# Patient Record
Sex: Male | Born: 1940 | Race: Black or African American | Hispanic: No | Marital: Married | Smoking: Never smoker
Health system: Southern US, Community
[De-identification: ages and names within clinical notes are randomized; demographics above are authoritative.]

## PROBLEM LIST (undated history)

## (undated) DIAGNOSIS — I480 Paroxysmal atrial fibrillation: Secondary | ICD-10-CM

## (undated) DIAGNOSIS — D496 Neoplasm of unspecified behavior of brain: Secondary | ICD-10-CM

## (undated) DIAGNOSIS — Z9889 Other specified postprocedural states: Secondary | ICD-10-CM

## (undated) DIAGNOSIS — Z992 Dependence on renal dialysis: Secondary | ICD-10-CM

## (undated) DIAGNOSIS — G40909 Epilepsy, unspecified, not intractable, without status epilepticus: Secondary | ICD-10-CM

## (undated) DIAGNOSIS — K219 Gastro-esophageal reflux disease without esophagitis: Secondary | ICD-10-CM

## (undated) DIAGNOSIS — C719 Malignant neoplasm of brain, unspecified: Secondary | ICD-10-CM

## (undated) DIAGNOSIS — I739 Peripheral vascular disease, unspecified: Secondary | ICD-10-CM

## (undated) DIAGNOSIS — E785 Hyperlipidemia, unspecified: Secondary | ICD-10-CM

## (undated) DIAGNOSIS — A498 Other bacterial infections of unspecified site: Secondary | ICD-10-CM

## (undated) DIAGNOSIS — N2581 Secondary hyperparathyroidism of renal origin: Secondary | ICD-10-CM

## (undated) DIAGNOSIS — I509 Heart failure, unspecified: Secondary | ICD-10-CM

## (undated) DIAGNOSIS — N186 End stage renal disease: Secondary | ICD-10-CM

## (undated) DIAGNOSIS — D649 Anemia, unspecified: Secondary | ICD-10-CM

## (undated) DIAGNOSIS — I10 Essential (primary) hypertension: Secondary | ICD-10-CM

## (undated) DIAGNOSIS — E118 Type 2 diabetes mellitus with unspecified complications: Secondary | ICD-10-CM

## (undated) DIAGNOSIS — M1990 Unspecified osteoarthritis, unspecified site: Secondary | ICD-10-CM

## (undated) HISTORY — DX: Essential (primary) hypertension: I10

## (undated) HISTORY — DX: Hyperlipidemia, unspecified: E78.5

## (undated) HISTORY — DX: Other bacterial infections of unspecified site: A49.8

## (undated) HISTORY — DX: Anemia, unspecified: D64.9

## (undated) HISTORY — DX: Peripheral vascular disease, unspecified: I73.9

## (undated) HISTORY — PX: RETINOPATHY SURGERY: SHX765

## (undated) HISTORY — DX: Neoplasm of unspecified behavior of brain: D49.6

## (undated) HISTORY — DX: Unspecified osteoarthritis, unspecified site: M19.90

## (undated) HISTORY — DX: Other specified postprocedural states: Z98.890

## (undated) HISTORY — DX: Paroxysmal atrial fibrillation: I48.0

## (undated) HISTORY — PX: AV FISTULA PLACEMENT: SHX1204

## (undated) HISTORY — DX: Epilepsy, unspecified, not intractable, without status epilepticus: G40.909

## (undated) HISTORY — DX: Malignant neoplasm of brain, unspecified: C71.9

## (undated) HISTORY — DX: Heart failure, unspecified: I50.9

---

## 1998-09-15 HISTORY — PX: EYE SURGERY: SHX253

## 2006-12-16 ENCOUNTER — Other Ambulatory Visit: Payer: Self-pay

## 2006-12-16 ENCOUNTER — Inpatient Hospital Stay: Payer: Self-pay | Admitting: *Deleted

## 2007-07-28 ENCOUNTER — Ambulatory Visit: Payer: Self-pay | Admitting: Ophthalmology

## 2007-07-28 ENCOUNTER — Other Ambulatory Visit: Payer: Self-pay

## 2007-07-29 ENCOUNTER — Ambulatory Visit: Payer: Self-pay | Admitting: Cardiology

## 2007-08-04 ENCOUNTER — Ambulatory Visit: Payer: Self-pay | Admitting: Ophthalmology

## 2007-09-16 DIAGNOSIS — G40909 Epilepsy, unspecified, not intractable, without status epilepticus: Secondary | ICD-10-CM

## 2007-09-16 DIAGNOSIS — D496 Neoplasm of unspecified behavior of brain: Secondary | ICD-10-CM

## 2007-09-16 DIAGNOSIS — C719 Malignant neoplasm of brain, unspecified: Secondary | ICD-10-CM

## 2007-09-16 HISTORY — DX: Epilepsy, unspecified, not intractable, without status epilepticus: G40.909

## 2007-09-16 HISTORY — DX: Neoplasm of unspecified behavior of brain: D49.6

## 2007-09-16 HISTORY — DX: Malignant neoplasm of brain, unspecified: C71.9

## 2007-09-16 HISTORY — PX: CRANIECTOMY / CRANIOTOMY FOR EXCISION OF BRAIN TUMOR: SUR320

## 2007-10-02 ENCOUNTER — Other Ambulatory Visit: Payer: Self-pay

## 2007-10-03 ENCOUNTER — Inpatient Hospital Stay: Payer: Self-pay | Admitting: Internal Medicine

## 2010-09-15 DIAGNOSIS — N186 End stage renal disease: Secondary | ICD-10-CM | POA: Diagnosis present

## 2010-12-15 HISTORY — PX: BELOW KNEE LEG AMPUTATION: SUR23

## 2012-08-26 ENCOUNTER — Encounter: Payer: Self-pay | Admitting: Vascular Surgery

## 2012-08-31 ENCOUNTER — Encounter: Payer: Self-pay | Admitting: Vascular Surgery

## 2012-09-01 ENCOUNTER — Encounter: Payer: Self-pay | Admitting: Vascular Surgery

## 2012-09-01 ENCOUNTER — Other Ambulatory Visit (HOSPITAL_COMMUNITY): Payer: Self-pay | Admitting: Nephrology

## 2012-09-01 ENCOUNTER — Ambulatory Visit (INDEPENDENT_AMBULATORY_CARE_PROVIDER_SITE_OTHER): Payer: PRIVATE HEALTH INSURANCE | Admitting: Vascular Surgery

## 2012-09-01 VITALS — BP 123/71 | HR 82 | Resp 16 | Ht 76.0 in | Wt 256.0 lb

## 2012-09-01 DIAGNOSIS — N186 End stage renal disease: Secondary | ICD-10-CM

## 2012-09-01 DIAGNOSIS — I70269 Atherosclerosis of native arteries of extremities with gangrene, unspecified extremity: Secondary | ICD-10-CM

## 2012-09-01 NOTE — Progress Notes (Signed)
Vascular and Vein Specialist of [facility]  Patient name: [name] [name] MRN: [id number] DOB: June 23, 1941 Sex: male  REASON FOR CONSULT: right second toe wound.  HPI: [name] [name] is a 71 y.o. male who presents with a wound on his right second toe. He was referred for vascular consultation. 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 He apparently had osteomyelitis of the left foot. He does not remember any specific injury to the right second toe. He denies any fever or chills. He's had no drainage from the wound. He does state that he had an x-ray approximately 2 months ago which did not show any evidence of osteomyelitis. He is ambulatory with a below the knee prosthesis. He denies any significant claudication on the right and denies any rest pain or previous history of nonhealing ulcers on the right.  Past Medical History  Diagnosis Date  . Dialysis patient   . Peripheral vascular disease   . ESRD (end stage renal disease) on dialysis   . Hypertension   . Hyperlipidemia   . Arthritis     Gout  . Paroxysmal atrial fibrillation   . Brain tumor 2009  . Seizure disorder 2009  . Anemia   . Thyroid disease     hyperparathyroidism, secondary  . Diabetes mellitus without complication     Type II  . Proteus mirabilis infection   . CHF (congestive heart failure)   . CAD (coronary artery disease)   . 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 brain tumor    Family History  Problem Relation Age of Onset  . Seizures Mother   . Transient ischemic attack Mother   . Hypertension Mother   . Alcohol abuse Father   . Hypertension Father   . Arthritis Sister     SOCIAL HISTORY: History  Substance Use Topics  . Smoking status: Never Smoker   . Smokeless tobacco: Never Used  . Alcohol Use: 0.6 oz/week    1 Shots of liquor per week    No Known Allergies  Current Outpatient Prescriptions  Medication Sig  Dispense Refill  . phenytoin (DILANTIN) 50 MG tablet Chew 50 mg by mouth 3 (three) times daily.      Marland Kitchen amLODipine (NORVASC) 10 MG tablet Take 10 mg by mouth daily.      Marland Kitchen aspirin 81 MG tablet Take 81 mg by mouth daily.      . Calcium Acetate-Magnesium Carb 450-200 MG TABS Take by mouth.      . carvedilol (COREG) 12.5 MG tablet Take 12.5 mg by mouth 2 (two) times daily with a meal.      . cyclobenzaprine (FLEXERIL) 5 MG tablet Take 5 mg by mouth 3 (three) times daily as needed.      Marland Kitchen FOLIC ACID PO Take 1 mg by mouth.      . insulin NPH-insulin regular (NOVOLIN 50/50) (50-50) 100 UNIT/ML injection Inject into the skin 2 (two) times daily before a meal.      . lisinopril (PRINIVIL,ZESTRIL) 10 MG tablet Take 10 mg by mouth daily.      . multivitamin (RENA-VIT) TABS tablet Take 1 tablet by mouth daily.      . pantoprazole (PROTONIX) 40 MG tablet Take 40 mg by mouth daily.        REVIEW OF SYSTEMS: Arly.Keller ] denotes positive finding; [  ] denotes negative finding  CARDIOVASCULAR:  [ ]  chest pain   [ ]   chest pressure   [ ]  palpitations   [ ]  orthopnea   [ ]  dyspnea on exertion   [ ]  claudication   [ ]  rest pain   [ ]  DVT   [ ]  phlebitis PULMONARY:   [ ]  productive cough   [ ]  asthma   [ ]  wheezing NEUROLOGIC:   [ ]  weakness  [ ]  paresthesias  [ ]  aphasia  [ ]  amaurosis  [ ]  dizziness HEMATOLOGIC:   [ ]  bleeding problems   [ ]  clotting disorders MUSCULOSKELETAL:  [ ]  joint pain   [ ]  joint swelling Arly.[email or web address] ] leg swelling right leg GASTROINTESTINAL: [ ]   blood in stool  [ ]   hematemesis GENITOURINARY:  [ ]   dysuria  [ ]   hematuria PSYCHIATRIC:  [ ]  history of major depression INTEGUMENTARY:  [ ]  rashes  [ ]  ulcers CONSTITUTIONAL:  [ ]  fever   [ ]  chills  PHYSICAL EXAM: Filed Vitals:   09/01/12 1127  BP: 123/71  Pulse: 82  Resp: 16  Height: 6\' 4"  (1.93 m)  Weight: 255 lb 15.3 oz (116.1 kg)  SpO2: 100%   Body mass index is 31.16 kg/(m^2). GENERAL: The patient is a well-nourished male, in no acute  distress. The vital signs are documented above. CARDIOVASCULAR: There is a regular rate and rhythm. I do not detect carotid bruits. He has palpable femoral pulses. He has a palpable right popliteal pulse. I cannot palpate pedal pulses on the right. His left upper arm fistula has a bruit and thrill. He has a diminished but palpable left radial pulse. PULMONARY: There is good air exchange bilaterally without wheezing or rales. ABDOMEN: Soft and non-tender with normal pitched bowel sounds.  MUSCULOSKELETAL: he has a prosthesis on his left below the knee amputation site. NEUROLOGIC: No focal weakness or paresthesias are detected. SKIN: there is a superficial ulcer on the dorsum of the right second toe. There is no significant erythema or drainage noted. PSYCHIATRIC: The patient has a normal affect.  DATA:  I have reviewed his Doppler studies which were done in November in [facility]. This showed triphasic waveforms throughout the entire right lower extremity including the anterior tibial and posterior tibial arteries. He had calcific vessels and therefore ABIs were not obtainable however toe pressure on the right was 83 mmHg.  I have also reviewed his records from [facility]. He has end-stage renal disease and dialyzes on Tuesdays Thursdays and Saturdays. He scheduled for a fistulogram to a value in his left upper arm fistula in the near future. Also has a history of paroxysmal atrial for ablation and is on low-dose aspirin.  MEDICAL ISSUES: This patient has a superficial wound on the right second toe. Currently there is no significant erythema or drainage to suggest underlying osteomyelitis. Based on his Doppler study in November he should have adequate circulation to heal this wound. Typically, even in a patient with diabetes, a toe pressure of greater than 50 is adequate for healing. This toe pressure on the right is 83 mmHg. In addition he has triphasic Doppler signals in  the anterior tibial and posterior tibial arteries on the right. I would continue with aggressive wound care. At this point I would not recommend arteriography given that I think he has adequate circulation to heal this wound. I be happy to see him back if there is any change in the wound her failure to heal. Fortunately he is not a smoker.   Morrigan Wickens S [facility] Specialists of [facility] [facility]: [phone]

## 2012-09-03 ENCOUNTER — Other Ambulatory Visit (HOSPITAL_COMMUNITY): Payer: Self-pay | Admitting: Nephrology

## 2012-09-03 ENCOUNTER — Ambulatory Visit (HOSPITAL_COMMUNITY)
Admission: RE | Admit: 2012-09-03 | Discharge: 2012-09-03 | Disposition: A | Discharge status: Home / Self Care | Payer: Medicare Other | Source: Ambulatory Visit | Attending: Nephrology | Admitting: Nephrology

## 2012-09-03 DIAGNOSIS — Z992 Dependence on renal dialysis: Secondary | ICD-10-CM | POA: Insufficient documentation

## 2012-09-03 DIAGNOSIS — I2510 Atherosclerotic heart disease of native coronary artery without angina pectoris: Secondary | ICD-10-CM | POA: Insufficient documentation

## 2012-09-03 DIAGNOSIS — Y849 Medical procedure, unspecified as the cause of abnormal reaction of the patient, or of later complication, without mention of misadventure at the time of the procedure: Secondary | ICD-10-CM | POA: Insufficient documentation

## 2012-09-03 DIAGNOSIS — I120 Hypertensive chronic kidney disease with stage 5 chronic kidney disease or end stage renal disease: Secondary | ICD-10-CM | POA: Insufficient documentation

## 2012-09-03 DIAGNOSIS — T82898A Other specified complication of vascular prosthetic devices, implants and grafts, initial encounter: Secondary | ICD-10-CM | POA: Insufficient documentation

## 2012-09-03 DIAGNOSIS — I4891 Unspecified atrial fibrillation: Secondary | ICD-10-CM | POA: Insufficient documentation

## 2012-09-03 DIAGNOSIS — N186 End stage renal disease: Secondary | ICD-10-CM

## 2012-09-03 DIAGNOSIS — E785 Hyperlipidemia, unspecified: Secondary | ICD-10-CM | POA: Insufficient documentation

## 2012-09-03 DIAGNOSIS — E119 Type 2 diabetes mellitus without complications: Secondary | ICD-10-CM | POA: Insufficient documentation

## 2012-09-03 MED ORDER — IOHEXOL 300 MG/ML  SOLN
100.0000 mL | Freq: Once | INTRAMUSCULAR | Status: AC | PRN
  Administered 2012-09-03: 60 mL via INTRAVENOUS

## 2012-09-03 NOTE — Procedures (Signed)
Successful balloon angioplasty of left cephalic vein stenosis.  No immediate complication.  See Radiology report.

## 2012-09-03 NOTE — H&P (Signed)
[name] [name] is an 71 y.o. male.   Chief Complaint: Poor flows from left arm dialysis fistula HPI: ESRD and poor flows from left arm fistula.  No complaints.  Here for fistulogram and treatment.  Past Medical History  Diagnosis Date  . Dialysis patient   . Peripheral vascular disease   . ESRD (end stage renal disease) on dialysis   . Hypertension   . Hyperlipidemia   . Arthritis     Gout  . Paroxysmal atrial fibrillation   . Brain tumor 2009  . Seizure disorder 2009  . Anemia   . Thyroid disease     hyperparathyroidism, secondary  . Diabetes mellitus without complication     Type II  . Proteus mirabilis infection   . CHF (congestive heart failure)   . CAD (coronary artery disease)   . 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 brain tumor    Past Surgical History  Procedure Date  . Av fistula placement   . Below knee leg amputation April 2012    Left below knee amputation for Osteomyelitis  . Eye surgery     cataract  . Retinopathy surgery     Hx. of laser treatments for diabetics    Family History  Problem Relation Age of Onset  . Seizures Mother   . Transient ischemic attack Mother   . Hypertension Mother   . Alcohol abuse Father   . Hypertension Father   . Arthritis Sister    Social History:  reports that he has never smoked. He has never used smokeless tobacco. He reports that he drinks about .6 ounces of alcohol per week. He reports that he does not use illicit drugs.  Allergies: No Known Allergies  Filed Vitals:   09/03/12 1300  BP: 133/75  Pulse: 77  Resp: 19    Review of Systems  Constitutional: Negative.   Respiratory: Negative.   Cardiovascular: Negative.   Gastrointestinal: Negative.   Genitourinary: Negative.     Blood pressure 133/75, pulse 77, resp. rate 19, SpO2 99.00%. Physical Exam  Constitutional: He appears well-developed.  Respiratory: Effort normal and breath sounds normal.   Heart:  Irregular heart rate consistent with atrial fibrillation. Left arm:  Fistula is patent.    Imaging:  Focal venous stenosis in upper cephalic vein.   Assessment/Plan Fistulogram demonstrated focal narrowing in left upper cephalic vein.  Discussed angioplasty and possible stent placement with patient.  Informed consent was obtained.  Plan for intervention without sedation.  Cheyenna Pankowski RYAN 09/03/2012, 2:32 PM

## 2012-09-06 ENCOUNTER — Telehealth (HOSPITAL_COMMUNITY): Payer: Self-pay | Admitting: *Deleted

## 2012-12-17 ENCOUNTER — Other Ambulatory Visit (HOSPITAL_COMMUNITY): Payer: Self-pay | Admitting: Nephrology

## 2012-12-17 DIAGNOSIS — N186 End stage renal disease: Secondary | ICD-10-CM

## 2012-12-22 ENCOUNTER — Ambulatory Visit (HOSPITAL_COMMUNITY): Admission: RE | Admit: 2012-12-22 | Payer: PRIVATE HEALTH INSURANCE | Source: Ambulatory Visit

## 2012-12-24 ENCOUNTER — Other Ambulatory Visit (HOSPITAL_COMMUNITY): Payer: Self-pay | Admitting: Diagnostic Radiology

## 2012-12-24 ENCOUNTER — Ambulatory Visit (HOSPITAL_COMMUNITY)
Admission: RE | Admit: 2012-12-24 | Discharge: 2012-12-24 | Disposition: A | Discharge status: Home / Self Care | Payer: Medicare Other | Source: Ambulatory Visit | Attending: Nephrology | Admitting: Nephrology

## 2012-12-24 ENCOUNTER — Other Ambulatory Visit (HOSPITAL_COMMUNITY): Payer: Self-pay | Admitting: Nephrology

## 2012-12-24 DIAGNOSIS — T82898A Other specified complication of vascular prosthetic devices, implants and grafts, initial encounter: Secondary | ICD-10-CM | POA: Insufficient documentation

## 2012-12-24 DIAGNOSIS — E785 Hyperlipidemia, unspecified: Secondary | ICD-10-CM | POA: Insufficient documentation

## 2012-12-24 DIAGNOSIS — I120 Hypertensive chronic kidney disease with stage 5 chronic kidney disease or end stage renal disease: Secondary | ICD-10-CM | POA: Insufficient documentation

## 2012-12-24 DIAGNOSIS — Y832 Surgical operation with anastomosis, bypass or graft as the cause of abnormal reaction of the patient, or of later complication, without mention of misadventure at the time of the procedure: Secondary | ICD-10-CM | POA: Insufficient documentation

## 2012-12-24 DIAGNOSIS — N186 End stage renal disease: Secondary | ICD-10-CM

## 2012-12-24 DIAGNOSIS — Z794 Long term (current) use of insulin: Secondary | ICD-10-CM | POA: Insufficient documentation

## 2012-12-24 DIAGNOSIS — G40909 Epilepsy, unspecified, not intractable, without status epilepticus: Secondary | ICD-10-CM | POA: Insufficient documentation

## 2012-12-24 DIAGNOSIS — I509 Heart failure, unspecified: Secondary | ICD-10-CM | POA: Insufficient documentation

## 2012-12-24 DIAGNOSIS — E119 Type 2 diabetes mellitus without complications: Secondary | ICD-10-CM | POA: Insufficient documentation

## 2012-12-24 DIAGNOSIS — I2510 Atherosclerotic heart disease of native coronary artery without angina pectoris: Secondary | ICD-10-CM | POA: Insufficient documentation

## 2012-12-24 DIAGNOSIS — N2581 Secondary hyperparathyroidism of renal origin: Secondary | ICD-10-CM | POA: Insufficient documentation

## 2012-12-24 DIAGNOSIS — I82619 Acute embolism and thrombosis of superficial veins of unspecified upper extremity: Secondary | ICD-10-CM | POA: Insufficient documentation

## 2012-12-24 DIAGNOSIS — I739 Peripheral vascular disease, unspecified: Secondary | ICD-10-CM | POA: Insufficient documentation

## 2012-12-24 DIAGNOSIS — Z79899 Other long term (current) drug therapy: Secondary | ICD-10-CM | POA: Insufficient documentation

## 2012-12-24 DIAGNOSIS — S88119A Complete traumatic amputation at level between knee and ankle, unspecified lower leg, initial encounter: Secondary | ICD-10-CM | POA: Insufficient documentation

## 2012-12-24 DIAGNOSIS — D649 Anemia, unspecified: Secondary | ICD-10-CM | POA: Insufficient documentation

## 2012-12-24 DIAGNOSIS — Z7982 Long term (current) use of aspirin: Secondary | ICD-10-CM | POA: Insufficient documentation

## 2012-12-24 MED ORDER — IOHEXOL 300 MG/ML  SOLN
100.0000 mL | Freq: Once | INTRAMUSCULAR | Status: AC | PRN
  Administered 2012-12-24: 8 mL via INTRAVENOUS

## 2012-12-24 NOTE — H&P (Signed)
[name] [name] is an 72 y.o. male.   Chief Complaint: decreased access flows/clotted left arm dialysis fistula HPI: Patient with ESRD and left arm fistula with thrombosis of venous limb noted on 12/24/12 fistulagram . He is now scheduled for thrombolysis with possible angioplasty/stenting of fistula or placement of new catheter if needed on 12/25/12.  Past Medical History  Diagnosis Date  . Dialysis patient   . Peripheral vascular disease   . ESRD (end stage renal disease) on dialysis   . Hypertension   . Hyperlipidemia   . Arthritis     Gout  . Paroxysmal atrial fibrillation   . Brain tumor 2009  . Seizure disorder 2009  . Anemia   . Thyroid disease     hyperparathyroidism, secondary  . Diabetes mellitus without complication     Type II  . Proteus mirabilis infection   . CHF (congestive heart failure)   . CAD (coronary artery disease)   . 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 brain tumor  [employer]H'S WITNESS  Past Surgical History  Procedure Laterality Date  . Av fistula placement    . Below knee leg amputation  April 2012    Left below knee amputation for Osteomyelitis  . Eye surgery      cataract  . Retinopathy surgery      Hx. of laser treatments for diabetics    Family History  Problem Relation Age of Onset  . Seizures Mother   . Transient ischemic attack Mother   . Hypertension Mother   . Alcohol abuse Father   . Hypertension Father   . Arthritis Sister    Social History:  reports that he has never smoked. He has never used smokeless tobacco. He reports that he drinks about 0.6 ounces of alcohol per week. He reports that he does not use illicit drugs.  Allergies: No Known Allergies Current outpatient prescriptions:amLODipine (NORVASC) 10 MG tablet, Take 10 mg by mouth daily., Disp: , Rfl: ;  aspirin 81 MG tablet, Take 81 mg by mouth daily., Disp: , Rfl: ;  Calcium Acetate-Magnesium Carb 450-200 MG TABS, Take by  mouth., Disp: , Rfl: ;  carvedilol (COREG) 12.5 MG tablet, Take 12.5 mg by mouth 2 (two) times daily with a meal., Disp: , Rfl:  cyclobenzaprine (FLEXERIL) 5 MG tablet, Take 5 mg by mouth 3 (three) times daily as needed., Disp: , Rfl: ;  FOLIC ACID PO, Take 1 mg by mouth., Disp: , Rfl: ;  insulin NPH-insulin regular (NOVOLIN 50/50) (50-50) 100 UNIT/ML injection, Inject into the skin 2 (two) times daily before a meal., Disp: , Rfl: ;  lisinopril (PRINIVIL,ZESTRIL) 10 MG tablet, Take 10 mg by mouth daily., Disp: , Rfl:  multivitamin (RENA-VIT) TABS tablet, Take 1 tablet by mouth daily., Disp: , Rfl: ;  pantoprazole (PROTONIX) 40 MG tablet, Take 40 mg by mouth daily., Disp: , Rfl: ;  phenytoin (DILANTIN) 50 MG tablet, Chew 50 mg by mouth 3 (three) times daily., Disp: , Rfl:     No results found for this or any previous visit (from the past 48 hour(s)). No results found.  Review of Systems  Constitutional: Negative for fever and chills.  Respiratory: Negative for cough and shortness of breath.   Cardiovascular: Negative for chest pain.  Gastrointestinal: Negative for nausea, vomiting and abdominal pain.  Musculoskeletal: Negative for back pain.  Neurological: Negative for headaches.  Endo/Heme/Allergies: Does not bruise/bleed easily.    There were  no vitals taken for this visit. Physical Exam  Constitutional: He is oriented to person, place, and time. He appears well-developed and well-nourished.  Cardiovascular:  irreg  irreg; left arm AVF with positive thrill/bruit proximal portion ( arterial anast patent)   Respiratory: Effort normal.  Few bibasilar crackles  GI: Soft. Bowel sounds are normal. There is no tenderness.  Musculoskeletal: He exhibits no edema.  Left BKA  Neurological: He is alert and oriented to person, place, and time.     Assessment/Plan Pt with ESRD and thrombosed venous limb of left arm AVF. Plan is for thrombolysis, possible angioplasty/stenting of fistula or  placement of new catheter if needed on 12/25/12. Details/risks of procedure d/w pt/wife with their understanding and consent. Pt is a Jehovah's witness.   Nicodemus Denk,D KEVIN 12/24/2012, 3:34 PM

## 2012-12-25 ENCOUNTER — Other Ambulatory Visit (HOSPITAL_COMMUNITY): Payer: Self-pay | Admitting: Diagnostic Radiology

## 2012-12-25 ENCOUNTER — Ambulatory Visit (HOSPITAL_COMMUNITY)
Admission: RE | Admit: 2012-12-25 | Discharge: 2012-12-25 | Disposition: A | Discharge status: Home / Self Care | Payer: Medicare Other | Source: Ambulatory Visit | Attending: Diagnostic Radiology | Admitting: Diagnostic Radiology

## 2012-12-25 ENCOUNTER — Other Ambulatory Visit (HOSPITAL_COMMUNITY): Payer: Self-pay | Admitting: Nephrology

## 2012-12-25 ENCOUNTER — Other Ambulatory Visit (HOSPITAL_COMMUNITY): Payer: PRIVATE HEALTH INSURANCE

## 2012-12-25 DIAGNOSIS — I82619 Acute embolism and thrombosis of superficial veins of unspecified upper extremity: Secondary | ICD-10-CM | POA: Insufficient documentation

## 2012-12-25 DIAGNOSIS — N186 End stage renal disease: Secondary | ICD-10-CM

## 2012-12-25 DIAGNOSIS — T82898A Other specified complication of vascular prosthetic devices, implants and grafts, initial encounter: Secondary | ICD-10-CM | POA: Insufficient documentation

## 2012-12-25 DIAGNOSIS — I871 Compression of vein: Secondary | ICD-10-CM | POA: Insufficient documentation

## 2012-12-25 DIAGNOSIS — Y832 Surgical operation with anastomosis, bypass or graft as the cause of abnormal reaction of the patient, or of later complication, without mention of misadventure at the time of the procedure: Secondary | ICD-10-CM | POA: Insufficient documentation

## 2012-12-25 LAB — POTASSIUM: Potassium: 3.8 mEq/L (ref 3.5–5.1)

## 2012-12-25 MED ORDER — HEPARIN SODIUM (PORCINE) 1000 UNIT/ML IJ SOLN
INTRAMUSCULAR | Status: AC
  Administered 2012-12-25: 5000 [IU]
  Filled 2012-12-25: qty 1

## 2012-12-25 MED ORDER — MIDAZOLAM HCL 2 MG/2ML IJ SOLN
INTRAMUSCULAR | Status: AC | PRN
  Administered 2012-12-25 (×3): 1 mg via INTRAVENOUS

## 2012-12-25 MED ORDER — FENTANYL CITRATE 0.05 MG/ML IJ SOLN
INTRAMUSCULAR | Status: AC
  Filled 2012-12-25: qty 2

## 2012-12-25 MED ORDER — FENTANYL CITRATE 0.05 MG/ML IJ SOLN
INTRAMUSCULAR | Status: AC | PRN
  Administered 2012-12-25 (×4): 25 ug via INTRAVENOUS

## 2012-12-25 MED ORDER — IOHEXOL 300 MG/ML  SOLN
100.0000 mL | Freq: Once | INTRAMUSCULAR | Status: AC | PRN
  Administered 2012-12-25: 100 mL via INTRAVENOUS

## 2012-12-25 MED ORDER — MIDAZOLAM HCL 2 MG/2ML IJ SOLN
INTRAMUSCULAR | Status: AC
  Filled 2012-12-25: qty 2

## 2012-12-25 MED ORDER — ALTEPLASE 100 MG IV SOLR
2.0000 mg | Freq: Once | INTRAVENOUS | Status: AC
  Administered 2012-12-25: 2 mg
  Filled 2012-12-25 (×2): qty 2

## 2012-12-25 NOTE — Procedures (Signed)
Successful declot of the left upper arm fistula.  See radiology report.  No immediate complication.

## 2012-12-29 ENCOUNTER — Ambulatory Visit (HOSPITAL_COMMUNITY): Payer: PRIVATE HEALTH INSURANCE

## 2013-01-05 ENCOUNTER — Ambulatory Visit: Payer: Medicare Other | Admitting: Physical Therapy

## 2013-01-05 ENCOUNTER — Ambulatory Visit: Payer: Medicare Other | Attending: Nephrology | Admitting: Occupational Therapy

## 2013-01-05 ENCOUNTER — Ambulatory Visit: Payer: Medicare Other | Admitting: Rehabilitative and Restorative Service Providers"

## 2013-01-05 DIAGNOSIS — R279 Unspecified lack of coordination: Secondary | ICD-10-CM | POA: Insufficient documentation

## 2013-01-05 DIAGNOSIS — Z5189 Encounter for other specified aftercare: Secondary | ICD-10-CM | POA: Insufficient documentation

## 2013-01-05 DIAGNOSIS — R269 Unspecified abnormalities of gait and mobility: Secondary | ICD-10-CM | POA: Insufficient documentation

## 2013-01-05 DIAGNOSIS — R5381 Other malaise: Secondary | ICD-10-CM | POA: Insufficient documentation

## 2013-01-07 ENCOUNTER — Ambulatory Visit: Payer: Medicare Other | Admitting: *Deleted

## 2013-01-10 ENCOUNTER — Ambulatory Visit: Payer: PRIVATE HEALTH INSURANCE | Admitting: Internal Medicine

## 2013-01-10 DIAGNOSIS — Z0289 Encounter for other administrative examinations: Secondary | ICD-10-CM

## 2013-01-12 ENCOUNTER — Ambulatory Visit: Payer: PRIVATE HEALTH INSURANCE | Admitting: Rehabilitative and Restorative Service Providers"

## 2013-01-12 ENCOUNTER — Encounter: Payer: PRIVATE HEALTH INSURANCE | Admitting: Occupational Therapy

## 2013-01-12 ENCOUNTER — Ambulatory Visit: Payer: Medicare Other | Admitting: Physical Therapy

## 2013-01-14 ENCOUNTER — Ambulatory Visit: Payer: Medicare Other | Admitting: Occupational Therapy

## 2013-01-14 ENCOUNTER — Encounter: Payer: Self-pay | Admitting: Physical Therapy

## 2013-01-17 ENCOUNTER — Ambulatory Visit: Payer: Medicare Other | Attending: Nephrology | Admitting: Physical Therapy

## 2013-01-17 DIAGNOSIS — R279 Unspecified lack of coordination: Secondary | ICD-10-CM | POA: Insufficient documentation

## 2013-01-17 DIAGNOSIS — R5381 Other malaise: Secondary | ICD-10-CM | POA: Insufficient documentation

## 2013-01-17 DIAGNOSIS — R269 Unspecified abnormalities of gait and mobility: Secondary | ICD-10-CM | POA: Insufficient documentation

## 2013-01-17 DIAGNOSIS — Z5189 Encounter for other specified aftercare: Secondary | ICD-10-CM | POA: Insufficient documentation

## 2013-01-21 ENCOUNTER — Ambulatory Visit: Payer: Medicare Other | Admitting: *Deleted

## 2013-01-24 ENCOUNTER — Ambulatory Visit: Payer: Medicare Other | Admitting: Physical Therapy

## 2013-01-28 ENCOUNTER — Encounter: Payer: Self-pay | Admitting: Physical Therapy

## 2013-01-28 ENCOUNTER — Ambulatory Visit: Payer: Medicare Other | Admitting: Physical Therapy

## 2013-01-31 ENCOUNTER — Ambulatory Visit: Payer: Medicare Other | Admitting: Physical Therapy

## 2013-02-01 ENCOUNTER — Telehealth: Payer: Self-pay

## 2013-02-01 NOTE — Telephone Encounter (Signed)
Message copied by Doree Barthel on Tue Feb 01, 2013  1:50 PM ------      Message from: Janey Greaser      Created: Mon Jan 17, 2013  3:56 PM      Contact: PT CAME TO OFFICE                   ----- Message -----         From: Esmond Harps         Sent: 01/17/2013   3:10 PM           To: [facility] Clinical Pool            PT HAS APPT WITH DR. Marjory Lies 05-02-13 BUT NEEDS SOONER APPT TO DISCUSS MRI RESULTS--[phone]) ------

## 2013-02-01 NOTE — Telephone Encounter (Signed)
Pt requesting office visit to go over MRI's previously done @ [facility].

## 2013-02-02 NOTE — Telephone Encounter (Signed)
Called pt to sched OV on  02/09/13 @ 12pm. No answer, left vmail appt scheduled.

## 2013-02-04 ENCOUNTER — Ambulatory Visit: Payer: Medicare Other | Admitting: Physical Therapy

## 2013-02-09 ENCOUNTER — Telehealth: Payer: Self-pay

## 2013-02-09 ENCOUNTER — Encounter: Payer: Self-pay | Admitting: Diagnostic Neuroimaging

## 2013-02-09 ENCOUNTER — Ambulatory Visit (INDEPENDENT_AMBULATORY_CARE_PROVIDER_SITE_OTHER): Payer: Medicare Other | Admitting: Diagnostic Neuroimaging

## 2013-02-09 VITALS — BP 143/76 | HR 73 | Temp 100.4°F | Ht 76.0 in | Wt 256.0 lb

## 2013-02-09 DIAGNOSIS — G40909 Epilepsy, unspecified, not intractable, without status epilepticus: Secondary | ICD-10-CM

## 2013-02-09 DIAGNOSIS — C712 Malignant neoplasm of temporal lobe: Secondary | ICD-10-CM

## 2013-02-09 DIAGNOSIS — G40209 Localization-related (focal) (partial) symptomatic epilepsy and epileptic syndromes with complex partial seizures, not intractable, without status epilepticus: Secondary | ICD-10-CM

## 2013-02-09 NOTE — Telephone Encounter (Signed)
Noted. Will follow up dilantin level. -VRP

## 2013-02-09 NOTE — Patient Instructions (Signed)
Will check labs and then consider medication adjustment.  I will check EEG.

## 2013-02-09 NOTE — Telephone Encounter (Signed)
Pt says rx directions are Dilantin 50mg  6 tabs in the a.m; and 5 tabs in evening.  He has been taking 10 tabs in a.m.

## 2013-02-10 ENCOUNTER — Telehealth: Payer: Self-pay | Admitting: Diagnostic Neuroimaging

## 2013-02-10 DIAGNOSIS — G40209 Localization-related (focal) (partial) symptomatic epilepsy and epileptic syndromes with complex partial seizures, not intractable, without status epilepticus: Secondary | ICD-10-CM | POA: Insufficient documentation

## 2013-02-10 LAB — COMPREHENSIVE METABOLIC PANEL
Albumin: 4.2 g/dL (ref 3.5–4.8)
BUN: 25 mg/dL (ref 8–27)
CO2: 28 mmol/L (ref 19–28)
Calcium: 9.4 mg/dL (ref 8.6–10.2)
Chloride: 95 mmol/L — ABNORMAL LOW (ref 96–108)
Glucose: 137 mg/dL — ABNORMAL HIGH (ref 65–99)
Total Protein: 6.8 g/dL (ref 6.0–8.5)

## 2013-02-10 LAB — CBC WITH DIFFERENTIAL/PLATELET
Eosinophils Absolute: 0.2 10*3/uL (ref 0.0–0.4)
MCH: 31.8 pg (ref 26.6–33.0)
MCV: 93 fL (ref 79–97)
Monocytes Absolute: 0.9 10*3/uL (ref 0.1–0.9)
Neutrophils Relative %: 54 % (ref 40–74)
RDW: 13.7 % (ref 12.3–15.4)
WBC: 8 10*3/uL (ref 3.4–10.8)

## 2013-02-10 MED ORDER — DIVALPROEX SODIUM ER 500 MG PO TB24: 1000.0000 mg | ORAL_TABLET | Freq: Every day | ORAL | Status: DC

## 2013-02-10 NOTE — Telephone Encounter (Signed)
I called pts wife. He is taking dilantin 50mg  tabs (10 tabs every morning = 500mg  daily), but level is only 2.4.   Therefore, I will start divalproex ER 500mg  daily x 1 week, then increase to 1000mg  daily. At the same time, he is to reduce dilantin from 500mg  to 250mg  daily x 1 week, then reduce by 50mg  per week until dilantin is stopped.  Pts wife understands plan.    Suanne Marker, MD 02/10/2013, 10:54 AM Certified in Neurology, Neurophysiology and Neuroimaging  [facility] [address] [place], [place] [address] [phone]

## 2013-02-10 NOTE — Progress Notes (Signed)
[facility]  PATIENT: [name] [name] DOB: 03/31/1941  REFERRING CLINICIAN:  HISTORY FROM: patient and wife REASON FOR VISIT: follow up   HISTORICAL  CHIEF COMPLAINT:  Chief Complaint  Patient presents with  . Follow-up    would like to discuss MRI    HISTORY OF PRESENT ILLNESS:   UPDATE 02/09/13: Since last visit, still having 3-4 episodes per week, with slurred speech, random jerking movements, but cannot talk, but able to hear and stay awake. Episodes do not seem to be correlated with dialysis timing. Patient did not bring his medicines for me to review. Is not sure about his Dilantin dosing, but he thinks he takes 50 mg tablets, 10 tablets every morning. Also he went to [facility] and had follow up MRI brain on 11/10/12, which shows stable right temporal post surgical changes, and is stable from MRI on 01/07/11. He did not see them in clinic.  Also, I received prior records, which I summarize as follows: Patient had 2 focal seizures in January 2009. These initially consisted of feeling off balance, disorientation, difficulty speaking, left-sided numbness. He underwent stereotactic biopsy on (10/11/07, Dr. Velora Heckler, [facility]), demonstrating a well-differentiated astrocytoma.  Patient was then referred to [facility] for definitive treatment. He underwent right temporal lobe tumor resection (11/09/07, Dr. Garnette Czech, [facility]) and pathology demonstrated oligoastrocytoma. He then followed up at [facility] tumor [facility] on 12/29/07.  Additional pathology studies: Ki-67 4%, MGMT 25% (MGMT promoter methylation negative), 1p intact, 19q loss. No further radiation or chemotherapy was suggested. Followup MRIs were recommended over time. He follow up in 2010 multiple times with stable MRIs, then not again in [facility] clinic until 07/15/12. Last 2 MRIs were 01/07/11 and 11/10/12, which have been stable. Has not been back to [facility] brain tumor center for clinic evaluation since 07/15/12.  PRIOR HPI  (10/22/12): 72 year old right-handed male with history of diabetes, paroxysmal atrial fibrillation, gout, end-stage renal disease on hemodialysis, depression, anxiety, brain tumor (patient not sure what type) here for evaluation of slurred speech trouble talking, trouble swallowing and being in the ears. Patient is here with his wife.  Patient previously lived in [facility], treated at [facility] for a brain tumor. He points to the right frontal region of his head. Is not sure if this was a benign or malignant tumor. Patient states that he was having left leg weakness problems, diagnosed with a brain tumor. Apparently this was treated at [facility] with biopsy and ultimately resection. He does not recall chemotherapy or radiation treatments. He has had followup scans and visits at [facility] with neurosurgery and neurology but does not know with whom. Last MRI scan was in April 2012. Last visit with neurologist was 4 months ago.  Patient also reports history of seizures, around the time of diagnosis of brain tumor. Patient describes seizures as episodes where he is unable to talk, foaming at the mouth, but awake, aware, able to hear. No convulsions. Patient has been treated with Dilantin. He reports taking 10 tablets of Dilantin the morning and 5 tablets at night. This is contradictory to his referring notes where he is apparently on 100 mg 3 times a day. Patient is not sure of the strength of his tablets.  REVIEW OF SYSTEMS: Full 14 system review of systems performed and notable only for fatigue hearing loss ringing in the ears trouble swallowing itching constipation incontinence impotence cramps snoring eye pain blurred vision memory loss confusion and headache difficulty swallowing tremor restless legs snoring insomnia depression  anxiety too much sleep decreased energy hallucination racing but just interest in activities.  ALLERGIES: No Known Allergies  HOME MEDICATIONS: Outpatient Prescriptions  Prior to Visit  Medication Sig Dispense Refill  . aspirin 81 MG tablet Take 81 mg by mouth daily.      . multivitamin (RENA-VIT) TABS tablet Take 1 tablet by mouth every other day.       . phenytoin (DILANTIN) 50 MG tablet Chew 500 mg by mouth every morning.       Marland Kitchen amLODipine (NORVASC) 10 MG tablet Take 10 mg by mouth daily.      . Calcium Acetate-Magnesium Carb 450-200 MG TABS Take by mouth.      . carvedilol (COREG) 12.5 MG tablet Take 12.5 mg by mouth 2 (two) times daily with a meal.      . cyclobenzaprine (FLEXERIL) 5 MG tablet Take 5 mg by mouth 3 (three) times daily as needed.      Marland Kitchen FOLIC ACID PO Take 1 mg by mouth.      . insulin NPH-insulin regular (NOVOLIN 50/50) (50-50) 100 UNIT/ML injection Inject into the skin 2 (two) times daily before a meal.      . lisinopril (PRINIVIL,ZESTRIL) 10 MG tablet Take 10 mg by mouth daily.      . pantoprazole (PROTONIX) 40 MG tablet Take 40 mg by mouth daily.       No facility-administered medications prior to visit.    PAST MEDICAL HISTORY: Past Medical History  Diagnosis Date  . Dialysis patient   . Peripheral vascular disease   . ESRD (end stage renal disease) on dialysis   . Hypertension   . Hyperlipidemia   . Arthritis     Gout  . Paroxysmal atrial fibrillation   . Brain tumor 2009  . Seizure disorder 2009  . Anemia   . Thyroid disease     hyperparathyroidism, secondary  . Diabetes mellitus without complication     Type II  . Proteus mirabilis infection   . CHF (congestive heart failure)   . CAD (coronary artery disease)   . 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 brain tumor    PAST SURGICAL HISTORY: Past Surgical History  Procedure Laterality Date  . Av fistula placement    . Below knee leg amputation  April 2012    Left below knee amputation for Osteomyelitis  . Eye surgery      cataract  . Retinopathy surgery      Hx. of laser treatments for diabetics    FAMILY  HISTORY: Family History  Problem Relation Age of Onset  . Seizures Mother   . Transient ischemic attack Mother   . Hypertension Mother   . Alcohol abuse Father   . Hypertension Father   . Arthritis Sister     SOCIAL HISTORY:  History   Social History  . Marital Status: Married    Spouse Name: Santina Evans    Number of Children: 3  . Years of Education: 12th   Occupational History  . Not on file.   Social History Main Topics  . Smoking status: Never Smoker   . Smokeless tobacco: Never Used  . Alcohol Use: 0.6 oz/week    1 Shots of liquor per week     Comment: 2 beers daily  . Drug Use: No  . Sexually Active: Not on file   Other Topics Concern  . Not on file   Social History Narrative  Pt lives at home with spouse.   Caffeine Use: 1-2 cups daily.     PHYSICAL EXAM  Filed Vitals:   02/09/13 1242  BP: 143/76  Pulse: 73  Temp: 100.4 F (38 C)  TempSrc: Oral  Height: 6\' 4"  (1.93 m)  Weight: 256 lb (116.121 kg)    Not recorded    Body mass index is 31.17 kg/(m^2).  EXAM: General: Patient is awake, alert and in no acute distress.  Well developed and groomed. Cardiovascular: No carotid artery bruits.  Heart is regular rate and rhythm with no murmurs.  Neurologic Exam  Mental Status: Awake, alert. Language is fluent and comprehension intact. Cranial Nerves: No evidence of papilledema on funduscopic exam.  Pupils are equal and reactive to light.  Visual fields are full to confrontation, EXCEPT LEFT SUPERIOR QUADRANTANOPSIA. Conjugate eye movements are full and symmetric.  Facial sensation and strength are symmetric.  Hearing is intact.  Palate elevated symmetrically and uvula is midline.  Shoulder shrug is symmetric.  Tongue is midline. Motor: Normal bulk and tone.  Full strength in the upper and lower extremities.  LEFT BELOW KNEE AMPUTATION. No pronator drift. Sensory: DECR VIB AT TOES. Coordination: No ataxia or dysmetria on finger-nose or rapid alternating  movement testing. Gait and Station: UNSTEADY GAIT. Reflexes: BUE TRACE, RIGHT KNEE TRACE, RIGHT ANKLE 0. LEFT BELOW KNEE AMPUTATION.   DIAGNOSTIC DATA (LABS, IMAGING, TESTING) - I reviewed patient records, labs, notes, testing and imaging myself where available.  Lab Results  Component Value Date   WBC 8.0 02/09/2013   HGB 10.9* 02/09/2013   HCT 31.8* 02/09/2013   MCV 93 02/09/2013      Component Value Date/Time   NA 138 02/09/2013 1350   K 3.8 02/09/2013 1350   CL 95* 02/09/2013 1350   CO2 28 02/09/2013 1350   GLUCOSE 137* 02/09/2013 1350   BUN 25 02/09/2013 1350   CREATININE 6.47* 02/09/2013 1350   CALCIUM 9.4 02/09/2013 1350   PROT 6.8 02/09/2013 1350   AST 13 02/09/2013 1350   ALT 15 02/09/2013 1350   ALKPHOS 107 02/09/2013 1350   BILITOT 0.2 02/09/2013 1350   GFRNONAA 8* 02/09/2013 1350   GFRAA 9* 02/09/2013 1350   No results found for this basename: CHOL, HDL, LDLCALC, LDLDIRECT, TRIG, CHOLHDL   No results found for this basename: HGBA1C   No results found for this basename: VITAMINB12   No results found for this basename: TSH     ASSESSMENT AND PLAN  72 y.o. male with intermittent slurrred speech, jerking movements, ringing in ears, could be partial seizures.   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 Check labs and EEG 2. May consider dilantin adjustment or change to another medication    Orders Placed This Encounter  Procedures  . Phenytoin level, total  . CBC with Differential  . CMP  . EEG adult     Suanne Marker, MD 02/10/2013, 10:16 AM Certified in Neurology, Neurophysiology and Neuroimaging  [facility] [address], [address] [place], [place] [address] [phone]

## 2013-02-11 ENCOUNTER — Ambulatory Visit: Payer: Medicare Other | Admitting: *Deleted

## 2013-02-14 ENCOUNTER — Ambulatory Visit: Payer: Medicare Other | Attending: Nephrology | Admitting: Physical Therapy

## 2013-02-14 DIAGNOSIS — R269 Unspecified abnormalities of gait and mobility: Secondary | ICD-10-CM | POA: Insufficient documentation

## 2013-02-14 DIAGNOSIS — R5381 Other malaise: Secondary | ICD-10-CM | POA: Insufficient documentation

## 2013-02-14 DIAGNOSIS — R279 Unspecified lack of coordination: Secondary | ICD-10-CM | POA: Insufficient documentation

## 2013-02-14 DIAGNOSIS — Z5189 Encounter for other specified aftercare: Secondary | ICD-10-CM | POA: Insufficient documentation

## 2013-02-15 ENCOUNTER — Encounter (HOSPITAL_COMMUNITY): Payer: Self-pay | Admitting: Nurse Practitioner

## 2013-02-15 ENCOUNTER — Emergency Department (HOSPITAL_COMMUNITY)
Admission: EM | Admit: 2013-02-15 | Discharge: 2013-02-15 | Disposition: A | Discharge status: Home / Self Care | Payer: Medicare Other | Attending: Emergency Medicine | Admitting: Emergency Medicine

## 2013-02-15 ENCOUNTER — Emergency Department (HOSPITAL_COMMUNITY): Payer: Medicare Other

## 2013-02-15 DIAGNOSIS — Z862 Personal history of diseases of the blood and blood-forming organs and certain disorders involving the immune mechanism: Secondary | ICD-10-CM | POA: Insufficient documentation

## 2013-02-15 DIAGNOSIS — R197 Diarrhea, unspecified: Secondary | ICD-10-CM | POA: Insufficient documentation

## 2013-02-15 DIAGNOSIS — Z8639 Personal history of other endocrine, nutritional and metabolic disease: Secondary | ICD-10-CM | POA: Insufficient documentation

## 2013-02-15 DIAGNOSIS — E785 Hyperlipidemia, unspecified: Secondary | ICD-10-CM | POA: Insufficient documentation

## 2013-02-15 DIAGNOSIS — Z8669 Personal history of other diseases of the nervous system and sense organs: Secondary | ICD-10-CM | POA: Insufficient documentation

## 2013-02-15 DIAGNOSIS — N186 End stage renal disease: Secondary | ICD-10-CM | POA: Insufficient documentation

## 2013-02-15 DIAGNOSIS — R509 Fever, unspecified: Secondary | ICD-10-CM | POA: Insufficient documentation

## 2013-02-15 DIAGNOSIS — N390 Urinary tract infection, site not specified: Secondary | ICD-10-CM | POA: Insufficient documentation

## 2013-02-15 DIAGNOSIS — Z8679 Personal history of other diseases of the circulatory system: Secondary | ICD-10-CM | POA: Insufficient documentation

## 2013-02-15 DIAGNOSIS — Z992 Dependence on renal dialysis: Secondary | ICD-10-CM | POA: Insufficient documentation

## 2013-02-15 DIAGNOSIS — R059 Cough, unspecified: Secondary | ICD-10-CM | POA: Insufficient documentation

## 2013-02-15 DIAGNOSIS — Z79899 Other long term (current) drug therapy: Secondary | ICD-10-CM | POA: Insufficient documentation

## 2013-02-15 DIAGNOSIS — M129 Arthropathy, unspecified: Secondary | ICD-10-CM | POA: Insufficient documentation

## 2013-02-15 DIAGNOSIS — E119 Type 2 diabetes mellitus without complications: Secondary | ICD-10-CM | POA: Insufficient documentation

## 2013-02-15 DIAGNOSIS — I2510 Atherosclerotic heart disease of native coronary artery without angina pectoris: Secondary | ICD-10-CM | POA: Insufficient documentation

## 2013-02-15 DIAGNOSIS — I120 Hypertensive chronic kidney disease with stage 5 chronic kidney disease or end stage renal disease: Secondary | ICD-10-CM | POA: Insufficient documentation

## 2013-02-15 DIAGNOSIS — I739 Peripheral vascular disease, unspecified: Secondary | ICD-10-CM | POA: Insufficient documentation

## 2013-02-15 DIAGNOSIS — R05 Cough: Secondary | ICD-10-CM | POA: Insufficient documentation

## 2013-02-15 DIAGNOSIS — Z7982 Long term (current) use of aspirin: Secondary | ICD-10-CM | POA: Insufficient documentation

## 2013-02-15 DIAGNOSIS — Z86011 Personal history of benign neoplasm of the brain: Secondary | ICD-10-CM | POA: Insufficient documentation

## 2013-02-15 DIAGNOSIS — F29 Unspecified psychosis not due to a substance or known physiological condition: Secondary | ICD-10-CM | POA: Insufficient documentation

## 2013-02-15 DIAGNOSIS — Z8619 Personal history of other infectious and parasitic diseases: Secondary | ICD-10-CM | POA: Insufficient documentation

## 2013-02-15 DIAGNOSIS — I509 Heart failure, unspecified: Secondary | ICD-10-CM | POA: Insufficient documentation

## 2013-02-15 LAB — PHENYTOIN LEVEL, TOTAL: Phenytoin Lvl: 3.1 ug/mL — ABNORMAL LOW (ref 10.0–20.0)

## 2013-02-15 LAB — URINALYSIS, ROUTINE W REFLEX MICROSCOPIC
Glucose, UA: NEGATIVE mg/dL
Ketones, ur: 15 mg/dL — AB
Nitrite: NEGATIVE
Protein, ur: 100 mg/dL — AB
Urobilinogen, UA: 0.2 mg/dL (ref 0.0–1.0)

## 2013-02-15 LAB — AMMONIA: Ammonia: 18 umol/L (ref 11–60)

## 2013-02-15 LAB — BASIC METABOLIC PANEL
Calcium: 9.2 mg/dL (ref 8.4–10.5)
Creatinine, Ser: 4.47 mg/dL — ABNORMAL HIGH (ref 0.50–1.35)
GFR calc non Af Amer: 12 mL/min — ABNORMAL LOW (ref >90)
Sodium: 140 mEq/L (ref 135–145)

## 2013-02-15 LAB — CBC
Hemoglobin: 10.6 g/dL — ABNORMAL LOW (ref 13.0–17.0)
MCHC: 34.3 g/dL (ref 30.0–36.0)
Platelets: 221 10*3/uL (ref 150–400)

## 2013-02-15 LAB — URINE MICROSCOPIC-ADD ON

## 2013-02-15 LAB — VALPROIC ACID LEVEL: Valproic Acid Lvl: 12.7 ug/mL — ABNORMAL LOW (ref 50.0–100.0)

## 2013-02-15 LAB — CG4 I-STAT (LACTIC ACID): Lactic Acid, Venous: 1.85 mmol/L (ref 0.5–2.2)

## 2013-02-15 MED ORDER — CIPROFLOXACIN HCL 500 MG PO TABS: 500.0000 mg | ORAL_TABLET | Freq: Two times a day (BID) | ORAL | Status: DC

## 2013-02-15 MED ORDER — ACETAMINOPHEN 325 MG PO TABS
650.0000 mg | ORAL_TABLET | Freq: Once | ORAL | Status: AC
  Administered 2013-02-15: 650 mg via ORAL
  Filled 2013-02-15: qty 2

## 2013-02-15 NOTE — ED Notes (Addendum)
Pt sent from dialysis for possible admission for infection and altered LOC. Wife reports pt "just didn't feel well" yesterday and was "shaky and disoriented when he woke up this morning, he didn't even know what day it was." Dr Hyman Hopes at dialysis did complete Blood cultures x2 and administered vancomycin and fortaz during his dialysis treatment today, and pt completed full course of dialysis today. Mild labored breathing noted in triage. Denies pain.

## 2013-02-15 NOTE — ED Provider Notes (Signed)
History     CSN: [id number]  Arrival date & time 02/15/13  1727   First MD Initiated Contact with Patient 02/15/13 1832      Chief Complaint  Patient presents with  . Fever     Patient is a 72 y.o. male presenting with fever. The history is provided by the patient and the spouse.  Fever Severity:  Moderate Onset quality:  Gradual Duration:  1 day Progression:  Improving Chronicity:  New Worsened by:  Nothing tried Associated symptoms: confusion, cough and diarrhea   Associated symptoms: no chest pain, no headaches and no vomiting    Pt presents for fever He is a dialysis patient (he had full dialysis today) While at dialysis he was noted to have fever.  Blood cultures were sent and he was given fortaz and vancomycin  Wife states that this morning he was confused He is now more awake/alert No vomiting/diarrhea He denies headache He denies any wounds/rashes He does make urine   Past Medical History  Diagnosis Date  . Dialysis patient   . Peripheral vascular disease   . ESRD (end stage renal disease) on dialysis   . Hypertension   . Hyperlipidemia   . Arthritis     Gout  . Paroxysmal atrial fibrillation   . Brain tumor 2009  . Seizure disorder 2009  . Anemia   . Thyroid disease     hyperparathyroidism, secondary  . Diabetes mellitus without complication     Type II  . Proteus mirabilis infection   . CHF (congestive heart failure)   . CAD (coronary artery disease)   . 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 brain tumor    Past Surgical History  Procedure Laterality Date  . Av fistula placement    . Below knee leg amputation  April 2012    Left below knee amputation for Osteomyelitis  . Eye surgery      cataract  . Retinopathy surgery      Hx. of laser treatments for diabetics    Family History  Problem Relation Age of Onset  . Seizures Mother   . Transient ischemic attack Mother   . Hypertension  Mother   . Alcohol abuse Father   . Hypertension Father   . Arthritis Sister     History  Substance Use Topics  . Smoking status: Never Smoker   . Smokeless tobacco: Never Used  . Alcohol Use: 0.6 oz/week    1 Shots of liquor per week     Comment: 2 beers daily      Review of Systems  Constitutional: Positive for fever.  Respiratory: Positive for cough.   Cardiovascular: Negative for chest pain.  Gastrointestinal: Positive for diarrhea. Negative for vomiting.  Neurological: Negative for seizures, syncope and headaches.  Psychiatric/Behavioral: Positive for confusion.  All other systems reviewed and are negative.    Allergies  Review of patient's allergies indicates no known allergies.  Home Medications   Current Outpatient Rx  Name  Route  Sig  Dispense  Refill  . aspirin 81 MG tablet   Oral   Take 81 mg by mouth daily.         . Calcium Acetate-Magnesium Carb 450-200 MG TABS   Oral   Take 1 tablet by mouth daily.         . divalproex (DEPAKOTE ER) 500 MG 24 hr tablet   Oral   Take 2 tablets (1,000 mg  total) by mouth daily.   60 tablet   12   . phenytoin (DILANTIN) 50 MG tablet   Oral   Chew 150 mg by mouth every morning.           BP 110/66  Pulse 98  Temp(Src) 100.1 F (37.8 C) (Oral)  Resp 24  SpO2 99%  Physical Exam CONSTITUTIONAL: Well developed/well nourished HEAD: Normocephalic/atraumatic EYES: EOMI/PERRL ENMT: Mucous membranes moist NECK: supple no meningeal signs SPINE:entire spine nontender CV: S1/S2 noted, no murmurs/rubs/gallops noted LUNGS: Lungs are clear to auscultation bilaterally, no apparent distress ABDOMEN: soft, nontender, no rebound or guarding NEURO: Pt is awake/alert, moves all extremitiesx4 EXTREMITIES: pulses normal, full ROM Dialysis access to left UE - thrill noted S/p left BKA with prosthesis in place SKIN: warm, color normal PSYCH: no abnormalities of mood noted  ED Course  Procedures   Labs Reviewed   URINE CULTURE  CBC  URINALYSIS, ROUTINE W REFLEX MICROSCOPIC  PHENYTOIN LEVEL, TOTAL  VALPROIC ACID LEVEL  AMMONIA  BASIC METABOLIC PANEL  CG4 I-STAT (LACTIC ACID)   Dg Chest Portable 1 View  02/15/2013   *RADIOLOGY REPORT*  Clinical Data: Fever  PORTABLE CHEST - 1 VIEW  Comparison: None.  Findings: Lung volumes are low with crowding of the bronchovascular markings.  Heart size upper limits of normal.  No focal pulmonary opacity.  No pleural effusion.  No acute osseous abnormality.  IMPRESSION: No acute cardiopulmonary process.  Heart size upper limits of normal, possibly in part related to technique. If the patient's symptoms continue, consider PA and lateral chest radiographs obtained at full inspiration when the patient is clinically able.   Original Report Authenticated By: Christiana Pellant, M.D.    8:07 PM I spoke to dr Briant Cedar with nephrology He feels that if he has already been given antibiotics and cultures, he may be safe for d/c as long as he is not confused, and no septic Pt currently awake/alert,no signs of meningitis Labs currently pending 8:33 PM D/w PA Ivonne Andrew Plan is to f/u on pending labs If unremarkable, and patient is at baseline he may be amenable to d/c home    MDM  Nursing notes including past medical history and social history reviewed and considered in documentation Labs/vital reviewed and considered         Joya Gaskins, MD 02/15/13 2033

## 2013-02-15 NOTE — ED Provider Notes (Signed)
[name] S 8:00 PM patient discussed and signout with Dr. Bebe Shaggy. Patient is on dialysis which he received earlier today. He is presenting with fever symptoms. No other associated symptoms. He did receive broad-spectrum IV antibiotics during his dialysis. The cultures were also obtained at that time. His initial lab testing shows an elevated WBC otherwise no significant changes. His Dilantin and Depakote levels are not elevated to explain fever. Patient is able to occasionally produce small amounts of urine. A UA is pending. Patient has had continually stable vital signs without signs of hypotension. There is no tachycardia. He is normal mentation and no other signs concerning for early sepsis. Lactate normal. Plan to evaluate UA and make disposition.  Patient was finally able to produce urine. There does appear to be significant signs for a UTI. I discussed with the patient and his wife options for admission. At this time he continues to feel well. He preferred to go home. I will write a prescription for Cipro and he will continue to followup with his doctors for continued evaluation and treatments during his dialysis. He was given strict return precautions should he not feel well tomorrow. Both patient and his significant other agree with this plan.  Angus Seller, PA-C 02/16/13 [phone]

## 2013-02-16 ENCOUNTER — Other Ambulatory Visit: Payer: Medicare Other

## 2013-02-16 ENCOUNTER — Ambulatory Visit: Payer: Medicare Other | Admitting: Physical Therapy

## 2013-02-16 NOTE — ED Provider Notes (Signed)
Medical screening examination/treatment/procedure(s) were performed by non-physician practitioner and as supervising physician I was immediately available for consultation/collaboration.   Glynn Octave, MD 02/16/13 1212

## 2013-02-18 LAB — URINE CULTURE

## 2013-02-19 ENCOUNTER — Telehealth (HOSPITAL_COMMUNITY): Payer: Self-pay | Admitting: Emergency Medicine

## 2013-02-19 NOTE — ED Notes (Signed)
Post ED Visit - Positive Culture Follow-up  Culture report reviewed by antimicrobial stewardship pharmacist: [x]  Wes Dulaney, Pharm.D., BCPS []  Celedonio Miyamoto, [address].D., BCPS []  Georgina Pillion, Pharm.D., BCPS []  [facility], [address].D., BCPS, AAHIVP []  Estella Husk, Pharm.D., BCPS, AAHIVP  Positive urine culture Treated with Cipro, organism sensitive to the same and no further patient follow-up is required at this time.  Kylie A Holland 02/19/2013, 4:17 PM

## 2013-02-21 ENCOUNTER — Ambulatory Visit: Payer: Medicare Other | Admitting: Internal Medicine

## 2013-02-23 ENCOUNTER — Ambulatory Visit: Payer: Medicare Other | Admitting: Physical Therapy

## 2013-02-25 ENCOUNTER — Ambulatory Visit: Payer: Medicare Other | Admitting: Physical Therapy

## 2013-03-02 ENCOUNTER — Ambulatory Visit: Payer: Medicare Other | Admitting: Physical Therapy

## 2013-03-02 ENCOUNTER — Ambulatory Visit (INDEPENDENT_AMBULATORY_CARE_PROVIDER_SITE_OTHER): Payer: Medicare Other | Admitting: Radiology

## 2013-03-02 DIAGNOSIS — C712 Malignant neoplasm of temporal lobe: Secondary | ICD-10-CM

## 2013-03-02 DIAGNOSIS — G40909 Epilepsy, unspecified, not intractable, without status epilepticus: Secondary | ICD-10-CM

## 2013-03-04 ENCOUNTER — Ambulatory Visit: Payer: Medicare Other | Admitting: Physical Therapy

## 2013-03-09 ENCOUNTER — Ambulatory Visit: Payer: Medicare Other | Admitting: Physical Therapy

## 2013-03-11 ENCOUNTER — Ambulatory Visit: Payer: Medicare Other | Admitting: Physical Therapy

## 2013-03-17 NOTE — Procedures (Signed)
   [facility]  EEG (ELECTROENCEPHALOGRAM) REPORT   STUDY DATE: 03/02/13 PATIENT NAME: [name] [name] DOB: 03/06/1941 MRN: [id number]  ORDERING CLINICIAN: Joycelyn Schmid, MD   TECHNOLOGIST: Kaylyn Lim TECHNIQUE: Electroencephalogram was recorded utilizing standard 10-20 system of lead placement and reformatted into average and bipolar montages.  RECORDING TIME: 30 minutes ACTIVATION: photic stimulation  CLINICAL INFORMATION: 72 year old male with intermittent slurred speech and convulsions. History of right temporal oligoastrocytoma, status post resection.  FINDINGS: Background rhythms of 5-6 hertz and 15-20 microvolts. Intermittent frontal rhythmic delta activity is noted (FIRDA). No focal, lateralizing, epileptiform activity or seizures are seen. Patient recorded in the awake state.  IMPRESSION:  Abnormal EEG demonstrating moderate diffuse slowing with intermittent frontal rhythmic activity (FIRDA) consistent with moderate encephalopathy. No focal or epileptiform activity is seen. No electrographic seizures are recorded.   INTERPRETING PHYSICIAN:  Suanne Marker, MD Certified in Neurology, Neurophysiology and Neuroimaging  [facility] [address] [place], [place] [address] [phone]

## 2013-03-30 ENCOUNTER — Encounter: Payer: Self-pay | Admitting: Diagnostic Neuroimaging

## 2013-03-30 ENCOUNTER — Ambulatory Visit (INDEPENDENT_AMBULATORY_CARE_PROVIDER_SITE_OTHER): Payer: Medicare Other | Admitting: Diagnostic Neuroimaging

## 2013-03-30 VITALS — BP 116/65 | HR 79 | Temp 98.5°F | Ht 76.0 in | Wt 261.0 lb

## 2013-03-30 DIAGNOSIS — C712 Malignant neoplasm of temporal lobe: Secondary | ICD-10-CM

## 2013-03-30 DIAGNOSIS — G40909 Epilepsy, unspecified, not intractable, without status epilepticus: Secondary | ICD-10-CM

## 2013-03-30 NOTE — Progress Notes (Signed)
[facility]  PATIENT: [name] [name] DOB: 1941-02-04  REFERRING CLINICIAN:  HISTORY FROM: patient and wife REASON FOR VISIT: follow up   HISTORICAL  CHIEF COMPLAINT:  Chief Complaint  Patient presents with  . Follow-up    HISTORY OF PRESENT ILLNESS:   UPDATE 73/16/14: Since last visit, dilantin level was very low, so I started divalproex ER 1000mg  daily, and then tapered dilantin off. He is doing much better now. No further seizures. He has some periodic limb movements of sleep type kicks at night. No day time seizures. Short term memory still affected. Also went to ER in June for fever, found to have UTI.  UPDATE 02/09/13: Since last visit, still having 3-4 episodes per week, with slurred speech, random jerking movements, but cannot talk, but able to hear and stay awake. Episodes do not seem to be correlated with dialysis timing. Patient did not bring his medicines for me to review. Is not sure about his Dilantin dosing, but he thinks he takes 50 mg tablets, 10 tablets every morning. Also he went to [facility] and had follow up MRI brain on 11/10/12, which shows stable right temporal post surgical changes, and is stable from MRI on 01/07/11. He did not see them in clinic.  Also, I received prior records, which I summarize as follows: Patient had 2 focal seizures in January 2009. These initially consisted of feeling off balance, disorientation, difficulty speaking, left-sided numbness. He underwent stereotactic biopsy on (10/11/07, Dr. Velora Heckler, [facility]), demonstrating a well-differentiated astrocytoma.  Patient was then referred to [facility] for definitive treatment. He underwent right temporal lobe tumor resection (11/09/07, Dr. Garnette Czech, [facility]) and pathology demonstrated oligoastrocytoma. He then followed up at [facility] brain tumor Center on 12/29/07.  Additional pathology studies: Ki-67 4%, MGMT 25% (MGMT promoter methylation negative), 1p intact, 19q loss. No further  radiation or chemotherapy was suggested. Followup MRIs were recommended over time. He follow up in 2010 multiple times with stable MRIs, then not again in [facility] clinic until 07/15/12. Last 2 MRIs were 01/07/11 and 11/10/12, which have been stable. Has not been back to [facility] brain tumor center for clinic evaluation since 07/15/12.  PRIOR HPI (10/22/12): 72 year old right-handed male with history of diabetes, paroxysmal atrial fibrillation, gout, end-stage renal disease on hemodialysis, depression, anxiety, brain tumor (patient not sure what type) here for evaluation of slurred speech trouble talking, trouble swallowing and being in the ears. Patient is here with his wife.  Patient previously lived in [facility] [place], treated at [facility] for a brain tumor. He points to the right frontal region of his head. Is not sure if this was a benign or malignant tumor. Patient states that he was having left leg weakness problems, diagnosed with a brain tumor. Apparently this was treated at [facility] with biopsy and ultimately resection. He does not recall chemotherapy or radiation treatments. He has had followup scans and visits at [facility] with neurosurgery and neurology but does not know with whom. Last MRI scan was in April 2012. Last visit with neurologist was 4 months ago.  Patient also reports history of seizures, around the time of diagnosis of brain tumor. Patient describes seizures as episodes where he is unable to talk, foaming at the mouth, but awake, aware, able to hear. No convulsions. Patient has been treated with Dilantin. He reports taking 10 tablets of Dilantin the morning and 5 tablets at night. This is contradictory to his referring notes where he is apparently on 100 mg 3 times a day.  Patient is not sure of the strength of his tablets.  REVIEW OF SYSTEMS: Full 14 system review of systems performed and notable only for  Hearing loss ringing in ears trouble swallowing blurred vision snoring incontinence  urination problems feeling cold joint pain cramps aching muscles hallucination disinterest in activity too much sleep depression tremor RLS insomnia memory loss confusion.  ALLERGIES: No Known Allergies  HOME MEDICATIONS: Outpatient Encounter Prescriptions as of 03/30/2013  Medication Sig Dispense Refill  . aspirin 81 MG tablet Take 81 mg by mouth daily.      . Calcium Acetate-Magnesium Carb 450-200 MG TABS Take 1 tablet by mouth daily.      . ciprofloxacin (CIPRO) 500 MG tablet Take 1 tablet (500 mg total) by mouth 2 (two) times daily. One po bid x 7 days  14 tablet  0  . divalproex (DEPAKOTE ER) 500 MG 24 hr tablet Take 2 tablets (1,000 mg total) by mouth daily.  60 tablet  12  . gabapentin (NEURONTIN) 100 MG capsule Take 1 capsule by mouth at bedtime.      Marland Kitchen RENVELA 800 MG tablet Take 4 tablets by mouth 3 (three) times daily with meals as needed.      . VOLTAREN 1 % GEL Apply 1 application topically as needed.      . [DISCONTINUED] phenytoin (DILANTIN) 50 MG tablet Chew 150 mg by mouth every morning.       No facility-administered encounter medications on file as of 03/30/2013.    PAST MEDICAL HISTORY: Past Medical History  Diagnosis Date  . Dialysis patient   . Peripheral vascular disease   . ESRD (end stage renal disease) on dialysis   . Hypertension   . Hyperlipidemia   . Arthritis     Gout  . Paroxysmal atrial fibrillation   . Brain tumor 2009  . Seizure disorder 2009  . Anemia   . Thyroid disease     hyperparathyroidism, secondary  . Diabetes mellitus without complication     Type II  . Proteus mirabilis infection   . CHF (congestive heart failure)   . CAD (coronary artery disease)   . 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 brain tumor    PAST SURGICAL HISTORY: Past Surgical History  Procedure Laterality Date  . Av fistula placement    . Below knee leg amputation  April 2012    Left below knee amputation for  Osteomyelitis  . Eye surgery      cataract  . Retinopathy surgery      Hx. of laser treatments for diabetics    FAMILY HISTORY: Family History  Problem Relation Age of Onset  . Seizures Mother   . Transient ischemic attack Mother   . Hypertension Mother   . Alcohol abuse Father   . Hypertension Father   . Arthritis Sister     SOCIAL HISTORY:  History   Social History  . Marital Status: Married    Spouse Name: Santina Evans    Number of Children: 3  . Years of Education: 12th   Occupational History  .      N/A   Social History Main Topics  . Smoking status: Never Smoker   . Smokeless tobacco: Never Used  . Alcohol Use: 0.6 oz/week    1 Shots of liquor per week     Comment: 2 beers daily  . Drug Use: No  . Sexually Active: Not on file   Other  Topics Concern  . Not on file   Social History Narrative   Pt lives at home with spouse.   Caffeine Use: 1-2 cups daily.     PHYSICAL EXAM  Filed Vitals:   03/30/13 0848  BP: 116/65  Pulse: 79  Temp: 98.5 F (36.9 C)  TempSrc: Oral  Height: 6\' 4"  (1.93 m)  Weight: 261 lb (118.389 kg)    Not recorded    Body mass index is 31.78 kg/(m^2).  EXAM: General: Patient is awake, alert and in no acute distress.  Well developed and groomed. Cardiovascular: No carotid artery bruits.  Heart is regular rate and rhythm with no murmurs.  Neurologic Exam  Mental Status: Awake, alert. Language is fluent and comprehension intact. Cranial Nerves: No evidence of papilledema on funduscopic exam.  Pupils are equal and reactive to light.  Visual fields are full to confrontation, EXCEPT LEFT SUPERIOR QUADRANTANOPSIA. Conjugate eye movements are full and symmetric.  Facial sensation and strength are symmetric.  Hearing is intact.  Palate elevated symmetrically and uvula is midline.  Shoulder shrug is symmetric.  Tongue is midline. Motor: Normal bulk and tone.  Full strength in the upper and lower extremities.  LEFT BELOW KNEE  AMPUTATION. No pronator drift. Sensory: DECR VIB AT TOES. Coordination: No ataxia or dysmetria on finger-nose or rapid alternating movement testing. Gait and Station: UNSTEADY GAIT. Reflexes: BUE TRACE, RIGHT KNEE TRACE, RIGHT ANKLE 0. LEFT BELOW KNEE AMPUTATION.   DIAGNOSTIC DATA (LABS, IMAGING, TESTING) - I reviewed patient records, labs, notes, testing and imaging myself where available.  Lab Results  Component Value Date   WBC 20.7* 02/15/2013   HGB 10.6* 02/15/2013   HCT 30.9* 02/15/2013   MCV 92.2 02/15/2013   PLT 221 02/15/2013      Component Value Date/Time   NA 140 02/15/2013 1935   NA 138 02/09/2013 1350   K 3.6 02/15/2013 1935   CL 97 02/15/2013 1935   CO2 28 02/15/2013 1935   GLUCOSE 207* 02/15/2013 1935   GLUCOSE 137* 02/09/2013 1350   BUN 17 02/15/2013 1935   BUN 25 02/09/2013 1350   CREATININE 4.47* 02/15/2013 1935   CALCIUM 9.2 02/15/2013 1935   PROT 6.8 02/09/2013 1350   AST 13 02/09/2013 1350   ALT 15 02/09/2013 1350   ALKPHOS 107 02/09/2013 1350   BILITOT 0.2 02/09/2013 1350   GFRNONAA 12* 02/15/2013 1935   GFRAA 14* 02/15/2013 1935    Lab Results  Component Value Date   PHENYTOIN 3.1* 02/15/2013   VALPROATE 12.7* 02/15/2013     03/17/13 EEG - moderate diffuse slowing with intermittent frontal rhythmic activity (FIRDA) consistent with moderate encephalopathy. No focal or epileptiform activity is seen. No electrographic seizures are recorded.   ASSESSMENT AND PLAN  72 y.o. male with intermittent slurrred speech, jerking movements, ringing in ears, could be partial seizures. Much better controlled with divalproex than with dilantin.  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 Continue divalproex ER 1000mg  daily for seizure control 2. No driving for now 3. Check labs at next visit    Suanne Marker, MD 03/30/2013, 9:31 AM Certified in Neurology, Neurophysiology and Neuroimaging  [facility] [address]  [address] [place], [place] [address] [phone]

## 2013-03-30 NOTE — Patient Instructions (Signed)
Continue current meds 

## 2013-05-02 ENCOUNTER — Ambulatory Visit: Payer: Self-pay | Admitting: Diagnostic Neuroimaging

## 2013-05-26 ENCOUNTER — Encounter (HOSPITAL_COMMUNITY): Payer: Self-pay | Admitting: Emergency Medicine

## 2013-05-26 ENCOUNTER — Observation Stay (HOSPITAL_COMMUNITY)
Admission: EM | Admit: 2013-05-26 | Discharge: 2013-05-28 | Disposition: A | Discharge status: Home / Self Care | Payer: Medicare Other | Attending: Family Medicine | Admitting: Family Medicine

## 2013-05-26 ENCOUNTER — Emergency Department (HOSPITAL_COMMUNITY): Payer: Medicare Other

## 2013-05-26 DIAGNOSIS — C719 Malignant neoplasm of brain, unspecified: Secondary | ICD-10-CM

## 2013-05-26 DIAGNOSIS — I120 Hypertensive chronic kidney disease with stage 5 chronic kidney disease or end stage renal disease: Secondary | ICD-10-CM | POA: Insufficient documentation

## 2013-05-26 DIAGNOSIS — I10 Essential (primary) hypertension: Secondary | ICD-10-CM

## 2013-05-26 DIAGNOSIS — R0789 Other chest pain: Principal | ICD-10-CM

## 2013-05-26 DIAGNOSIS — Z992 Dependence on renal dialysis: Secondary | ICD-10-CM

## 2013-05-26 DIAGNOSIS — I70269 Atherosclerosis of native arteries of extremities with gangrene, unspecified extremity: Secondary | ICD-10-CM

## 2013-05-26 DIAGNOSIS — G40909 Epilepsy, unspecified, not intractable, without status epilepticus: Secondary | ICD-10-CM

## 2013-05-26 DIAGNOSIS — Z79899 Other long term (current) drug therapy: Secondary | ICD-10-CM | POA: Insufficient documentation

## 2013-05-26 DIAGNOSIS — I2089 Other forms of angina pectoris: Secondary | ICD-10-CM

## 2013-05-26 DIAGNOSIS — K219 Gastro-esophageal reflux disease without esophagitis: Secondary | ICD-10-CM

## 2013-05-26 DIAGNOSIS — IMO0001 Reserved for inherently not codable concepts without codable children: Secondary | ICD-10-CM

## 2013-05-26 DIAGNOSIS — N2581 Secondary hyperparathyroidism of renal origin: Secondary | ICD-10-CM | POA: Insufficient documentation

## 2013-05-26 DIAGNOSIS — E1129 Type 2 diabetes mellitus with other diabetic kidney complication: Secondary | ICD-10-CM | POA: Insufficient documentation

## 2013-05-26 DIAGNOSIS — E119 Type 2 diabetes mellitus without complications: Secondary | ICD-10-CM

## 2013-05-26 DIAGNOSIS — I4891 Unspecified atrial fibrillation: Secondary | ICD-10-CM | POA: Insufficient documentation

## 2013-05-26 DIAGNOSIS — I200 Unstable angina: Secondary | ICD-10-CM

## 2013-05-26 DIAGNOSIS — I480 Paroxysmal atrial fibrillation: Secondary | ICD-10-CM

## 2013-05-26 DIAGNOSIS — Z531 Procedure and treatment not carried out because of patient's decision for reasons of belief and group pressure: Secondary | ICD-10-CM | POA: Insufficient documentation

## 2013-05-26 DIAGNOSIS — I5032 Chronic diastolic (congestive) heart failure: Secondary | ICD-10-CM | POA: Insufficient documentation

## 2013-05-26 DIAGNOSIS — I509 Heart failure, unspecified: Secondary | ICD-10-CM | POA: Insufficient documentation

## 2013-05-26 DIAGNOSIS — N186 End stage renal disease: Secondary | ICD-10-CM | POA: Insufficient documentation

## 2013-05-26 DIAGNOSIS — G40209 Localization-related (focal) (partial) symptomatic epilepsy and epileptic syndromes with complex partial seizures, not intractable, without status epilepticus: Secondary | ICD-10-CM

## 2013-05-26 DIAGNOSIS — E669 Obesity, unspecified: Secondary | ICD-10-CM | POA: Insufficient documentation

## 2013-05-26 DIAGNOSIS — I208 Other forms of angina pectoris: Secondary | ICD-10-CM

## 2013-05-26 DIAGNOSIS — I209 Angina pectoris, unspecified: Secondary | ICD-10-CM

## 2013-05-26 DIAGNOSIS — E785 Hyperlipidemia, unspecified: Secondary | ICD-10-CM

## 2013-05-26 HISTORY — DX: Secondary hyperparathyroidism of renal origin: N25.81

## 2013-05-26 HISTORY — DX: Gastro-esophageal reflux disease without esophagitis: K21.9

## 2013-05-26 LAB — CBC
HCT: 33.2 % — ABNORMAL LOW (ref 39.0–52.0)
MCH: 31.9 pg (ref 26.0–34.0)
MCHC: 35.2 g/dL (ref 30.0–36.0)
MCV: 90.5 fL (ref 78.0–100.0)
RDW: 13.6 % (ref 11.5–15.5)

## 2013-05-26 LAB — POCT I-STAT TROPONIN I: Troponin i, poc: 0 ng/mL (ref 0.00–0.08)

## 2013-05-26 LAB — COMPREHENSIVE METABOLIC PANEL
ALT: 11 U/L (ref 0–53)
CO2: 26 mEq/L (ref 19–32)
Calcium: 9.7 mg/dL (ref 8.4–10.5)
Creatinine, Ser: 6.85 mg/dL — ABNORMAL HIGH (ref 0.50–1.35)
GFR calc Af Amer: 8 mL/min — ABNORMAL LOW (ref >90)
GFR calc non Af Amer: 7 mL/min — ABNORMAL LOW (ref >90)
Glucose, Bld: 161 mg/dL — ABNORMAL HIGH (ref 70–99)
Sodium: 136 mEq/L (ref 135–145)
Total Protein: 6.8 g/dL (ref 6.0–8.3)

## 2013-05-26 LAB — CBC WITH DIFFERENTIAL/PLATELET
Basophils Absolute: 0 10*3/uL (ref 0.0–0.1)
Eosinophils Absolute: 0.1 10*3/uL (ref 0.0–0.7)
Eosinophils Relative: 2 % (ref 0–5)
HCT: 32.9 % — ABNORMAL LOW (ref 39.0–52.0)
Lymphocytes Relative: 36 % (ref 12–46)
Lymphs Abs: 2.2 10*3/uL (ref 0.7–4.0)
MCH: 31.3 pg (ref 26.0–34.0)
MCV: 91.1 fL (ref 78.0–100.0)
Monocytes Absolute: 0.8 10*3/uL (ref 0.1–1.0)
Platelets: 242 10*3/uL (ref 150–400)
RDW: 13.8 % (ref 11.5–15.5)
WBC: 6.3 10*3/uL (ref 4.0–10.5)

## 2013-05-26 LAB — VALPROIC ACID LEVEL: Valproic Acid Lvl: 15.9 ug/mL — ABNORMAL LOW (ref 50.0–100.0)

## 2013-05-26 LAB — PROTIME-INR: INR: 0.97 (ref 0.00–1.49)

## 2013-05-26 MED ORDER — SODIUM CHLORIDE 0.9 % IJ SOLN: 3.0000 mL | INTRAMUSCULAR | Status: DC | PRN

## 2013-05-26 MED ORDER — SODIUM CHLORIDE 0.9 % IJ SOLN: 3.0000 mL | Freq: Two times a day (BID) | INTRAMUSCULAR | Status: DC

## 2013-05-26 MED ORDER — OMEPRAZOLE MAGNESIUM 20 MG PO TBEC: 20.0000 mg | DELAYED_RELEASE_TABLET | Freq: Every day | ORAL | Status: DC | PRN

## 2013-05-26 MED ORDER — ONDANSETRON HCL 4 MG/2ML IJ SOLN: 4.0000 mg | Freq: Four times a day (QID) | INTRAMUSCULAR | Status: DC | PRN

## 2013-05-26 MED ORDER — ATORVASTATIN CALCIUM 40 MG PO TABS
40.0000 mg | ORAL_TABLET | Freq: Every day | ORAL | Status: DC
  Administered 2013-05-26 – 2013-05-27 (×2): 40 mg via ORAL
  Filled 2013-05-26 (×3): qty 1

## 2013-05-26 MED ORDER — INSULIN ASPART 100 UNIT/ML ~~LOC~~ SOLN
0.0000 [IU] | Freq: Three times a day (TID) | SUBCUTANEOUS | Status: DC
  Administered 2013-05-27: 1 [IU] via SUBCUTANEOUS

## 2013-05-26 MED ORDER — ACETAMINOPHEN 325 MG PO TABS
650.0000 mg | ORAL_TABLET | ORAL | Status: DC | PRN
  Administered 2013-05-28: 650 mg via ORAL
  Filled 2013-05-26: qty 2

## 2013-05-26 MED ORDER — ASPIRIN EC 81 MG PO TBEC
81.0000 mg | DELAYED_RELEASE_TABLET | Freq: Every day | ORAL | Status: DC
  Administered 2013-05-26 – 2013-05-28 (×2): 81 mg via ORAL
  Filled 2013-05-26 (×3): qty 1

## 2013-05-26 MED ORDER — NITROGLYCERIN 0.4 MG SL SUBL: 0.4000 mg | SUBLINGUAL_TABLET | SUBLINGUAL | Status: DC | PRN

## 2013-05-26 MED ORDER — DIVALPROEX SODIUM ER 500 MG PO TB24
1000.0000 mg | ORAL_TABLET | Freq: Every day | ORAL | Status: DC
  Administered 2013-05-26 – 2013-05-28 (×3): 1000 mg via ORAL
  Filled 2013-05-26 (×3): qty 2

## 2013-05-26 MED ORDER — SODIUM CHLORIDE 0.9 % IJ SOLN
3.0000 mL | Freq: Two times a day (BID) | INTRAMUSCULAR | Status: DC
  Administered 2013-05-27 – 2013-05-28 (×3): 3 mL via INTRAVENOUS

## 2013-05-26 MED ORDER — METOPROLOL TARTRATE 12.5 MG HALF TABLET
12.5000 mg | ORAL_TABLET | Freq: Two times a day (BID) | ORAL | Status: DC
  Administered 2013-05-26 – 2013-05-28 (×3): 12.5 mg via ORAL
  Filled 2013-05-26 (×5): qty 1

## 2013-05-26 MED ORDER — ASPIRIN 81 MG PO CHEW
324.0000 mg | CHEWABLE_TABLET | ORAL | Status: AC
  Administered 2013-05-27: 324 mg via ORAL
  Filled 2013-05-26: qty 4

## 2013-05-26 MED ORDER — SEVELAMER CARBONATE 800 MG PO TABS
1600.0000 mg | ORAL_TABLET | Freq: Two times a day (BID) | ORAL | Status: DC
Start: 2013-05-27 — End: 2013-05-27
  Filled 2013-05-26 (×4): qty 2

## 2013-05-26 MED ORDER — ASPIRIN 81 MG PO CHEW
324.0000 mg | CHEWABLE_TABLET | Freq: Once | ORAL | Status: AC
  Administered 2013-05-26: 324 mg via ORAL
  Filled 2013-05-26: qty 4

## 2013-05-26 MED ORDER — SODIUM CHLORIDE 0.9 % IV SOLN: 250.0000 mL | INTRAVENOUS | Status: DC | PRN

## 2013-05-26 MED ORDER — PANTOPRAZOLE SODIUM 40 MG PO TBEC: 40.0000 mg | DELAYED_RELEASE_TABLET | Freq: Every day | ORAL | Status: DC | PRN

## 2013-05-26 MED ORDER — GABAPENTIN 100 MG PO CAPS
100.0000 mg | ORAL_CAPSULE | Freq: Every day | ORAL | Status: DC
  Administered 2013-05-26 – 2013-05-27 (×2): 100 mg via ORAL
  Filled 2013-05-26 (×3): qty 1

## 2013-05-26 MED ORDER — HEPARIN SODIUM (PORCINE) 5000 UNIT/ML IJ SOLN
5000.0000 [IU] | Freq: Three times a day (TID) | INTRAMUSCULAR | Status: DC
  Administered 2013-05-26 – 2013-05-27 (×2): 5000 [IU] via SUBCUTANEOUS
  Filled 2013-05-26 (×5): qty 1

## 2013-05-26 NOTE — H&P (Addendum)
[name] [name] History and Physical  [name] [name] ZOX:[id number] DOB: [birth year] DOA: 05/26/2013  Referring physician:  Gwyneth [name] PCP:  [name] Cords, MD   Chief Complaint:  Chest pressure  HPI:  The patient is a 72 y.o. year-old male with history of obesity, HTN, HLD, T2DM, PVD, PAF, grade 1 diastolic heart failure, ESRD (due to HTN/ DM - related nephrotic syndrome 2011), oligoastrocytoma s/p resection in 2009, seizure disorder, who presents with substernal chest pressure.  The patient was last at their baseline health a few weeks ago.  The patient has had 3-4 episodes of 4 out of 10 substernal chest pressure with radiation to the left chest, left jaw, and left shoulder which come on at rest and lasts for 10-15 minutes.  His first episode was 1-1.5 weeks ago and he has had an episode every few days.  They resolve spontaneously. He has associated flushing, lightheadedness, blurry vision.  He denies shortness of breath, however the EMS provider to pick him up from his clinic today noted that he appeared dyspneic.  He denies nausea but does have an indigestion feeling.  Denies DOE, PND, orthopnea, palpitations.  He attributed his pain to acid reflux and started taking a few tabs of Prilosec the last few days. He was at clinic today when he had an episode of chest pain and the clinic called EMS to transport him to the emergency department.   He stated he had a stress test since starting HD possible at [facility], but I could not find the stress test in the [facility] records.    Note:  Patient is a poor historian.  He tells me he has been on HD for about 1 year, however, records show he has been on HD for probably 3 years.    In the emergency department, labs were consistent with end-stage renal disease, initial troponin was negative, ECG demonstrate stated normal sinus rhythm with stable inverted T wave in aVL, stable 1 mm ST segment elevation in V1, and the 2. No other ST segment  depressions or elevations or inverted T waves to suggest acute ischemia.  CXR without acute changes.  He is being admitted for further evaluation of his chest pain.    Review of Systems:  General:  Denies fevers, chills, weight loss or gain HEENT:  Denies changes to hearing and vision, rhinorrhea, sinus congestion, sore throat CV:  Denies lower extremity edema.  PULM:  Denies SOB, wheezing, cough.   GI:  Denies nausea, vomiting, constipation, diarrhea.   GU:  Denies dysuria, frequency, urgency - makes minimal urine. ENDO:  Makes minimal urine.   HEME:  Denies hematemesis, blood in stools, melena, abnormal bruising or bleeding.  LYMPH:  Denies lymphadenopathy.   MSK:  Denies arthralgias, myalgias.   DERM:  Denies skin rash or ulcer.   NEURO:  Denies focal numbness, weakness, slurred speech, confusion, facial droop. Has Kieren Adkison term memory loss.   PSYCH:  Denies anxiety and depression.    Past Medical History  Diagnosis Date  . Dialysis patient   . Peripheral vascular disease   . ESRD (end stage renal disease) on dialysis 09/2010    MWF  . Hypertension   . Hyperlipidemia   . Arthritis     Gout  . Paroxysmal atrial fibrillation   . Brain tumor 2009  . Seizure disorder 2009  . Anemia   . Hyperparathyroidism, secondary   . Diabetes mellitus without complication     Type II, diet controlled  . Proteus mirabilis  infection   . CHF (congestive heart failure)     grade 1 DD, preserved EF, [facility] records  . CAD (coronary artery disease)     reports that last stress test was 1-2 years ago at [facility] and was negative, but have not found these records.    . 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  [name] [name] term memory loss related to this. brain tumor  . GERD (gastroesophageal reflux disease)    Past Surgical History  Procedure Laterality Date  . Av fistula placement Left     arm  . 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 Eye  surgery Bilateral 2000    cataract  . Retinopathy surgery      Hx. of laser treatments for diabetics   Social History:  reports that he has never smoked. He has never used smokeless tobacco. He reports that he drinks about 0.6 ounces of alcohol per week. He reports that he does not use illicit drugs. Pt lives at home with spouse. Caffeine Use: 1-2 cups daily. Uses a cane for ambulation and prosthetic limb  Left leg.    No Known Allergies  Family History  Problem Relation Age of Onset  . Seizures Mother   . Stroke Mother 3  . Hypertension Mother   . Alcohol abuse Father   . Hypertension Father   . Arthritis Sister   . Heart attack Neg Hx   . Heart failure Neg Hx      Prior to Admission medications   Medication Sig Start Date End Date Taking? Authorizing Provider  aspirin 81 MG tablet Take 81 mg by mouth daily.   Yes Historical Provider, MD  divalproex (DEPAKOTE ER) 500 MG 24 hr tablet Take 2 tablets (1,000 mg total) by mouth daily. 02/10/13  Yes Suanne Marker, MD  gabapentin (NEURONTIN) 100 MG capsule Take 1 capsule by mouth at bedtime. 01/21/13  Yes Historical Provider, MD  omeprazole (PRILOSEC OTC) 20 MG tablet Take 20 mg by mouth daily as needed (for heartburn).   Yes Historical Provider, MD  RENVELA 800 MG tablet Take 1,600 mg by mouth 2 (two) times daily before a meal.  03/01/13  Yes Historical Provider, MD   Physical Exam: Filed Vitals:   05/26/13 1500 05/26/13 1545 05/26/13 1600 05/26/13 1656  BP: 139/76   140/71  Pulse: 65 65 65 71  Temp:    97.7 F (36.5 C)  TempSrc:    Oral  Resp: 8 18 13 18   Height:    6\' 4"  (1.93 m)  Weight:    109.09 kg (240 lb 8 oz)  SpO2: 99% 100% 98% 100%     General:  AAM, no acute distress  Eyes:  PERRL, anicteric, non-injected.  ENT:  Nares clear.  OP clear, non-erythematous without plaques or exudates.  MMM.  Neck:  Supple without TM or JVD.    Lymph:  No cervical, supraclavicular, or submandibular LAD.  Cardiovascular:   Distant heart sounds, RRR, normal S1, S2, without m/r/g.  2+ radial pulses  Respiratory:  CTA bilaterally without increased WOB.  Abdomen:  NABS.  Soft, ND/NT.    Skin:  No rashes or focal lesions.  Musculoskeletal:  Normal bulk and tone.  No LE edema.  Left BKA.    Psychiatric:  A, pleasant, confused about dates and times.  Situationally aware, however.  Appropriate affect.  Neurologic:  CN 3-12 intact.  5/5 strength.  Sensation intact.  Labs on Admission:  Basic Metabolic Panel:  Recent Labs Lab 05/26/13 1310  NA 136  K 3.8  CL 94*  CO2 26  GLUCOSE 161*  BUN 30*  CREATININE 6.85*  CALCIUM 9.7   Liver Function Tests:  Recent Labs Lab 05/26/13 1310  AST 13  ALT 11  ALKPHOS 77  BILITOT 0.3  PROT 6.8  ALBUMIN 3.4*   No results found for this basename: LIPASE, AMYLASE,  in the last 168 hours No results found for this basename: AMMONIA,  in the last 168 hours CBC:  Recent Labs Lab 05/26/13 1310  WBC 6.3  NEUTROABS 3.1  HGB 11.3*  HCT 32.9*  MCV 91.1  PLT 242   Cardiac Enzymes: No results found for this basename: CKTOTAL, CKMB, CKMBINDEX, TROPONINI,  in the last 168 hours  BNP (last 3 results) No results found for this basename: PROBNP,  in the last 8760 hours CBG: No results found for this basename: GLUCAP,  in the last 168 hours  Radiological Exams on Admission: Dg Chest 2 View  05/26/2013   *RADIOLOGY REPORT*  Clinical Data: Chest pain  CHEST - 2 VIEW  Comparison: February 15, 2013.  Findings: Cardiomediastinal silhouette appears normal.  No acute pulmonary disease is noted.  Bony thorax is intact.  IMPRESSION: No acute cardiopulmonary abnormality seen.   Original Report Authenticated By: Lupita Raider.,  M.D.    EKG: Independently reviewed. normal sinus rhythm with stable inverted T wave in aVL, stable 1 mm ST segment elevation in V1, and the 2. No other ST segment depressions or elevations or inverted T waves to suggest acute  ischemia  Assessment/Plan Principal Problem:   Chest pressure Active Problems:   Atherosclerotic peripheral vascular disease with gangrene   Complex partial seizures   ESRD (end stage renal disease) on dialysis   Hypertension   Hyperlipidemia   Paroxysmal atrial fibrillation   Mixed oligoastrocytoma   Diabetes mellitus without complication   GERD (gastroesophageal reflux disease) ---   Chest pain sounds suggestive of unstable angina:  Differential includes ACS in this patient with risk factors of male, diabetes, obesity, hypertension, and hyperlipidemia, PVD, ESRD.  Differential also includes GERD.  No evidence of PNA, PTX on CXR.  Cardiac risk score 6.   -  Telemetry -  Cycle troponins and ECG -  A1c -  Lipid panel -  Daily aspirin -  Start low dose beta blocker -  Start atorvastatin -  NTG prn chest pain -  No maalox (contains aluminum) -  Continue PPI -  Consider initiation of heparin gtt, particularly if troponin rises. -  Cardiology consulted for possible catheterization vs. Stress test   ESRD, MWF schedule. -  Notified nephrology of admission.    Secondary hyperparathyroidism due to ESRD, stable.  Continue renvela.  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  Hgb stable.  Epo dosing per nephrology.  PVD, stable.  Continue ASA  HTN, blood pressure elevated -  Start metoprolol  HLD, not on medications, but is known vasculopath. -  Lipid panel -  Start atorvastatin  PAF, currently sinus rhythm on telemetry.  Not on A/C.  T2DM, diet controlled, but glucose 161 -  A1c -  Start low dose SSI   Hx of oligoastrocytoma and complex partial seizures.  Seizure free.  Continue depakote.    GERD, start daily PPI.    Diet:  Renal Access:  PIV IVF:  OFF Proph:  Heparin   Code Status: full Family  Communication: patient and wife Disposition Plan: Admit to telemetry  Time spent: 60 min Renae Fickle [name] [name] Pager [phone]  If 7PM-7AM, please  contact night-coverage [email or web address] Password [facility] 05/26/2013, 5:12 PM

## 2013-05-26 NOTE — ED Notes (Signed)
Pt returned from radiology.

## 2013-05-26 NOTE — ED Notes (Signed)
Phlebotomy has been called to come draw bld.

## 2013-05-26 NOTE — Consult Note (Signed)
 Referring Physician: Dr. Short ([facility]) Primary Physician: Primary Cardiologist: New Reason for Consultation: CP    HPI:  [name] [name] is a 72 y.o. year-old male (J[employer]'s [employer]) with history of obesity, HTN, HLD, T2DM, PAD s/p LBKA, PAF, grade 1 diastolic heart failure, ESRD (due to HTN/ DM - related nephrotic syndrome 2011), oligoastrocytoma s/p resection in 2009, seizure disorder, who presents with substernal chest pressure.  Denies h/o known CAD. Had nuclear stress test at [facility] over 2-3 years ago which was normal. (unable to find this in Care Everywhere)   Says over last week has had 3-4 episodes of 4 out of 10 substernal chest pressure with radiation to the  left jaw, which come on at rest and lasts for 10-15 minutes. His first episode was 1-1.5 weeks ago and he has had an episode every few days. They resolve spontaneously. He has associated flushing, lightheadedness, blurry vision. He denies shortness of breath, however the EMS provider to pick him up from his clinic today noted that he appeared dyspneic. His wife also says that he gets dyspneic on taking trash out. He denies nausea but does have an indigestion feeling. Denies, PND, orthopnea, palpitations. He attributed his pain to acid reflux and started taking a few tabs of Prilosec the last few days. He was at clinic today when he had an episode of chest pain and the clinic called EMS to transport him to the emergency department. His wife says CP has been getting worse over the past week.   In the emergency department, labs were consistent with end-stage renal disease, initial troponin was negative, ECG  normal sinus rhythm no ST changes. CXR without acute changes. He is being admitted for further evaluation of his chest pain.    Review of Systems:     Cardiac Review of Systems: {Y] = yes [ ] = no  Chest Pain [ y   ]  Resting SOB [   ] Exertional SOB  [y  ]  Orthopnea [  ]   Pedal Edema [   ]    Palpitations [   ] Syncope  [  ]   Presyncope [   ]  General Review of Systems: [Y] = yes [  ]=no Constitional: recent weight change [  ]; anorexia [  ]; fatigue [  ]; nausea [  ]; night sweats [  ]; fever [  ]; or chills [  ];                                                                                                                                          Dental: poor dentition[  ];   Eye : blurred vision [  ]; diplopia [   ]; vision changes [  ];  Amaurosis fugax[  ]; Resp: cough [  ];  wheezing[  ];  hemoptysis[  ]; shortness of   breath[  ]; paroxysmal nocturnal dyspnea[  ]; dyspnea on exertion[  ]; or orthopnea[  ];  GI:  gallstones[  ], vomiting[  ];  dysphagia[  ]; melena[  ];  hematochezia [  ]; heartburn[ y ];   GU: kidney stones [  ]; hematuria[  ];   dysuria [  ];  nocturia[  ];  history of     obstruction [  ];                 Skin: rash, swelling[  ];, hair loss[  ];  peripheral edema[  ];  or itching[  ]; Musculosketetal: myalgias[  ];  joint swelling[  ];  joint erythema[  ];  joint pain[  ];  back pain[  ];  Heme/Lymph: bruising[  ];  bleeding[  ];  anemia[  ];  Neuro: TIA[  ];  headaches[  ];  stroke[  ];  vertigo[  ];  seizures[  ];   paresthesias[  y];  difficulty walking[  y];  Psych:depression[  ]; anxiety[  ];  Endocrine: diabetes[ y ];  thyroid dysfunction[  ];  Other:  Past Medical History  Diagnosis Date  . Dialysis patient   . Peripheral vascular disease   . ESRD (end stage renal disease) on dialysis 09/2010    MWF  . Hypertension   . Hyperlipidemia   . Arthritis     Gout  . Paroxysmal atrial fibrillation   . Brain tumor 2009  . Seizure disorder 2009  . Anemia   . Hyperparathyroidism, secondary   . Diabetes mellitus without complication     Type II, diet controlled  . Proteus mirabilis infection   . CHF (congestive heart failure)     grade 1 DD, preserved EF, [facility] records  . CAD (coronary artery disease)     reports that last stress test was 1-2 years ago at [facility]   and was negative, but have not found these records.    . 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 brain tumor  . GERD (gastroesophageal reflux disease)     Medications Prior to Admission  Medication Sig Dispense Refill  . aspirin 81 MG tablet Take 81 mg by mouth daily.      . divalproex (DEPAKOTE ER) 500 MG 24 hr tablet Take 2 tablets (1,000 mg total) by mouth daily.  60 tablet  12  . gabapentin (NEURONTIN) 100 MG capsule Take 1 capsule by mouth at bedtime.      . omeprazole (PRILOSEC OTC) 20 MG tablet Take 20 mg by mouth daily as needed (for heartburn).      . RENVELA 800 MG tablet Take 1,600 mg by mouth 2 (two) times daily before a meal.          . aspirin EC  81 mg Oral Daily  . atorvastatin  40 mg Oral q1800  . divalproex  1,000 mg Oral Daily  . gabapentin  100 mg Oral QHS  . heparin  5,000 Units Subcutaneous Q8H  . insulin aspart  0-9 Units Subcutaneous TID WC  . metoprolol tartrate  12.5 mg Oral BID  . [START ON 05/27/2013] sevelamer carbonate  1,600 mg Oral BID AC  . sodium chloride  3 mL Intravenous Q12H    Infusions:    No Known Allergies  History   Social History  . Marital Status: Married    Spouse Name: Catherine    Number of Children: 3  .   Years of Education: 12th   Occupational History  .      N/A   Social History Main Topics  . Smoking status: Never Smoker   . Smokeless tobacco: Never Used  . Alcohol Use: 0.6 oz/week    1 Shots of liquor per week     Comment: 2-4 beers daily    . Drug Use: No  . Sexual Activity: Not on file   Other Topics Concern  . Not on file   Social History Narrative   Pt lives at home with spouse.   Caffeine Use: 1-2 cups daily.   Uses a cane for ambulation and prosthetic limb  Left leg.      Family History  Problem Relation Age of Onset  . Seizures Mother   . Stroke Mother 65  . Hypertension Mother   . Alcohol abuse Father   . Hypertension Father   .  Arthritis Sister   . Heart attack Neg Hx   . Heart failure Neg Hx     PHYSICAL EXAM: Filed Vitals:   05/26/13 1656  BP: 140/71  Pulse: 71  Temp: 97.7 F (36.5 C)  Resp: 18    No intake or output data in the 24 hours ending 05/26/13 1834  General:  Well developed No respiratory difficulty HEENT: normal Neck: supple. no JVD. Carotids 2+ bilat; no bruits. No lymphadenopathy or thryomegaly appreciated. Cor: PMI nondisplaced. Regular rate & rhythm. No rubs, gallops or murmurs. Lungs: clear Abdomen: soft, nontender, nondistended. No hepatosplenomegaly. No bruits or masses. Good bowel sounds. Extremities: no cyanosis, clubbing, rash, edema s/p L BKA. RUE AVF Neuro: alert & oriented x 3, cranial nerves grossly intact. moves all 4 extremities w/o difficulty. Affect pleasant.  ECG: NSR 75 No ST-T wave abnormalities.    Results for orders placed during the hospital encounter of 05/26/13 (from the past 24 hour(s))  CBC WITH DIFFERENTIAL     Status: Abnormal   Collection Time    05/26/13  1:10 PM      Result Value Range   WBC 6.3  4.0 - 10.5 K/uL   RBC 3.61 (*) 4.22 - 5.81 MIL/uL   Hemoglobin 11.3 (*) 13.0 - 17.0 g/dL   HCT 32.9 (*) 39.0 - 52.0 %   MCV 91.1  78.0 - 100.0 fL   MCH 31.3  26.0 - 34.0 pg   MCHC 34.3  30.0 - 36.0 g/dL   RDW 13.8  11.5 - 15.5 %   Platelets 242  150 - 400 K/uL   Neutrophils Relative % 50  43 - 77 %   Neutro Abs 3.1  1.7 - 7.7 K/uL   Lymphocytes Relative 36  12 - 46 %   Lymphs Abs 2.2  0.7 - 4.0 K/uL   Monocytes Relative 12  3 - 12 %   Monocytes Absolute 0.8  0.1 - 1.0 K/uL   Eosinophils Relative 2  0 - 5 %   Eosinophils Absolute 0.1  0.0 - 0.7 K/uL   Basophils Relative 1  0 - 1 %   Basophils Absolute 0.0  0.0 - 0.1 K/uL  COMPREHENSIVE METABOLIC PANEL     Status: Abnormal   Collection Time    05/26/13  1:10 PM      Result Value Range   Sodium 136  135 - 145 mEq/L   Potassium 3.8  3.5 - 5.1 mEq/L   Chloride 94 (*) 96 - 112 mEq/L   CO2 26  19 -  32 mEq/L     Glucose, Bld 161 (*) 70 - 99 mg/dL   BUN 30 (*) 6 - 23 mg/dL   Creatinine, Ser 6.85 (*) 0.50 - 1.35 mg/dL   Calcium 9.7  8.4 - 10.5 mg/dL   Total Protein 6.8  6.0 - 8.3 g/dL   Albumin 3.4 (*) 3.5 - 5.2 g/dL   AST 13  0 - 37 U/L   ALT 11  0 - 53 U/L   Alkaline Phosphatase 77  39 - 117 U/L   Total Bilirubin 0.3  0.3 - 1.2 mg/dL   GFR calc non Af Amer 7 (*) >90 mL/min   GFR calc Af Amer 8 (*) >90 mL/min  PROTIME-INR     Status: None   Collection Time    05/26/13  1:10 PM      Result Value Range   Prothrombin Time 12.7  11.6 - 15.2 seconds   INR 0.97  0.00 - 1.49  VALPROIC ACID LEVEL     Status: Abnormal   Collection Time    05/26/13  1:10 PM      Result Value Range   Valproic Acid Lvl 15.9 (*) 50.0 - 100.0 ug/mL  POCT I-STAT TROPONIN I     Status: None   Collection Time    05/26/13  2:34 PM      Result Value Range   Troponin i, poc 0.00  0.00 - 0.08 ng/mL   Comment 3           GLUCOSE, CAPILLARY     Status: None   Collection Time    05/26/13  6:25 PM      Result Value Range   Glucose-Capillary 74  70 - 99 mg/dL   Comment 1 Notify RN     Dg Chest 2 View  05/26/2013   *RADIOLOGY REPORT*  Clinical Data: Chest pain  CHEST - 2 VIEW  Comparison: February 15, 2013.  Findings: Cardiomediastinal silhouette appears normal.  No acute pulmonary disease is noted.  Bony thorax is intact.  IMPRESSION: No acute cardiopulmonary abnormality seen.   Original Report Authenticated By: James Green Jr.,  M.D.     ASSESSMENT:  1) USA 2) ESRD on HD 3) DM2 4) HTN 5) PAF 6) PAD s/p L BKA 7) Jehovah's Witness - no blood transfusion  PLAN/DISCUSSION:  He is quite a difficult historian and minimizes his symptoms but both his wife and I are very concerned about his symptoms particularly in light of his comorbidities. I suspect he has USA. We discussed cath vs stress testing and he wants to proceed with cath with the only issue being that he would not accept blood transfusion due to his  religious beliefs. As CP now resolved will not starrt heparin. Treat with ASA, statin, b-blocker. Cath tomorrow.   Gailene Youkhana,MD 6:55 PM    

## 2013-05-26 NOTE — ED Notes (Signed)
Ordered modified low carb diet tray per dr Anitra Lauth.

## 2013-05-26 NOTE — ED Notes (Signed)
Per ems - pt was at lawyers office today when he got substernal chest discomfort and radiated into left chest, pain went away as quickly as it has come. Pt has had very similar episodes in the past week, took Prilosec and the pain went away. BP 150/81 HR 77. ems started a 20 G in right hand.

## 2013-05-26 NOTE — ED Provider Notes (Signed)
CSN: [id number]     Arrival date & time 05/26/13  1245 History   First MD Initiated Contact with Patient 05/26/13 1249     Chief Complaint  Patient presents with  . Chest Pain   (Consider location/radiation/quality/duration/timing/severity/associated sxs/prior Treatment) Patient is a 72 y.o. male presenting with chest pain. The history is provided by the patient.  Chest Pain Pain location:  Substernal area Pain quality: aching, dull and tightness   Radiates to: left chest. Pain radiates to the back: no   Pain severity:  Moderate Onset quality:  Sudden Duration:  15 minutes Timing:  Sporadic Progression:  Resolved Chronicity:  New Context comment:  Started at rest.  has had several episodes the last few weeks but before that has been months since having pain.   Relieved by:  Nothing Worsened by:  Nothing tried Ineffective treatments:  None tried Associated symptoms: dizziness   Associated symptoms: no abdominal pain, no cough, no fever, no lower extremity edema, no nausea, no palpitations, no shortness of breath, no syncope, not vomiting and no weakness   Associated symptoms comment:  Wife states he looked flush when having the pain Risk factors: coronary artery disease, diabetes mellitus, high cholesterol and hypertension   Risk factors: not obese and no smoking   Risk factors comment:  ESRD   Past Medical History  Diagnosis Date  . Dialysis patient   . Peripheral vascular disease   . ESRD (end stage renal disease) on dialysis   . Hypertension   . Hyperlipidemia   . Arthritis     Gout  . Paroxysmal atrial fibrillation   . Brain tumor 2009  . Seizure disorder 2009  . Anemia   . Thyroid disease     hyperparathyroidism, secondary  . Diabetes mellitus without complication     Type II  . Proteus mirabilis infection   . CHF (congestive heart failure)   . CAD (coronary artery disease)   . 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  Short term memory loss related to  this. brain tumor  . GERD (gastroesophageal reflux disease)    Past Surgical History  Procedure Laterality Date  . Av fistula placement    . Below knee leg amputation  April 2012    Left below knee amputation for Osteomyelitis  . Eye surgery      cataract  . Retinopathy surgery      Hx. of laser treatments for diabetics   Family History  Problem Relation Age of Onset  . Seizures Mother   . Transient ischemic attack Mother   . Hypertension Mother   . Alcohol abuse Father   . Hypertension Father   . Arthritis Sister    History  Substance Use Topics  . Smoking status: Never Smoker   . Smokeless tobacco: Never Used  . Alcohol Use: 0.6 oz/week    1 Shots of liquor per week     Comment: 2 beers daily    Review of Systems  Constitutional: Negative for fever.  Respiratory: Negative for cough and shortness of breath.   Cardiovascular: Positive for chest pain. Negative for palpitations and syncope.  Gastrointestinal: Negative for nausea, vomiting and abdominal pain.  Neurological: Positive for dizziness. Negative for weakness.  All other systems reviewed and are negative.    Allergies  Review of patient's allergies indicates no known allergies.  Home Medications   Current Outpatient Rx  Name  Route  Sig  Dispense  Refill  . aspirin 81 MG tablet  Oral   Take 81 mg by mouth daily.         . Calcium Acetate-Magnesium Carb 450-200 MG TABS   Oral   Take 1 tablet by mouth daily.         . ciprofloxacin (CIPRO) 500 MG tablet   Oral   Take 1 tablet (500 mg total) by mouth 2 (two) times daily. One po bid x 7 days   14 tablet   0   . divalproex (DEPAKOTE ER) 500 MG 24 hr tablet   Oral   Take 2 tablets (1,000 mg total) by mouth daily.   60 tablet   12   . gabapentin (NEURONTIN) 100 MG capsule   Oral   Take 1 capsule by mouth at bedtime.         Marland Kitchen RENVELA 800 MG tablet   Oral   Take 4 tablets by mouth 3 (three) times daily with meals as needed.         .  VOLTAREN 1 % GEL   Topical   Apply 1 application topically as needed.          BP 132/69  Temp(Src) 97.5 F (36.4 C) (Oral)  Resp 12  SpO2 100% Physical Exam  Nursing note and vitals reviewed. Constitutional: He is oriented to person, place, and time. He appears well-developed and well-nourished. No distress.  HENT:  Head: Normocephalic and atraumatic.  Mouth/Throat: Oropharynx is clear and moist.  Eyes: Conjunctivae and EOM are normal. Pupils are equal, round, and reactive to light.  Neck: Normal range of motion. Neck supple.  Cardiovascular: Normal rate, regular rhythm and intact distal pulses.   No murmur heard. Pulmonary/Chest: Effort normal and breath sounds normal. No respiratory distress. He has no wheezes. He has no rales. He exhibits no tenderness.  Abdominal: Soft. He exhibits no distension. There is no tenderness. There is no rebound and no guarding.  Musculoskeletal: Normal range of motion. He exhibits no edema and no tenderness.  Left BKA.  Fistula present in the LUE  Neurological: He is alert and oriented to person, place, and time.  Skin: Skin is warm and dry. No rash noted. No erythema.  Psychiatric: He has a normal mood and affect. His behavior is normal.    ED Course  Procedures (including critical care time) Labs Review Labs Reviewed  CBC WITH DIFFERENTIAL - Abnormal; Notable for the following:    RBC 3.61 (*)    Hemoglobin 11.3 (*)    HCT 32.9 (*)    All other components within normal limits  COMPREHENSIVE METABOLIC PANEL - Abnormal; Notable for the following:    Chloride 94 (*)    Glucose, Bld 161 (*)    BUN 30 (*)    Creatinine, Ser 6.85 (*)    Albumin 3.4 (*)    GFR calc non Af Amer 7 (*)    GFR calc Af Amer 8 (*)    All other components within normal limits  VALPROIC ACID LEVEL - Abnormal; Notable for the following:    Valproic Acid Lvl 15.9 (*)    All other components within normal limits  PROTIME-INR  POCT I-STAT TROPONIN I   Imaging  Review Dg Chest 2 View  05/26/2013   *RADIOLOGY REPORT*  Clinical Data: Chest pain  CHEST - 2 VIEW  Comparison: February 15, 2013.  Findings: Cardiomediastinal silhouette appears normal.  No acute pulmonary disease is noted.  Bony thorax is intact.  IMPRESSION: No acute cardiopulmonary abnormality seen.   Original  Report Authenticated By: Lupita Raider.,  M.D.      Date: 05/26/2013  Rate: 75  Rhythm: normal sinus rhythm  QRS Axis: normal  Intervals: normal  ST/T Wave abnormalities: normal  Conduction Disutrbances: none  Narrative Interpretation: unremarkable      MDM  No diagnosis found.  Pt with symptoms concerning for ACS that have been intermittent for the last few weeks.  TIMI 4 for known CAD, risk factors and multiple episodes and ASA daily. Associated symptoms include lightheaded, and flushed.  Low suspicion for PE. ASA given and NTG held as currently CP free. EKG, CXR, CBC, BMP, CE, Coags pending.  3:45 PM Labs without acute findings however given hx feel pt needs a cardiac r/o.   [name], MD 05/26/13 1546

## 2013-05-26 NOTE — ED Notes (Signed)
Pt reports he doesn't make much urine, sts he urinates about every other day, went a little yesterday. MD made aware.

## 2013-05-26 NOTE — Plan of Care (Signed)
Problem: Consults Goal: Cardiac Cath Patient Education (See Patient Education module for education specifics.) Outcome: Completed/Met Date Met:  05/26/13 Brochure given and pt watched video

## 2013-05-26 NOTE — ED Notes (Addendum)
Pt reports he has had a few episodes of sub sternal chest discomfort of the past week (about every 3-4 days) and prior to these episodes had eaten. Reports today he didn't really eat much this morning, just a cup of coffee and muffin. Pt denies CP/sob at this morning. Pt goes to dialysis M/W/F, pt reports he went yesterday. Pt in nad, skin warm and dry, resp e/u.

## 2013-05-27 ENCOUNTER — Encounter (HOSPITAL_COMMUNITY)
Admission: EM | Disposition: A | Discharge status: Home / Self Care | Payer: Self-pay | Source: Home / Self Care | Attending: Emergency Medicine

## 2013-05-27 DIAGNOSIS — G40909 Epilepsy, unspecified, not intractable, without status epilepticus: Secondary | ICD-10-CM

## 2013-05-27 DIAGNOSIS — R079 Chest pain, unspecified: Secondary | ICD-10-CM

## 2013-05-27 DIAGNOSIS — E785 Hyperlipidemia, unspecified: Secondary | ICD-10-CM

## 2013-05-27 DIAGNOSIS — I10 Essential (primary) hypertension: Secondary | ICD-10-CM

## 2013-05-27 DIAGNOSIS — I4891 Unspecified atrial fibrillation: Secondary | ICD-10-CM

## 2013-05-27 DIAGNOSIS — N186 End stage renal disease: Secondary | ICD-10-CM

## 2013-05-27 HISTORY — PX: LEFT HEART CATHETERIZATION WITH CORONARY ANGIOGRAM: SHX5451

## 2013-05-27 LAB — LIPID PANEL
HDL: 82 mg/dL (ref >39)
LDL Cholesterol: 70 mg/dL (ref 0–99)
Total CHOL/HDL Ratio: 2.1 RATIO
VLDL: 20 mg/dL (ref 0–40)

## 2013-05-27 LAB — BASIC METABOLIC PANEL
BUN: 39 mg/dL — ABNORMAL HIGH (ref 6–23)
CO2: 25 mEq/L (ref 19–32)
Calcium: 9.5 mg/dL (ref 8.4–10.5)
Creatinine, Ser: 8.04 mg/dL — ABNORMAL HIGH (ref 0.50–1.35)
Glucose, Bld: 119 mg/dL — ABNORMAL HIGH (ref 70–99)

## 2013-05-27 LAB — TROPONIN I: Troponin I: <0.3 ng/mL (ref <0.30)

## 2013-05-27 LAB — CBC
Hemoglobin: 10.6 g/dL — ABNORMAL LOW (ref 13.0–17.0)
MCH: 31.1 pg (ref 26.0–34.0)
MCV: 90.3 fL (ref 78.0–100.0)
Platelets: 259 10*3/uL (ref 150–400)
RBC: 3.41 MIL/uL — ABNORMAL LOW (ref 4.22–5.81)

## 2013-05-27 LAB — HEPATITIS B SURFACE ANTIGEN: Hepatitis B Surface Ag: NEGATIVE

## 2013-05-27 LAB — GLUCOSE, CAPILLARY
Glucose-Capillary: 130 mg/dL — ABNORMAL HIGH (ref 70–99)
Glucose-Capillary: 98 mg/dL (ref 70–99)

## 2013-05-27 SURGERY — LEFT HEART CATHETERIZATION WITH CORONARY ANGIOGRAM
Anesthesia: Moderate Sedation | Laterality: Bilateral

## 2013-05-27 MED ORDER — MIDAZOLAM HCL 2 MG/2ML IJ SOLN
INTRAMUSCULAR | Status: AC
  Filled 2013-05-27: qty 2

## 2013-05-27 MED ORDER — SEVELAMER CARBONATE 800 MG PO TABS
3200.0000 mg | ORAL_TABLET | Freq: Three times a day (TID) | ORAL | Status: DC
  Administered 2013-05-27 – 2013-05-28 (×3): 3200 mg via ORAL
  Filled 2013-05-27 (×5): qty 4

## 2013-05-27 MED ORDER — DOXERCALCIFEROL 4 MCG/2ML IV SOLN
4.0000 ug | INTRAVENOUS | Status: DC
Start: 2013-05-30 — End: 2013-05-28
  Administered 2013-05-27: 4 ug via INTRAVENOUS

## 2013-05-27 MED ORDER — RENA-VITE PO TABS
1.0000 | ORAL_TABLET | Freq: Every day | ORAL | Status: DC
Start: 2013-05-27 — End: 2013-05-28
  Administered 2013-05-27: 1 via ORAL
  Filled 2013-05-27 (×2): qty 1

## 2013-05-27 MED ORDER — DOXERCALCIFEROL 4 MCG/2ML IV SOLN
INTRAVENOUS | Status: AC
  Administered 2013-05-27: 4 ug via INTRAVENOUS
  Filled 2013-05-27: qty 2

## 2013-05-27 MED ORDER — SODIUM CHLORIDE 0.9 % IV SOLN: 100.0000 mL | INTRAVENOUS | Status: DC | PRN

## 2013-05-27 MED ORDER — LIDOCAINE-PRILOCAINE 2.5-2.5 % EX CREA: 1.0000 "application " | TOPICAL_CREAM | CUTANEOUS | Status: DC | PRN

## 2013-05-27 MED ORDER — PENTAFLUOROPROP-TETRAFLUOROETH EX AERO: 1.0000 "application " | INHALATION_SPRAY | CUTANEOUS | Status: DC | PRN

## 2013-05-27 MED ORDER — FENTANYL CITRATE 0.05 MG/ML IJ SOLN
INTRAMUSCULAR | Status: AC
  Filled 2013-05-27: qty 2

## 2013-05-27 MED ORDER — HEPARIN (PORCINE) IN NACL 2-0.9 UNIT/ML-% IJ SOLN
INTRAMUSCULAR | Status: AC
  Filled 2013-05-27: qty 1000

## 2013-05-27 MED ORDER — NEPRO/CARBSTEADY PO LIQD: 237.0000 mL | ORAL | Status: DC | PRN

## 2013-05-27 MED ORDER — LIDOCAINE HCL (PF) 1 % IJ SOLN: 5.0000 mL | INTRAMUSCULAR | Status: DC | PRN

## 2013-05-27 MED ORDER — LIDOCAINE HCL (PF) 1 % IJ SOLN
INTRAMUSCULAR | Status: AC
  Filled 2013-05-27: qty 30

## 2013-05-27 MED ORDER — HEPARIN SODIUM (PORCINE) 1000 UNIT/ML DIALYSIS: 1000.0000 [IU] | INTRAMUSCULAR | Status: DC | PRN

## 2013-05-27 MED ORDER — NITROGLYCERIN 0.2 MG/ML ON CALL CATH LAB
INTRAVENOUS | Status: AC
  Filled 2013-05-27: qty 1

## 2013-05-27 MED ORDER — ALTEPLASE 2 MG IJ SOLR: 2.0000 mg | Freq: Once | INTRAMUSCULAR | Status: DC | PRN

## 2013-05-27 NOTE — Interval H&P Note (Signed)
History and Physical Interval Note:  05/27/2013 10:59 AM  [name] [name]  has presented today for cardiac cath with the diagnosis of chest pain. The various methods of treatment have been discussed with the patient and family. After consideration of risks, benefits and other options for treatment, the patient has consented to  Procedure(s): LEFT HEART CATHETERIZATION WITH CORONARY ANGIOGRAM (Bilateral) as a surgical intervention .  The patient's history has been reviewed, patient examined, no change in status, stable for surgery.  I have reviewed the patient's chart and labs.  Questions were answered to the patient's satisfaction.    Cath Lab Visit (complete for each Cath Lab visit)  Clinical Evaluation Leading to the Procedure:   ACS: no  Non-ACS:    Anginal Classification: CCS III  Anti-ischemic medical therapy: Minimal Therapy (1 class of medications)  Non-Invasive Test Results: No non-invasive testing performed  Prior CABG: No previous CABG         Roshana Shuffield

## 2013-05-27 NOTE — Progress Notes (Signed)
[facility]                                            History and Physical   [name] [name] EAV:[id number] DOB: Feb 07, 1941 DOA: 05/26/2013   Referring physician:  [name] PCP:  [name], MD    Chief Complaint:  Chest pressure   HPI:  72 y.o. BM PMHx obesity, HTN, HLD, DM Type 2, PVD, PAF, grade 1 diastolic heart failure, ESRD (due to HTN/ DM - related nephrotic syndrome 2011), oligoastrocytoma s/p resection in 2009, seizure disorder, who presents with substernal chest pressure.  The patient was last at their baseline health a few weeks ago.  The patient has had 3-4 episodes of 4 out of 10 substernal chest pressure with radiation to the left chest, left jaw, and left shoulder which come on at rest and lasts for 10-15 minutes.  His first episode was 1-1.5 weeks ago and he has had an episode every few days.  They resolve spontaneously. He has associated flushing, lightheadedness, blurry vision.  He denies shortness of breath, however the EMS provider to pick him up from his clinic today noted that he appeared dyspneic.  He denies nausea but does have an indigestion feeling.  Denies DOE, PND, orthopnea, palpitations.  He attributed his pain to acid reflux and started taking a few tabs of Prilosec the last few days. He was at clinic today when he had an episode of chest pain and the clinic called EMS to transport him to the emergency department.   He stated he had a stress test since starting HD possible at [facility], but I could not find the stress test in the [facility] records.    Note:  Patient is a poor historian.  He tells me he has been on HD for about 1 year, however, records show he has been on HD for probably 3 years.    In the emergency department, labs were consistent with end-stage renal disease, initial troponin was negative, ECG demonstrate stated normal sinus rhythm with stable inverted T wave in aVL, stable 1  mm ST segment elevation in V1, and the 2. No other ST segment depressions or elevations or inverted T waves to suggest acute ischemia.  CXR without acute changes.  He is being admitted for further evaluation of his chest pain.  TODAY patient is S/P cardiac catheterization. Patient states had one Brief episod chest pressure today lasted less than a minute. Negative SOB, negative N./V., negative diaphoresis. Patient will have his hemodialysis this afternoon and should be ready for discharge in the morning    Procedure Cardiac catheterization 05/27/2013 Impression:  1. No angiographic evidence of CAD  2. Normal LV systolic function  3. Non-cardiac chest pain        Past Medical History   Diagnosis  Date   .  Dialysis patient     .  Peripheral vascular disease     .  ESRD (end stage renal disease) on dialysis  09/2010       MWF   .  Hypertension     .  Hyperlipidemia     .  Arthritis         Gout   .  Paroxysmal atrial fibrillation     .  Brain tumor  2009   .  Seizure disorder  2009   .  Anemia     .  Hyperparathyroidism, secondary     .  Diabetes mellitus without complication         Type II, diet controlled   .  Proteus mirabilis infection     .  CHF (congestive heart failure)         grade 1 DD, preserved EF, [facility] records   .  CAD (coronary artery disease)         reports that last stress test was 1-2 years ago at [facility] and was negative, but have not found these records.     .  Mixed oligoastrocytoma  2009       with resection.  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 brain tumor   .  GERD (gastroesophageal reflux disease)      Past Surgical History   Procedure  Laterality  Date   .  Av fistula placement  Left         arm   .  Below knee leg amputation  Left  April 2012       Left below knee amputation for Osteomyelitis   .  Eye surgery  Bilateral  2000       cataract   .  Retinopathy surgery           Hx. of laser treatments for diabetics    Social History:  reports that he has never smoked. He has never used smokeless tobacco. He reports that he drinks about 0.6 ounces of alcohol per week. He reports that he does not use illicit drugs. Pt lives at home with spouse. Caffeine Use: 1-2 cups daily. Uses a cane for ambulation and prosthetic limb  Left leg.      No Known Allergies    Family History   Problem  Relation  Age of Onset   .  Seizures  Mother     .  Stroke  Mother  71   .  Hypertension  Mother     .  Alcohol abuse  Father     .  Hypertension  Father     .  Arthritis  Sister     .  Heart attack  Neg Hx     .  Heart failure  Neg Hx         Prior to Admission medications    Medication  Sig  Start Date  End Date  Taking?  Authorizing Provider   aspirin 81 MG tablet  Take 81 mg by mouth daily.      Yes  Historical Provider, MD   divalproex (DEPAKOTE ER) 500 MG 24 hr tablet  Take 2 tablets (1,000 mg total) by mouth daily.  02/10/13    Yes  Suanne Marker, MD   gabapentin (NEURONTIN) 100 MG capsule  Take 1 capsule by mouth at bedtime.  01/21/13    Yes  Historical Provider, MD   omeprazole (PRILOSEC OTC) 20 MG tablet  Take 20 mg by mouth daily as needed (for heartburn).      Yes  Historical Provider, MD   RENVELA 800 MG tablet  Take 1,600 mg by mouth 2 (two) times daily before a meal.   03/01/13    Yes  Historical Provider, MD      Physical Exam: Filed Vitals:     05/26/13 1500  05/26/13 1545  05/26/13 1600  05/26/13 1656   BP:  139/76      140/71   Pulse:  65  65  65  71   Temp:  97.7 F (36.5 C)   TempSrc:        Oral   Resp:  8  18  13  18    Height:        6\' 4"  (1.93 m)   Weight:        109.09 kg (240 lb 8 oz)   SpO2:  99%  100%  98%  100%       General:A/O x 4, NAD Eyes:  PERRL, anicteric, non-injected. Cardiovascular:   ago rhythm and rate, negative murmurs rubs or gallops DP/PT pulse right leg 1+  Respiratory:  CTA bilaterally without increased WOB. Abdomen:   plus bowel sounds .  Soft, ND/NT.     Musculoskeletal:   left arm fistula present, left BKA present   Labs on Admission:  Basic Metabolic Panel: Recent Labs Lab  05/26/13 1310   NA  136   K  3.8   CL  94*   CO2  26   GLUCOSE  161*   BUN  30*   CREATININE  6.85*   CALCIUM  9.7    Liver Function Tests: Recent Labs Lab  05/26/13 1310   AST  13   ALT  11   ALKPHOS  77   BILITOT  0.3   PROT  6.8   ALBUMIN  3.4*    No results found for this basename: LIPASE, AMYLASE,  in the last 168 hours No results found for this basename: AMMONIA,  in the last 168 hours CBC: Recent Labs Lab  05/26/13 1310   WBC  6.3   NEUTROABS  3.1   HGB  11.3*   HCT  32.9*   MCV  91.1   PLT  242     BNP (last 3 results) No results found for this basename: PROBNP,  in the last 8760 hours CBG: No results found for this basename: GLUCAP,  in the last 168 hours   Radiological Exams on Admission: Dg Chest 2 View   05/26/2013   *RADIOLOGY REPORT*  Clinical Data: Chest pain  CHEST - 2 VIEW  Comparison: February 15, 2013.  Findings: Cardiomediastinal silhouette appears normal.  No acute pulmonary disease is noted.  Bony thorax is intact.  IMPRESSION: No acute cardiopulmonary abnormality seen.   Original Report Authenticated By: Lupita Raider., M.D.     EKG: Independently reviewed. normal sinus rhythm with stable inverted T wave in aVL, stable 1 mm ST segment elevation in V1, and the 2. No other ST segment depressions or elevations or inverted T waves to suggest acute ischemia   Assessment/Plan Principal Problem:   Chest pressure Active Problems:   Atherosclerotic peripheral vascular disease with gangrene   Complex partial seizures   ESRD (end stage renal disease) on dialysis   Hypertension   Hyperlipidemia   Paroxysmal atrial fibrillation   Mixed oligoastrocytoma   Diabetes mellitus without complication   GERD (gastroesophageal reflux disease) ---     Chest pain;  sounds suggestive of unstable angina:  status post cardiac  catheterization, with normal vessels.  --Discharge in the a.m.      ESRD, M/W/F schedule. -   patient will obtain HD this afternoon  -Discharge in the a.m..     Secondary hyperparathyroidism; due to ESRD, stable.  Continue renvela.   Anemia; due to renal parenchymal disease.  Hgb stable.  Epo dosing per nephrology.   PVD; , stable.  Continue ASA   HTN,; within ADA guidelines continue Metoprolol   HLD, within ADA guidelines  -  continue Atorvastatin   PAF, currently sinus rhythm on telemetry.  Not on  Anticoagulant    Diabetes type 2; diet controlled, but glucose 161 -  05/26/2013 hemoglobin A1c= 7.1 patient will need to address diabetic treatment with PCP  -   continue low dose SSI  while hospitalized    Hx of oligoastrocytoma and complex partial seizures.  Seizure free.  Continue depakote.     GERD, start daily PPI.     Diet:  Renal Access:  PIV IVF:  OFF Proph:  Heparin    Code Status: full Family Communication: patient and wife Disposition Plan: Admit to telemetry   Time spent: 60 min Carolyne Littles, MD [facility] Pager [phone]   If 7PM-7AM, please contact night-coverage [email or web address] Password [facility] 05/26/2013, 5:12 PM

## 2013-05-27 NOTE — CV Procedure (Signed)
   Cardiac Catheterization Operative Report  [name] [name] [id number] 9/12/201411:02 AM [name], MD  Procedure Performed:  1. Left Heart Catheterization 2. Selective Coronary Angiography 3. Left ventricular angiogram  Operator: Verne Carrow, MD  Indication:  72 y.o. year-old male (Jehov[employer]'s Witness) with history of obesity, HTN, HLD, T2DM, PAD s/p LBKA, PAF, grade 1 diastolic heart failure, ESRD (due to HTN/ DM - related nephrotic syndrome 2011), oligoastrocytoma s/p resection in 2009, seizure disorder, who presents with substernal chest pressure.  Denies h/o known CAD. Had nuclear stress test at [facility] over 2-3 years ago which was normal. Cardiac markers have been negative. Concern for unstable angina (class III). Cardiac cath to exclude obstructive CAD.                                      Procedure Details: The risks, benefits, complications, treatment options, and expected outcomes were discussed with the patient. The patient and/or family concurred with the proposed plan, giving informed consent. The patient was brought to the cath lab after IV hydration was begun and oral premedication was given. The patient was further sedated with Versed and Fentanyl. The right groin was prepped and draped in the usual manner. Using the modified Seldinger access technique, a 5 French sheath was placed in the right femoral artery. A 4 French sheath was placed in the right femoral vein for IV access since he had no peripheral IV access and we could not obtain peripheral venous access in the cath lab. Standard diagnostic catheters were used to perform selective coronary angiography. A pigtail catheter was used to perform a left ventricular angiogram.  There were no immediate complications. The patient was taken to the recovery area in stable condition.   Hemodynamic Findings: Central aortic pressure: 113/58 Left ventricular pressure: 115/5/7  Angiographic  Findings:  Left main: No obstructive disease.   Left Anterior Descending Artery: Large caliber vessel that courses to the apex. There is a large diagonal branch. No obstructive disease.   Circumflex Artery: Large caliber vessel with large obtuse marginal branch. No obstructive disease.   Right Coronary Artery: Large dominant vessel with no obstructive disease.   Left Ventricular Angiogram: LVEF=65%  Impression: 1. No angiographic evidence of CAD 2. Normal LV systolic function 3. Non-cardiac chest pain  Recommendations: No further ischemic workup. OK to discharge home from a cardiac perspective.        Complications:  None. The patient tolerated the procedure well.

## 2013-05-27 NOTE — H&P (View-Only) (Signed)
Referring Physician: Dr. Malachi Bonds ([facility]) Primary Physician: Primary Cardiologist: New Reason for Consultation: CP    HPI:  [name] is a 72 y.o. year-old male ([employer]ov[employer] Witness) with history of obesity, HTN, HLD, T2DM, PAD s/p LBKA, PAF, grade 1 diastolic heart failure, ESRD (due to HTN/ DM - related nephrotic syndrome 2011), oligoastrocytoma s/p resection in 2009, seizure disorder, who presents with substernal chest pressure.  Denies h/o known CAD. Had nuclear stress test at [facility] over 2-3 years ago which was normal. (unable to find this in Care Everywhere)   Says over last week has had 3-4 episodes of 4 out of 10 substernal chest pressure with radiation to the  left jaw, which come on at rest and lasts for 10-15 minutes. His first episode was 1-1.5 weeks ago and he has had an episode every few days. They resolve spontaneously. He has associated flushing, lightheadedness, blurry vision. He denies shortness of breath, however the EMS provider to pick him up from his clinic today noted that he appeared dyspneic. His wife also says that he gets dyspneic on taking trash out. He denies nausea but does have an indigestion feeling. Denies, PND, orthopnea, palpitations. He attributed his pain to acid reflux and started taking a few tabs of Prilosec the last few days. He was at clinic today when he had an episode of chest pain and the clinic called EMS to transport him to the emergency department. His wife says CP has been getting worse over the past week.   In the emergency department, labs were consistent with end-stage renal disease, initial troponin was negative, ECG  normal sinus rhythm no ST changes. CXR without acute changes. He is being admitted for further evaluation of his chest pain.    Review of Systems:     Cardiac Review of Systems: {Y] = yes [ ]  = no  Chest Pain [ y   ]  Resting SOB [   ] Exertional SOB  Cove.Etienne  ]  Orthopnea [  ]   Pedal Edema [   ]    Palpitations [   ] Syncope  [  ]   Presyncope [   ]  General Review of Systems: [Y] = yes [  ]=no Constitional: recent weight change [  ]; anorexia [  ]; fatigue [  ]; nausea [  ]; night sweats [  ]; fever [  ]; or chills [  ];                                                                                                                                          Dental: poor dentition[  ];   Eye : blurred vision [  ]; diplopia [   ]; vision changes [  ];  Amaurosis fugax[  ]; Resp: cough [  ];  wheezing[  ];  hemoptysis[  ]; shortness of  breath[  ]; paroxysmal nocturnal dyspnea[  ]; dyspnea on exertion[  ]; or orthopnea[  ];  GI:  gallstones[  ], vomiting[  ];  dysphagia[  ]; melena[  ];  hematochezia [  ]; heartburn[ y ];   GU: kidney stones [  ]; hematuria[  ];   dysuria [  ];  nocturia[  ];  history of     obstruction [  ];                 Skin: rash, swelling[  ];, hair loss[  ];  peripheral edema[  ];  or itching[  ]; Musculosketetal: myalgias[  ];  joint swelling[  ];  joint erythema[  ];  joint pain[  ];  back pain[  ];  Heme/Lymph: bruising[  ];  bleeding[  ];  anemia[  ];  Neuro: TIA[  ];  headaches[  ];  stroke[  ];  vertigo[  ];  seizures[  ];   paresthesias[  y];  difficulty walking[  y];  Psych:depression[  ]; anxiety[  ];  Endocrine: diabetes[ y ];  thyroid dysfunction[  ];  Other:  Past Medical History  Diagnosis Date  . Dialysis patient   . Peripheral vascular disease   . ESRD (end stage renal disease) on dialysis 09/2010    MWF  . Hypertension   . Hyperlipidemia   . Arthritis     Gout  . Paroxysmal atrial fibrillation   . Brain tumor 2009  . Seizure disorder 2009  . Anemia   . Hyperparathyroidism, secondary   . Diabetes mellitus without complication     Type II, diet controlled  . Proteus mirabilis infection   . CHF (congestive heart failure)     grade 1 DD, preserved EF, [facility] records  . CAD (coronary artery disease)     reports that last stress test was 1-2 years ago at [facility] and was negative, but have not found these records.    . 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 brain tumor  . GERD (gastroesophageal reflux disease)     Medications Prior to Admission  Medication Sig Dispense Refill  . aspirin 81 MG tablet Take 81 mg by mouth daily.      . divalproex (DEPAKOTE ER) 500 MG 24 hr tablet Take 2 tablets (1,000 mg total) by mouth daily.  60 tablet  12  . gabapentin (NEURONTIN) 100 MG capsule Take 1 capsule by mouth at bedtime.      Marland Kitchen omeprazole (PRILOSEC OTC) 20 MG tablet Take 20 mg by mouth daily as needed (for heartburn).      . RENVELA 800 MG tablet Take 1,600 mg by mouth 2 (two) times daily before a meal.          . aspirin EC  81 mg Oral Daily  . atorvastatin  40 mg Oral q1800  . divalproex  1,000 mg Oral Daily  . gabapentin  100 mg Oral QHS  . heparin  5,000 Units Subcutaneous Q8H  . insulin aspart  0-9 Units Subcutaneous TID WC  . metoprolol tartrate  12.5 mg Oral BID  . [START ON 05/27/2013] sevelamer carbonate  1,600 mg Oral BID AC  . sodium chloride  3 mL Intravenous Q12H    Infusions:    No Known Allergies  History   Social History  . Marital Status: Married    Spouse Name: [name] [name]    Number of Children: 3  .  Years of Education: 12th   Occupational History  .      N/A   Social History Main Topics  . Smoking status: Never Smoker   . Smokeless tobacco: Never Used  . Alcohol Use: 0.6 oz/week    1 Shots of liquor per week     Comment: 2-4 beers daily    . Drug Use: No  . Sexual Activity: Not on file   Other Topics Concern  . Not on file   Social History Narrative   Pt lives at home with spouse.   Caffeine Use: 1-2 cups daily.   Uses a cane for ambulation and prosthetic limb  Left leg.      Family History  Problem Relation Age of Onset  . Seizures Mother   . Stroke Mother 71  . Hypertension Mother   . Alcohol abuse Father   . Hypertension Father   .  Arthritis Sister   . Heart attack Neg Hx   . Heart failure Neg Hx     PHYSICAL EXAM: Filed Vitals:   05/26/13 1656  BP: 140/71  Pulse: 71  Temp: 97.7 F (36.5 C)  Resp: 18    No intake or output data in the 24 hours ending 05/26/13 1834  General:  Well developed No respiratory difficulty HEENT: normal Neck: supple. no JVD. Carotids 2+ bilat; no bruits. No lymphadenopathy or thryomegaly appreciated. Cor: PMI nondisplaced. Regular rate & rhythm. No rubs, gallops or murmurs. Lungs: clear Abdomen: soft, nontender, nondistended. No hepatosplenomegaly. No bruits or masses. Good bowel sounds. Extremities: no cyanosis, clubbing, rash, edema s/p L BKA. RUE AVF Neuro: alert & oriented x 3, cranial nerves grossly intact. moves all 4 extremities w/o difficulty. Affect pleasant.  ECG: NSR 75 No ST-T wave abnormalities.    Results for orders placed during the hospital encounter of 05/26/13 (from the past 24 hour(s))  CBC WITH DIFFERENTIAL     Status: Abnormal   Collection Time    05/26/13  1:10 PM      Result Value Range   WBC 6.3  4.0 - 10.5 K/uL   RBC 3.61 (*) 4.22 - 5.81 MIL/uL   Hemoglobin 11.3 (*) 13.0 - 17.0 g/dL   HCT 16.1 (*) 09.6 - 04.5 %   MCV 91.1  78.0 - 100.0 fL   MCH 31.3  26.0 - 34.0 pg   MCHC 34.3  30.0 - 36.0 g/dL   RDW 40.9  81.1 - 91.4 %   Platelets 242  150 - 400 K/uL   Neutrophils Relative % 50  43 - 77 %   Neutro Abs 3.1  1.7 - 7.7 K/uL   Lymphocytes Relative 36  12 - 46 %   Lymphs Abs 2.2  0.7 - 4.0 K/uL   Monocytes Relative 12  3 - 12 %   Monocytes Absolute 0.8  0.1 - 1.0 K/uL   Eosinophils Relative 2  0 - 5 %   Eosinophils Absolute 0.1  0.0 - 0.7 K/uL   Basophils Relative 1  0 - 1 %   Basophils Absolute 0.0  0.0 - 0.1 K/uL  COMPREHENSIVE METABOLIC PANEL     Status: Abnormal   Collection Time    05/26/13  1:10 PM      Result Value Range   Sodium 136  135 - 145 mEq/L   Potassium 3.8  3.5 - 5.1 mEq/L   Chloride 94 (*) 96 - 112 mEq/L   CO2 26  19 -  32 mEq/L  Glucose, Bld 161 (*) 70 - 99 mg/dL   BUN 30 (*) 6 - 23 mg/dL   Creatinine, Ser 8.11 (*) 0.50 - 1.35 mg/dL   Calcium 9.7  8.4 - 91.4 mg/dL   Total Protein 6.8  6.0 - 8.3 g/dL   Albumin 3.4 (*) 3.5 - 5.2 g/dL   AST 13  0 - 37 U/L   ALT 11  0 - 53 U/L   Alkaline Phosphatase 77  39 - 117 U/L   Total Bilirubin 0.3  0.3 - 1.2 mg/dL   GFR calc non Af Amer 7 (*) >90 mL/min   GFR calc Af Amer 8 (*) >90 mL/min  PROTIME-INR     Status: None   Collection Time    05/26/13  1:10 PM      Result Value Range   Prothrombin Time 12.7  11.6 - 15.2 seconds   INR 0.97  0.00 - 1.49  VALPROIC ACID LEVEL     Status: Abnormal   Collection Time    05/26/13  1:10 PM      Result Value Range   Valproic Acid Lvl 15.9 (*) 50.0 - 100.0 ug/mL  POCT I-STAT TROPONIN I     Status: None   Collection Time    05/26/13  2:34 PM      Result Value Range   Troponin i, poc 0.00  0.00 - 0.08 ng/mL   Comment 3           GLUCOSE, CAPILLARY     Status: None   Collection Time    05/26/13  6:25 PM      Result Value Range   Glucose-Capillary 74  70 - 99 mg/dL   Comment 1 Notify RN     Dg Chest 2 View  05/26/2013   *RADIOLOGY REPORT*  Clinical Data: Chest pain  CHEST - 2 VIEW  Comparison: February 15, 2013.  Findings: Cardiomediastinal silhouette appears normal.  No acute pulmonary disease is noted.  Bony thorax is intact.  IMPRESSION: No acute cardiopulmonary abnormality seen.   Original Report Authenticated By: Lupita Raider.,  M.D.     ASSESSMENT:  1) [place] 2) ESRD on HD 3) DM2 4) HTN 5) PAF 6) PAD s/p L BKA 7) Jehovah's Witness - no blood transfusion  PLAN/DISCUSSION:  He is quite a difficult historian and minimizes his symptoms but both his wife and I are very concerned about his symptoms particularly in light of his comorbidities. I suspect he has [place]. We discussed cath vs stress testing and he wants to proceed with cath with the only issue being that he would not accept blood transfusion due to his  religious beliefs. As CP now resolved will not starrt heparin. Treat with ASA, statin, b-blocker. Cath tomorrow.   Truman Hayward 6:55 PM

## 2013-05-27 NOTE — Consult Note (Signed)
[facility] Renal Consultation Note    Indication for Consultation:  Management of ESRD/hemodialysis; anemia, hypertension/volume and secondary hyperparathyroidism  HPI: [name] [name] is a 72 y.o. male with ESRD secondary to DM and HTN on MWF dialysis at [facility] is s/p heart cath today done due to presentation last pm with SSCP and exertional SOB.  Prior to this, no known hx CAD with negative stress test 2-3 years ago.  Heart cath today showed neg for CAD, normal LV systolic fx - CP deemed noncardiac. He has chronic knee pain and constipation and takes prn Prilosec for acid reflux.  Past Medical History  Diagnosis Date  . Dialysis patient   . Peripheral vascular disease   . ESRD (end stage renal disease) on dialysis 09/2010    MWF  . Hypertension   . Hyperlipidemia   . Arthritis     Gout  . Paroxysmal atrial fibrillation   . Brain tumor 2009  . Seizure disorder 2009  . Anemia   . Hyperparathyroidism, secondary   . Diabetes mellitus without complication     Type II, diet controlled  . Proteus mirabilis infection   . CHF (congestive heart failure)     grade 1 DD, preserved EF, [facility] records  . CAD (coronary artery disease)     reports that last stress test was 1-2 years ago at [facility] and was negative, but have not found these records.    . 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 brain tumor  . GERD (gastroesophageal reflux disease)    Past Surgical History  Procedure Laterality Date  . Av fistula placement Left     arm  . 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 Eye surgery Bilateral 2000    cataract  . Retinopathy surgery      Hx. of laser treatments for diabetics   Family History  Problem Relation Age of Onset  . Seizures Mother   . Stroke Mother 69  . Hypertension Mother   . Alcohol abuse Father   . Hypertension Father   . Arthritis Sister   .  Heart attack Neg Hx   . Heart failure Neg Hx    Social History:  reports that he has never smoked. He has never used smokeless tobacco. He reports that he drinks about 0.6 ounces of alcohol per week. He reports that he does not use illicit drugs. No Known Allergies Prior to Admission medications   Medication Sig Start Date End Date Taking? Authorizing Provider  aspirin 81 MG tablet Take 81 mg by mouth daily.   Yes Historical Provider, MD  divalproex (DEPAKOTE ER) 500 MG 24 hr tablet Take 2 tablets (1,000 mg total) by mouth daily. 02/10/13  Yes Suanne Marker, MD  gabapentin (NEURONTIN) 100 MG capsule Take 1 capsule by mouth at bedtime. 01/21/13  Yes Historical Provider, MD  omeprazole (PRILOSEC OTC) 20 MG tablet Take 20 mg by mouth daily as needed (for heartburn).   Yes Historical Provider, MD  RENVELA 800 MG tablet Take 1,600 mg by mouth 2 (two) times daily before a meal.  03/01/13  Yes Historical Provider,  PT TAKES 4 RENVELA AC   Current Facility-Administered Medications  Medication Dose Route Frequency Provider Last Rate Last Dose  . 0.9 %  sodium chloride infusion  250 mL Intravenous PRN Renae Fickle, MD      . 0.9 %  sodium chloride infusion  100 mL Intravenous PRN Sheffield Slider, PA-C      . 0.9 %  sodium chloride infusion  100 mL Intravenous PRN Sheffield Slider, PA-C      . acetaminophen (TYLENOL) tablet 650 mg  650 mg Oral Q4H PRN Renae Fickle, MD      . alteplase (CATHFLO ACTIVASE) injection 2 mg  2 mg Intracatheter Once PRN Sheffield Slider, PA-C      . aspirin EC tablet 81 mg  81 mg Oral Daily Renae Fickle, MD   81 mg at 05/26/13 1824  . atorvastatin (LIPITOR) tablet 40 mg  40 mg Oral q1800 Renae Fickle, MD   40 mg at 05/26/13 1824  . divalproex (DEPAKOTE ER) 24 hr tablet 1,000 mg  1,000 mg Oral Daily Renae Fickle, MD   1,000 mg at 05/26/13 1825  . feeding supplement (NEPRO CARB STEADY) liquid 237 mL  237 mL Oral PRN Sheffield Slider, PA-C      . gabapentin  (NEURONTIN) capsule 100 mg  100 mg Oral QHS Renae Fickle, MD   100 mg at 05/26/13 2206  . heparin injection 1,000 Units  1,000 Units Dialysis PRN Sheffield Slider, PA-C      . insulin aspart (novoLOG) injection 0-9 Units  0-9 Units Subcutaneous TID WC Renae Fickle, MD   1 Units at 05/27/13 [phone]  . lidocaine (PF) (XYLOCAINE) 1 % injection 5 mL  5 mL Intradermal PRN Sheffield Slider, PA-C      . lidocaine-prilocaine (EMLA) cream 1 application  1 application Topical PRN Sheffield Slider, PA-C      . metoprolol tartrate (LOPRESSOR) tablet 12.5 mg  12.5 mg Oral BID Renae Fickle, MD   12.5 mg at 05/26/13 2206  . multivitamin (RENA-VIT) tablet 1 tablet  1 tablet Oral QHS Sheffield Slider, PA-C      . nitroGLYCERIN (NITROSTAT) SL tablet 0.4 mg  0.4 mg Sublingual Q5 Min x 3 PRN Renae Fickle, MD      . ondansetron [facility]) injection 4 mg  4 mg Intravenous Q6H PRN Renae Fickle, MD      . pantoprazole (PROTONIX) EC tablet 40 mg  40 mg Oral Daily PRN Renae Fickle, MD      . pentafluoroprop-tetrafluoroeth (GEBAUERS) aerosol 1 application  1 application Topical PRN Sheffield Slider, PA-C      . sevelamer carbonate (RENVELA) tablet 1,600 mg  1,600 mg Oral BID AC Renae Fickle, MD      . sodium chloride 0.9 % injection 3 mL  3 mL Intravenous Q12H Renae Fickle, MD   3 mL at 05/27/13 1042  . sodium chloride 0.9 % injection 3 mL  3 mL Intravenous PRN Renae Fickle, MD       Labs: Basic Metabolic Panel:  Recent Labs Lab 05/26/13 1310 05/26/13 1730 05/27/13 0530  NA 136  --  137  K 3.8  --  3.6  CL 94*  --  96  CO2 26  --  25  GLUCOSE 161*  --  119*  BUN 30*  --  39*  CREATININE 6.85* 7.24* 8.04*  CALCIUM 9.7  --  9.5   Liver Function Tests:  Recent Labs Lab 05/26/13 1310  AST 13  ALT 11  ALKPHOS 77  BILITOT 0.3  PROT 6.8  ALBUMIN 3.4*   No results found for this basename: LIPASE, AMYLASE,  in the last 168 hours No results found for this basename: AMMONIA,  in the  last 168 hours CBC:  Recent Labs Lab 05/26/13 1310 05/26/13 1730 05/27/13 0530  WBC 6.3 6.9 5.9  NEUTROABS 3.1  --   --   HGB 11.3* 11.7* 10.6*  HCT 32.9* 33.2* 30.8*  MCV 91.1 90.5 90.3  PLT 242 255 259   Cardiac Enzymes:  Recent Labs Lab 05/26/13 1700 05/26/13 2349 05/27/13 0530  TROPONINI <0.30 <0.30 <0.30   CBG:  Recent Labs Lab 05/26/13 1825 05/26/13 2217 05/27/13 0748 05/27/13 1306  GLUCAP 74 138* 130* 98   Studies/Results: Dg Chest 2 View  05/26/2013   *RADIOLOGY REPORT*  Clinical Data: Chest pain  CHEST - 2 VIEW  Comparison: February 15, 2013.  Findings: Cardiomediastinal silhouette appears normal.  No acute pulmonary disease is noted.  Bony thorax is intact.  IMPRESSION: No acute cardiopulmonary abnormality seen.   Original Report Authenticated By: Lupita Raider.,  M.D.   ROS: neg except as per HPI  Physical Exam: Filed Vitals:   05/26/13 1656 05/26/13 2206 05/27/13 0537 05/27/13 1308  BP: 140/71 153/77 113/66 124/67  Pulse: 71 67 64 62  Temp: 97.7 F (36.5 C) 97.8 F (36.6 C) 97.9 F (36.6 C) 97.3 F (36.3 C)  TempSrc: Oral Oral Oral Oral  Resp: 18 18 18 18   Height: 6\' 4"  (1.93 m)     Weight: 109.09 kg (240 lb 8 oz)  113.4 kg (250 lb)   SpO2: 100% 100% 98% 100%     General: Well developed, well nourished, in no acute distress. Head: Normocephalic, atraumatic, sclera non-icteric, mucus membranes are moist Neck: Supple. JVD not elevated. Lungs: Clear bilaterally to auscultation without wheezes, rales, or rhonchi. Breathing is unlabored. Heart: RRR with S1 S2. No murmurs, rubs, or gallops appreciated. Abdomen: Soft, non-tender, non-distended with normoactive bowel sounds.  M-S:  Strength and tone appear normal for age. Lower extremities: left BKA wearing stump shrinker; right LE no edema or wounds. Neuro: Alert and oriented X 3. Moves all extremities spontaneously. Psych:  Responds to questions appropriately with a normal affect. Dialysis Access:  left upper AVF Qb 400  Dialysis Orders: Center:[facility] 4.5 hr 180 EDW 112.5 400/800 2 K 2 Ca left upper AVF Epo 1200 per week; heparin 8000 hectorol 4  Assessment/Plan: 1.  Non cardiac CP - neg cath; no heparin HD post cath; PPI 2.  ESRD -  HD per routine 3.  Hypertension/volume  - volume control 4.  Anemia  - Hgb 10.6 - no ESA 5.  Metabolic bone disease -  hectorol per routine; increase renvela to 4 AC - his usual outpt dose 6.  Nutrition - renal diet 7.  Disp - prob d/c today  Sheffield Slider, PA-C [facility] Beeper [phone] 05/27/2013, 3:10 PM   Patient seen and examined.  Agree with assessment and plan as above.  ESRD patient with CAD risk factors admitted with chest pain underwent heart cath today which was negative for obstructive CAD.  He is feeling better, up 2-3 kg over dry wt, anticipate d/c after HD later today most likely per primary team.  Vinson Moselle  MD Pager [phone]    Cell  [phone] 05/27/2013, 4:21 PM

## 2013-05-28 DIAGNOSIS — I517 Cardiomegaly: Secondary | ICD-10-CM

## 2013-05-28 LAB — GLUCOSE, CAPILLARY: Glucose-Capillary: 114 mg/dL — ABNORMAL HIGH (ref 70–99)

## 2013-05-28 MED ORDER — METOPROLOL TARTRATE 12.5 MG HALF TABLET: 12.5000 mg | ORAL_TABLET | Freq: Two times a day (BID) | ORAL | Status: DC

## 2013-05-28 MED ORDER — NITROGLYCERIN 0.4 MG SL SUBL: 0.4000 mg | SUBLINGUAL_TABLET | SUBLINGUAL | Status: DC | PRN

## 2013-05-28 MED ORDER — ATORVASTATIN CALCIUM 40 MG PO TABS: 40.0000 mg | ORAL_TABLET | Freq: Every day | ORAL | Status: DC

## 2013-05-28 NOTE — Progress Notes (Signed)
  Echocardiogram 2D Echocardiogram has been performed.  [place]ian Co 05/28/2013, 9:01 AM

## 2013-05-28 NOTE — Discharge Summary (Signed)
./ Physician Discharge Summary  [name] [name] WGN:[id number] DOB: 04-07-1941 DOA: 05/26/2013  PCP: [name], MD  Admit date: 05/26/2013 Discharge date: 05/28/2013  Time spent: 40 minutes  Recommendations for Outpatient Follow-up:  1. Needs OP cardiac re-assesment if continue to have CP-might be microangiopathic 2. Needs to f/u with Renal for ESRD 3. Needs OP discussion of anticoagulation  Discharge Diagnoses:  Principal Problem:   Chest pressure Active Problems:   Atherosclerotic peripheral vascular disease with gangrene   Complex partial seizures   ESRD (end stage renal disease) on dialysis   Hypertension   Hyperlipidemia   Paroxysmal atrial fibrillation   Mixed oligoastrocytoma   Diabetes mellitus without complication   GERD (gastroesophageal reflux disease)   Intermediate coronary syndrome   Refusal of blood transfusions as patient is Jehovah's Witness   Discharge Condition: fair  Diet recommendation: renal, heart healthy  Filed Weights   05/27/13 1502 05/27/13 1934 05/28/13 0557  Weight: 114.5 kg (252 lb 6.8 oz) 112.4 kg (247 lb 12.8 oz) 111.8 kg (246 lb 7.6 oz)    History of present illness:  72 y.o. BM PMHx obesity, HTN, HLD, DM Type 2, PVD, PAF, grade 1 diastolic heart failure, ESRD (due to HTN/ DM - related nephrotic syndrome 2011), oligoastrocytoma s/p resection in 2009, seizure disorder, who presented 05/26/13 with substernal chest pressure.  The patient was last at their baseline health a few weeks ago. The patient has had 3-4 episodes of 4 out of 10 substernal chest pressure with radiation to the left chest, left jaw, and left shoulder which come on at rest and lasts for 10-15 minutes. His first episode was 1-1.5 weeks ago and he has had an episode every few days. They resolve spontaneously. He has associated flushing, lightheadedness, blurry vision. He denies shortness of breath, however the EMS provider to pick him up from his clinic today noted that he  appeared dyspneic. He denies nausea but does have an indigestion feeling. Denies DOE, PND, orthopnea, palpitations. He attributed his pain to acid reflux and started taking a few tabs of Prilosec the last few days.  In the emergency department, labs were consistent with end-stage renal disease, initial troponin was negative, ECG demonstrate stated normal sinus rhythm with stable inverted T wave in aVL, stable 1 mm ST segment elevation in V1, and the 2. No other ST segment depressions or elevations or inverted T waves to suggest acute ischemia Noted neg Stress test 3 yrs prior [no records] Due to concenrs for ? Coronary equivalent as well as his bieng poor historian,  He underwent Cardiac cath with below result  Hemodynamic Findings:  Central aortic pressure: 113/58  Left ventricular pressure: 115/5/7  Angiographic Findings:  Left main: No obstructive disease.  Left Anterior Descending Artery: Large caliber vessel that courses to the apex. There is a large diagonal branch. No obstructive disease.  Circumflex Artery: Large caliber vessel with large obtuse marginal branch. No obstructive disease.  Right Coronary Artery: Large dominant vessel with no obstructive disease.  Left Ventricular Angiogram: LVEF=65%  Impression:  1. No angiographic evidence of CAD  2. Normal LV systolic function  3. Non-cardiac chest pain    Discharge Exam: Filed Vitals:   05/28/13 0947  BP: 111/59  Pulse: 75  Temp:   Resp:    Well, no issues, wants to go home Alert oriented in NAD General: above Cardiovascular:  s1 s 2no m/r/g Respiratory: clear  Discharge Instructions  Discharge Orders   Future Appointments Provider Department Dept Phone  08/04/2013 2:00 PM Suanne Marker, MD [facility] [phone]   10/06/2013 3:00 PM Suanne Marker, MD [facility] [phone]   Future Orders Complete By Expires   Diet - low sodium heart healthy  As directed     Discharge instructions  As directed    Comments:     You were cared for by a hospitalist during your hospital stay. If you have any questions about your discharge medications or the care you received while you were in the hospital after you are discharged, you can call the unit and asked to speak with the hospitalist on call if the hospitalist that took care of you is not available. Once you are discharged, your primary care physician will handle any further medical issues. Please note that NO REFILLS for any discharge medications will be authorized once you are discharged, as it is imperative that you return to your primary care physician (or establish a relationship with a primary care physician if you do not have one) for your aftercare needs so that they can reassess your need for medications and monitor your lab values. If you do not have a primary care physician, you can call [phone] for a physician referral.   Increase activity slowly  As directed        Medication List    STOP taking these medications       sildenafil 25 MG tablet  Commonly known as:  VIAGRA      TAKE these medications       aspirin EC 325 MG tablet  Take 325 mg by mouth daily.     atorvastatin 40 MG tablet  Commonly known as:  LIPITOR  Take 1 tablet (40 mg total) by mouth daily.     divalproex 500 MG 24 hr tablet  Commonly known as:  DEPAKOTE ER  Take 1,000 mg by mouth daily.     gabapentin 100 MG capsule  Commonly known as:  NEURONTIN  Take 1 capsule by mouth at bedtime.     metoprolol tartrate 12.5 mg Tabs tablet  Commonly known as:  LOPRESSOR  Take 0.5 tablets (12.5 mg total) by mouth 2 (two) times daily.     nitroGLYCERIN 0.4 MG SL tablet  Commonly known as:  NITROSTAT  Place 1 tablet (0.4 mg total) under the tongue every 5 (five) minutes x 3 doses as needed for chest pain.     omeprazole 20 MG tablet  Commonly known as:  PRILOSEC OTC  Take 20 mg by mouth daily as needed (for heartburn).      PRORENAL QD Caps  Take 1 capsule by mouth daily.     sevelamer carbonate 800 MG tablet  Commonly known as:  RENVELA  Take 3,200 mg by mouth 3 (three) times daily with meals.       No Known Allergies    The results of significant diagnostics from this hospitalization (including imaging, microbiology, ancillary and laboratory) are listed below for reference.    Significant Diagnostic Studies: Dg Chest 2 View  05/26/2013   *RADIOLOGY REPORT*  Clinical Data: Chest pain  CHEST - 2 VIEW  Comparison: February 15, 2013.  Findings: Cardiomediastinal silhouette appears normal.  No acute pulmonary disease is noted.  Bony thorax is intact.  IMPRESSION: No acute cardiopulmonary abnormality seen.   Original Report Authenticated By: Lupita Raider.,  M.D.    Microbiology: No results found for this or any previous visit (from the past 240 hour(s)).   Labs:  Basic Metabolic Panel:  Recent Labs Lab 05/26/13 1310 05/26/13 1730 05/27/13 0530  NA 136  --  137  K 3.8  --  3.6  CL 94*  --  96  CO2 26  --  25  GLUCOSE 161*  --  119*  BUN 30*  --  39*  CREATININE 6.85* 7.24* 8.04*  CALCIUM 9.7  --  9.5   Liver Function Tests:  Recent Labs Lab 05/26/13 1310  AST 13  ALT 11  ALKPHOS 77  BILITOT 0.3  PROT 6.8  ALBUMIN 3.4*   No results found for this basename: LIPASE, AMYLASE,  in the last 168 hours No results found for this basename: AMMONIA,  in the last 168 hours CBC:  Recent Labs Lab 05/26/13 1310 05/26/13 1730 05/27/13 0530  WBC 6.3 6.9 5.9  NEUTROABS 3.1  --   --   HGB 11.3* 11.7* 10.6*  HCT 32.9* 33.2* 30.8*  MCV 91.1 90.5 90.3  PLT 242 255 259   Cardiac Enzymes:  Recent Labs Lab 05/26/13 1700 05/26/13 2349 05/27/13 0530  TROPONINI <0.30 <0.30 <0.30   BNP: BNP (last 3 results)  Recent Labs  05/26/13 1700  PROBNP 699.3*   CBG:  Recent Labs Lab 05/27/13 0748 05/27/13 1306 05/27/13 2128 05/28/13 0733 05/28/13 1221  GLUCAP 130* 98 113* 114* 103*        Signed:  Sekai Nayak, JAI-GURMUKH  [facility] 05/28/2013, 1:29 PM

## 2013-06-14 ENCOUNTER — Other Ambulatory Visit (HOSPITAL_COMMUNITY): Payer: Self-pay | Admitting: Podiatry

## 2013-06-14 DIAGNOSIS — R0989 Other specified symptoms and signs involving the circulatory and respiratory systems: Secondary | ICD-10-CM

## 2013-06-15 ENCOUNTER — Encounter (HOSPITAL_COMMUNITY): Payer: Medicare Other

## 2013-06-19 ENCOUNTER — Encounter (HOSPITAL_COMMUNITY): Payer: Self-pay | Admitting: *Deleted

## 2013-06-19 ENCOUNTER — Emergency Department (HOSPITAL_COMMUNITY): Payer: Medicare Other

## 2013-06-19 ENCOUNTER — Inpatient Hospital Stay (HOSPITAL_COMMUNITY)
Admission: EM | Admit: 2013-06-19 | Discharge: 2013-06-23 | DRG: 091 | Disposition: A | Discharge status: Home / Self Care | Payer: Medicare Other | Attending: Internal Medicine | Admitting: Internal Medicine

## 2013-06-19 DIAGNOSIS — E785 Hyperlipidemia, unspecified: Secondary | ICD-10-CM | POA: Diagnosis present

## 2013-06-19 DIAGNOSIS — I120 Hypertensive chronic kidney disease with stage 5 chronic kidney disease or end stage renal disease: Secondary | ICD-10-CM | POA: Diagnosis present

## 2013-06-19 DIAGNOSIS — F3289 Other specified depressive episodes: Secondary | ICD-10-CM | POA: Diagnosis not present

## 2013-06-19 DIAGNOSIS — Z992 Dependence on renal dialysis: Secondary | ICD-10-CM

## 2013-06-19 DIAGNOSIS — I2510 Atherosclerotic heart disease of native coronary artery without angina pectoris: Secondary | ICD-10-CM | POA: Diagnosis present

## 2013-06-19 DIAGNOSIS — I4891 Unspecified atrial fibrillation: Secondary | ICD-10-CM | POA: Diagnosis present

## 2013-06-19 DIAGNOSIS — G40909 Epilepsy, unspecified, not intractable, without status epilepticus: Secondary | ICD-10-CM | POA: Diagnosis present

## 2013-06-19 DIAGNOSIS — I70269 Atherosclerosis of native arteries of extremities with gangrene, unspecified extremity: Secondary | ICD-10-CM | POA: Diagnosis present

## 2013-06-19 DIAGNOSIS — I639 Cerebral infarction, unspecified: Secondary | ICD-10-CM | POA: Diagnosis present

## 2013-06-19 DIAGNOSIS — Z7982 Long term (current) use of aspirin: Secondary | ICD-10-CM

## 2013-06-19 DIAGNOSIS — C712 Malignant neoplasm of temporal lobe: Secondary | ICD-10-CM

## 2013-06-19 DIAGNOSIS — I6350 Cerebral infarction due to unspecified occlusion or stenosis of unspecified cerebral artery: Secondary | ICD-10-CM

## 2013-06-19 DIAGNOSIS — R4781 Slurred speech: Secondary | ICD-10-CM

## 2013-06-19 DIAGNOSIS — N390 Urinary tract infection, site not specified: Secondary | ICD-10-CM | POA: Diagnosis present

## 2013-06-19 DIAGNOSIS — S88119A Complete traumatic amputation at level between knee and ankle, unspecified lower leg, initial encounter: Secondary | ICD-10-CM

## 2013-06-19 DIAGNOSIS — N186 End stage renal disease: Secondary | ICD-10-CM | POA: Diagnosis present

## 2013-06-19 DIAGNOSIS — Z79899 Other long term (current) drug therapy: Secondary | ICD-10-CM

## 2013-06-19 DIAGNOSIS — F329 Major depressive disorder, single episode, unspecified: Secondary | ICD-10-CM | POA: Diagnosis not present

## 2013-06-19 DIAGNOSIS — M129 Arthropathy, unspecified: Secondary | ICD-10-CM | POA: Diagnosis present

## 2013-06-19 DIAGNOSIS — B952 Enterococcus as the cause of diseases classified elsewhere: Secondary | ICD-10-CM | POA: Diagnosis present

## 2013-06-19 DIAGNOSIS — I10 Essential (primary) hypertension: Secondary | ICD-10-CM

## 2013-06-19 DIAGNOSIS — E119 Type 2 diabetes mellitus without complications: Secondary | ICD-10-CM | POA: Diagnosis present

## 2013-06-19 DIAGNOSIS — D649 Anemia, unspecified: Secondary | ICD-10-CM | POA: Diagnosis present

## 2013-06-19 DIAGNOSIS — G40209 Localization-related (focal) (partial) symptomatic epilepsy and epileptic syndromes with complex partial seizures, not intractable, without status epilepticus: Secondary | ICD-10-CM

## 2013-06-19 DIAGNOSIS — I200 Unstable angina: Secondary | ICD-10-CM

## 2013-06-19 DIAGNOSIS — Z531 Procedure and treatment not carried out because of patient's decision for reasons of belief and group pressure: Secondary | ICD-10-CM

## 2013-06-19 DIAGNOSIS — R0789 Other chest pain: Secondary | ICD-10-CM

## 2013-06-19 DIAGNOSIS — R4789 Other speech disturbances: Principal | ICD-10-CM | POA: Diagnosis present

## 2013-06-19 DIAGNOSIS — I480 Paroxysmal atrial fibrillation: Secondary | ICD-10-CM

## 2013-06-19 DIAGNOSIS — R6883 Chills (without fever): Secondary | ICD-10-CM

## 2013-06-19 DIAGNOSIS — K219 Gastro-esophageal reflux disease without esophagitis: Secondary | ICD-10-CM | POA: Diagnosis present

## 2013-06-19 DIAGNOSIS — C719 Malignant neoplasm of brain, unspecified: Secondary | ICD-10-CM

## 2013-06-19 DIAGNOSIS — N2581 Secondary hyperparathyroidism of renal origin: Secondary | ICD-10-CM | POA: Diagnosis present

## 2013-06-19 DIAGNOSIS — I70209 Unspecified atherosclerosis of native arteries of extremities, unspecified extremity: Secondary | ICD-10-CM | POA: Diagnosis present

## 2013-06-19 LAB — LACTIC ACID, PLASMA: Lactic Acid, Venous: 2 mmol/L (ref 0.5–2.2)

## 2013-06-19 LAB — BASIC METABOLIC PANEL
Calcium: 9.8 mg/dL (ref 8.4–10.5)
GFR calc Af Amer: 5 mL/min — ABNORMAL LOW (ref >90)
GFR calc non Af Amer: 4 mL/min — ABNORMAL LOW (ref >90)
Potassium: 4.6 mEq/L (ref 3.5–5.1)
Sodium: 135 mEq/L (ref 135–145)

## 2013-06-19 LAB — CBC
Hemoglobin: 12.4 g/dL — ABNORMAL LOW (ref 13.0–17.0)
MCH: 32 pg (ref 26.0–34.0)
MCHC: 35 g/dL (ref 30.0–36.0)
Platelets: 284 10*3/uL (ref 150–400)

## 2013-06-19 LAB — GLUCOSE, CAPILLARY: Glucose-Capillary: 231 mg/dL — ABNORMAL HIGH (ref 70–99)

## 2013-06-19 MED ORDER — OMEPRAZOLE MAGNESIUM 20 MG PO TBEC: 20.0000 mg | DELAYED_RELEASE_TABLET | Freq: Every day | ORAL | Status: DC | PRN

## 2013-06-19 MED ORDER — CLOPIDOGREL BISULFATE 75 MG PO TABS
75.0000 mg | ORAL_TABLET | Freq: Every day | ORAL | Status: DC
  Administered 2013-06-20: 75 mg via ORAL
  Filled 2013-06-19: qty 1

## 2013-06-19 MED ORDER — SEVELAMER CARBONATE 800 MG PO TABS
3200.0000 mg | ORAL_TABLET | Freq: Three times a day (TID) | ORAL | Status: DC
  Administered 2013-06-20 – 2013-06-23 (×9): 3200 mg via ORAL
  Filled 2013-06-19 (×13): qty 4

## 2013-06-19 MED ORDER — METOPROLOL TARTRATE 12.5 MG HALF TABLET
12.5000 mg | ORAL_TABLET | Freq: Two times a day (BID) | ORAL | Status: DC
  Administered 2013-06-19 – 2013-06-22 (×7): 12.5 mg via ORAL
  Filled 2013-06-19 (×9): qty 1

## 2013-06-19 MED ORDER — GABAPENTIN 100 MG PO CAPS
100.0000 mg | ORAL_CAPSULE | Freq: Every day | ORAL | Status: DC
  Administered 2013-06-19 – 2013-06-22 (×4): 100 mg via ORAL
  Filled 2013-06-19 (×5): qty 1

## 2013-06-19 MED ORDER — ATORVASTATIN CALCIUM 40 MG PO TABS
40.0000 mg | ORAL_TABLET | Freq: Every day | ORAL | Status: DC
  Administered 2013-06-19 – 2013-06-23 (×5): 40 mg via ORAL
  Filled 2013-06-19 (×5): qty 1

## 2013-06-19 MED ORDER — HEPARIN SODIUM (PORCINE) 5000 UNIT/ML IJ SOLN
5000.0000 [IU] | Freq: Three times a day (TID) | INTRAMUSCULAR | Status: DC
  Administered 2013-06-19 – 2013-06-23 (×11): 5000 [IU] via SUBCUTANEOUS
  Filled 2013-06-19 (×14): qty 1

## 2013-06-19 MED ORDER — DIVALPROEX SODIUM ER 500 MG PO TB24
1000.0000 mg | ORAL_TABLET | Freq: Every day | ORAL | Status: DC
  Administered 2013-06-19 – 2013-06-20 (×2): 1000 mg via ORAL
  Filled 2013-06-19 (×2): qty 2

## 2013-06-19 MED ORDER — ASPIRIN EC 325 MG PO TBEC
325.0000 mg | DELAYED_RELEASE_TABLET | Freq: Every day | ORAL | Status: DC
Start: 2013-06-20 — End: 2013-06-23
  Administered 2013-06-20 – 2013-06-23 (×4): 325 mg via ORAL
  Filled 2013-06-19 (×5): qty 1

## 2013-06-19 MED ORDER — PANTOPRAZOLE SODIUM 40 MG PO TBEC: 40.0000 mg | DELAYED_RELEASE_TABLET | Freq: Every day | ORAL | Status: DC | PRN

## 2013-06-19 NOTE — ED Notes (Signed)
Patient transported to CT 

## 2013-06-19 NOTE — ED Notes (Signed)
Pt is here with slurred speech and shaking that started at 1645 while at home.  Pt has no facial droop, alert, and wife reports that speech is improved, no drifts.

## 2013-06-19 NOTE — ED Provider Notes (Signed)
CSN: [id number]     Arrival date & time 06/19/13  1659 History   First MD Initiated Contact with Patient 06/19/13 1707     Chief Complaint  Patient presents with  . Slurred Speech   . Shaking   (Consider location/radiation/quality/duration/timing/severity/associated sxs/prior Treatment) HPI Comments: Wife stated some shaking episodes today. During one of the episodes, he had slurred speech that lasted about 10-15 minutes. He continued to have intermittent shaking episodes without any speech difficulties.  Patient is a 72 y.o. male presenting with general illness.  Illness Location:  Home Quality:  10 minute rigor that started while resting Severity:  Mild Onset quality:  Gradual Duration: 15 minutes. Timing:  Sporadic Progression:  Worsening Chronicity:  Recurrent Context:  Shaking episodes lately at home. Has these at dialysis also Relieved by:  Nothing Worsened by:  Nothing Ineffective treatments:  None Associated symptoms: no abdominal pain, no chest pain, no cough, no fever, no nausea, no rash, no shortness of breath and no vomiting   Risk factors:  ESRD, PVD, HTN, HLD   Past Medical History  Diagnosis Date  . Dialysis patient   . Peripheral vascular disease   . ESRD (end stage renal disease) on dialysis 09/2010    MWF  . Hypertension   . Hyperlipidemia   . Arthritis     Gout  . Paroxysmal atrial fibrillation   . Brain tumor 2009  . Seizure disorder 2009  . Anemia   . Hyperparathyroidism, secondary   . Diabetes mellitus without complication     Type II, diet controlled  . Proteus mirabilis infection   . CHF (congestive heart failure)     grade 1 DD, preserved EF, [facility] records  . CAD (coronary artery disease)     reports that last stress test was 1-2 years ago at [facility] and was negative, but have not found these records.    . 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 brain tumor  . GERD (gastroesophageal reflux  disease)    Past Surgical History  Procedure Laterality Date  . Av fistula placement Left     arm  . 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 Eye surgery Bilateral 2000    cataract  . Retinopathy surgery      Hx. of laser treatments for diabetics   Family History  Problem Relation Age of Onset  . Seizures Mother   . Stroke Mother 45  . Hypertension Mother   . Alcohol abuse Father   . Hypertension Father   . Arthritis Sister   . Heart attack Neg Hx   . Heart failure Neg Hx    History  Substance Use Topics  . Smoking status: Never Smoker   . Smokeless tobacco: Never Used  . Alcohol Use: 0.6 oz/week    1 Shots of liquor per week     Comment: 2-4 beers daily      Review of Systems  Constitutional: Negative for fever and chills.  Respiratory: Negative for cough and shortness of breath.   Cardiovascular: Negative for chest pain and leg swelling.  Gastrointestinal: Negative for nausea, vomiting and abdominal pain.  Musculoskeletal: Negative for arthralgias.  Skin: Negative for rash.  All other systems reviewed and are negative.    Allergies  Review of patient's allergies indicates no known allergies.  Home Medications   Current Outpatient Rx  Name  Route  Sig  Dispense  Refill  . aspirin EC 325 MG tablet   Oral   Take 325 mg by mouth daily.         Marland Kitchen atorvastatin (LIPITOR) 40 MG tablet   Oral   Take 1 tablet (40 mg total) by mouth daily.   30 tablet   0   . divalproex (DEPAKOTE ER) 500 MG 24 hr tablet   Oral   Take 1,000 mg by mouth daily.         Marland Kitchen gabapentin (NEURONTIN) 100 MG capsule   Oral   Take 1 capsule by mouth at bedtime.         . metoprolol tartrate (LOPRESSOR) 12.5 mg TABS tablet   Oral   Take 0.5 tablets (12.5 mg total) by mouth 2 (two) times daily.   60 tablet   0     Skip a dose if you feel dizzy and discuss with you ...   . Multiple Vitamins-Minerals (PRORENAL QD) CAPS    Oral   Take 1 capsule by mouth daily.         . nitroGLYCERIN (NITROSTAT) 0.4 MG SL tablet   Sublingual   Place 1 tablet (0.4 mg total) under the tongue every 5 (five) minutes x 3 doses as needed for chest pain.   30 tablet   12   . omeprazole (PRILOSEC OTC) 20 MG tablet   Oral   Take 20 mg by mouth daily as needed (for heartburn).         . sevelamer carbonate (RENVELA) 800 MG tablet   Oral   Take 3,200 mg by mouth 3 (three) times daily with meals.          BP 156/76  Pulse 72  Temp(Src) 97.9 F (36.6 C) (Oral)  Resp 20  Wt 252 lb 3.3 oz (114.4 kg)  BMI 30.71 kg/m2  SpO2 99% Physical Exam  Nursing note and vitals reviewed. Constitutional: He is oriented to person, place, and time. He appears well-developed and well-nourished. No distress.  Has occasional tremors/rigors that last about 10 seconds  HENT:  Head: Normocephalic and atraumatic.  Mouth/Throat: No oropharyngeal exudate.  Eyes: EOM are normal. Pupils are equal, round, and reactive to light.  Neck: Normal range of motion. Neck supple.  Cardiovascular: Normal rate and regular rhythm.  Exam reveals no friction rub.   No murmur heard. Pulmonary/Chest: Effort normal and breath sounds normal. No respiratory distress. He has no wheezes. He has no rales.  Abdominal: He exhibits no distension. There is no tenderness. There is no rebound.  Musculoskeletal: Normal range of motion. He exhibits no edema.  L BKA  Neurological: He is alert and oriented to person, place, and time. No cranial nerve deficit. He exhibits normal muscle tone. Coordination normal.  Skin: No rash noted. He is not diaphoretic.    ED Course  Procedures (including critical care time) Labs Review Labs Reviewed  GLUCOSE, CAPILLARY - Abnormal; Notable for the following:    Glucose-Capillary 231 (*)    All other components within normal limits  CBC  BASIC METABOLIC PANEL  LACTIC ACID, PLASMA   Imaging Review Dg Chest 2 View  06/19/2013    CLINICAL DATA:  Hypertension, tremors  EXAM: CHEST  2 VIEW  COMPARISON:  05/26/2013  FINDINGS: The cardiac shadow is stable. The lungs are well aerated bilaterally. The lungs are well aerated. Some mild elevation of the right hemidiaphragm is again seen. Mild right basilar atelectasis is noted. No sizable effusion is  seen.  IMPRESSION: Right basilar atelectasis.   Electronically Signed   By: Alcide Clever M.D.   On: 06/19/2013 17:45   Ct Head Wo Contrast  06/19/2013   *RADIOLOGY REPORT*  Clinical Data: Slurred speech, shaking  CT HEAD WITHOUT CONTRAST  Technique:  Contiguous axial images were obtained from the base of the skull through the vertex without contrast.  Comparison: None.  Findings: No acute intracranial hemorrhage, mass lesion, mass effect, midline shift or hydrocephalus.  Gray-white differentiation is preserved throughout.  Rounded area of relative hypoattenuation in the superior aspect of the posterior limb left internal capsule extending cephalad into the corona radiata may represent an area of acute ischemia.  Extensive encephalomalacia in the inferior right temporal lobe combined with the surgical changes of right temporal craniotomy suggest prior neurosurgical intervention. Encephalomalacia extends into the right cerebellum. Periventricular white matter hypoattenuation is suggestive of underlying chronic microvascular ischemia.  No acute soft tissue or calvarial abnormality.  Intracranial atherosclerosis in the bilateral cavernous carotid arteries. Normal aeration of the mastoid air cells and paranasal sinuses.  IMPRESSION:  1.  Rounded focus of low attenuation in the superior aspect of the posterior limb of the left internal capsule extending toward the corona radiata may represent an area of acute / subacute ischemia. MRI could further evaluate if clinically warranted.  2.  Surgical changes of prior right temporal craniotomy with extensive encephalomalacia in the right temporal lobe and  inferior right cerebellum.  Query history of prior tumor resection or intracranial hematoma evacuation.  3.  Intracranial atherosclerosis and the sequela of chronic microvascular white matter disease.   Original Report Authenticated By: Malachy Moan, M.D.     Date: 06/19/2013  Rate: 75  Rhythm: normal sinus rhythm  QRS Axis: normal  Intervals: normal  ST/T Wave abnormalities: nonspecific ST & T wave abnormalities inferiorly, simlar to prior Conduction Disutrbances:none  Narrative Interpretation:   Old EKG Reviewed: unchanged   MDM   1. Acute CVA (cerebrovascular accident)   2. Complex partial seizures   3. Diabetes mellitus without complication   4. Refusal of blood transfusions as patient is Jehovah's Witness   5. Seizure disorder   6. Chills (without fever)   7. Hypertension    72 year old male with history of end-stage renal disease on dialysis, peripheral vascular disease, hypertension, hyperlipidemia presents with shaking episodes and slurred speech. Patient's wife states he had multiple shaking episodes in the past several weeks. They come on out of the blue and will last 10-15 minutes. She states that he has slowed speech during these episodes. I asked if his slurred speech was secondary to her shaking, she felt like no speech was not correct. He continued to have normal speech after that with his shaking episode that occurred after that. His last attempt to 15 minutes. He does not have any history of stroke. He denies any recent fever, shortness of breath, chest pain, nausea vomiting. 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 Patient with stable vitals. He has a few shaking episodes in the room. He does not have any slurred speech with them here. He has normal cranial nerves, normal arm strength and sensation, normal leg strength and sensation. I  do not feel patient has any signs of acute stroke at this time. His slurred speech, and concern for possible TIA. Also check  other basic labs. CT shows possible acute ischemia. On repeat exam, no deficits, normal strength and sensation in all extremities. Neuro consulted and will see patient. Medicine admitting patient.    Dagmar Hait, MD 06/19/13 2350

## 2013-06-19 NOTE — ED Notes (Signed)
EDP at bedside  

## 2013-06-19 NOTE — Consult Note (Signed)
Referring Physician: Dr. Gwendolyn Grant    Chief Complaint: Transient slurred speech.  HPI: [name] [name] is an 72 y.o. male history of end-stage renal disease on dialysis, atrial fibrillation, hypertension, diabetes mellitus and hyperlipidemia was brought to the emergency room for evaluation of possible TIA or stroke. Patient has had intermittent episodes of shaking of his extremities today. During one of those episodes he had a 15 minute period of slurred speech which was noticed by his wife. Symptoms lasted about 15 minutes then resolved with no recurrence, even though he's had recurrent episodes of shaking of his extremities. Patient has been afebrile. CT scan of his head showed an area of encephalomalacia involving right temporal region, as well as an area of hypodensity in the left internal capsule region indicative of acute/subacute infarct. Patient has a history of tumor resection. He also has a history of seizure disorder and is currently on Depakote. He also takes Neurontin but only takes 100 mg at bedtime only. NIH stroke score was 0. Patient is not on anticoagulation and has been taking aspirin 325 mg per day.  LSN: [id number] on 06/19/2013 tPA Given: No: Deficits resolved MRankin: 0  Past Medical History  Diagnosis Date  . Dialysis patient   . Peripheral vascular disease   . ESRD (end stage renal disease) on dialysis 09/2010    MWF  . Hypertension   . Hyperlipidemia   . Arthritis     Gout  . Paroxysmal atrial fibrillation   . Brain tumor 2009  . Seizure disorder 2009  . Anemia   . Hyperparathyroidism, secondary   . Diabetes mellitus without complication     Type II, diet controlled  . Proteus mirabilis infection   . CHF (congestive heart failure)     grade 1 DD, preserved EF, [facility] records  . CAD (coronary artery disease)     reports that last stress test was 1-2 years ago at [facility] and was negative, but have not found these records.    . Mixed oligoastrocytoma 2009    with  resection.  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 brain tumor  . GERD (gastroesophageal reflux disease)     Family History  Problem Relation Age of Onset  . Seizures Mother   . Stroke Mother 40  . Hypertension Mother   . Alcohol abuse Father   . Hypertension Father   . Arthritis Sister   . Heart attack Neg Hx   . Heart failure Neg Hx      Medications: I have reviewed the patient's current medications.  ROS: History obtained from spouse and the patient  General ROS: negative for - chills, fatigue, fever, night sweats, weight gain or weight loss Psychological ROS: negative for - behavioral disorder, hallucinations, memory difficulties, mood swings or suicidal ideation Ophthalmic ROS: negative for - blurry vision, double vision, eye pain or loss of vision ENT ROS: negative for - epistaxis, nasal discharge, oral lesions, sore throat, tinnitus or vertigo Allergy and Immunology ROS: negative for - hives or itchy/watery eyes Hematological and Lymphatic ROS: negative for - bleeding problems, bruising or swollen lymph nodes Endocrine ROS: negative for - galactorrhea, hair pattern changes, polydipsia/polyuria or temperature intolerance Respiratory ROS: negative for - cough, hemoptysis, shortness of breath or wheezing Cardiovascular ROS: negative for - chest pain, dyspnea on exertion, edema or irregular heartbeat Gastrointestinal ROS: negative for - abdominal pain, diarrhea, hematemesis, nausea/vomiting or stool incontinence Genito-Urinary ROS: negative for - dysuria, hematuria, incontinence or urinary frequency/urgency Musculoskeletal ROS: negative for -  joint swelling or muscular weakness Neurological ROS: as noted in HPI Dermatological ROS: negative for rash and skin lesion changes  Physical Examination: Blood pressure 155/78, pulse 72, temperature 98.5 F (36.9 C), temperature source Oral, resp. rate 20, weight 114.4 kg (252 lb 3.3 oz), SpO2 99.00%.  Neurologic  Examination: Mental Status: Alert, oriented, thought content appropriate.  Speech fluent without evidence of aphasia. Able to follow commands without difficulty. Cranial Nerves: II-Visual fields were normal. III/IV/VI-Pupils were equal and reacted. Extraocular movements were full and conjugate.    V/VII-no facial numbness and no facial weakness. VIII-normal. X-normal speech and symmetrical palatal movement. Motor: 5/5 bilaterally with normal tone and bulk; left BK amputation noted Sensory: Reduced perception to touch and vibration below the right knee. Deep Tendon Reflexes: Trace to 1+ and symmetric. Plantars: Flexor on the right Cerebellar: Normal finger-to-nose testing except for slight intention tremor bilaterally. Carotid auscultation: Normal  Dg Chest 2 View  06/19/2013   CLINICAL DATA:  Hypertension, tremors  EXAM: CHEST  2 VIEW  COMPARISON:  05/26/2013  FINDINGS: The cardiac shadow is stable. The lungs are well aerated bilaterally. The lungs are well aerated. Some mild elevation of the right hemidiaphragm is again seen. Mild right basilar atelectasis is noted. No sizable effusion is seen.  IMPRESSION: Right basilar atelectasis.   Electronically Signed   By: Alcide Clever M.D.   On: 06/19/2013 17:45   Ct Head Wo Contrast  06/19/2013   *RADIOLOGY REPORT*  Clinical Data: Slurred speech, shaking  CT HEAD WITHOUT CONTRAST  Technique:  Contiguous axial images were obtained from the base of the skull through the vertex without contrast.  Comparison: None.  Findings: No acute intracranial hemorrhage, mass lesion, mass effect, midline shift or hydrocephalus.  Gray-white differentiation is preserved throughout.  Rounded area of relative hypoattenuation in the superior aspect of the posterior limb left internal capsule extending cephalad into the corona radiata may represent an area of acute ischemia.  Extensive encephalomalacia in the inferior right temporal lobe combined with the surgical changes of  right temporal craniotomy suggest prior neurosurgical intervention. Encephalomalacia extends into the right cerebellum. Periventricular white matter hypoattenuation is suggestive of underlying chronic microvascular ischemia.  No acute soft tissue or calvarial abnormality.  Intracranial atherosclerosis in the bilateral cavernous carotid arteries. Normal aeration of the mastoid air cells and paranasal sinuses.  IMPRESSION:  1.  Rounded focus of low attenuation in the superior aspect of the posterior limb of the left internal capsule extending toward the corona radiata may represent an area of acute / subacute ischemia. MRI could further evaluate if clinically warranted.  2.  Surgical changes of prior right temporal craniotomy with extensive encephalomalacia in the right temporal lobe and inferior right cerebellum.  Query history of prior tumor resection or intracranial hematoma evacuation.  3.  Intracranial atherosclerosis and the sequela of chronic microvascular white matter disease.   Original Report Authenticated By: Malachy Moan, M.D.    Assessment: 72 y.o. male presenting with TIA or possibly an acute small left internal capsular ischemic infarction.  Stroke Risk Factors - atrial fibrillation, diabetes mellitus, family history, hyperlipidemia and hypertension  Plan: 1. HgbA1c, fasting lipid panel 2. MRI, MRA  of the brain without contrast 3. PT consult, OT consult, Speech consult 4. Echocardiogram 5. Carotid dopplers 6. Prophylactic therapy-Antiplatelet med: Plavix 75 mg per day 7. Risk factor modification 8. Consider increasing Neurontin to 100 mg morning and night for management of symptoms consistent with left occipital neuralgia.    C.R. Roseanne Reno,  MD [facility] Pager [phone]  06/19/2013, 8:43 PM

## 2013-06-19 NOTE — H&P (Signed)
[facility] History and Physical  Dhruv Christina WGN:[id number] DOB: [birth year]    PCP:   [name], MD   Chief Complaint: chills.  HPI: [name] [name] is an 72 y.o. male with history of end-stage renal disease on dialysis (T Ths SAT), atrial fibrillation not on anticoagulation) , hypertension, diabetes mellitus and hyperlipidemia, seizure, brain tumor s/p resection, leg gangrene s/p AKA, presents to the ER mainly because of chills. He was at the office working and experience severe chills, with rigors.  He didn't lose consciousness.  He denied feeling any subjective fever, coughs, HA, abdominal pain, dysuria, rash, sorethroat, earaches or myalgia. On closer questioning, his wife also stated that he was having a 15 minutes of slurred speech without focal motor weakenss, facial droop, or AMS.  Evaluation in the ER included an EKG showing NSR, a head CT showing osteomalacia in the temporal lobe with evidence of prior surgery, and a acute/sub acute left internal capsule.  Labs showed Cr of 10 and K of 4.6 with bicarb of 27.  Hospitalist was asked to admit him for TIA/CVA.  Neurology was consutled by EDP as well.   Rewiew of Systems:  Constitutional: Negative for malaise, fever No significant weight loss or weight gain Eyes: Negative for eye pain, redness and discharge, diplopia, visual changes, or flashes of light. ENMT: Negative for ear pain, hoarseness, nasal congestion, sinus pressure and sore throat. No headaches; tinnitus, drooling, or problem swallowing. Cardiovascular: Negative for chest pain, palpitations, diaphoresis, dyspnea and peripheral edema. ; No orthopnea, PND Respiratory: Negative for cough, hemoptysis, wheezing and stridor. No pleuritic chestpain. Gastrointestinal: Negative for nausea, vomiting, diarrhea, constipation, abdominal pain, melena, blood in stool, hematemesis, jaundice and rectal bleeding.    Genitourinary: Negative for frequency, dysuria,  incontinence,flank pain and hematuria; Musculoskeletal: Negative for back pain and neck pain. Negative for swelling and trauma.;  Skin: . Negative for pruritus, rash, abrasions, bruising and skin lesion.; ulcerations Neuro: Negative for headache, lightheadedness and neck stiffness. Negative for weakness, altered level of consciousness , altered mental status, extremity weakness, burning feet, seizure and syncope.  Psych: negative for anxiety, depression, insomnia, tearfulness, panic attacks, hallucinations, paranoia, suicidal or homicidal ideation.   Past Medical History  Diagnosis Date  . Dialysis patient   . Peripheral vascular disease   . ESRD (end stage renal disease) on dialysis 09/2010    MWF  . Hypertension   . Hyperlipidemia   . Arthritis     Gout  . Paroxysmal atrial fibrillation   . Brain tumor 2009  . Seizure disorder 2009  . Anemia   . Hyperparathyroidism, secondary   . Diabetes mellitus without complication     Type II, diet controlled  . Proteus mirabilis infection   . CHF (congestive heart failure)     grade 1 DD, preserved EF, [facility] records  . CAD (coronary artery disease)     reports that last stress test was 1-2 years ago at [facility] and was negative, but have not found these records.    . 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 brain tumor  . GERD (gastroesophageal reflux disease)     Past Surgical History  Procedure Laterality Date  . Av fistula placement Left     arm  . 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 Eye surgery Bilateral 2000    cataract  . Retinopathy surgery  Hx. of laser treatments for diabetics    Medications:  HOME MEDS: Prior to Admission medications   Medication Sig Start Date End Date Taking? Authorizing Provider  aspirin EC 325 MG tablet Take 325 mg by mouth daily.   Yes Historical Provider, MD  atorvastatin (LIPITOR) 40  MG tablet Take 1 tablet (40 mg total) by mouth daily. 05/28/13  Yes Rhetta Mura, MD  divalproex (DEPAKOTE ER) 500 MG 24 hr tablet Take 1,000 mg by mouth daily.   Yes Historical Provider, MD  gabapentin (NEURONTIN) 100 MG capsule Take 1 capsule by mouth at bedtime. 01/21/13  Yes Historical Provider, MD  metoprolol tartrate (LOPRESSOR) 12.5 mg TABS tablet Take 0.5 tablets (12.5 mg total) by mouth 2 (two) times daily. 05/28/13  Yes Rhetta Mura, MD  Multiple Vitamins-Minerals (PRORENAL QD) CAPS Take 1 capsule by mouth daily.   Yes Historical Provider, MD  nitroGLYCERIN (NITROSTAT) 0.4 MG SL tablet Place 1 tablet (0.4 mg total) under the tongue every 5 (five) minutes x 3 doses as needed for chest pain. 05/28/13  Yes Rhetta Mura, MD  omeprazole (PRILOSEC OTC) 20 MG tablet Take 20 mg by mouth daily as needed (for heartburn).   Yes Historical Provider, MD  sevelamer carbonate (RENVELA) 800 MG tablet Take 3,200 mg by mouth 3 (three) times daily with meals.   Yes Historical Provider, MD     Allergies:  No Known Allergies  Social History:   reports that he has never smoked. He has never used smokeless tobacco. He reports that he drinks about 0.6 ounces of alcohol per week. He reports that he does not use illicit drugs.  Family History: Family History  Problem Relation Age of Onset  . Seizures Mother   . Stroke Mother 62  . Hypertension Mother   . Alcohol abuse Father   . Hypertension Father   . Arthritis Sister   . Heart attack Neg Hx   . Heart failure Neg Hx      Physical Exam: Filed Vitals:   06/19/13 1705 06/19/13 1930  BP: 156/76 155/78  Pulse: 72 72  Temp: 97.9 F (36.6 C)   TempSrc: Oral   Resp: 20   Weight: 114.4 kg (252 lb 3.3 oz)   SpO2: 99% 99%   Blood pressure 155/78, pulse 72, temperature 97.9 F (36.6 C), temperature source Oral, resp. rate 20, weight 114.4 kg (252 lb 3.3 oz), SpO2 99.00%.  GEN:  Pleasant patient lying in the stretcher in no acute  distress; cooperative with exam. PSYCH:  alert and oriented x4; does not appear anxious or depressed; affect is appropriate. HEENT: Mucous membranes pink and anicteric; PERRLA; EOM intact; no cervical lymphadenopathy nor thyromegaly or carotid bruit; no JVD; There were no stridor. Neck is very supple. Breasts:: Not examined CHEST WALL: No tenderness CHEST: Normal respiration, clear to auscultation bilaterally.  HEART: Regular rate and rhythm.  There are no murmur, rub, or gallops.   BACK: No kyphosis or scoliosis; no CVA tenderness ABDOMEN: soft and non-tender; no masses, no organomegaly, normal abdominal bowel sounds; no pannus; no intertriginous candida. There is no rebound and no distention. Rectal Exam: Not done EXTREMITIES: No bone or joint deformity; age-appropriate arthropathy of the hands and knees; no edema; no ulcerations.  There is no calf tenderness. S/p left leg AKA.  AVF with thrills. Genitalia: not examined PULSES: 2+ and symmetric SKIN: Normal hydration no rash or ulceration CNS: Cranial nerves 2-12 grossly intact no focal lateralizing neurologic deficit.  Speech is fluent; uvula elevated  with phonation, facial symmetry and tongue midline. cerebella exam is intact, barbinski is negative and strengths are equaled bilaterally.  No sensory loss.   Labs on Admission:  Basic Metabolic Panel:  Recent Labs Lab 06/19/13 1807  NA 135  K 4.6  CL 88*  CO2 27  GLUCOSE 228*  BUN 59*  CREATININE 10.33*  CALCIUM 9.8   Liver Function Tests: No results found for this basename: AST, ALT, ALKPHOS, BILITOT, PROT, ALBUMIN,  in the last 168 hours No results found for this basename: LIPASE, AMYLASE,  in the last 168 hours No results found for this basename: AMMONIA,  in the last 168 hours CBC:  Recent Labs Lab 06/19/13 1807  WBC 9.9  HGB 12.4*  HCT 35.4*  MCV 91.2  PLT 284   Cardiac Enzymes: No results found for this basename: CKTOTAL, CKMB, CKMBINDEX, TROPONINI,  in the last  168 hours  CBG:  Recent Labs Lab 06/19/13 1702  GLUCAP 231*     Radiological Exams on Admission: Dg Chest 2 View  06/19/2013   CLINICAL DATA:  Hypertension, tremors  EXAM: CHEST  2 VIEW  COMPARISON:  05/26/2013  FINDINGS: The cardiac shadow is stable. The lungs are well aerated bilaterally. The lungs are well aerated. Some mild elevation of the right hemidiaphragm is again seen. Mild right basilar atelectasis is noted. No sizable effusion is seen.  IMPRESSION: Right basilar atelectasis.   Electronically Signed   By: Alcide Clever M.D.   On: 06/19/2013 17:45   Ct Head Wo Contrast  06/19/2013   *RADIOLOGY REPORT*  Clinical Data: Slurred speech, shaking  CT HEAD WITHOUT CONTRAST  Technique:  Contiguous axial images were obtained from the base of the skull through the vertex without contrast.  Comparison: None.  Findings: No acute intracranial hemorrhage, mass lesion, mass effect, midline shift or hydrocephalus.  Gray-white differentiation is preserved throughout.  Rounded area of relative hypoattenuation in the superior aspect of the posterior limb left internal capsule extending cephalad into the corona radiata may represent an area of acute ischemia.  Extensive encephalomalacia in the inferior right temporal lobe combined with the surgical changes of right temporal craniotomy suggest prior neurosurgical intervention. Encephalomalacia extends into the right cerebellum. Periventricular white matter hypoattenuation is suggestive of underlying chronic microvascular ischemia.  No acute soft tissue or calvarial abnormality.  Intracranial atherosclerosis in the bilateral cavernous carotid arteries. Normal aeration of the mastoid air cells and paranasal sinuses.  IMPRESSION:  1.  Rounded focus of low attenuation in the superior aspect of the posterior limb of the left internal capsule extending toward the corona radiata may represent an area of acute / subacute ischemia. MRI could further evaluate if  clinically warranted.  2.  Surgical changes of prior right temporal craniotomy with extensive encephalomalacia in the right temporal lobe and inferior right cerebellum.  Query history of prior tumor resection or intracranial hematoma evacuation.  3.  Intracranial atherosclerosis and the sequela of chronic microvascular white matter disease.   Original Report Authenticated By: Malachy Moan, M.D.    EKG: Independently reviewed. NSR.   Assessment/Plan Present on Admission:  . Acute CVA (cerebrovascular accident) . ESRD (end stage renal disease) on dialysis . Paroxysmal atrial fibrillation . Seizure disorder . Atherosclerotic peripheral vascular disease with gangrene . Chills (without fever)  PLAN:  Since he had episodes of chills, but without fever, clinical symptoms of infection, nor leukocytosis, will check UA with reflex, and blood culture.  Will not start any antibiotics.  For his slurred  speech, with CT showing subacute/acute CVA, will admit him for further stroke work up to include MRI/MRA, echo, and carotid.  He has hx of seizure, so will obtain EEG as well, and check his Valproic acid level.  Dr Roseanne Reno had recommended plavix, so I will add Plavix to his ASA, especially for his hx of afib, but not a great candidate for full anticoagulation (Afib guidelines to anticoagulation).  For his ESRD, he will need dialysis next Tuesday.  He is stable, full code, and will be admitted to [facility] service.  Thank you for allowing me to participate in his care.  Other plans as per orders.  Code Status: FULL Unk Lightning, MD. [facility] Pager [phone] 7pm to 7am.  06/19/2013, 8:32 PM

## 2013-06-19 NOTE — ED Notes (Signed)
Dr Stewart at bedside.

## 2013-06-19 NOTE — ED Notes (Signed)
Dr Le at bedside.  

## 2013-06-20 ENCOUNTER — Inpatient Hospital Stay (HOSPITAL_COMMUNITY): Payer: Medicare Other

## 2013-06-20 DIAGNOSIS — I517 Cardiomegaly: Secondary | ICD-10-CM

## 2013-06-20 DIAGNOSIS — I70269 Atherosclerosis of native arteries of extremities with gangrene, unspecified extremity: Secondary | ICD-10-CM

## 2013-06-20 DIAGNOSIS — N186 End stage renal disease: Secondary | ICD-10-CM

## 2013-06-20 DIAGNOSIS — I4891 Unspecified atrial fibrillation: Secondary | ICD-10-CM

## 2013-06-20 DIAGNOSIS — E785 Hyperlipidemia, unspecified: Secondary | ICD-10-CM

## 2013-06-20 DIAGNOSIS — I10 Essential (primary) hypertension: Secondary | ICD-10-CM

## 2013-06-20 LAB — URINALYSIS, ROUTINE W REFLEX MICROSCOPIC
Nitrite: NEGATIVE
Protein, ur: 100 mg/dL — AB
Specific Gravity, Urine: 1.019 (ref 1.005–1.030)
pH: 5 (ref 5.0–8.0)

## 2013-06-20 LAB — HEMOGLOBIN A1C
Hgb A1c MFr Bld: 6.9 % — ABNORMAL HIGH (ref <5.7)
Mean Plasma Glucose: 151 mg/dL — ABNORMAL HIGH (ref <117)

## 2013-06-20 LAB — CBC
MCH: 31.6 pg (ref 26.0–34.0)
Platelets: 222 10*3/uL (ref 150–400)

## 2013-06-20 LAB — GLUCOSE, CAPILLARY
Glucose-Capillary: 190 mg/dL — ABNORMAL HIGH (ref 70–99)
Glucose-Capillary: 162 mg/dL — ABNORMAL HIGH (ref 70–99)

## 2013-06-20 LAB — RAPID URINE DRUG SCREEN, HOSP PERFORMED
Barbiturates: NOT DETECTED
Benzodiazepines: NOT DETECTED
Cocaine: NOT DETECTED
Tetrahydrocannabinol: NOT DETECTED

## 2013-06-20 LAB — CREATININE, SERUM: GFR calc Af Amer: 5 mL/min — ABNORMAL LOW (ref >90)

## 2013-06-20 LAB — LIPID PANEL
Cholesterol: 161 mg/dL (ref 0–200)
HDL: 65 mg/dL (ref >39)

## 2013-06-20 LAB — URINE MICROSCOPIC-ADD ON

## 2013-06-20 LAB — VALPROIC ACID LEVEL: Valproic Acid Lvl: 11.9 ug/mL — ABNORMAL LOW (ref 50.0–100.0)

## 2013-06-20 MED ORDER — DIVALPROEX SODIUM ER 500 MG PO TB24
500.0000 mg | ORAL_TABLET | ORAL | Status: AC
  Administered 2013-06-20: 500 mg via ORAL
  Filled 2013-06-20: qty 1

## 2013-06-20 MED ORDER — INSULIN ASPART 100 UNIT/ML ~~LOC~~ SOLN
0.0000 [IU] | Freq: Three times a day (TID) | SUBCUTANEOUS | Status: DC
  Administered 2013-06-20: 2 [IU] via SUBCUTANEOUS
  Administered 2013-06-21 (×3): 1 [IU] via SUBCUTANEOUS
  Administered 2013-06-22 (×2): 2 [IU] via SUBCUTANEOUS
  Administered 2013-06-23: 1 [IU] via SUBCUTANEOUS

## 2013-06-20 MED ORDER — STROKE: EARLY STAGES OF RECOVERY BOOK
Freq: Once | Status: AC
  Administered 2013-06-21: 09:00:00
  Filled 2013-06-20: qty 1

## 2013-06-20 MED ORDER — LORAZEPAM 2 MG/ML IJ SOLN
INTRAMUSCULAR | Status: AC
  Administered 2013-06-20: 1 mg via INTRAVENOUS
  Filled 2013-06-20: qty 1

## 2013-06-20 MED ORDER — DIVALPROEX SODIUM ER 500 MG PO TB24
1500.0000 mg | ORAL_TABLET | Freq: Every day | ORAL | Status: DC
  Administered 2013-06-21 – 2013-06-23 (×3): 1500 mg via ORAL
  Filled 2013-06-20 (×3): qty 3

## 2013-06-20 MED ORDER — LORAZEPAM 2 MG/ML IJ SOLN
1.0000 mg | Freq: Once | INTRAMUSCULAR | Status: AC
  Administered 2013-06-20: 1 mg via INTRAVENOUS

## 2013-06-20 NOTE — Progress Notes (Signed)
EEG Completed; Results Pending  

## 2013-06-20 NOTE — Procedures (Signed)
History: 72 year old male admitted for transient slurred speech  Background: The preponderance of this EEG is recorded in sleep, however during maximal wakefulness there do appear to be normal rhythms with  a well defined posterior dominant rhythm of 8.5 Hz that attenuates with eye opening. Sleep structures are normal. There are intermittent sharp wave discharges sometimes seen in F8, T8 > Fp2, F4 and sometimes at Fp2, F4, F8  Photic stimulation: Physiologic driving is not performed  EEG Abnormalities: 1) right frontotemporal sharp waves  Clinical Interpretation: This EEG is most consistent with an area of potential epileptogenicity in the right frontotemporal region. There is no seizure recorded.  Ritta Slot, MD [facility] [phone]  If 7pm- 7am, please page neurology on call at [phone].

## 2013-06-20 NOTE — Progress Notes (Signed)
UR completed; B Lanai Conlee RN,BSN,MHA 

## 2013-06-20 NOTE — Progress Notes (Addendum)
[facility] PROGRESS NOTE  [name] [name] WGN:[id number] DOB: Jul 10, 1941 DOA: 06/19/2013 PCP: [name], MD  Assessment/Plan: 1. Transient slurred speech - EEG ordered and pending - Neurology on board. - MRI of brain reports no acute infarct - MRA of head reports intracranial atherosclerotic type changes - Will f/u with neurology recommendations  2. ESRD on HD - Will consult Nephrology next am for management of HD  3. Seizure d/o - May have contributed to # 1 - Currently on Depakote  4. 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 PAF - on Metoprolol, will plan on continuing. Currently rate controlled  Addendum 6. DM - Will place on SSI (S/Renal) - continue to monitor blood sugars.  Code Status: full Family Communication: Discussed with patient and wife at bedside Disposition Plan: Pending further evaluation and recommendations from neurology and physical therapy   Consultants:  Neurology  Physical therapy  Procedures:  none  Antibiotics:  None  HPI/Subjective: No new complaints overnight. No new complaints. No other reports of slurred speech.  Objective: Filed Vitals:   06/20/13 1200  BP: 129/63  Pulse: 59  Temp: 97.3 F (36.3 C)  Resp: 18    Intake/Output Summary (Last 24 hours) at 06/20/13 1434 Last data filed at 06/20/13 0828  Gross per 24 hour  Intake    240 ml  Output      0 ml  Net    240 ml   Filed Weights   06/19/13 1705 06/19/13 2127  Weight: 114.4 kg (252 lb 3.3 oz) 113.807 kg (250 lb 14.4 oz)    Exam:   General:  Pt in NAD, Alert and Awake  Cardiovascular: normal s1 and s2, no murmurs  Respiratory: CTA BL, no wheezes  Abdomen: soft, NT, ND  Musculoskeletal: warm and dry   Data Reviewed: Basic Metabolic Panel:  Recent Labs Lab 06/19/13 1807 06/19/13 2340  NA 135  --   K 4.6  --   CL 88*  --   CO2 27  --   GLUCOSE 228*  --   BUN 59*  --   CREATININE 10.33* 10.63*  CALCIUM 9.8  --    Liver Function  Tests: No results found for this basename: AST, ALT, ALKPHOS, BILITOT, PROT, ALBUMIN,  in the last 168 hours No results found for this basename: LIPASE, AMYLASE,  in the last 168 hours No results found for this basename: AMMONIA,  in the last 168 hours CBC:  Recent Labs Lab 06/19/13 1807 06/19/13 2340  WBC 9.9 10.5  HGB 12.4* 11.6*  HCT 35.4* 33.3*  MCV 91.2 90.7  PLT 284 222   Cardiac Enzymes: No results found for this basename: CKTOTAL, CKMB, CKMBINDEX, TROPONINI,  in the last 168 hours BNP (last 3 results)  Recent Labs  05/26/13 1700  PROBNP 699.3*   CBG:  Recent Labs Lab 06/19/13 1702 06/20/13 0555 06/20/13 1127  GLUCAP 231* 190* 162*    No results found for this or any previous visit (from the past 240 hour(s)).   Studies: Dg Chest 2 View  06/19/2013   CLINICAL DATA:  Hypertension, tremors  EXAM: CHEST  2 VIEW  COMPARISON:  05/26/2013  FINDINGS: The cardiac shadow is stable. The lungs are well aerated bilaterally. The lungs are well aerated. Some mild elevation of the right hemidiaphragm is again seen. Mild right basilar atelectasis is noted. No sizable effusion is seen.  IMPRESSION: Right basilar atelectasis.   Electronically Signed   By: Alcide Clever M.D.   On:  06/19/2013 17:45   Ct Head Wo Contrast  06/19/2013   *RADIOLOGY REPORT*  Clinical Data: Slurred speech, shaking  CT HEAD WITHOUT CONTRAST  Technique:  Contiguous axial images were obtained from the base of the skull through the vertex without contrast.  Comparison: None.  Findings: No acute intracranial hemorrhage, mass lesion, mass effect, midline shift or hydrocephalus.  Gray-white differentiation is preserved throughout.  Rounded area of relative hypoattenuation in the superior aspect of the posterior limb left internal capsule extending cephalad into the corona radiata may represent an area of acute ischemia.  Extensive encephalomalacia in the inferior right temporal lobe combined with the surgical changes  of right temporal craniotomy suggest prior neurosurgical intervention. Encephalomalacia extends into the right cerebellum. Periventricular white matter hypoattenuation is suggestive of underlying chronic microvascular ischemia.  No acute soft tissue or calvarial abnormality.  Intracranial atherosclerosis in the bilateral cavernous carotid arteries. Normal aeration of the mastoid air cells and paranasal sinuses.  IMPRESSION:  1.  Rounded focus of low attenuation in the superior aspect of the posterior limb of the left internal capsule extending toward the corona radiata may represent an area of acute / subacute ischemia. MRI could further evaluate if clinically warranted.  2.  Surgical changes of prior right temporal craniotomy with extensive encephalomalacia in the right temporal lobe and inferior right cerebellum.  Query history of prior tumor resection or intracranial hematoma evacuation.  3.  Intracranial atherosclerosis and the sequela of chronic microvascular white matter disease.   Original Report Authenticated By: Malachy Moan, M.D.   Mri Brain Without Contrast  06/20/2013   CLINICAL DATA:  73 year old male. End-stage renal disease. Atrial fibrillation. Hypertensive diabetic hyperlipidemic patient with history of seizures. Brain tumor post resection (patient unsure of type of tumor and followed at [facility]). Presenting to emergency room which chills.  EXAM: MRI HEAD WITHOUT CONTRAST; MRA HEAD WITHOUT CONTRAST  TECHNIQUE: Multiplanar, multisequence MR imaging was performed. No intravenous contrast was administered.  COMPARISON:  06/19/2013 CT. No comparison MR.  FINDINGS: Exam is motion degraded.  No acute infarct.  No intracranial hemorrhage.  Prior right frontal craniotomy. Encephalomalacia right temporal lobe. No definitive findings of recurrent tumor although evaluation limited without contrast or availability of comparison prior postoperative exams.  Atrophy.  Major intracranial vascular  structures are patent.  Minimal paranasal sinus mucosal thickening.  Minimal exophthalmos.  Very mild spinal stenosis C3-4.  IMPRESSION: Exam is motion degraded.  No acute infarct.  No intracranial hemorrhage.  Prior right frontal craniotomy. Encephalomalacia right temporal lobe. No definitive findings of recurrent tumor although evaluation limited without contrast or availability of comparison prior postoperative exams.  Atrophy.  MRA circle Willis:  Findings :  Exam is motion degraded.  Limited for evaluating for aneurysm or grading stenosis accurately.  Ectatic cavernous segment of the internal carotid artery bilaterally.  Ectatic distal vertical cervical segment of the right internal carotid artery.  Moderate narrowing A1 segment left anterior cerebral artery and A2 segment left anterior cerebral artery.  Moderate narrowing M1 segment right middle cerebral artery.  Middle cerebral artery moderate to marked branch vessel irregularity bilaterally.  Mild to moderate narrowing distal left vertebral artery.  Nonvisualization PICAs and left AICA.  Small right AICA.  Mild to moderate narrowing basilar artery. Evaluation limited by motion.  Moderate to marked narrowing superior cerebral artery bilaterally.  Mild to moderate narrowing posterior cerebral artery proximally and distally with most notable stenosis P1 segment right posterior cerebral artery.  Impression :  Exam is motion  degraded.  Intracranial atherosclerotic type changes as noted above.   Electronically Signed   By: Bridgett Larsson M.D.   On: 06/20/2013 09:53   Mr Maxine Glenn Head/brain Wo Cm  06/20/2013   CLINICAL DATA:  72 year old male. End-stage renal disease. Atrial fibrillation. Hypertensive diabetic hyperlipidemic patient with history of seizures. Brain tumor post resection (patient unsure of type of tumor and followed at [facility]). Presenting to emergency room which chills.  EXAM: MRI HEAD WITHOUT CONTRAST; MRA HEAD WITHOUT CONTRAST  TECHNIQUE:  Multiplanar, multisequence MR imaging was performed. No intravenous contrast was administered.  COMPARISON:  06/19/2013 CT. No comparison MR.  FINDINGS: Exam is motion degraded.  No acute infarct.  No intracranial hemorrhage.  Prior right frontal craniotomy. Encephalomalacia right temporal lobe. No definitive findings of recurrent tumor although evaluation limited without contrast or availability of comparison prior postoperative exams.  Atrophy.  Major intracranial vascular structures are patent.  Minimal paranasal sinus mucosal thickening.  Minimal exophthalmos.  Very mild spinal stenosis C3-4.  IMPRESSION: Exam is motion degraded.  No acute infarct.  No intracranial hemorrhage.  Prior right frontal craniotomy. Encephalomalacia right temporal lobe. No definitive findings of recurrent tumor although evaluation limited without contrast or availability of comparison prior postoperative exams.  Atrophy.  MRA circle Willis:  Findings :  Exam is motion degraded.  Limited for evaluating for aneurysm or grading stenosis accurately.  Ectatic cavernous segment of the internal carotid artery bilaterally.  Ectatic distal vertical cervical segment of the right internal carotid artery.  Moderate narrowing A1 segment left anterior cerebral artery and A2 segment left anterior cerebral artery.  Moderate narrowing M1 segment right middle cerebral artery.  Middle cerebral artery moderate to marked branch vessel irregularity bilaterally.  Mild to moderate narrowing distal left vertebral artery.  Nonvisualization PICAs and left AICA.  Small right AICA.  Mild to moderate narrowing basilar artery. Evaluation limited by motion.  Moderate to marked narrowing superior cerebral artery bilaterally.  Mild to moderate narrowing posterior cerebral artery proximally and distally with most notable stenosis P1 segment right posterior cerebral artery.  Impression :  Exam is motion degraded.  Intracranial atherosclerotic type changes as noted above.    Electronically Signed   By: Bridgett Larsson M.D.   On: 06/20/2013 09:53    Scheduled Meds: . aspirin EC  325 mg Oral Daily  . atorvastatin  40 mg Oral Daily  . [START ON 06/21/2013] divalproex  1,500 mg Oral Daily  . gabapentin  100 mg Oral QHS  . heparin  5,000 Units Subcutaneous Q8H  . metoprolol tartrate  12.5 mg Oral BID  . sevelamer carbonate  3,200 mg Oral TID WC   Continuous Infusions:   Principal Problem:   Acute CVA (cerebrovascular accident) Active Problems:   Atherosclerotic peripheral vascular disease with gangrene   ESRD (end stage renal disease) on dialysis   Paroxysmal atrial fibrillation   Seizure disorder   Refusal of blood transfusions as patient is Jehovah's Witness   Chills (without fever)    Time spent: > 35 minutes    Penny Pia  [facility] Pager [phone]. If 7PM-7AM, please contact night-coverage at [email or web address], password [facility] 06/20/2013, 2:34 PM  LOS: 1 day

## 2013-06-20 NOTE — Progress Notes (Addendum)
Stroke Team Progress Note  Referring Physician: Dr. Gwendolyn Grant    Chief Complaint: Transient slurred speech.  History: Jamall Strohmeier is an 72 y.o. male history of end-stage renal disease on dialysis, atrial fibrillation, hypertension, diabetes mellitus and hyperlipidemia was brought to the emergency room for evaluation of possible TIA or stroke. Patient has had intermittent episodes of shaking of his extremities on day of admission. During one of those episodes he had a 15 minute period of slurred speech which was noticed by his wife. Symptoms lasted about 15 minutes then resolved with no recurrence, even though he's had recurrent episodes of shaking of his extremities. Patient has been afebrile. CT scan of his head showed an area of encephalomalacia involving right temporal region, as well as an area of hypodensity in the left internal capsule region indicative of acute/subacute infarct. Patient has a history of tumor resection. He also has a history of seizure disorder and is currently on Depakote. He also takes Neurontin but only takes 100 mg at bedtime only. NIH stroke score was 0. Patient is not on anticoagulation and has been taking aspirin 325 mg per day.  LSN: [id number] on 06/19/2013 tPA Given: No: Deficits resolved MRankin: 0  Subjective: feels back to baseline, no complaints; reports that this does not feel like a "seizure" he has had in the past  Past Medical History  Diagnosis Date  . Dialysis patient   . Peripheral vascular disease   . ESRD (end stage renal disease) on dialysis 09/2010    MWF  . Hypertension   . Hyperlipidemia   . Arthritis     Gout  . Paroxysmal atrial fibrillation   . Brain tumor 2009  . Seizure disorder 2009  . Anemia   . Hyperparathyroidism, secondary   . Diabetes mellitus without complication     Type II, diet controlled  . Proteus mirabilis infection   . CHF (congestive heart failure)     grade 1 DD, preserved EF, [facility] records  . CAD (coronary artery  disease)     reports that last stress test was 1-2 years ago at [facility] and was negative, but have not found these records.    . 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 brain tumor  . GERD (gastroesophageal reflux disease)     Family History  Problem Relation Age of Onset  . Seizures Mother   . Stroke Mother 82  . Hypertension Mother   . Alcohol abuse Father   . Hypertension Father   . Arthritis Sister   . Heart attack Neg Hx   . Heart failure Neg Hx      Medications: I have reviewed the patient's current medications.  Physical Examination: Blood pressure 129/63, pulse 59, temperature 97.3 F (36.3 C), temperature source Oral, resp. rate 18, height 6\' 4"  (1.93 m), weight 250 lb 14.4 oz (113.807 kg), SpO2 99.00%.  Neurologic Examination: Mental Status: Alert, oriented, thought content appropriate.  Speech fluent without evidence of aphasia. Able to follow commands without difficulty. Cranial Nerves: II-Visual fields were normal. III/IV/VI-Pupils were equal and reacted. EOMI    V/VII-no facial numbness and no facial weakness. VIII-normal. X-normal speech and symmetrical palatal movement. Motor: 5/5 bilaterally with normal tone and bulk; left BK amputation  Sensory: Reduced perception to touch below the right knee. Plantars: Flexor on the right Cerebellar: Normal finger-to-nose testing except for very slight intention tremor bilaterally. Carotid auscultation: Normal  Dg Chest 2 View  06/19/2013  CLINICAL DATA:  Hypertension, tremors  EXAM: CHEST  2 VIEW  COMPARISON:  05/26/2013  FINDINGS: The cardiac shadow is stable. The lungs are well aerated bilaterally. The lungs are well aerated. Some mild elevation of the right hemidiaphragm is again seen. Mild right basilar atelectasis is noted. No sizable effusion is seen.  IMPRESSION: Right basilar atelectasis.   Electronically Signed   By: Alcide Clever M.D.   On: 06/19/2013 17:45    Ct Head Wo Contrast  06/19/2013   *RADIOLOGY REPORT*  Clinical Data: Slurred speech, shaking  CT HEAD WITHOUT CONTRAST  Technique:  Contiguous axial images were obtained from the base of the skull through the vertex without contrast.  Comparison: None.  Findings: No acute intracranial hemorrhage, mass lesion, mass effect, midline shift or hydrocephalus.  Gray-white differentiation is preserved throughout.  Rounded area of relative hypoattenuation in the superior aspect of the posterior limb left internal capsule extending cephalad into the corona radiata may represent an area of acute ischemia.  Extensive encephalomalacia in the inferior right temporal lobe combined with the surgical changes of right temporal craniotomy suggest prior neurosurgical intervention. Encephalomalacia extends into the right cerebellum. Periventricular white matter hypoattenuation is suggestive of underlying chronic microvascular ischemia.  No acute soft tissue or calvarial abnormality.  Intracranial atherosclerosis in the bilateral cavernous carotid arteries. Normal aeration of the mastoid air cells and paranasal sinuses.  IMPRESSION:  1.  Rounded focus of low attenuation in the superior aspect of the posterior limb of the left internal capsule extending toward the corona radiata may represent an area of acute / subacute ischemia. MRI could further evaluate if clinically warranted.  2.  Surgical changes of prior right temporal craniotomy with extensive encephalomalacia in the right temporal lobe and inferior right cerebellum.  Query history of prior tumor resection or intracranial hematoma evacuation.  3.  Intracranial atherosclerosis and the sequela of chronic microvascular white matter disease.   Original Report Authenticated By: Malachy Moan, M.D.   Mri Brain Without Contrast  06/20/2013   CLINICAL DATA:  72 year old male. End-stage renal disease. Atrial fibrillation. Hypertensive diabetic hyperlipidemic patient with history  of seizures. Brain tumor post resection (patient unsure of type of tumor and followed at [facility]). Presenting to emergency room which chills.  EXAM: MRI HEAD WITHOUT CONTRAST; MRA HEAD WITHOUT CONTRAST  TECHNIQUE: Multiplanar, multisequence MR imaging was performed. No intravenous contrast was administered.  COMPARISON:  06/19/2013 CT. No comparison MR.  FINDINGS: Exam is motion degraded.  No acute infarct.  No intracranial hemorrhage.  Prior right frontal craniotomy. Encephalomalacia right temporal lobe. No definitive findings of recurrent tumor although evaluation limited without contrast or availability of comparison prior postoperative exams.  Atrophy.  Major intracranial vascular structures are patent.  Minimal paranasal sinus mucosal thickening.  Minimal exophthalmos.  Very mild spinal stenosis C3-4.  IMPRESSION: Exam is motion degraded.  No acute infarct.  No intracranial hemorrhage.  Prior right frontal craniotomy. Encephalomalacia right temporal lobe. No definitive findings of recurrent tumor although evaluation limited without contrast or availability of comparison prior postoperative exams.  Atrophy.  MRA circle Willis:  Findings :  Exam is motion degraded.  Limited for evaluating for aneurysm or grading stenosis accurately.  Ectatic cavernous segment of the internal carotid artery bilaterally.  Ectatic distal vertical cervical segment of the right internal carotid artery.  Moderate narrowing A1 segment left anterior cerebral artery and A2 segment left anterior cerebral artery.  Moderate narrowing M1 segment right middle cerebral artery.  Middle cerebral artery moderate  to marked branch vessel irregularity bilaterally.  Mild to moderate narrowing distal left vertebral artery.  Nonvisualization PICAs and left AICA.  Small right AICA.  Mild to moderate narrowing basilar artery. Evaluation limited by motion.  Moderate to marked narrowing superior cerebral artery bilaterally.  Mild to moderate  narrowing posterior cerebral artery proximally and distally with most notable stenosis P1 segment right posterior cerebral artery.  Impression :  Exam is motion degraded.  Intracranial atherosclerotic type changes as noted above.   Electronically Signed   By: Bridgett Larsson M.D.   On: 06/20/2013 09:53   Mr Maxine Glenn Head/brain Wo Cm  06/20/2013   CLINICAL DATA:  72 year old male. End-stage renal disease. Atrial fibrillation. Hypertensive diabetic hyperlipidemic patient with history of seizures. Brain tumor post resection (patient unsure of type of tumor and followed at [facility]). Presenting to emergency room which chills.  EXAM: MRI HEAD WITHOUT CONTRAST; MRA HEAD WITHOUT CONTRAST  TECHNIQUE: Multiplanar, multisequence MR imaging was performed. No intravenous contrast was administered.  COMPARISON:  06/19/2013 CT. No comparison MR.  FINDINGS: Exam is motion degraded.  No acute infarct.  No intracranial hemorrhage.  Prior right frontal craniotomy. Encephalomalacia right temporal lobe. No definitive findings of recurrent tumor although evaluation limited without contrast or availability of comparison prior postoperative exams.  Atrophy.  Major intracranial vascular structures are patent.  Minimal paranasal sinus mucosal thickening.  Minimal exophthalmos.  Very mild spinal stenosis C3-4.  IMPRESSION: Exam is motion degraded.  No acute infarct.  No intracranial hemorrhage.  Prior right frontal craniotomy. Encephalomalacia right temporal lobe. No definitive findings of recurrent tumor although evaluation limited without contrast or availability of comparison prior postoperative exams.  Atrophy.  MRA circle Willis:  Findings :  Exam is motion degraded.  Limited for evaluating for aneurysm or grading stenosis accurately.  Ectatic cavernous segment of the internal carotid artery bilaterally.  Ectatic distal vertical cervical segment of the right internal carotid artery.  Moderate narrowing A1 segment left anterior  cerebral artery and A2 segment left anterior cerebral artery.  Moderate narrowing M1 segment right middle cerebral artery.  Middle cerebral artery moderate to marked branch vessel irregularity bilaterally.  Mild to moderate narrowing distal left vertebral artery.  Nonvisualization PICAs and left AICA.  Small right AICA.  Mild to moderate narrowing basilar artery. Evaluation limited by motion.  Moderate to marked narrowing superior cerebral artery bilaterally.  Mild to moderate narrowing posterior cerebral artery proximally and distally with most notable stenosis P1 segment right posterior cerebral artery.  Impression :  Exam is motion degraded.  Intracranial atherosclerotic type changes as noted above.   Electronically Signed   By: Bridgett Larsson M.D.   On: 06/20/2013 09:53    Assessment: 72 y.o. male presenting with shaking of unclear etiology. CVA ruled out (also, Echo with EF 60% with mild-mod dilated LA and normal aortic valve; prelim doppler 1-39% ICA stenosis b/l), possibly seizure but less likely. Query mild infectious etiology, as he seems to recall feeling feverish. No obvious s/s of infection an symptoms have resolved.  Less likely side effect of depakote. Of note, valproic acid level subtherapuetic.   Stroke Risk Factors - atrial fibrillation, diabetes mellitus, family history, hyperlipidemia and hypertension  Plan: 1. Follow EEG results 2. Increase Depakote ER to 1500mg  daily 3. 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 D/c plavix given no CVA; may cont ASA 325 given h/o CAD  5. Follow PT/OT recs 6. VTE ppx -  heparin  Stacy Gardner, MD (PGY 3)  I have personally taken history, examined this patient, reviewed pertinent data, developed plan of care and agree with above Delia Heady, MD 06/20/2013, 1:07 PM

## 2013-06-20 NOTE — Progress Notes (Signed)
*  PRELIMINARY RESULTS* Vascular Ultrasound Carotid Doppler has been completed.  Preliminary findings: 1-39% ICA stenosis bilaterally. Antegrade vertebral flow.   Farrel Demark, RDMS, RVT  06/20/2013, 11:03 AM

## 2013-06-20 NOTE — Progress Notes (Signed)
Inpatient Diabetes Program Recommendations  AACE/ADA: New Consensus Statement on Inpatient Glycemic Control (2013)  Target Ranges:  Prepandial:   less than 140 mg/dL      Peak postprandial:   less than 180 mg/dL (1-2 hours)      Critically ill patients:  140 - 180 mg/dL   Reason for Visit: Results for GEORGE, HAGGART (MRN [id number]) as of 06/20/2013 11:05  Ref. Range 06/19/2013 17:02 06/20/2013 05:55  Glucose-Capillary Latest Range: 70-99 mg/dL 409 (H) 811 (H)   Note history of diabetes.  Consider sensitive Novolog correction while in the hospital.    Beryl Meager, RN, BC-ADM Inpatient Diabetes Coordinator Pager [phone]

## 2013-06-20 NOTE — Progress Notes (Signed)
  Echocardiogram 2D Echocardiogram has been performed.  [name] [name] [name] 06/20/2013, 10:42 AM

## 2013-06-21 ENCOUNTER — Encounter: Payer: Medicare Other | Admitting: Physician Assistant

## 2013-06-21 DIAGNOSIS — N390 Urinary tract infection, site not specified: Secondary | ICD-10-CM

## 2013-06-21 DIAGNOSIS — K219 Gastro-esophageal reflux disease without esophagitis: Secondary | ICD-10-CM

## 2013-06-21 DIAGNOSIS — R6883 Chills (without fever): Secondary | ICD-10-CM

## 2013-06-21 LAB — GLUCOSE, CAPILLARY

## 2013-06-21 MED ORDER — TUBERCULIN PPD 5 UNIT/0.1ML ID SOLN
5.0000 [IU] | Freq: Once | INTRADERMAL | Status: AC
  Administered 2013-06-21: 5 [IU] via INTRADERMAL
  Filled 2013-06-21: qty 0.1

## 2013-06-21 MED ORDER — VANCOMYCIN HCL 10 G IV SOLR
2500.0000 mg | Freq: Once | INTRAVENOUS | Status: AC
  Administered 2013-06-21: 2500 mg via INTRAVENOUS
  Filled 2013-06-21: qty 2500

## 2013-06-21 NOTE — Progress Notes (Signed)
Stroke Team Progress Note  Referring Physician: Dr. Gwendolyn Grant    Chief Complaint: Transient slurred speech.  History: [name] [name] is an 72 y.o. male history of end-stage renal disease on dialysis, atrial fibrillation, hypertension, diabetes mellitus and hyperlipidemia was brought to the emergency room for evaluation of possible TIA or stroke. Patient has had intermittent episodes of shaking of his extremities on day of admission. During one of those episodes he had a 15 minute period of slurred speech which was noticed by his wife. Symptoms lasted about 15 minutes then resolved with no recurrence, even though he's had recurrent episodes of shaking of his extremities. Patient has been afebrile. CT scan of his head showed an area of encephalomalacia involving right temporal region, as well as an area of hypodensity in the left internal capsule region indicative of acute/subacute infarct. Patient has a history of tumor resection. He also has a history of seizure disorder and is currently on Depakote. He also takes Neurontin but only takes 100 mg at bedtime only. NIH stroke score was 0. Patient is not on anticoagulation and has been taking aspirin 325 mg per day.  LSN: [id number] on 06/19/2013 tPA Given: No: Deficits resolved MRankin: 0  Subjective: No acute events overnight. Remains at baseline.  Past Medical History  Diagnosis Date  . Dialysis patient   . Peripheral vascular disease   . ESRD (end stage renal disease) on dialysis 09/2010    MWF  . Hypertension   . Hyperlipidemia   . Arthritis     Gout  . Paroxysmal atrial fibrillation   . Brain tumor 2009  . Seizure disorder 2009  . Anemia   . Hyperparathyroidism, secondary   . Diabetes mellitus without complication     Type II, diet controlled  . Proteus mirabilis infection   . CHF (congestive heart failure)     grade 1 DD, preserved EF, [facility] records  . CAD (coronary artery disease)     reports that last stress test was 1-2 years ago at  [facility] and was negative, but have not found these records.    . 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 brain tumor  . GERD (gastroesophageal reflux disease)     Family History  Problem Relation Age of Onset  . Seizures Mother   . Stroke Mother 19  . Hypertension Mother   . Alcohol abuse Father   . Hypertension Father   . Arthritis Sister   . Heart attack Neg Hx   . Heart failure Neg Hx      Medications: I have reviewed the patient's current medications.  Physical Examination: Blood pressure 109/52, pulse 57, temperature 97.4 F (36.3 C), temperature source Oral, resp. rate 18, height 6\' 4"  (1.93 m), weight 250 lb 14.4 oz (113.807 kg), SpO2 98.00%.  Neurologic Examination: Mental Status: Alert, oriented, thought content appropriate.  Speech fluent without evidence of aphasia. Able to follow commands without difficulty. Cranial Nerves: II-Visual fields were normal. III/IV/VI-Pupils were equal and reacted. EOMI    V/VII-no facial numbness and no facial weakness. VIII-normal. X-normal speech and symmetrical palatal movement. Motor: 5/5 bilaterally with normal tone and bulk; left BK amputation  Sensory: Reduced perception to touch below the right knee. Plantars: Flexor on the right Cerebellar: Normal finger-to-nose testing except for very slight intention tremor bilaterally. Carotid auscultation: Normal  Dg Chest 2 View  06/19/2013   CLINICAL DATA:  Hypertension, tremors  EXAM: CHEST  2 VIEW  COMPARISON:  05/26/2013  FINDINGS: The cardiac shadow is stable. The lungs are well aerated bilaterally. The lungs are well aerated. Some mild elevation of the right hemidiaphragm is again seen. Mild right basilar atelectasis is noted. No sizable effusion is seen.  IMPRESSION: Right basilar atelectasis.   Electronically Signed   By: Alcide Clever M.D.   On: 06/19/2013 17:45   Ct Head Wo Contrast  06/19/2013   *RADIOLOGY REPORT*   Clinical Data: Slurred speech, shaking  CT HEAD WITHOUT CONTRAST  Technique:  Contiguous axial images were obtained from the base of the skull through the vertex without contrast.  Comparison: None.  Findings: No acute intracranial hemorrhage, mass lesion, mass effect, midline shift or hydrocephalus.  Gray-white differentiation is preserved throughout.  Rounded area of relative hypoattenuation in the superior aspect of the posterior limb left internal capsule extending cephalad into the corona radiata may represent an area of acute ischemia.  Extensive encephalomalacia in the inferior right temporal lobe combined with the surgical changes of right temporal craniotomy suggest prior neurosurgical intervention. Encephalomalacia extends into the right cerebellum. Periventricular white matter hypoattenuation is suggestive of underlying chronic microvascular ischemia.  No acute soft tissue or calvarial abnormality.  Intracranial atherosclerosis in the bilateral cavernous carotid arteries. Normal aeration of the mastoid air cells and paranasal sinuses.  IMPRESSION:  1.  Rounded focus of low attenuation in the superior aspect of the posterior limb of the left internal capsule extending toward the corona radiata may represent an area of acute / subacute ischemia. MRI could further evaluate if clinically warranted.  2.  Surgical changes of prior right temporal craniotomy with extensive encephalomalacia in the right temporal lobe and inferior right cerebellum.  Query history of prior tumor resection or intracranial hematoma evacuation.  3.  Intracranial atherosclerosis and the sequela of chronic microvascular white matter disease.   Original Report Authenticated By: Malachy Moan, M.D.   Mri Brain Without Contrast  06/20/2013   CLINICAL DATA:  72 year old male. End-stage renal disease. Atrial fibrillation. Hypertensive diabetic hyperlipidemic patient with history of seizures. Brain tumor post resection (patient unsure of  type of tumor and followed at [facility]). Presenting to emergency room which chills.  EXAM: MRI HEAD WITHOUT CONTRAST; MRA HEAD WITHOUT CONTRAST  TECHNIQUE: Multiplanar, multisequence MR imaging was performed. No intravenous contrast was administered.  COMPARISON:  06/19/2013 CT. No comparison MR.  FINDINGS: Exam is motion degraded.  No acute infarct.  No intracranial hemorrhage.  Prior right frontal craniotomy. Encephalomalacia right temporal lobe. No definitive findings of recurrent tumor although evaluation limited without contrast or availability of comparison prior postoperative exams.  Atrophy.  Major intracranial vascular structures are patent.  Minimal paranasal sinus mucosal thickening.  Minimal exophthalmos.  Very mild spinal stenosis C3-4.  IMPRESSION: Exam is motion degraded.  No acute infarct.  No intracranial hemorrhage.  Prior right frontal craniotomy. Encephalomalacia right temporal lobe. No definitive findings of recurrent tumor although evaluation limited without contrast or availability of comparison prior postoperative exams.  Atrophy.  MRA circle Willis:  Findings :  Exam is motion degraded.  Limited for evaluating for aneurysm or grading stenosis accurately.  Ectatic cavernous segment of the internal carotid artery bilaterally.  Ectatic distal vertical cervical segment of the right internal carotid artery.  Moderate narrowing A1 segment left anterior cerebral artery and A2 segment left anterior cerebral artery.  Moderate narrowing M1 segment right middle cerebral artery.  Middle cerebral artery moderate to marked branch vessel irregularity bilaterally.  Mild to moderate narrowing distal  left vertebral artery.  Nonvisualization PICAs and left AICA.  Small right AICA.  Mild to moderate narrowing basilar artery. Evaluation limited by motion.  Moderate to marked narrowing superior cerebral artery bilaterally.  Mild to moderate narrowing posterior cerebral artery proximally and distally with  most notable stenosis P1 segment right posterior cerebral artery.  Impression :  Exam is motion degraded.  Intracranial atherosclerotic type changes as noted above.   Electronically Signed   By: Bridgett Larsson M.D.   On: 06/20/2013 09:53   Mr Maxine Glenn Head/brain Wo Cm  06/20/2013   CLINICAL DATA:  72 year old male. End-stage renal disease. Atrial fibrillation. Hypertensive diabetic hyperlipidemic patient with history of seizures. Brain tumor post resection (patient unsure of type of tumor and followed at [facility]). Presenting to emergency room which chills.  EXAM: MRI HEAD WITHOUT CONTRAST; MRA HEAD WITHOUT CONTRAST  TECHNIQUE: Multiplanar, multisequence MR imaging was performed. No intravenous contrast was administered.  COMPARISON:  06/19/2013 CT. No comparison MR.  FINDINGS: Exam is motion degraded.  No acute infarct.  No intracranial hemorrhage.  Prior right frontal craniotomy. Encephalomalacia right temporal lobe. No definitive findings of recurrent tumor although evaluation limited without contrast or availability of comparison prior postoperative exams.  Atrophy.  Major intracranial vascular structures are patent.  Minimal paranasal sinus mucosal thickening.  Minimal exophthalmos.  Very mild spinal stenosis C3-4.  IMPRESSION: Exam is motion degraded.  No acute infarct.  No intracranial hemorrhage.  Prior right frontal craniotomy. Encephalomalacia right temporal lobe. No definitive findings of recurrent tumor although evaluation limited without contrast or availability of comparison prior postoperative exams.  Atrophy.  MRA circle Willis:  Findings :  Exam is motion degraded.  Limited for evaluating for aneurysm or grading stenosis accurately.  Ectatic cavernous segment of the internal carotid artery bilaterally.  Ectatic distal vertical cervical segment of the right internal carotid artery.  Moderate narrowing A1 segment left anterior cerebral artery and A2 segment left anterior cerebral artery.  Moderate  narrowing M1 segment right middle cerebral artery.  Middle cerebral artery moderate to marked branch vessel irregularity bilaterally.  Mild to moderate narrowing distal left vertebral artery.  Nonvisualization PICAs and left AICA.  Small right AICA.  Mild to moderate narrowing basilar artery. Evaluation limited by motion.  Moderate to marked narrowing superior cerebral artery bilaterally.  Mild to moderate narrowing posterior cerebral artery proximally and distally with most notable stenosis P1 segment right posterior cerebral artery.  Impression :  Exam is motion degraded.  Intracranial atherosclerotic type changes as noted above.   Electronically Signed   By: Bridgett Larsson M.D.   On: 06/20/2013 09:53    Assessment: 72 y.o. male presenting with shaking of unclear etiology. CVA ruled out (also, Echo with EF 60% with mild-mod dilated LA and normal aortic valve; prelim doppler 1-39% ICA stenosis b/l, final read pending), possibly seizure but less likely.  EEG with area of potential epileptogenicity in right frontotemporal region, no seizure recorded.  Stroke Risk Factors - atrial fibrillation, diabetes mellitus, family history, hyperlipidemia and hypertension  Plan: 1. Continue Depakote ER 1500mg  daily - would have trough level checked at HD on Friday 2. 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 Follow PT recs 4. VTE ppx - heparin TID 5. Stable from neurology point of view, neuro to sign off - Patient should f/u with Dr. Marjory Lies (scheduled for Nov)  Stacy Gardner, MD (PGY 3)  I have personally taken history, examined  this patient, reviewed pertinent data, developed plan of care and agree with above Delia Heady, MD

## 2013-06-21 NOTE — Clinical Social Work Psychosocial (Signed)
Clinical Social Work Department BRIEF PSYCHOSOCIAL ASSESSMENT 06/21/2013  Patient:  [name] [name],[name] [name]     Account Number:  [id number]     Admit date:  06/19/2013  Clinical Social Worker:  Robin Searing  Date/Time:  06/21/2013 02:12 PM  Referred by:  Physician  Date Referred:  06/21/2013 Referred for  SNF Placement   Other Referral:   Interview type:  Other - See comment Other interview type:   Met with patient and his wife at bedside    PSYCHOSOCIAL DATA Living Status:  FAMILY Admitted from facility:   Level of care:   Primary support name:  wife- Santina Evans [phone] Primary support relationship to patient:  FAMILY Degree of support available:   wife voices inability to take him home at d/c    CURRENT CONCERNS Current Concerns  Post-Acute Placement   Other Concerns:    SOCIAL WORK ASSESSMENT / PLAN Patient admitted from home where he lives with his wife of 10 months- patient reports that he is quite independent at home however wife is indicated that she cannot take him home.    Patient has been seen by PT who is recommending ALF given wife cannot take home- PT states he does not need SNF-    Patient has [place] and [place].   Assessment/plan status:  Other - See comment Other assessment/ plan:   Will complete FL2 and PASARR for ALF search   Information/referral to community resources:   ALF  Medicaid    PATIENT'S/FAMILY'S RESPONSE TO PLAN OF CARE: Patient and wife are receptive to ALF search- they are aware that ALF will require his SS check for payment along with Medicaid.  Wife has concerned his check going to the ALF because she feels she needs it to pay their home bills and life insurance. Encouraged her to speak with his DSS Medicaid worker-       Reece Levy, MSW [phone]

## 2013-06-21 NOTE — Progress Notes (Signed)
ANTIBIOTIC CONSULT NOTE - INITIAL  Pharmacy Consult for vancomycin Indication: UTI  No Known Allergies  Patient Measurements: Height: 6\' 4"  (193 cm) Weight: 250 lb 14.4 oz (113.807 kg) IBW/kg (Calculated) : 86.8   Vital Signs: Temp: 98.5 F (36.9 C) (10/07 1034) Temp src: Oral (10/07 1034) BP: 113/59 mmHg (10/07 1034) Pulse Rate: 61 (10/07 1034) Intake/Output from previous day: 10/06 0701 - 10/07 0700 In: 240 [P.O.:240] Out: 400 [Urine:400] Intake/Output from this shift: Total I/O In: 640 [P.O.:640] Out: -   Labs:  Recent Labs  06/19/13 1807 06/19/13 2340  WBC 9.9 10.5  HGB 12.4* 11.6*  PLT 284 222  CREATININE 10.33* 10.63*   Estimated Creatinine Clearance: 8.5 ml/min (by C-G formula based on Cr of 10.63). No results found for this basename: VANCOTROUGH, VANCOPEAK, VANCORANDOM, GENTTROUGH, GENTPEAK, GENTRANDOM, TOBRATROUGH, TOBRAPEAK, TOBRARND, AMIKACINPEAK, AMIKACINTROU, AMIKACIN,  in the last 72 hours   Microbiology: Recent Results (from the past 720 hour(s))  CULTURE, BLOOD (ROUTINE X 2)     Status: None   Collection Time    06/20/13  1:30 AM      Result Value Range Status   Specimen Description BLOOD RIGHT ARM   Final   Special Requests BOTTLES DRAWN AEROBIC ONLY 5CC   Final   Culture  Setup Time     Final   Value: 06/20/2013 09:29     Performed at [facility]   Culture     Final   Value:        BLOOD CULTURE RECEIVED NO GROWTH TO DATE CULTURE WILL BE HELD FOR 5 DAYS BEFORE ISSUING A FINAL NEGATIVE REPORT     Performed at [facility]   Report Status PENDING   Incomplete  CULTURE, BLOOD (ROUTINE X 2)     Status: None   Collection Time    06/20/13  1:40 AM      Result Value Range Status   Specimen Description BLOOD RIGHT ARM   Final   Special Requests BOTTLES DRAWN AEROBIC ONLY 10CC   Final   Culture  Setup Time     Final   Value: 06/20/2013 09:28     Performed at [facility]   Culture     Final   Value:        BLOOD  CULTURE RECEIVED NO GROWTH TO DATE CULTURE WILL BE HELD FOR 5 DAYS BEFORE ISSUING A FINAL NEGATIVE REPORT     Performed at [facility]   Report Status PENDING   Incomplete  URINE CULTURE     Status: None   Collection Time    06/20/13  3:01 PM      Result Value Range Status   Specimen Description URINE, CLEAN CATCH   Final   Special Requests NONE   Final   Culture  Setup Time     Final   Value: 06/19/2013 16:05     Performed at [facility] Count     Final   Value: >=100,000 COLONIES/ML     Performed at [facility]   Culture     Final   Value: ENTEROCOCCUS SPECIES     Performed at [facility]   Report Status PENDING   Incomplete    Medical History: Past Medical History  Diagnosis Date  . Dialysis patient   . Peripheral vascular disease   . ESRD (end stage renal disease) on dialysis 09/2010    MWF  . Hypertension   . Hyperlipidemia   .  Arthritis     Gout  . Paroxysmal atrial fibrillation   . Brain tumor 2009  . Seizure disorder 2009  . Anemia   . Hyperparathyroidism, secondary   . Diabetes mellitus without complication     Type II, diet controlled  . Proteus mirabilis infection   . CHF (congestive heart failure)     grade 1 DD, preserved EF, [facility] records  . CAD (coronary artery disease)     reports that last stress test was 1-2 years ago at [facility] and was negative, but have not found these records.    . 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 brain tumor  . GERD (gastroesophageal reflux disease)     Assessment: 27 YOM admitted for TIA/stroke who is now having chills and found to have significant growth of enterococcus on urine culture. As cephalosporins do not cover enterococcus, vancomycin is being used empirically. Patient is ESRD on HD and renal team is being consulted for HD scheduling- has not gone to HD yet during this admission.   Goal of Therapy:  pre-HD  vancomycin level 15-39mcg/mL  Plan:  1. Give loading dose of vancomycin 2500mg  IV x1 now 2. Follow up HD scheduling for additional doses- qualifies for vancomycin 1000mg  IV after each full HD session 3. Follow up cultures/sensitivies, clinical progression, LOT, ability to de-escalate and levels when indicated  Tahj Njoku D. Theresa Wedel, PharmD Clinical Pharmacist Pager: [phone] 06/21/2013 1:09 PM

## 2013-06-21 NOTE — Progress Notes (Addendum)
[facility] PROGRESS NOTE  [name] [name] ZOX:[id number] DOB: 1941-05-06 DOA: 06/19/2013 PCP: [name], MD  Brief Narrative: 72 y/o with h/o ESRD with dialysis on ([facility]) per patient that presented with complaints of chills and slurred speech.  Patient underwent Stroke/TIA work up. Negative Stroke work up and patient afebrile and with no source of active infection.  Patient's wife concerned patient will require more care at home than she is able to provide and as such patient currently awaiting PT evaluation and recommendations and possible placement into ALF.  Assessment/Plan: 1. Transient slurred speech - Resolved - Patient will work up for stroke/tia which was negative, including and EEG. Neurology recommends the following: 1. Continue Depakote ER 1500 mg daily - would have trough level checked at HD on Friday  2. 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 Follow PT recs  4. VTE ppx - heparin TID  5. Stable from neurology point of view, neuro to sign off - Patient should f/u with Dr. Marjory Lies (scheduled for Nov)  Started discharge planning and currently awaiting PT's recommendations.  Patient inquiring about ALF and social worker currently assessing case.  Addendum 2. Chills - Patient initially came in with chills. Urine culture has grown > 100,000 CFU of enterococcus - While susceptibilities pending will cover empirically with vancomycin discussed with pharmacy  3. ESRD on HD - Nephrology consulted for HD today 06/21/13  4. Seizure d/o - Currently on Depakote  5. PVF - on aspirin and statin  6. PAF - on Metoprolol, will plan on continuing. Currently rate controlled  7. DM - Will place on SSI (S/Renal) - continue to monitor blood sugars.  Code Status: full Family Communication: Discussed with patient and wife at bedside Disposition Plan: Currently awaiting placement and urine culture  results.   Consultants:  Neurology  Physical therapy  Procedures:  none  Antibiotics:  None  HPI/Subjective: Patient denies any slurred speech. Patient had no complaints of increased sadness or anhedonia to me today.   Objective: Filed Vitals:   06/21/13 1034  BP: 113/59  Pulse: 61  Temp: 98.5 F (36.9 C)  Resp: 20    Intake/Output Summary (Last 24 hours) at 06/21/13 1235 Last data filed at 06/21/13 1232  Gross per 24 hour  Intake    640 ml  Output    400 ml  Net    240 ml   Filed Weights   06/19/13 1705 06/19/13 2127  Weight: 114.4 kg (252 lb 3.3 oz) 113.807 kg (250 lb 14.4 oz)    Exam:   General:  Pt in NAD, Alert and Awake  Cardiovascular: normal s1 and s2, no murmurs  Respiratory: CTA BL, no wheezes  Abdomen: soft, NT, ND  Musculoskeletal: warm and dry   Data Reviewed: Basic Metabolic Panel:  Recent Labs Lab 06/19/13 1807 06/19/13 2340  NA 135  --   K 4.6  --   CL 88*  --   CO2 27  --   GLUCOSE 228*  --   BUN 59*  --   CREATININE 10.33* 10.63*  CALCIUM 9.8  --    Liver Function Tests: No results found for this basename: AST, ALT, ALKPHOS, BILITOT, PROT, ALBUMIN,  in the last 168 hours No results found for this basename: LIPASE, AMYLASE,  in the last 168 hours No results found for this basename: AMMONIA,  in the last 168 hours CBC:  Recent Labs Lab  06/19/13 1807 06/19/13 2340  WBC 9.9 10.5  HGB 12.4* 11.6*  HCT 35.4* 33.3*  MCV 91.2 90.7  PLT 284 222   Cardiac Enzymes: No results found for this basename: CKTOTAL, CKMB, CKMBINDEX, TROPONINI,  in the last 168 hours BNP (last 3 results)  Recent Labs  05/26/13 1700  PROBNP 699.3*   CBG:  Recent Labs Lab 06/20/13 1127 06/20/13 1619 06/20/13 2106 06/21/13 0702 06/21/13 1128  GLUCAP 162* 159* 133* 126* 125*    Recent Results (from the past 240 hour(s))  CULTURE, BLOOD (ROUTINE X 2)     Status: None   Collection Time    06/20/13  1:30 AM      Result Value Range  Status   Specimen Description BLOOD RIGHT ARM   Final   Special Requests BOTTLES DRAWN AEROBIC ONLY 5CC   Final   Culture  Setup Time     Final   Value: 06/20/2013 09:29     Performed at [facility]   Culture     Final   Value:        BLOOD CULTURE RECEIVED NO GROWTH TO DATE CULTURE WILL BE HELD FOR 5 DAYS BEFORE ISSUING A FINAL NEGATIVE REPORT     Performed at [facility]   Report Status PENDING   Incomplete  CULTURE, BLOOD (ROUTINE X 2)     Status: None   Collection Time    06/20/13  1:40 AM      Result Value Range Status   Specimen Description BLOOD RIGHT ARM   Final   Special Requests BOTTLES DRAWN AEROBIC ONLY 10CC   Final   Culture  Setup Time     Final   Value: 06/20/2013 09:28     Performed at [facility]   Culture     Final   Value:        BLOOD CULTURE RECEIVED NO GROWTH TO DATE CULTURE WILL BE HELD FOR 5 DAYS BEFORE ISSUING A FINAL NEGATIVE REPORT     Performed at [facility]   Report Status PENDING   Incomplete  URINE CULTURE     Status: None   Collection Time    06/20/13  3:01 PM      Result Value Range Status   Specimen Description URINE, CLEAN CATCH   Final   Special Requests NONE   Final   Culture  Setup Time     Final   Value: 06/19/2013 16:05     Performed at [facility] Count     Final   Value: >=100,000 COLONIES/ML     Performed at [facility]   Culture     Final   Value: ENTEROCOCCUS SPECIES     Performed at [facility]   Report Status PENDING   Incomplete     Studies: Dg Chest 2 View  06/19/2013   CLINICAL DATA:  Hypertension, tremors  EXAM: CHEST  2 VIEW  COMPARISON:  05/26/2013  FINDINGS: The cardiac shadow is stable. The lungs are well aerated bilaterally. The lungs are well aerated. Some mild elevation of the right hemidiaphragm is again seen. Mild right basilar atelectasis is noted. No sizable effusion is seen.  IMPRESSION: Right basilar atelectasis.   Electronically Signed    By: Alcide Clever M.D.   On: 06/19/2013 17:45   Ct Head Wo Contrast  06/19/2013   *RADIOLOGY REPORT*  Clinical Data: Slurred speech, shaking  CT HEAD WITHOUT CONTRAST  Technique:  Contiguous axial images were obtained from the base of the skull through the vertex without contrast.  Comparison: None.  Findings: No acute intracranial hemorrhage, mass lesion, mass effect, midline shift or hydrocephalus.  Gray-white differentiation is preserved throughout.  Rounded area of relative hypoattenuation in the superior aspect of the posterior limb left internal capsule extending cephalad into the corona radiata may represent an area of acute ischemia.  Extensive encephalomalacia in the inferior right temporal lobe combined with the surgical changes of right temporal craniotomy suggest prior neurosurgical intervention. Encephalomalacia extends into the right cerebellum. Periventricular white matter hypoattenuation is suggestive of underlying chronic microvascular ischemia.  No acute soft tissue or calvarial abnormality.  Intracranial atherosclerosis in the bilateral cavernous carotid arteries. Normal aeration of the mastoid air cells and paranasal sinuses.  IMPRESSION:  1.  Rounded focus of low attenuation in the superior aspect of the posterior limb of the left internal capsule extending toward the corona radiata may represent an area of acute / subacute ischemia. MRI could further evaluate if clinically warranted.  2.  Surgical changes of prior right temporal craniotomy with extensive encephalomalacia in the right temporal lobe and inferior right cerebellum.  Query history of prior tumor resection or intracranial hematoma evacuation.  3.  Intracranial atherosclerosis and the sequela of chronic microvascular white matter disease.   Original Report Authenticated By: Malachy Moan, M.D.   Mri Brain Without Contrast  06/20/2013   CLINICAL DATA:  72 year old male. End-stage renal disease. Atrial fibrillation. Hypertensive  diabetic hyperlipidemic patient with history of seizures. Brain tumor post resection (patient unsure of type of tumor and followed at [facility]). Presenting to emergency room which chills.  EXAM: MRI HEAD WITHOUT CONTRAST; MRA HEAD WITHOUT CONTRAST  TECHNIQUE: Multiplanar, multisequence MR imaging was performed. No intravenous contrast was administered.  COMPARISON:  06/19/2013 CT. No comparison MR.  FINDINGS: Exam is motion degraded.  No acute infarct.  No intracranial hemorrhage.  Prior right frontal craniotomy. Encephalomalacia right temporal lobe. No definitive findings of recurrent tumor although evaluation limited without contrast or availability of comparison prior postoperative exams.  Atrophy.  Major intracranial vascular structures are patent.  Minimal paranasal sinus mucosal thickening.  Minimal exophthalmos.  Very mild spinal stenosis C3-4.  IMPRESSION: Exam is motion degraded.  No acute infarct.  No intracranial hemorrhage.  Prior right frontal craniotomy. Encephalomalacia right temporal lobe. No definitive findings of recurrent tumor although evaluation limited without contrast or availability of comparison prior postoperative exams.  Atrophy.  MRA circle Willis:  Findings :  Exam is motion degraded.  Limited for evaluating for aneurysm or grading stenosis accurately.  Ectatic cavernous segment of the internal carotid artery bilaterally.  Ectatic distal vertical cervical segment of the right internal carotid artery.  Moderate narrowing A1 segment left anterior cerebral artery and A2 segment left anterior cerebral artery.  Moderate narrowing M1 segment right middle cerebral artery.  Middle cerebral artery moderate to marked branch vessel irregularity bilaterally.  Mild to moderate narrowing distal left vertebral artery.  Nonvisualization PICAs and left AICA.  Small right AICA.  Mild to moderate narrowing basilar artery. Evaluation limited by motion.  Moderate to marked narrowing superior cerebral  artery bilaterally.  Mild to moderate narrowing posterior cerebral artery proximally and distally with most notable stenosis P1 segment right posterior cerebral artery.  Impression :  Exam is motion degraded.  Intracranial atherosclerotic type changes as noted above.   Electronically Signed   By: Bridgett Larsson M.D.   On: 06/20/2013 09:53   Mr  Mra Head/brain Wo Cm  06/20/2013   CLINICAL DATA:  72 year old male. End-stage renal disease. Atrial fibrillation. Hypertensive diabetic hyperlipidemic patient with history of seizures. Brain tumor post resection (patient unsure of type of tumor and followed at [facility]). Presenting to emergency room which chills.  EXAM: MRI HEAD WITHOUT CONTRAST; MRA HEAD WITHOUT CONTRAST  TECHNIQUE: Multiplanar, multisequence MR imaging was performed. No intravenous contrast was administered.  COMPARISON:  06/19/2013 CT. No comparison MR.  FINDINGS: Exam is motion degraded.  No acute infarct.  No intracranial hemorrhage.  Prior right frontal craniotomy. Encephalomalacia right temporal lobe. No definitive findings of recurrent tumor although evaluation limited without contrast or availability of comparison prior postoperative exams.  Atrophy.  Major intracranial vascular structures are patent.  Minimal paranasal sinus mucosal thickening.  Minimal exophthalmos.  Very mild spinal stenosis C3-4.  IMPRESSION: Exam is motion degraded.  No acute infarct.  No intracranial hemorrhage.  Prior right frontal craniotomy. Encephalomalacia right temporal lobe. No definitive findings of recurrent tumor although evaluation limited without contrast or availability of comparison prior postoperative exams.  Atrophy.  MRA circle Willis:  Findings :  Exam is motion degraded.  Limited for evaluating for aneurysm or grading stenosis accurately.  Ectatic cavernous segment of the internal carotid artery bilaterally.  Ectatic distal vertical cervical segment of the right internal carotid artery.  Moderate  narrowing A1 segment left anterior cerebral artery and A2 segment left anterior cerebral artery.  Moderate narrowing M1 segment right middle cerebral artery.  Middle cerebral artery moderate to marked branch vessel irregularity bilaterally.  Mild to moderate narrowing distal left vertebral artery.  Nonvisualization PICAs and left AICA.  Small right AICA.  Mild to moderate narrowing basilar artery. Evaluation limited by motion.  Moderate to marked narrowing superior cerebral artery bilaterally.  Mild to moderate narrowing posterior cerebral artery proximally and distally with most notable stenosis P1 segment right posterior cerebral artery.  Impression :  Exam is motion degraded.  Intracranial atherosclerotic type changes as noted above.   Electronically Signed   By: Bridgett Larsson M.D.   On: 06/20/2013 09:53    Scheduled Meds: . aspirin EC  325 mg Oral Daily  . atorvastatin  40 mg Oral Daily  . divalproex  1,500 mg Oral Daily  . gabapentin  100 mg Oral QHS  . heparin  5,000 Units Subcutaneous Q8H  . insulin aspart  0-9 Units Subcutaneous TID WC  . metoprolol tartrate  12.5 mg Oral BID  . sevelamer carbonate  3,200 mg Oral TID WC   Continuous Infusions:   Principal Problem:   Acute CVA (cerebrovascular accident) Active Problems:   Atherosclerotic peripheral vascular disease with gangrene   ESRD (end stage renal disease) on dialysis   Paroxysmal atrial fibrillation   Seizure disorder   Refusal of blood transfusions as patient is Jehovah's Witness   Chills (without fever)    Time spent: > 35 minutes    Penny Pia  [facility] Pager [phone]. If 7PM-7AM, please contact night-coverage at [email or web address], password [facility] 06/21/2013, 12:35 PM  LOS: 2 days

## 2013-06-21 NOTE — Evaluation (Signed)
Physical Therapy Evaluation Patient Details Name: Osbaldo Mark MRN: [id number] DOB: 1940/10/17 Today's Date: 06/21/2013 Time: 4098-1191 PT Time Calculation (min): 26 min  PT Assessment / Plan / Recommendation History of Present Illness  Dayon Witt is an 72 y.o. male history of end-stage renal disease on dialysis, atrial fibrillation, hypertension, diabetes mellitus and hyperlipidemia was brought to the emergency room for evaluation of possible TIA or stroke. Patient has had intermittent episodes of shaking of his extremities on day of admission. During one of those episodes he had a 15 minute period of slurred speech which was noticed by his wife. Symptoms lasted about 15 minutes then resolved with no recurrence, even though he's had recurrent episodes of shaking of his extremities. Patient has been afebrile. CT scan of his head showed an area of encephalomalacia involving right temporal region, as well as an area of hypodensity in the left internal capsule region indicative of acute/subacute infarct. Patient has a history of tumor resection. He also has a history of seizure disorder and is currently on Depakote. He also takes Neurontin but only takes 100 mg at bedtime only. NIH stroke score was 0. Patient is not on anticoagulation and has been taking aspirin 325 mg per day.   Clinical Impression  Mr. Lienhard demonstrates some deficits in functional mobility as indicated below. Currently at Mod I with intermittent supervision. Anticipate that deficits in mobility may vary post dialysis. Spoke with patient and spouse at length regarding current levels of mobility and function as seen today vs levels of function prior to admission. At home spouse states patient requiring significant assist and often post dialysis does not have energy, desire, or motivation to get OOB for several days. Spoke with patient regarding concerns for lack of motivation despite adequate functional abilities. Patient states  that he gets depressed and doesn't do as much as he probably should. Based on conversation with patient and wife, this PT feels patient may benefit from psych consult re: depression.   At this time, with uncertainty regarding level of assist being provided as well as functional deficits experienced post dialysis, it is recommended that patient consider ALF upon discharge. Will continue to see patient acutely to facilitate dc recommendations.     PT Assessment  Patient needs continued PT services    Follow Up Recommendations  Other (comment) ( ALF for supervision/assist post dialysis days)       Barriers to Discharge Decreased caregiver support wife unable to provide support at home    Equipment Recommendations  Rolling walker with 5" wheels    Recommendations for Other Services Other (comment) (Psych consult for depression)   Frequency Min 3X/week    Precautions / Restrictions Precautions Precautions: Fall Required Braces or Orthoses: Other Brace/Splint Other Brace/Splint: Prosthetic leg Restrictions Weight Bearing Restrictions: No   Pertinent Vitals/Pain Patient reports no pain, VSS      Mobility  Bed Mobility Bed Mobility: Supine to Sit;Sitting - Scoot to Edge of Bed Supine to Sit: 6: Modified independent (Device/Increase time) Sitting - Scoot to Edge of Bed: 6: Modified independent (Device/Increase time) Details for Bed Mobility Assistance: No physical assist needed, increased time to EOB Transfers Transfers: Sit to Stand;Stand to Sit Sit to Stand: 4: Min guard Stand to Sit: 5: Supervision Details for Transfer Assistance: VCs for safe hand placement, min guard for stability Ambulation/Gait Ambulation/Gait Assistance: 5: Supervision;4: Min guard Ambulation Distance (Feet): 600 Feet Assistive device: Rolling walker;Straight cane;None (ambulated 300 with RW, 200 with cane, 100 without AD) Ambulation/Gait Assistance  Details: some instability noted towards end of ambulation  but patient able to self correct, no significant loss of balance Gait Pattern: Step-through pattern Gait velocity: WFL for community ambulation General Gait Details: some impulsivity with ambulation Stairs: Yes Stairs Assistance: 5: Supervision Stair Management Technique: Two rails;Alternating pattern;Forwards Number of Stairs: 6    Exercises     PT Diagnosis: Abnormality of gait  PT Problem List: Decreased activity tolerance;Decreased balance;Decreased mobility PT Treatment Interventions: Gait training;Stair training;Functional mobility training;Therapeutic activities;Therapeutic exercise;Balance training;Patient/family education     PT Goals(Current goals can be found in the care plan section) Acute Rehab PT Goals Patient Stated Goal: to be more active PT Goal Formulation: With patient/family Time For Goal Achievement: 07/05/13 Potential to Achieve Goals: Good  Visit Information  Last PT Received On: 06/21/13 Assistance Needed: +1 History of Present Illness: Kylo Gavin is an 72 y.o. male history of end-stage renal disease on dialysis, atrial fibrillation, hypertension, diabetes mellitus and hyperlipidemia was brought to the emergency room for evaluation of possible TIA or stroke. Patient has had intermittent episodes of shaking of his extremities on day of admission. During one of those episodes he had a 15 minute period of slurred speech which was noticed by his wife. Symptoms lasted about 15 minutes then resolved with no recurrence, even though he's had recurrent episodes of shaking of his extremities. Patient has been afebrile. CT scan of his head showed an area of encephalomalacia involving right temporal region, as well as an area of hypodensity in the left internal capsule region indicative of acute/subacute infarct. Patient has a history of tumor resection. He also has a history of seizure disorder and is currently on Depakote. He also takes Neurontin but only takes 100 mg at  bedtime only. NIH stroke score was 0. Patient is not on anticoagulation and has been taking aspirin 325 mg per day.       Prior Functioning  Home Living Family/patient expects to be discharged to:: Private residence Living Arrangements: Spouse/significant other Available Help at Discharge: Family Type of Home: House Home Access: Stairs to enter Secretary/administrator of Steps: 6 Entrance Stairs-Rails: Right;Left Home Layout: One level Home Equipment: Cane - single point;Tub bench Prior Function Level of Independence: Independent with assistive device(s) Communication Communication: No difficulties;HOH Dominant Hand: Right    Cognition  Cognition Arousal/Alertness: Awake/alert Behavior During Therapy: WFL for tasks assessed/performed Overall Cognitive Status: Within Functional Limits for tasks assessed    Extremity/Trunk Assessment Upper Extremity Assessment Upper Extremity Assessment: Overall WFL for tasks assessed Lower Extremity Assessment Lower Extremity Assessment: LLE deficits/detail LLE Deficits / Details: prior L BKA LLE Sensation: history of peripheral neuropathy   Balance High Level Balance High Level Balance Activites: Side stepping;Backward walking;Direction changes;Turns High Level Balance Comments: some instability but able to self correct  End of Session PT - End of Session Equipment Utilized During Treatment: Gait belt;Other (comment) (LLE prosthetic) Activity Tolerance: Patient tolerated treatment well Patient left: in chair;with call bell/phone within reach;with family/visitor present Nurse Communication: Mobility status  GP     Fabio Asa 06/21/2013, 1:01 PM Charlotte Crumb, PT DPT  [phone]

## 2013-06-21 NOTE — Clinical Social Work Placement (Signed)
Clinical Social Work Department CLINICAL SOCIAL WORK PLACEMENT NOTE 06/21/2013  Patient:  [name] [name],[name] [name]  Account Number:  [id number] Admit date:  06/19/2013  Clinical Social Worker:  Robin Searing  Date/time:  06/21/2013 02:33 PM  Clinical Social Work is seeking post-discharge placement for this patient at the following level of care:   ASSISTED LIVING/REST HOME   (*CSW will update this form in Epic as items are completed)   06/21/2013  Patient/family provided with [facility] Department of Clinical Social Work's list of facilities offering this level of care within the geographic area requested by the patient (or if unable, by the patient's family).  06/21/2013  Patient/family informed of their freedom to choose among providers that offer the needed level of care, that participate in Medicare, Medicaid or managed care program needed by the patient, have an available bed and are willing to accept the patient.  06/21/2013  Patient/family informed of [facility]' ownership interest in [facility], as well as of the fact that they are under no obligation to receive care at this facility.  PASARR submitted to [employer] on 06/21/2013 PASARR number received from [employer] on   FL2 transmitted to all facilities in geographic area requested by pt/family on  06/21/2013 FL2 transmitted to all facilities within larger geographic area on   Patient informed that his/her managed care company has contracts with or will negotiate with  certain facilities, including the following:     Patient/family informed of bed offers received:   Patient chooses bed at  Physician recommends and patient chooses bed at    Patient to be transferred to  on   Patient to be transferred to facility by   The following physician request were entered in Epic:   Additional Comments: Reece Levy, MSW [phone]

## 2013-06-22 LAB — CBC
HCT: 30 % — ABNORMAL LOW (ref 39.0–52.0)
Hemoglobin: 10.5 g/dL — ABNORMAL LOW (ref 13.0–17.0)
MCH: 31.5 pg (ref 26.0–34.0)
MCHC: 35 g/dL (ref 30.0–36.0)
RBC: 3.33 MIL/uL — ABNORMAL LOW (ref 4.22–5.81)
WBC: 8.5 10*3/uL (ref 4.0–10.5)

## 2013-06-22 LAB — RENAL FUNCTION PANEL
BUN: 88 mg/dL — ABNORMAL HIGH (ref 6–23)
CO2: 24 mEq/L (ref 19–32)
Calcium: 8.9 mg/dL (ref 8.4–10.5)
Chloride: 91 mEq/L — ABNORMAL LOW (ref 96–112)
GFR calc Af Amer: 4 mL/min — ABNORMAL LOW (ref >90)
GFR calc non Af Amer: 3 mL/min — ABNORMAL LOW (ref >90)
Glucose, Bld: 194 mg/dL — ABNORMAL HIGH (ref 70–99)
Potassium: 4.4 mEq/L (ref 3.5–5.1)
Sodium: 133 mEq/L — ABNORMAL LOW (ref 135–145)

## 2013-06-22 LAB — GLUCOSE, CAPILLARY
Glucose-Capillary: 120 mg/dL — ABNORMAL HIGH (ref 70–99)
Glucose-Capillary: 190 mg/dL — ABNORMAL HIGH (ref 70–99)

## 2013-06-22 LAB — URINE CULTURE

## 2013-06-22 MED ORDER — LIDOCAINE-PRILOCAINE 2.5-2.5 % EX CREA
1.0000 "application " | TOPICAL_CREAM | CUTANEOUS | Status: DC | PRN
  Filled 2013-06-22: qty 5

## 2013-06-22 MED ORDER — SODIUM CHLORIDE 0.9 % IV SOLN
62.5000 mg | INTRAVENOUS | Status: DC
  Administered 2013-06-22: 62.5 mg via INTRAVENOUS
  Filled 2013-06-22 (×2): qty 5

## 2013-06-22 MED ORDER — LIDOCAINE HCL (PF) 1 % IJ SOLN: 5.0000 mL | INTRAMUSCULAR | Status: DC | PRN

## 2013-06-22 MED ORDER — SODIUM CHLORIDE 0.9 % IV SOLN: 100.0000 mL | INTRAVENOUS | Status: DC | PRN

## 2013-06-22 MED ORDER — ALTEPLASE 2 MG IJ SOLR
2.0000 mg | Freq: Once | INTRAMUSCULAR | Status: DC | PRN
  Filled 2013-06-22: qty 2

## 2013-06-22 MED ORDER — AMPICILLIN 500 MG PO CAPS
500.0000 mg | ORAL_CAPSULE | Freq: Two times a day (BID) | ORAL | Status: DC
  Administered 2013-06-22 – 2013-06-23 (×2): 500 mg via ORAL
  Filled 2013-06-22 (×4): qty 1

## 2013-06-22 MED ORDER — HEPARIN SODIUM (PORCINE) 1000 UNIT/ML DIALYSIS
1000.0000 [IU] | INTRAMUSCULAR | Status: DC | PRN
  Filled 2013-06-22: qty 1

## 2013-06-22 MED ORDER — PENTAFLUOROPROP-TETRAFLUOROETH EX AERO: 1.0000 "application " | INHALATION_SPRAY | CUTANEOUS | Status: DC | PRN

## 2013-06-22 MED ORDER — DOXERCALCIFEROL 4 MCG/2ML IV SOLN
INTRAVENOUS | Status: AC
  Filled 2013-06-22: qty 2

## 2013-06-22 MED ORDER — CEPHALEXIN 500 MG PO CAPS
500.0000 mg | ORAL_CAPSULE | Freq: Two times a day (BID) | ORAL | Status: DC
  Filled 2013-06-22 (×2): qty 1

## 2013-06-22 MED ORDER — NEPRO/CARBSTEADY PO LIQD
237.0000 mL | ORAL | Status: DC | PRN
  Filled 2013-06-22: qty 237

## 2013-06-22 MED ORDER — ESCITALOPRAM OXALATE 5 MG PO TABS
5.0000 mg | ORAL_TABLET | Freq: Every day | ORAL | Status: DC
  Administered 2013-06-23: 5 mg via ORAL
  Filled 2013-06-22 (×2): qty 1

## 2013-06-22 MED ORDER — DOXERCALCIFEROL 4 MCG/2ML IV SOLN
4.0000 ug | INTRAVENOUS | Status: DC
  Administered 2013-06-22: 4 ug via INTRAVENOUS
  Filled 2013-06-22: qty 2

## 2013-06-22 MED ORDER — TUBERCULIN PPD 5 UNIT/0.1ML ID SOLN: 5.0000 [IU] | Freq: Once | INTRADERMAL | Status: DC

## 2013-06-22 NOTE — Procedures (Signed)
I was present at this dialysis session. I have reviewed the session itself and made appropriate changes.   [name] Moselle  MD Pager [phone]    Cell  [phone] 06/22/2013, 4:08 PM

## 2013-06-22 NOTE — Progress Notes (Signed)
Spoke with [facility] which is looking to take patient for ALF care and they will come tomorrow to further assess patient for appropriateness for their community- patient agreeable to this plan and this setting- will advise after their visit tomorrow- Reece Levy, MSW [phone]

## 2013-06-22 NOTE — Progress Notes (Addendum)
[facility] PROGRESS NOTE  [name] [name] AVW:[id number] DOB: September 12, 1941 DOA: 06/19/2013 PCP: [name], MD  Brief Narrative: 72 y/o with h/o ESRD with dialysis on (M W F) per patient that presented with complaints of chills and slurred speech.  Patient underwent Stroke/TIA work up. Negative Stroke work up and patient afebrile and with no source of active infection.  Patient's wife concerned patient will require more care at home than she is able to provide and as such patient currently awaiting PT evaluation and recommendations and possible placement into ALF.  Assessment/Plan: 1. Transient slurred speech; Resolved -work up for stroke/tia which was negative, including and EEG. Neurology recommends the following: 1. Continue Depakote ER 1500 mg daily - would have trough level checked at HD on Friday  2. 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 Follow PT recs  4. VTE ppx - heparin TID  5. Stable from neurology point of view, neuro to sign off - Patient should f/u with Dr. Marjory Lies (scheduled for Nov)  Started discharge planning and currently awaiting PT's recommendations.  Patient inquiring about ALF and social worker currently assessing case.  2. UTI; +Urine culture has grown > 100,000 CFU of enterococcus; ? Colonization vs  Infection; afebrile; no leukocytosis  -start PO ampicillin;   3. ESRD on HD - Nephrology consulted for HD today 06/22/13  4. Seizure d/o - Currently on Depakote  5. PVF - on aspirin and statin  6. PAF - on Metoprolol, will plan on continuing. Currently rate controlled  7. DM - Will place on SSI (S/Renal) - continue to monitor blood sugars.  8. Depression; no suicidal ideation or plans; start lexapro   Code Status: full Family Communication: Discussed with patient and wife at bedside Disposition Plan: Currently awaiting placement    Consultants:  Neurology  Physical  therapy  Procedures:  none  Antibiotics:  None  HPI/Subjective: Patient denies any slurred speech. Patient had no complaints of increased sadness or anhedonia to me today.   Objective: Filed Vitals:   06/22/13 0937  BP: 133/40  Pulse: 62  Temp: 98 F (36.7 C)  Resp: 18    Intake/Output Summary (Last 24 hours) at 06/22/13 1021 Last data filed at 06/22/13 0655  Gross per 24 hour  Intake    440 ml  Output    350 ml  Net     90 ml   Filed Weights   06/19/13 1705 06/19/13 2127  Weight: 114.4 kg (252 lb 3.3 oz) 113.807 kg (250 lb 14.4 oz)    Exam:   General:  Pt in NAD, Alert and Awake  Cardiovascular: normal s1 and s2, no murmurs  Respiratory: CTA BL, no wheezes  Abdomen: soft, NT, ND  Musculoskeletal: warm and dry   Data Reviewed: Basic Metabolic Panel:  Recent Labs Lab 06/19/13 1807 06/19/13 2340  NA 135  --   K 4.6  --   CL 88*  --   CO2 27  --   GLUCOSE 228*  --   BUN 59*  --   CREATININE 10.33* 10.63*  CALCIUM 9.8  --    Liver Function Tests: No results found for this basename: AST, ALT, ALKPHOS, BILITOT, PROT, ALBUMIN,  in the last 168 hours No results found for this basename: LIPASE, AMYLASE,  in the last 168 hours No results found for this basename: AMMONIA,  in the last 168 hours CBC:  Recent Labs Lab 06/19/13 1807  06/19/13 2340  WBC 9.9 10.5  HGB 12.4* 11.6*  HCT 35.4* 33.3*  MCV 91.2 90.7  PLT 284 222   Cardiac Enzymes: No results found for this basename: CKTOTAL, CKMB, CKMBINDEX, TROPONINI,  in the last 168 hours BNP (last 3 results)  Recent Labs  05/26/13 1700  PROBNP 699.3*   CBG:  Recent Labs Lab 06/21/13 0702 06/21/13 1128 06/21/13 1642 06/21/13 2158 06/22/13 0654  GLUCAP 126* 125* 124* 117* 120*    Recent Results (from the past 240 hour(s))  CULTURE, BLOOD (ROUTINE X 2)     Status: None   Collection Time    06/20/13  1:30 AM      Result Value Range Status   Specimen Description BLOOD RIGHT ARM    Final   Special Requests BOTTLES DRAWN AEROBIC ONLY 5CC   Final   Culture  Setup Time     Final   Value: 06/20/2013 09:29     Performed at [facility]   Culture     Final   Value:        BLOOD CULTURE RECEIVED NO GROWTH TO DATE CULTURE WILL BE HELD FOR 5 DAYS BEFORE ISSUING A FINAL NEGATIVE REPORT     Performed at [facility]   Report Status PENDING   Incomplete  CULTURE, BLOOD (ROUTINE X 2)     Status: None   Collection Time    06/20/13  1:40 AM      Result Value Range Status   Specimen Description BLOOD RIGHT ARM   Final   Special Requests BOTTLES DRAWN AEROBIC ONLY 10CC   Final   Culture  Setup Time     Final   Value: 06/20/2013 09:28     Performed at [facility]   Culture     Final   Value:        BLOOD CULTURE RECEIVED NO GROWTH TO DATE CULTURE WILL BE HELD FOR 5 DAYS BEFORE ISSUING A FINAL NEGATIVE REPORT     Performed at [facility]   Report Status PENDING   Incomplete  URINE CULTURE     Status: None   Collection Time    06/20/13  3:01 PM      Result Value Range Status   Specimen Description URINE, CLEAN CATCH   Final   Special Requests NONE   Final   Culture  Setup Time     Final   Value: 06/19/2013 16:05     Performed at [facility] Count     Final   Value: >=100,000 COLONIES/ML     Performed at [facility]   Culture     Final   Value: ENTEROCOCCUS SPECIES     Performed at [facility]   Report Status 06/22/2013 FINAL   Final   Organism ID, Bacteria ENTEROCOCCUS SPECIES   Final     Studies: No results found.  Scheduled Meds: . aspirin EC  325 mg Oral Daily  . atorvastatin  40 mg Oral Daily  . divalproex  1,500 mg Oral Daily  . doxercalciferol  4 mcg Intravenous Q M,W,F-HD  . ferric gluconate (FERRLECIT/NULECIT) IV  62.5 mg Intravenous Weekly  . gabapentin  100 mg Oral QHS  . heparin  5,000 Units Subcutaneous Q8H  . insulin aspart  0-9 Units Subcutaneous TID WC  . metoprolol tartrate   12.5 mg Oral BID  . sevelamer carbonate  3,200 mg Oral TID WC  . tuberculin  5  Units Intradermal Once  . tuberculin  5 Units Intradermal Once   Continuous Infusions:   Principal Problem:   Acute CVA (cerebrovascular accident) Active Problems:   Atherosclerotic peripheral vascular disease with gangrene   ESRD (end stage renal disease) on dialysis   Paroxysmal atrial fibrillation   Seizure disorder   Refusal of blood transfusions as patient is Jehovah's Witness   Chills (without fever)   UTI (urinary tract infection)    Time spent: > 35 minutes    Esperanza Sheets  [facility] Pager [phone]. If 7PM-7AM, please contact night-coverage at [email or web address], password [facility] 06/22/2013, 10:21 AM  LOS: 3 days

## 2013-06-22 NOTE — Consult Note (Signed)
Indication for Consultation:  Management of ESRD/hemodialysis; anemia, hypertension/volume and secondary hyperparathyroidism  HPI: [name] [name] is a 72 y.o. male who presented to the ED on Sunday with shaking and slurred speech and was admitted for a stroke workup.  He recieves HD MWF @ [facility] and has a history of HTN, afib, DM and seizures. Pt states he was at home Sunday afternoon and the symptoms came on suddenly. His wife noticed his speech slurring and shaking and at this time he also reports having a slight headache but not other symptoms. In the ED head CT showed no acute hemorrhage but there was an area of encephalomalacia in the inferior right temporal lobe, as well as an area of hypodensity in the left internal capsule  indicative of acute/subacute ischemia. MRI, no acute infarct. ECHO showed EF 55-60%. Currently, he states he is feeling much better and symptoms have resolved.     Past Medical History  Diagnosis Date  . Dialysis patient   . Peripheral vascular disease   . ESRD (end stage renal disease) on dialysis 09/2010    MWF  . Hypertension   . Hyperlipidemia   . Arthritis     Gout  . Paroxysmal atrial fibrillation   . Brain tumor 2009  . Seizure disorder 2009  . Anemia   . Hyperparathyroidism, secondary   . Diabetes mellitus without complication     Type II, diet controlled  . Proteus mirabilis infection   . CHF (congestive heart failure)     grade 1 DD, preserved EF, [facility] records  . CAD (coronary artery disease)     reports that last stress test was 1-2 years ago at [facility] and was negative, but have not found these records.    . 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 brain tumor  . GERD (gastroesophageal reflux disease)    Past Surgical History  Procedure Laterality Date  . Av fistula placement Left     arm  . 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 Eye  surgery Bilateral 2000    cataract  . Retinopathy surgery      Hx. of laser treatments for diabetics   Family History  Problem Relation Age of Onset  . Seizures Mother   . Stroke Mother 1  . Hypertension Mother   . Alcohol abuse Father   . Hypertension Father   . Arthritis Sister   . Heart attack Neg Hx   . Heart failure Neg Hx    Social History:  Lives at home with his wife.   reports that he has never smoked. He has never used smokeless tobacco. He reports that he drinks about 0.6 ounces of alcohol per week. He reports that he does not use illicit drugs. No Known Allergies Prior to Admission medications   Medication Sig Start Date End Date Taking? Authorizing Provider  aspirin EC 325 MG tablet Take 325 mg by mouth daily.   Yes Historical Provider, MD  atorvastatin (LIPITOR) 40 MG tablet Take 1 tablet (40 mg total) by mouth daily. 05/28/13  Yes Rhetta Mura, MD  divalproex (DEPAKOTE ER) 500 MG 24 hr tablet Take 1,000 mg by mouth daily.   Yes Historical Provider, MD  gabapentin (NEURONTIN) 100 MG capsule Take 1 capsule by mouth at bedtime. 01/21/13  Yes Historical Provider, MD  metoprolol tartrate (LOPRESSOR) 12.5 mg TABS tablet Take 0.5 tablets (12.5  mg total) by mouth 2 (two) times daily. 05/28/13  Yes Rhetta Mura, MD  Multiple Vitamins-Minerals (PRORENAL QD) CAPS Take 1 capsule by mouth daily.   Yes Historical Provider, MD  nitroGLYCERIN (NITROSTAT) 0.4 MG SL tablet Place 1 tablet (0.4 mg total) under the tongue every 5 (five) minutes x 3 doses as needed for chest pain. 05/28/13  Yes Rhetta Mura, MD  omeprazole (PRILOSEC OTC) 20 MG tablet Take 20 mg by mouth daily as needed (for heartburn).   Yes Historical Provider, MD  sevelamer carbonate (RENVELA) 800 MG tablet Take 3,200 mg by mouth 3 (three) times daily with meals.   Yes Historical Provider, MD   Current Facility-Administered Medications  Medication Dose Route Frequency Provider Last Rate Last Dose  .  aspirin EC tablet 325 mg  325 mg Oral Daily Houston Siren, MD   325 mg at 06/21/13 1023  . atorvastatin (LIPITOR) tablet 40 mg  40 mg Oral Daily Houston Siren, MD   40 mg at 06/21/13 1023  . divalproex (DEPAKOTE ER) 24 hr tablet 1,500 mg  1,500 mg Oral Daily Belia Heman, MD   1,500 mg at 06/21/13 1023  . gabapentin (NEURONTIN) capsule 100 mg  100 mg Oral QHS Houston Siren, MD   100 mg at 06/21/13 2138  . heparin injection 5,000 Units  5,000 Units Subcutaneous Q8H Houston Siren, MD   5,000 Units at 06/22/13 0534  . insulin aspart (novoLOG) injection 0-9 Units  0-9 Units Subcutaneous TID WC Penny Pia, MD   1 Units at 06/21/13 1718  . metoprolol tartrate (LOPRESSOR) tablet 12.5 mg  12.5 mg Oral BID Houston Siren, MD   12.5 mg at 06/21/13 2138  . pantoprazole (PROTONIX) EC tablet 40 mg  40 mg Oral Daily PRN Houston Siren, MD      . sevelamer carbonate (RENVELA) tablet 3,200 mg  3,200 mg Oral TID WC Houston Siren, MD   3,200 mg at 06/21/13 1718  . tuberculin injection 5 Units  5 Units Intradermal Once Penny Pia, MD   5 Units at 06/21/13 2034   Labs: Basic Metabolic Panel:  Recent Labs Lab 06/19/13 1807 06/19/13 2340  NA 135  --   K 4.6  --   CL 88*  --   CO2 27  --   GLUCOSE 228*  --   BUN 59*  --   CREATININE 10.33* 10.63*  CALCIUM 9.8  --    Liver Function Tests: No results found for this basename: AST, ALT, ALKPHOS, BILITOT, PROT, ALBUMIN,  in the last 168 hours No results found for this basename: LIPASE, AMYLASE,  in the last 168 hours No results found for this basename: AMMONIA,  in the last 168 hours CBC:  Recent Labs Lab 06/19/13 1807 06/19/13 2340  WBC 9.9 10.5  HGB 12.4* 11.6*  HCT 35.4* 33.3*  MCV 91.2 90.7  PLT 284 222   Cardiac Enzymes: No results found for this basename: CKTOTAL, CKMB, CKMBINDEX, TROPONINI,  in the last 168 hours CBG:  Recent Labs Lab 06/21/13 0702 06/21/13 1128 06/21/13 1642 06/21/13 2158 06/22/13 0654  GLUCAP 126* 125* 124* 117* 120*   Iron Studies: No results  found for this basename: IRON, TIBC, TRANSFERRIN, FERRITIN,  in the last 72 hours Studies/Results: Mri Brain Without Contrast  06/20/2013   CLINICAL DATA:  72 year old male. End-stage renal disease. Atrial fibrillation. Hypertensive diabetic hyperlipidemic patient with history of seizures. Brain tumor post resection (patient unsure of type of tumor and followed at [facility]). Presenting  to emergency room which chills.  EXAM: MRI HEAD WITHOUT CONTRAST; MRA HEAD WITHOUT CONTRAST  TECHNIQUE: Multiplanar, multisequence MR imaging was performed. No intravenous contrast was administered.  COMPARISON:  06/19/2013 CT. No comparison MR.  FINDINGS: Exam is motion degraded.  No acute infarct.  No intracranial hemorrhage.  Prior right frontal craniotomy. Encephalomalacia right temporal lobe. No definitive findings of recurrent tumor although evaluation limited without contrast or availability of comparison prior postoperative exams.  Atrophy.  Major intracranial vascular structures are patent.  Minimal paranasal sinus mucosal thickening.  Minimal exophthalmos.  Very mild spinal stenosis C3-4.  IMPRESSION: Exam is motion degraded.  No acute infarct.  No intracranial hemorrhage.  Prior right frontal craniotomy. Encephalomalacia right temporal lobe. No definitive findings of recurrent tumor although evaluation limited without contrast or availability of comparison prior postoperative exams.  Atrophy.  MRA circle Willis:  Findings :  Exam is motion degraded.  Limited for evaluating for aneurysm or grading stenosis accurately.  Ectatic cavernous segment of the internal carotid artery bilaterally.  Ectatic distal vertical cervical segment of the right internal carotid artery.  Moderate narrowing A1 segment left anterior cerebral artery and A2 segment left anterior cerebral artery.  Moderate narrowing M1 segment right middle cerebral artery.  Middle cerebral artery moderate to marked branch vessel irregularity bilaterally.   Mild to moderate narrowing distal left vertebral artery.  Nonvisualization PICAs and left AICA.  Small right AICA.  Mild to moderate narrowing basilar artery. Evaluation limited by motion.  Moderate to marked narrowing superior cerebral artery bilaterally.  Mild to moderate narrowing posterior cerebral artery proximally and distally with most notable stenosis P1 segment right posterior cerebral artery.  Impression :  Exam is motion degraded.  Intracranial atherosclerotic type changes as noted above.   Electronically Signed   By: Bridgett Larsson M.D.   On: 06/20/2013 09:53   Mr Maxine Glenn Head/brain Wo Cm  06/20/2013   CLINICAL DATA:  72 year old male. End-stage renal disease. Atrial fibrillation. Hypertensive diabetic hyperlipidemic patient with history of seizures. Brain tumor post resection (patient unsure of type of tumor and followed at [facility]). Presenting to emergency room which chills.  EXAM: MRI HEAD WITHOUT CONTRAST; MRA HEAD WITHOUT CONTRAST  TECHNIQUE: Multiplanar, multisequence MR imaging was performed. No intravenous contrast was administered.  COMPARISON:  06/19/2013 CT. No comparison MR.  FINDINGS: Exam is motion degraded.  No acute infarct.  No intracranial hemorrhage.  Prior right frontal craniotomy. Encephalomalacia right temporal lobe. No definitive findings of recurrent tumor although evaluation limited without contrast or availability of comparison prior postoperative exams.  Atrophy.  Major intracranial vascular structures are patent.  Minimal paranasal sinus mucosal thickening.  Minimal exophthalmos.  Very mild spinal stenosis C3-4.  IMPRESSION: Exam is motion degraded.  No acute infarct.  No intracranial hemorrhage.  Prior right frontal craniotomy. Encephalomalacia right temporal lobe. No definitive findings of recurrent tumor although evaluation limited without contrast or availability of comparison prior postoperative exams.  Atrophy.  MRA circle Willis:  Findings :  Exam is motion  degraded.  Limited for evaluating for aneurysm or grading stenosis accurately.  Ectatic cavernous segment of the internal carotid artery bilaterally.  Ectatic distal vertical cervical segment of the right internal carotid artery.  Moderate narrowing A1 segment left anterior cerebral artery and A2 segment left anterior cerebral artery.  Moderate narrowing M1 segment right middle cerebral artery.  Middle cerebral artery moderate to marked branch vessel irregularity bilaterally.  Mild to moderate narrowing distal left vertebral artery.  Nonvisualization PICAs and  left AICA.  Small right AICA.  Mild to moderate narrowing basilar artery. Evaluation limited by motion.  Moderate to marked narrowing superior cerebral artery bilaterally.  Mild to moderate narrowing posterior cerebral artery proximally and distally with most notable stenosis P1 segment right posterior cerebral artery.  Impression :  Exam is motion degraded.  Intracranial atherosclerotic type changes as noted above.   Electronically Signed   By: Bridgett Larsson M.D.   On: 06/20/2013 09:53     Review of Systems: Gen: Reports chills in the ED but not since. Denies  anorexia, fatigue, weakness,  HEENT: No visual complaints Normal external appearance  CV: Denies chest pain, angina, palpitations, syncope, orthopnea, PND, peripheral edema, and claudication. Resp: Denies dyspnea at rest, dyspnea with exercise, cough, sputum, wheezing,  GI: Denies nausea/vomitting and abdominal pain. GU : States has a UTI but Denies urinary burning, blood in urine, urinary frequency, urinary hesitancy. Voids once a day MS: Denies joint pain, limitation of movement, and swelling,  low back pain, extremity pain. Denies muscle weakness Psych: Denies depression, anxiety, memory loss,  and confusion. Heme: Denies bruising, bleeding, and enlarged lymph nodes. Neuro:  Headache and slurred speech resolved. No diplopia. No dysarthria.   . No paresthesias.  No weakness. Endocrine-  DM.  Physical Exam: Filed Vitals:   06/21/13 1851 06/21/13 2154 06/22/13 0223 06/22/13 0637  BP: 118/61 139/70 126/65 127/65  Pulse: 63 59 62 61  Temp: 97.7 F (36.5 C) 97.6 F (36.4 C) 97.9 F (36.6 C) 98 F (36.7 C)  TempSrc: Oral Oral Oral Oral  Resp: 20 18 18 18   Height:      Weight:      SpO2: 100% 98% 100% 100%     General: Well developed, well nourished, in no acute distress.  Head: Normocephalic, atraumatic, sclera non-icteric, mucus membranes are moist Neck: Supple. JVD not elevated. No carotid bruits Lungs: Clear, dim bases. bilaterally to auscultation without wheezes, rales, or rhonchi. Breathing is unlabored. Heart: RRR with S1 S2. No murmurs, rubs, or gallops appreciated. Abdomen: Soft, non-tender, non-distended with normoactive bowel sounds. No rebound/guarding. No obvious abdominal masses. M-S:  Strength and tone appear normal for age. Lower extremities:, LBKA, RLE trace edema Neuro: Alert and oriented X 3. Moves all extremities spontaneously. Speech clear. Follows commands Psych:  Responds to questions appropriately with a normal affect. Dialysis Access: LUA AVF + bruit/thrill  [facility]: [facility] 30 [facility]   edw 112.5    2k/2ca+ 8,000 Heparin LUA AVF 400/800    hectorol  mcg IV/HD Epogen 1200 Units/week  Venofer 50 mg/week Prosthetic- 2.5 kg   Assessment/Plan: 1.  Slurred speech.- symptoms resolved. Negative stroke workup. tox screen negative.  CT/MRI- no acute infarct/ hemorrhage, Echo- EF 55-60%. Carotid doppler 1-39% stenosis involving R and L internal carotid artery. Neuro has signed off. 2.  ESRD -  MWF [facility]. HD pending for today. K+ 4.6 3.  Hypertension/volume  - 127/65.  4kg over edw 4.  Anemia  - hbg 11.6. On epo and venofer out pt. Last tsat 33%- cont iron 5.  Metabolic bone disease -  Ca+ 9.8  Phos 6.5. renvela with meals. Hectorol. Last pth in july 473 6.  Nutrition - albumin 4.0 renal diet. 7. Afib- rate controlled on metoprolol.  tele 8. UTI- enterococcus. On vanc per pharmacy 9. Seizure disorder- cont depakote per primary  Jetty Duhamel, NP [facility] [phone] 06/22/2013, 9:02 AM   Patient seen and examined.  Agree with assessment and plan as above.  ESRD patient admitted with spell of shaking extremities and slurred speech.  Work up was neg for CVA and neuro has signed off.  He has underlying seizure d/o on Depakote. Urine cx grew enterococcus and he is on po ampicillin. PT has seen pt and plan is for d/c home or to ALF, not sure which.  Pt was in hospital Monday and missed outpt Rx that day.  Plan is for HD today.  Rec's as above.  Vinson Moselle  MD Pager [phone]    Cell  [phone] 06/22/2013, 4:04 PM

## 2013-06-22 NOTE — Progress Notes (Signed)
Physical Therapy Treatment Patient Details Name: [name] [name] MRN: [id number] DOB: 04-Feb-1941 Today's Date: 06/22/2013 Time: 4098-1191 PT Time Calculation (min): 40 min  PT Assessment / Plan / Recommendation  History of Present Illness [name] [name] is an 72 y.o. male history of end-stage renal disease on dialysis, atrial fibrillation, hypertension, diabetes mellitus and hyperlipidemia was brought to the emergency room for evaluation of possible TIA or stroke. Patient has had intermittent episodes of shaking of his extremities on day of admission. During one of those episodes he had a 15 minute period of slurred speech which was noticed by his wife. Symptoms lasted about 15 minutes then resolved with no recurrence, even though he's had recurrent episodes of shaking of his extremities. Patient has been afebrile. CT scan of his head showed an area of encephalomalacia involving right temporal region, as well as an area of hypodensity in the left internal capsule region indicative of acute/subacute infarct. Patient has a history of tumor resection. He also has a history of seizure disorder and is currently on Depakote. He also takes Neurontin but only takes 100 mg at bedtime only. NIH stroke score was 0. Patient is not on anticoagulation and has been taking aspirin 325 mg per day.   PT Comments   Patient activity limited to minimal ambulation as well as self care and hygiene today. Ambulated to bathroom, performed hygiene and bathing as patient was incontinent and soiled.  Patient able to perform many of the tasks with min guard/supervision and encouragement. Spoke with patient regarding concerns and outcome of conversation with MD related to patient lack of motivation and depression. Patient appreciative. Will continue to see patient and progress activity as tolerated.    Follow Up Recommendations  Other (comment) ( ALF for supervision/assist post dialysis days)           Equipment  Recommendations  Rolling walker with 5" wheels    Recommendations for Other Services Other (comment) (Psych consult for depression)  Frequency Min 3X/week   Progress towards PT Goals Progress towards PT goals: Progressing toward goals  Plan Current plan remains appropriate    Precautions / Restrictions Precautions Precautions: Fall Required Braces or Orthoses: Other Brace/Splint Other Brace/Splint: Prosthetic leg Restrictions Weight Bearing Restrictions: No   Pertinent Vitals/Pain Pt reports no pain     Mobility  Bed Mobility Bed Mobility: Supine to Sit;Sitting - Scoot to Edge of Bed Supine to Sit: 6: Modified independent (Device/Increase time) Sitting - Scoot to Edge of Bed: 6: Modified independent (Device/Increase time) Details for Bed Mobility Assistance: No physical assist needed, increased time to EOB Transfers Transfers: Sit to Stand;Stand to Sit Sit to Stand: 5: Supervision;4: Min guard;From bed;From chair/3-in-1 Stand to Sit: 5: Supervision;To chair/3-in-1 Details for Transfer Assistance: VCs for safe hand placement, min guard for stability Ambulation/Gait Ambulation/Gait Assistance: 5: Supervision;4: Min guard Ambulation Distance (Feet): 20 Feet Assistive device: Straight cane Ambulation/Gait Assistance Details: some increased instability secondary to fatigue from ADL activities Gait Pattern: Step-through pattern Gait velocity: WFL for community ambulation General Gait Details: some instability noted secondary to fatigue Stairs: No      PT Goals (current goals can now be found in the care plan section) Acute Rehab PT Goals Patient Stated Goal: to be more active PT Goal Formulation: With patient/family Time For Goal Achievement: 07/05/13 Potential to Achieve Goals: Good  Visit Information  Last PT Received On: 06/22/13 Assistance Needed: +1 History of Present Illness: [name] [name] is an 72 y.o. male history of end-stage renal disease on  dialysis, atrial  fibrillation, hypertension, diabetes mellitus and hyperlipidemia was brought to the emergency room for evaluation of possible TIA or stroke. Patient has had intermittent episodes of shaking of his extremities on day of admission. During one of those episodes he had a 15 minute period of slurred speech which was noticed by his wife. Symptoms lasted about 15 minutes then resolved with no recurrence, even though he's had recurrent episodes of shaking of his extremities. Patient has been afebrile. CT scan of his head showed an area of encephalomalacia involving right temporal region, as well as an area of hypodensity in the left internal capsule region indicative of acute/subacute infarct. Patient has a history of tumor resection. He also has a history of seizure disorder and is currently on Depakote. He also takes Neurontin but only takes 100 mg at bedtime only. NIH stroke score was 0. Patient is not on anticoagulation and has been taking aspirin 325 mg per day.    Subjective Data  Subjective: I slept ok Patient Stated Goal: to be more active   Cognition  Cognition Arousal/Alertness: Awake/alert Behavior During Therapy: WFL for tasks assessed/performed Overall Cognitive Status: Within Functional Limits for tasks assessed    Balance  Static Sitting Balance Static Sitting - Balance Support: Feet supported Static Sitting - Level of Assistance: 7: Independent Dynamic Sitting Balance Dynamic Sitting - Balance Support: During functional activity Dynamic Sitting - Level of Assistance: 7: Independent Dynamic Sitting - Balance Activities: Lateral lean/weight shifting;Forward lean/weight shifting;Trunk control activities Dynamic Standing Balance Dynamic Standing - Balance Support: During functional activity Dynamic Standing - Level of Assistance: 5: Stand by assistance Dynamic Standing - Balance Activities: Lateral lean/weight shifting;Forward lean/weight shifting;Reaching across midline Dynamic Standing  - Comments: perform hygiene and bathing  High Level Balance High Level Balance Activites: Side stepping;Backward walking;Direction changes;Turns High Level Balance Comments: some instability but able to self correct  End of Session PT - End of Session Equipment Utilized During Treatment: Gait belt;Other (comment) (LLE prosthetic) Activity Tolerance: Patient tolerated treatment well Patient left: in chair;with call bell/phone within reach Nurse Communication: Mobility status;Other (comment) (bathing and hygiene to be addressed)   GP     Fabio Asa 06/22/2013, 10:59 AM Charlotte Crumb, PT DPT  [phone]

## 2013-06-23 ENCOUNTER — Encounter (HOSPITAL_COMMUNITY): Payer: Medicare Other

## 2013-06-23 DIAGNOSIS — R4789 Other speech disturbances: Principal | ICD-10-CM

## 2013-06-23 LAB — GLUCOSE, CAPILLARY: Glucose-Capillary: 122 mg/dL — ABNORMAL HIGH (ref 70–99)

## 2013-06-23 MED ORDER — GENTAMICIN SULFATE 40 MG/ML IJ SOLN
1.0000 mg/kg | INTRAMUSCULAR | Status: DC | PRN
  Filled 2013-06-23: qty 2.75

## 2013-06-23 MED ORDER — ESCITALOPRAM OXALATE 5 MG PO TABS: 5.0000 mg | ORAL_TABLET | Freq: Every day | ORAL | Status: DC

## 2013-06-23 MED ORDER — DIVALPROEX SODIUM ER 500 MG PO TB24: 1500.0000 mg | ORAL_TABLET | Freq: Every day | ORAL | Status: DC

## 2013-06-23 MED ORDER — GENTAMICIN SULFATE 40 MG/ML IJ SOLN: 1.0000 mg/kg | INTRAVENOUS | Status: DC | PRN

## 2013-06-23 MED ORDER — DEXTROSE 5 % IV SOLN
2.0000 mg/kg | Freq: Once | INTRAVENOUS | Status: DC
  Filled 2013-06-23: qty 5.75

## 2013-06-23 MED ORDER — AMPICILLIN 500 MG PO CAPS: 500.0000 mg | ORAL_CAPSULE | Freq: Two times a day (BID) | ORAL | Status: DC

## 2013-06-23 NOTE — Progress Notes (Signed)
[facility] has accepted patient but cannot take him until the [employer] Medicaid is in place- patient's wife has gone down to DSS to clear this up- awaiting final word for hopeful transfer today.  Reece Levy, MSW [phone]

## 2013-06-23 NOTE — Progress Notes (Signed)
  [facility] Progress Note   Subjective: feeling good, no compliants  Physical Exam:  Blood pressure 114/60, pulse 71, temperature 97.4 F (36.3 C), temperature source Oral, resp. rate 18, height 6\' 4"  (1.93 m), weight 113.2 kg (249 lb 9 oz), SpO2 100.00%. Exam: Gen- alert, no distress, calm , laughing Neck- no jvd Chest- clear bilat to bases Cor- reg, no M or rub Abd- soft, NT, ND Ext- no LE edema Access- LUA fistula patent  [facility] Orders: [facility]  4.5hrs   112.5kg    Bath 2k/2Ca  Heparin 8000     LUA AVF  Hectorol mcg      Epogen 1200    Venofer 50 mg/week  Prosthetic- 2.5 kg  Assessment/Plan:  1. Slurred speech.- symptoms resolved. Negative stroke workup, possible TIA. For d/c to ALF possibly later today 2. ESRD - MWF HD 3. Hypertension/volume - 127/65. 4kg over edw 4. Anemia - hbg 11.6. On epo and venofer out pt. Last tsat 33%- cont iron 5. Metabolic bone disease - Ca+ 9.8 Phos 6.5. renvela with meals. Hectorol. Last pth in july 473 6. Nutrition - albumin 4.0 renal diet. 7. Afib- rate controlled on metoprolol 8. UTI- enterococcus, on po amp, would recommend additional IV gent with HD for a week, have d/w primary who is in agreement, will order loading dose today then after HD x 3 9. Seizure disorder- cont depakote per primary   Vinson Moselle  MD Pager [phone]    Cell  [phone] 06/23/2013, 11:52 AM    Recent Labs Lab 06/19/13 1807 06/19/13 2340 06/22/13 1341  NA 135  --  133*  K 4.6  --  4.4  CL 88*  --  91*  CO2 27  --  24  GLUCOSE 228*  --  194*  BUN 59*  --  88*  CREATININE 10.33* 10.63* 13.86*  CALCIUM 9.8  --  8.9  PHOS  --   --  7.2*    Recent Labs Lab 06/22/13 1341  ALBUMIN 3.1*    Recent Labs Lab 06/19/13 1807 06/19/13 2340 06/22/13 1341  WBC 9.9 10.5 8.5  HGB 12.4* 11.6* 10.5*  HCT 35.4* 33.3* 30.0*  MCV 91.2 90.7 90.1  PLT 284 222 270   . ampicillin  500 mg Oral Q12H  . aspirin EC  325 mg Oral Daily  .  atorvastatin  40 mg Oral Daily  . divalproex  1,500 mg Oral Daily  . doxercalciferol  4 mcg Intravenous Q M,W,F-HD  . escitalopram  5 mg Oral Daily  . ferric gluconate (FERRLECIT/NULECIT) IV  62.5 mg Intravenous Weekly  . gabapentin  100 mg Oral QHS  . heparin  5,000 Units Subcutaneous Q8H  . insulin aspart  0-9 Units Subcutaneous TID WC  . metoprolol tartrate  12.5 mg Oral BID  . sevelamer carbonate  3,200 mg Oral TID WC  . tuberculin  5 Units Intradermal Once  . tuberculin  5 Units Intradermal Once     pantoprazole

## 2013-06-23 NOTE — Discharge Summary (Addendum)
Physician Discharge Summary  [name] [name] ZOX:[id number] DOB: 03-18-1941 DOA: 06/19/2013  PCP: [name], MD  Admit date: 06/19/2013 Discharge date: 06/23/2013  Time spent: >35 minutes  Recommendations for Outpatient Follow-up:  F/u with HD as scheduled  F/u with PCP in 1-2 weeks as needed   Discharge Diagnoses:  Principal Problem:   Slurred speech Active Problems: UTI   Atherosclerotic peripheral vascular disease with gangrene   ESRD (end stage renal disease) on dialysis   Paroxysmal atrial fibrillation   Seizure disorder   Refusal of blood transfusions as patient is Jehovah's Witness   Chills (without fever)   UTI (urinary tract infection)   Discharge Condition: stable   Diet recommendation: renal   Filed Weights   06/19/13 2127 06/22/13 1250 06/22/13 1746  Weight: 113.807 kg (250 lb 14.4 oz) 117.2 kg (258 lb 6.1 oz) 113.2 kg (249 lb 9 oz)    History of present illness:  Brief Narrative:  72 y/o with h/o ESRD with dialysis on (M W F) per patient that presented with complaints of chills and slurred speech. Patient underwent Stroke/TIA work up. Negative Stroke work up and patient afebrile and with no source of active infection. Patient's wife concerned patient will require more care at home than she is able to provide and as such patient currently awaiting PT evaluation and recommendations and possible placement into ALF.    Hospital Course: 1. Transient slurred speech; Resolved  -work up for stroke/tia which was negative, including and EEG.  Neurology recommends the following:  1. Continue Depakote ER 1500 mg daily - would have trough level checked at HD on Friday  2. 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 Follow PT recs  4. VTE ppx - heparin TID  5. Stable from neurology point of view, neuro to sign off - Patient should f/u with Dr. Marjory Lies (scheduled for Nov)  Started discharge planning  and currently awaiting PT's recommendations. Patient inquiring about ALF and social worker currently assessing case.  2. UTI; +Urine culture has grown > 100,000 CFU of enterococcus; ? Colonization vs Infection; afebrile; no leukocytosis  -started PO ampicillin;  3. ESRD on HD  - Nephrology consulted for HD 06/22/13 ; cont HD outpatient as scheduled   4. Seizure d/o  - Currently on Depakote  5. PVF  - on aspirin and statin  6. PAF  - on Metoprolol, will plan on continuing. Currently rate controlled  7. DM  - Will place on SSI (S/Renal)  - continue to monitor blood sugars.  8. Depression; no suicidal ideation or plans; start lexapro     Procedures:  HD (i.e. Studies not automatically included, echos, thoracentesis, etc; not x-rays)  Consultations:  Neurology, nephrology   Discharge Exam: Filed Vitals:   06/23/13 0842  BP: 114/60  Pulse: 71  Temp: 97.4 F (36.3 C)  Resp: 18    General: alert  Cardiovascular: s1,s2 rrr Respiratory: CTA BL:   Discharge Instructions  Discharge Orders   Future Appointments Provider Department Dept Phone   06/23/2013 12:30 PM [facility] [place] [facility] [address] [phone]   08/04/2013 2:00 PM Suanne Marker, MD [facility] [phone]   10/06/2013 3:00 PM Suanne Marker, MD [facility] [phone]   Future Orders Complete By Expires   Diet - low sodium heart healthy  As directed    Discharge instructions  As directed    Comments:  Please follow up with PCP in 1-2 weeks as needed   Increase activity slowly  As directed        Medication List         ampicillin 500 MG capsule  Commonly known as:  PRINCIPEN  Take 1 capsule (500 mg total) by mouth every 12 (twelve) hours.     aspirin EC 325 MG tablet  Take 325 mg by mouth daily.     atorvastatin 40 MG tablet  Commonly known as:  LIPITOR  Take 1 tablet (40 mg total) by mouth daily.     divalproex  500 MG 24 hr tablet  Commonly known as:  DEPAKOTE ER  Take 3 tablets (1,500 mg total) by mouth daily.     escitalopram 5 MG tablet  Commonly known as:  LEXAPRO  Take 1 tablet (5 mg total) by mouth daily.     gabapentin 100 MG capsule  Commonly known as:  NEURONTIN  Take 1 capsule by mouth at bedtime.     metoprolol tartrate 12.5 mg Tabs tablet  Commonly known as:  LOPRESSOR  Take 0.5 tablets (12.5 mg total) by mouth 2 (two) times daily.     nitroGLYCERIN 0.4 MG SL tablet  Commonly known as:  NITROSTAT  Place 1 tablet (0.4 mg total) under the tongue every 5 (five) minutes x 3 doses as needed for chest pain.     omeprazole 20 MG tablet  Commonly known as:  PRILOSEC OTC  Take 20 mg by mouth daily as needed (for heartburn).     PRORENAL QD Caps  Take 1 capsule by mouth daily.     sevelamer carbonate 800 MG tablet  Commonly known as:  RENVELA  Take 3,200 mg by mouth 3 (three) times daily with meals.       No Known Allergies     Follow-up Information   Follow up with COLADONATO,JOSEPH A, MD Today.   Specialty:  Nephrology   Contact information:   [address] [facility] [place] [address] [phone]        The results of significant diagnostics from this hospitalization (including imaging, microbiology, ancillary and laboratory) are listed below for reference.    Significant Diagnostic Studies: Dg Chest 2 View  06/19/2013   CLINICAL DATA:  Hypertension, tremors  EXAM: CHEST  2 VIEW  COMPARISON:  05/26/2013  FINDINGS: The cardiac shadow is stable. The lungs are well aerated bilaterally. The lungs are well aerated. Some mild elevation of the right hemidiaphragm is again seen. Mild right basilar atelectasis is noted. No sizable effusion is seen.  IMPRESSION: Right basilar atelectasis.   Electronically Signed   By: Alcide Clever M.D.   On: 06/19/2013 17:45   Dg Chest 2 View  05/26/2013   *RADIOLOGY REPORT*  Clinical Data: Chest pain  CHEST - 2 VIEW   Comparison: February 15, 2013.  Findings: Cardiomediastinal silhouette appears normal.  No acute pulmonary disease is noted.  Bony thorax is intact.  IMPRESSION: No acute cardiopulmonary abnormality seen.   Original Report Authenticated By: Lupita Raider.,  M.D.   Ct Head Wo Contrast  06/19/2013   *RADIOLOGY REPORT*  Clinical Data: Slurred speech, shaking  CT HEAD WITHOUT CONTRAST  Technique:  Contiguous axial images were obtained from the base of the skull through the vertex without contrast.  Comparison: None.  Findings: No acute intracranial hemorrhage, mass lesion, mass effect, midline shift or hydrocephalus.  Gray-white differentiation is preserved throughout.  Rounded area of relative hypoattenuation  in the superior aspect of the posterior limb left internal capsule extending cephalad into the corona radiata may represent an area of acute ischemia.  Extensive encephalomalacia in the inferior right temporal lobe combined with the surgical changes of right temporal craniotomy suggest prior neurosurgical intervention. Encephalomalacia extends into the right cerebellum. Periventricular white matter hypoattenuation is suggestive of underlying chronic microvascular ischemia.  No acute soft tissue or calvarial abnormality.  Intracranial atherosclerosis in the bilateral cavernous carotid arteries. Normal aeration of the mastoid air cells and paranasal sinuses.  IMPRESSION:  1.  Rounded focus of low attenuation in the superior aspect of the posterior limb of the left internal capsule extending toward the corona radiata may represent an area of acute / subacute ischemia. MRI could further evaluate if clinically warranted.  2.  Surgical changes of prior right temporal craniotomy with extensive encephalomalacia in the right temporal lobe and inferior right cerebellum.  Query history of prior tumor resection or intracranial hematoma evacuation.  3.  Intracranial atherosclerosis and the sequela of chronic microvascular white  matter disease.   Original Report Authenticated By: Malachy Moan, M.D.   Mri Brain Without Contrast  06/20/2013   CLINICAL DATA:  72 year old male. End-stage renal disease. Atrial fibrillation. Hypertensive diabetic hyperlipidemic patient with history of seizures. Brain tumor post resection (patient unsure of type of tumor and followed at [facility]). Presenting to emergency room which chills.  EXAM: MRI HEAD WITHOUT CONTRAST; MRA HEAD WITHOUT CONTRAST  TECHNIQUE: Multiplanar, multisequence MR imaging was performed. No intravenous contrast was administered.  COMPARISON:  06/19/2013 CT. No comparison MR.  FINDINGS: Exam is motion degraded.  No acute infarct.  No intracranial hemorrhage.  Prior right frontal craniotomy. Encephalomalacia right temporal lobe. No definitive findings of recurrent tumor although evaluation limited without contrast or availability of comparison prior postoperative exams.  Atrophy.  Major intracranial vascular structures are patent.  Minimal paranasal sinus mucosal thickening.  Minimal exophthalmos.  Very mild spinal stenosis C3-4.  IMPRESSION: Exam is motion degraded.  No acute infarct.  No intracranial hemorrhage.  Prior right frontal craniotomy. Encephalomalacia right temporal lobe. No definitive findings of recurrent tumor although evaluation limited without contrast or availability of comparison prior postoperative exams.  Atrophy.  MRA circle Willis:  Findings :  Exam is motion degraded.  Limited for evaluating for aneurysm or grading stenosis accurately.  Ectatic cavernous segment of the internal carotid artery bilaterally.  Ectatic distal vertical cervical segment of the right internal carotid artery.  Moderate narrowing A1 segment left anterior cerebral artery and A2 segment left anterior cerebral artery.  Moderate narrowing M1 segment right middle cerebral artery.  Middle cerebral artery moderate to marked branch vessel irregularity bilaterally.  Mild to moderate  narrowing distal left vertebral artery.  Nonvisualization PICAs and left AICA.  Small right AICA.  Mild to moderate narrowing basilar artery. Evaluation limited by motion.  Moderate to marked narrowing superior cerebral artery bilaterally.  Mild to moderate narrowing posterior cerebral artery proximally and distally with most notable stenosis P1 segment right posterior cerebral artery.  Impression :  Exam is motion degraded.  Intracranial atherosclerotic type changes as noted above.   Electronically Signed   By: Bridgett Larsson M.D.   On: 06/20/2013 09:53   Mr Maxine Glenn Head/brain Wo Cm  06/20/2013   CLINICAL DATA:  73 year old male. End-stage renal disease. Atrial fibrillation. Hypertensive diabetic hyperlipidemic patient with history of seizures. Brain tumor post resection (patient unsure of type of tumor and followed at [facility]). Presenting to emergency room  which chills.  EXAM: MRI HEAD WITHOUT CONTRAST; MRA HEAD WITHOUT CONTRAST  TECHNIQUE: Multiplanar, multisequence MR imaging was performed. No intravenous contrast was administered.  COMPARISON:  06/19/2013 CT. No comparison MR.  FINDINGS: Exam is motion degraded.  No acute infarct.  No intracranial hemorrhage.  Prior right frontal craniotomy. Encephalomalacia right temporal lobe. No definitive findings of recurrent tumor although evaluation limited without contrast or availability of comparison prior postoperative exams.  Atrophy.  Major intracranial vascular structures are patent.  Minimal paranasal sinus mucosal thickening.  Minimal exophthalmos.  Very mild spinal stenosis C3-4.  IMPRESSION: Exam is motion degraded.  No acute infarct.  No intracranial hemorrhage.  Prior right frontal craniotomy. Encephalomalacia right temporal lobe. No definitive findings of recurrent tumor although evaluation limited without contrast or availability of comparison prior postoperative exams.  Atrophy.  MRA circle Willis:  Findings :  Exam is motion degraded.  Limited for  evaluating for aneurysm or grading stenosis accurately.  Ectatic cavernous segment of the internal carotid artery bilaterally.  Ectatic distal vertical cervical segment of the right internal carotid artery.  Moderate narrowing A1 segment left anterior cerebral artery and A2 segment left anterior cerebral artery.  Moderate narrowing M1 segment right middle cerebral artery.  Middle cerebral artery moderate to marked branch vessel irregularity bilaterally.  Mild to moderate narrowing distal left vertebral artery.  Nonvisualization PICAs and left AICA.  Small right AICA.  Mild to moderate narrowing basilar artery. Evaluation limited by motion.  Moderate to marked narrowing superior cerebral artery bilaterally.  Mild to moderate narrowing posterior cerebral artery proximally and distally with most notable stenosis P1 segment right posterior cerebral artery.  Impression :  Exam is motion degraded.  Intracranial atherosclerotic type changes as noted above.   Electronically Signed   By: Bridgett Larsson M.D.   On: 06/20/2013 09:53    Microbiology: Recent Results (from the past 240 hour(s))  CULTURE, BLOOD (ROUTINE X 2)     Status: None   Collection Time    06/20/13  1:30 AM      Result Value Range Status   Specimen Description BLOOD RIGHT ARM   Final   Special Requests BOTTLES DRAWN AEROBIC ONLY 5CC   Final   Culture  Setup Time     Final   Value: 06/20/2013 09:29     Performed at [facility]   Culture     Final   Value:        BLOOD CULTURE RECEIVED NO GROWTH TO DATE CULTURE WILL BE HELD FOR 5 DAYS BEFORE ISSUING A FINAL NEGATIVE REPORT     Performed at [facility]   Report Status PENDING   Incomplete  CULTURE, BLOOD (ROUTINE X 2)     Status: None   Collection Time    06/20/13  1:40 AM      Result Value Range Status   Specimen Description BLOOD RIGHT ARM   Final   Special Requests BOTTLES DRAWN AEROBIC ONLY 10CC   Final   Culture  Setup Time     Final   Value: 06/20/2013 09:28      Performed at [facility]   Culture     Final   Value:        BLOOD CULTURE RECEIVED NO GROWTH TO DATE CULTURE WILL BE HELD FOR 5 DAYS BEFORE ISSUING A FINAL NEGATIVE REPORT     Performed at [facility]   Report Status PENDING   Incomplete  URINE CULTURE  Status: None   Collection Time    06/20/13  3:01 PM      Result Value Range Status   Specimen Description URINE, CLEAN CATCH   Final   Special Requests NONE   Final   Culture  Setup Time     Final   Value: 06/19/2013 16:05     Performed at [facility] Count     Final   Value: >=100,000 COLONIES/ML     Performed at [facility]   Culture     Final   Value: ENTEROCOCCUS SPECIES     Performed at [facility]   Report Status 06/22/2013 FINAL   Final   Organism ID, Bacteria ENTEROCOCCUS SPECIES   Final     Labs: Basic Metabolic Panel:  Recent Labs Lab 06/19/13 1807 06/19/13 2340 06/22/13 1341  NA 135  --  133*  K 4.6  --  4.4  CL 88*  --  91*  CO2 27  --  24  GLUCOSE 228*  --  194*  BUN 59*  --  88*  CREATININE 10.33* 10.63* 13.86*  CALCIUM 9.8  --  8.9  PHOS  --   --  7.2*   Liver Function Tests:  Recent Labs Lab 06/22/13 1341  ALBUMIN 3.1*   No results found for this basename: LIPASE, AMYLASE,  in the last 168 hours No results found for this basename: AMMONIA,  in the last 168 hours CBC:  Recent Labs Lab 06/19/13 1807 06/19/13 2340 06/22/13 1341  WBC 9.9 10.5 8.5  HGB 12.4* 11.6* 10.5*  HCT 35.4* 33.3* 30.0*  MCV 91.2 90.7 90.1  PLT 284 222 270   Cardiac Enzymes: No results found for this basename: CKTOTAL, CKMB, CKMBINDEX, TROPONINI,  in the last 168 hours BNP: BNP (last 3 results)  Recent Labs  05/26/13 1700  PROBNP 699.3*   CBG:  Recent Labs Lab 06/22/13 1150 06/22/13 1902 06/22/13 2305 06/23/13 0633 06/23/13 0737  GLUCAP 181* 190* 184* 94 93       Signed:  Tamkia Temples N  [facility] 06/23/2013, 9:58  AM

## 2013-06-23 NOTE — Progress Notes (Signed)
ALF bed confirmed for patient at [facility]- ALF. MD advised of above and says he is ok for d/c- will arrange for family transport.   Reece Levy, MSW [phone]

## 2013-06-23 NOTE — Progress Notes (Signed)
Had a call from [facility] with some questions about FL2. Requested them to call back while we have our social workers up here (tomoro morning).

## 2013-06-23 NOTE — Progress Notes (Addendum)
Called [facility] twice to give report, but couldn't get anybody to take report. The operator put me on hold for more than long time. Tried again no replay, couldn't give report.  [facility] RN

## 2013-06-26 LAB — CULTURE, BLOOD (ROUTINE X 2): Culture: NO GROWTH

## 2013-06-28 ENCOUNTER — Telehealth (HOSPITAL_COMMUNITY): Payer: Self-pay | Admitting: *Deleted

## 2013-08-03 ENCOUNTER — Encounter: Payer: Medicare Other | Admitting: Physician Assistant

## 2013-08-04 ENCOUNTER — Ambulatory Visit: Payer: Medicare Other | Admitting: Diagnostic Neuroimaging

## 2013-08-09 ENCOUNTER — Encounter: Payer: Self-pay | Admitting: Physician Assistant

## 2013-08-09 ENCOUNTER — Ambulatory Visit (INDEPENDENT_AMBULATORY_CARE_PROVIDER_SITE_OTHER): Payer: Medicare Other | Admitting: Physician Assistant

## 2013-08-09 VITALS — BP 122/68 | HR 77 | Ht 76.0 in | Wt 258.0 lb

## 2013-08-09 DIAGNOSIS — R0789 Other chest pain: Secondary | ICD-10-CM

## 2013-08-09 DIAGNOSIS — G40209 Localization-related (focal) (partial) symptomatic epilepsy and epileptic syndromes with complex partial seizures, not intractable, without status epilepticus: Secondary | ICD-10-CM

## 2013-08-09 DIAGNOSIS — I480 Paroxysmal atrial fibrillation: Secondary | ICD-10-CM

## 2013-08-09 DIAGNOSIS — E785 Hyperlipidemia, unspecified: Secondary | ICD-10-CM

## 2013-08-09 DIAGNOSIS — I10 Essential (primary) hypertension: Secondary | ICD-10-CM

## 2013-08-09 DIAGNOSIS — I4891 Unspecified atrial fibrillation: Secondary | ICD-10-CM

## 2013-08-09 NOTE — Patient Instructions (Signed)
NO CHANGES WERE MADE TODAY WITH YOUR MEDICATIONS  FOLLOW UP AS NEEDED

## 2013-08-09 NOTE — Progress Notes (Signed)
[address] [place], [place]  [address] Phone: [phone] Fax:  [phone]  Date:  08/09/2013   ID:  [name] [name], DOB 18-Dec-1940, MRN [id number]  PCP: Dr. Azucena Cecil Nephrologist:  [name], MD  Cardiologist:  Dr. Verne Carrow     History of Present Illness: [name] [name] is a 72 y.o. male Jehovah's Witness with a hx of obesity, HTN, HL, T2DM, PAD s/p L BKA, parox AFib, diastolic CHF, ESRD (due to HTN/DM - related nephrotic syndrome 2011), oligoastrocytoma s/p resection in 2009, seizure d/o.  He was evaluated by Dr. Arvilla Meres in the hospital for chest pain in 05/2013.  LHC (9/14):  Normal cors, EF 65%.  He was admitted again 10/5-10/9 with slurred speech. He was seen by the stroke service with a negative workup including EEG.  Medications were adjusted for seizure prophylaxis.  He lives at [facility].  He is doing well.  He works with PT.  Denies chest pain, dyspnea, orthopnea, PND, edema.  Denies syncope.   Recent Labs: 05/26/2013: ALT 11; Pro B Natriuretic peptide (BNP) 699.3*  06/20/2013: HDL 65; LDL (calc) 65  06/22/2013: Creatinine 13.86*; Hemoglobin 10.5*; Potassium 4.4   Wt Readings from Last 3 Encounters:  08/09/13 258 lb (117.028 kg)  06/22/13 249 lb 9 oz (113.2 kg)  05/28/13 246 lb 7.6 oz (111.8 kg)     Past Medical History  Diagnosis Date  . Dialysis patient   . Peripheral vascular disease   . ESRD (end stage renal disease) on dialysis 09/2010    MWF  . Hypertension   . Hyperlipidemia   . Arthritis     Gout  . Paroxysmal atrial fibrillation   . Brain tumor 2009  . Seizure disorder 2009  . Anemia   . Hyperparathyroidism, secondary   . Diabetes mellitus without complication     Type II, diet controlled  . Proteus mirabilis infection   . CHF (congestive heart failure)     grade 1 DD, preserved EF, [facility] records  . 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 brain  tumor  . GERD (gastroesophageal reflux disease)   . Hx of cardiac cath     a. LHC (9/14):  Normal cors, EF 65%.     Current Outpatient Prescriptions  Medication Sig Dispense Refill  . aspirin EC 325 MG tablet Take 325 mg by mouth daily.      Marland Kitchen atorvastatin (LIPITOR) 40 MG tablet Take 1 tablet (40 mg total) by mouth daily.  30 tablet  0  . dextrose 5 % SOLN 50 mL with gentamicin 40 MG/ML SOLN 110 mg Inject 110 mg into the vein every hemodialysis (for 1 week with dialysis).      Marland Kitchen divalproex (DEPAKOTE ER) 500 MG 24 hr tablet Take 3 tablets (1,500 mg total) by mouth daily.      Marland Kitchen escitalopram (LEXAPRO) 5 MG tablet Take 1 tablet (5 mg total) by mouth daily.  30 tablet  0  . gabapentin (NEURONTIN) 100 MG capsule Take 1 capsule by mouth at bedtime.      . metoprolol tartrate (LOPRESSOR) 12.5 mg TABS tablet Take 0.5 tablets (12.5 mg total) by mouth 2 (two) times daily.  60 tablet  0  . Multiple Vitamins-Minerals (PRORENAL QD) CAPS Take 1 capsule by mouth daily.      . nitroGLYCERIN (NITROSTAT) 0.4 MG SL tablet Place 1 tablet (0.4 mg total) under the tongue every 5 (five)  minutes x 3 doses as needed for chest pain.  30 tablet  12  . omeprazole (PRILOSEC OTC) 20 MG tablet Take 20 mg by mouth daily as needed (for heartburn).      . sevelamer carbonate (RENVELA) 800 MG tablet Take 3,200 mg by mouth 3 (three) times daily with meals.       No current facility-administered medications for this visit.    Allergies:   Review of patient's allergies indicates no known allergies.   Social History:  The patient  reports that he has never smoked. He has never used smokeless tobacco. He reports that he drinks about 0.6 ounces of alcohol per week. He reports that he does not use illicit drugs.   Family History:  The patient's family history includes Alcohol abuse in his father; Arthritis in his sister; Hypertension in his father and mother; Seizures in his mother; Stroke (age of onset: 76) in his mother. There  is no history of Heart attack or Heart failure.   ROS:  Please see the history of present illness.      All other systems reviewed and negative.   PHYSICAL EXAM: VS:  BP 122/68  Pulse 77  Ht 6\' 4"  (1.93 m)  Wt 258 lb (117.028 kg)  BMI 31.42 kg/m2 Well nourished, well developed, in no acute distress HEENT: normal Neck: no JVD at 90 Cardiac:  normal S1, S2; RRR; no murmur Lungs:  clear to auscultation bilaterally, no wheezing, rhonchi or rales Abd: soft, nontender, no hepatomegaly Ext: no right LE edema; right groin without hematoma or bruit Skin: warm and dry Neuro:  CNs 2-12 intact, no focal abnormalities noted  EKG:  NSR, HR 77, PACs     ASSESSMENT AND PLAN:  1. Chest Pain: No recurrence. Cardiac catheterization demonstrated normal coronary arteries. His chest pain is noncardiac and he requires no further cardiac workup. He can follow up with cardiology as needed. 2. Paroxysmal Atrial Fibrillation: Details of this are not known. He remains on aspirin only. If he has recurrence of atrial fibrillation, he would need anticoagulation therapy (if he is deemed an appropriate candidate). 3. Hyperlipidemia: His primary care. 4. Hypertension: Controlled. 5. Seizure Disorder: Follow up with neurology as planned. 6. Disposition: Follow up with cardiology as needed.  Signed, Tereso Newcomer, PA-C  08/09/2013 4:17 PM

## 2013-08-16 ENCOUNTER — Ambulatory Visit (INDEPENDENT_AMBULATORY_CARE_PROVIDER_SITE_OTHER): Payer: Medicare Other | Admitting: Nurse Practitioner

## 2013-08-16 ENCOUNTER — Encounter: Payer: Self-pay | Admitting: Nurse Practitioner

## 2013-08-16 VITALS — BP 137/80 | HR 72 | Temp 97.7°F | Wt 253.0 lb

## 2013-08-16 DIAGNOSIS — G40209 Localization-related (focal) (partial) symptomatic epilepsy and epileptic syndromes with complex partial seizures, not intractable, without status epilepticus: Secondary | ICD-10-CM

## 2013-08-16 MED ORDER — DIVALPROEX SODIUM ER 500 MG PO TB24: 1500.0000 mg | ORAL_TABLET | Freq: Every day | ORAL | Status: DC

## 2013-08-16 NOTE — Progress Notes (Signed)
PATIENT: [name] [name] DOB: [birth year]   REASON FOR VISIT: follow up for complex partial seizures HISTORY FROM: patient  HISTORY OF PRESENT ILLNESS: UPDATE 08/16/13 (LL): Since last visit, patient was having chest pain and had Left heart Cardiac catheterization 05/27/13 which demonstrated normal coronary arteries.  He was later taken to the emergency room for evaluation of possible TIA or stroke on 06/19/13. Patient had intermittent episodes of shaking of his extremities on day of admission. During one of those episodes he had a 15 minute period of slurred speech which was noticed by his wife. Symptoms lasted about 15 minutes then resolved with no recurrence, even though he's had recurrent episodes of shaking of his extremities.  Stroke workup was negative.  His valproic acid level was low and he was increased to divalproex ER 1500mg  daily for seizure control.  Just started rehab last week at his assisted living facility, twice a week for walking/balance.  Looking back over the last year retrospectively patient feels like discord between his wife and himself caused many of his physical complaints.  He has no seizures or other shaking/ neuro symptoms since leaving the hospital.  They are living apart now and he feels the best he hasfelt in a long time, he states.  UPDATE 03/30/13 (VP): Since last visit, dilantin level was very low, so I started divalproex ER 1000mg  daily, and then tapered dilantin off. He is doing much better now. No further seizures. He has some periodic limb movements of sleep type kicks at night. No day time seizures. Short term memory still affected. Also went to ER in June for fever, found to have UTI.   UPDATE 02/09/13 (VP): Since last visit, still having 3-4 episodes per week, with slurred speech, random jerking movements, but cannot talk, but able to hear and stay awake. Episodes do not seem to be correlated with dialysis timing. Patient did not bring his medicines for me to  review. Is not sure about his Dilantin dosing, but he thinks he takes 50 mg tablets, 10 tablets every morning. Also he went to [facility] and had follow up MRI brain on 11/10/12, which shows stable right temporal post surgical changes, and is stable from MRI on 01/07/11. He did not see them in clinic.   Also, I received prior records, which I summarize as follows:  Patient had 2 focal seizures in January 2009. These initially consisted of feeling off balance, disorientation, difficulty speaking, left-sided numbness. He underwent stereotactic biopsy on (10/11/07, Dr. Velora Heckler, [facility]), demonstrating a well-differentiated astrocytoma. Patient was then referred to [facility] for definitive treatment. He underwent right temporal lobe tumor resection (11/09/07, Dr. Garnette Czech, [facility]) and pathology demonstrated oligoastrocytoma. He then followed up at [facility] on 12/29/07.  Additional pathology studies: Ki-67 4%, MGMT 25% (MGMT promoter methylation negative), 1p intact, 19q loss. No further radiation or chemotherapy was suggested. Followup MRIs were recommended over time. He follow up in 2010 multiple times with stable MRIs, then not again in [facility] clinic until 07/15/12. Last 2 MRIs were 01/07/11 and 11/10/12, which have been stable. Has not been back to [facility] brain tumor center for clinic evaluation since 07/15/12.  PRIOR HPI (10/22/12): 72 year old right-handed male with history of diabetes, paroxysmal atrial fibrillation, gout, end-stage renal disease on hemodialysis, depression, anxiety, brain tumor (patient not sure what type) here for evaluation of slurred speech trouble talking, trouble swallowing and ringing in the ears. Patient is here with his wife.  Patient previously lived in [place],  treated at [facility] for a brain tumor. He points to the right frontal region of his head. Is not sure if this was a benign or malignant tumor. Patient states that he was having left leg weakness problems,  diagnosed with a brain tumor. Apparently this was treated at [facility] with biopsy and ultimately resection. He does not recall chemotherapy or radiation treatments. He has had followup scans and visits at [facility] with neurosurgery and neurology but does not know with whom. Last MRI scan was in April 2012. Last visit with neurologist was 4 months ago.  Patient also reports history of seizures, around the time of diagnosis of brain tumor. Patient describes seizures as episodes where he is unable to talk, foaming at the mouth, but awake, aware, able to hear. No convulsions. Patient has been treated with Dilantin. He reports taking 10 tablets of Dilantin the morning and 5 tablets at night. This is contradictory to his referring notes where he is apparently on 100 mg 3 times a day. Patient is not sure of the strength of his tablets.   REVIEW OF SYSTEMS: Full 14 system review of systems performed and notable only for Hearing loss ringing in ears trouble swallowing blurred vision snoring incontinence urination problems feeling cold joint pain cramps aching muscles hallucination disinterest in activity too much sleep depression tremor RLS insomnia memory loss confusion.  ALLERGIES: No Known Allergies  HOME MEDICATIONS: Outpatient Prescriptions Prior to Visit  Medication Sig Dispense Refill  . aspirin EC 325 MG tablet Take 325 mg by mouth daily.      Marland Kitchen atorvastatin (LIPITOR) 40 MG tablet Take 1 tablet (40 mg total) by mouth daily.  30 tablet  0  . divalproex (DEPAKOTE ER) 500 MG 24 hr tablet Take 3 tablets (1,500 mg total) by mouth daily.      Marland Kitchen gabapentin (NEURONTIN) 100 MG capsule Take 1 capsule by mouth at bedtime.      . nitroGLYCERIN (NITROSTAT) 0.4 MG SL tablet Place 1 tablet (0.4 mg total) under the tongue every 5 (five) minutes x 3 doses as needed for chest pain.  30 tablet  12  . omeprazole (PRILOSEC OTC) 20 MG tablet Take 20 mg by mouth daily as needed (for heartburn).      . sevelamer carbonate  (RENVELA) 800 MG tablet Take 3,200 mg by mouth 3 (three) times daily with meals.      Marland Kitchen escitalopram (LEXAPRO) 5 MG tablet Take 1 tablet (5 mg total) by mouth twice daily.  30 tablet  0  . dextrose 5 % SOLN 50 mL with gentamicin 40 MG/ML SOLN 110 mg Inject 110 mg into the vein every hemodialysis (for 1 week with dialysis).      . metoprolol tartrate (LOPRESSOR) 12.5 mg TABS tablet Take 0.5 tablets (12.5 mg total) by mouth 2 (two) times daily.  60 tablet  0  . Multiple Vitamins-Minerals (PRORENAL QD) CAPS Take 1 capsule by mouth daily.       No facility-administered medications prior to visit.    PAST MEDICAL HISTORY: Past Medical History  Diagnosis Date  . Dialysis patient   . Peripheral vascular disease   . ESRD (end stage renal disease) on dialysis 09/2010    MWF  . Hypertension   . Hyperlipidemia   . Arthritis     Gout  . Paroxysmal atrial fibrillation   . Brain tumor 2009  . Seizure disorder 2009  . Anemia   . Hyperparathyroidism, secondary   . Diabetes mellitus without complication  Type II, diet controlled  . Proteus mirabilis infection   . CHF (congestive heart failure)     grade 1 DD, preserved EF, [facility] records  . 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 brain tumor  . GERD (gastroesophageal reflux disease)   . Hx of cardiac cath     a. LHC (9/14):  Normal cors, EF 65%.     PAST SURGICAL HISTORY: Past Surgical History  Procedure Laterality Date  . Av fistula placement Left     arm  . 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 Eye surgery Bilateral 2000    cataract  . Retinopathy surgery      Hx. of laser treatments for diabetics    FAMILY HISTORY: Family History  Problem Relation Age of Onset  . Seizures Mother   . Stroke Mother 34  . Hypertension Mother   . Alcohol abuse Father   . Hypertension Father   . Arthritis Sister   . Heart attack Neg Hx   .  Heart failure Neg Hx     SOCIAL HISTORY: History   Social History  . Marital Status: Married    Spouse Name: Santina Evans    Number of Children: 3  . Years of Education: 12th   Occupational History  .      N/A   Social History Main Topics  . Smoking status: Never Smoker   . Smokeless tobacco: Never Used  . Alcohol Use: No  . Drug Use: No  . Sexual Activity: No   Other Topics Concern  . Not on file   Social History Narrative   Pt lives at home with spouse.   Caffeine Use: 1-2 cups daily.   Uses a cane for ambulation and prosthetic limb  Left leg.       PHYSICAL EXAM  Filed Vitals:   08/16/13 0932  BP: 137/80  Pulse: 72  Temp: 97.7 F (36.5 C)  TempSrc: Oral  Weight: 253 lb (114.76 kg)   Body mass index is 30.81 kg/(m^2).  Generalized: Well developed, in no acute distress  Head: normocephalic and atraumatic. Oropharynx benign  Neck: Supple, no carotid bruits  Cardiac: Regular rate rhythm, no murmur  Musculoskeletal: LEFT BELOW KNEE AMPUTATION.   Neurologic Exam  Mental Status: Awake, alert. Language is fluent and comprehension intact.  Cranial Nerves: No evidence of papilledema on funduscopic exam. Pupils are equal and reactive to light. Visual fields are full to confrontation, EXCEPT LEFT SUPERIOR QUADRANTANOPSIA. Conjugate eye movements are full and symmetric. Facial sensation and strength are symmetric. Hearing is intact. Palate elevated symmetrically and uvula is midline. Shoulder shrug is symmetric. Tongue is midline.  Motor: Normal bulk and tone. Full strength in the upper and lower extremities. LEFT BELOW KNEE AMPUTATION. No pronator drift.  Sensory: DECR VIB AT TOES.  Coordination: No ataxia or dysmetria on finger-nose or rapid alternating movement testing.  Gait and Station: UNSTEADY GAIT, ambulates with cane, has left leg prosthesis. Reflexes: BUE TRACE, RIGHT KNEE TRACE, RIGHT ANKLE 0. LEFT BELOW KNEE AMPUTATION.  DIAGNOSTIC DATA (LABS, IMAGING,  TESTING) - I reviewed patient records, labs, notes, testing and imaging myself where available.  Lab Results  Component Value Date   WBC 8.5 06/22/2013   HGB 10.5* 06/22/2013   HCT 30.0* 06/22/2013   MCV 90.1 06/22/2013   PLT 270 06/22/2013      Component Value Date/Time  NA 133* 06/22/2013 1341   NA 138 02/09/2013 1350   K 4.4 06/22/2013 1341   CL 91* 06/22/2013 1341   CO2 24 06/22/2013 1341   GLUCOSE 194* 06/22/2013 1341   GLUCOSE 137* 02/09/2013 1350   BUN 88* 06/22/2013 1341   BUN 25 02/09/2013 1350   CREATININE 13.86* 06/22/2013 1341   CALCIUM 8.9 06/22/2013 1341   PROT 6.8 05/26/2013 1310   PROT 6.8 02/09/2013 1350   ALBUMIN 3.1* 06/22/2013 1341   AST 13 05/26/2013 1310   ALT 11 05/26/2013 1310   ALKPHOS 77 05/26/2013 1310   BILITOT 0.3 05/26/2013 1310   GFRNONAA 3* 06/22/2013 1341   GFRAA 4* 06/22/2013 1341   Lab Results  Component Value Date   CHOL 161 06/20/2013   HDL 65 06/20/2013   LDLCALC 65 06/20/2013   TRIG 155* 06/20/2013   CHOLHDL 2.5 06/20/2013   Lab Results  Component Value Date   HGBA1C 6.9* 06/19/2013    03/17/13 EEG - moderate diffuse slowing with intermittent frontal rhythmic activity (FIRDA) consistent with moderate encephalopathy. No focal or epileptiform activity is seen. No electrographic seizures are recorded.  06/19/13 Valproic Acid level-11.9  ASSESSMENT AND PLAN 72 y.o. male with intermittent slurrred speech, jerking movements, ringing in ears, could be partial seizures. Much better controlled with divalproex than with dilantin.  Dx: right temporal oligoastrocytoma (s/p resection) and complex partial seizure d/o   PLAN:  1. Continue divalproex ER 1500mg  daily for seizure control  2. No driving for now  3. Check valproic acid level today.  Orders Placed This Encounter  Procedures  . Valproic acid level   Return in about 6 months (around 02/14/2014). with NP or Dr. Marjory Lies.  Ronal Fear, MSN, NP-C 08/16/2013, 10:11 AM [facility] [address] [place], [place] [address] [phone]  Note: This document was prepared with digital dictation and possible smart phrase technology. Any transcriptional errors that result from this process are unintentional.

## 2013-08-17 LAB — VALPROIC ACID LEVEL: Valproic Acid Lvl: 63 ug/mL (ref 50–100)

## 2013-08-25 NOTE — Progress Notes (Signed)
I reviewed note and agree with plan.   Ezmae Speers R. Marylouise Mallet, MD  Certified in Neurology, Neurophysiology and Neuroimaging  [facility] [address], [address] [place], [place] [address] [phone]   

## 2013-09-08 ENCOUNTER — Other Ambulatory Visit: Payer: Self-pay

## 2013-09-08 ENCOUNTER — Encounter (HOSPITAL_COMMUNITY): Payer: Self-pay | Admitting: Emergency Medicine

## 2013-09-08 ENCOUNTER — Emergency Department (HOSPITAL_COMMUNITY)
Admission: EM | Admit: 2013-09-08 | Discharge: 2013-09-08 | Disposition: A | Discharge status: Home / Self Care | Payer: Medicare Other | Attending: Emergency Medicine | Admitting: Emergency Medicine

## 2013-09-08 ENCOUNTER — Emergency Department (HOSPITAL_COMMUNITY): Payer: Medicare Other

## 2013-09-08 DIAGNOSIS — M109 Gout, unspecified: Secondary | ICD-10-CM | POA: Insufficient documentation

## 2013-09-08 DIAGNOSIS — S0993XA Unspecified injury of face, initial encounter: Secondary | ICD-10-CM | POA: Insufficient documentation

## 2013-09-08 DIAGNOSIS — H6120 Impacted cerumen, unspecified ear: Secondary | ICD-10-CM | POA: Insufficient documentation

## 2013-09-08 DIAGNOSIS — Z85841 Personal history of malignant neoplasm of brain: Secondary | ICD-10-CM | POA: Insufficient documentation

## 2013-09-08 DIAGNOSIS — Z79899 Other long term (current) drug therapy: Secondary | ICD-10-CM | POA: Insufficient documentation

## 2013-09-08 DIAGNOSIS — E119 Type 2 diabetes mellitus without complications: Secondary | ICD-10-CM | POA: Insufficient documentation

## 2013-09-08 DIAGNOSIS — Z7982 Long term (current) use of aspirin: Secondary | ICD-10-CM | POA: Insufficient documentation

## 2013-09-08 DIAGNOSIS — S0990XA Unspecified injury of head, initial encounter: Secondary | ICD-10-CM | POA: Insufficient documentation

## 2013-09-08 DIAGNOSIS — D649 Anemia, unspecified: Secondary | ICD-10-CM | POA: Insufficient documentation

## 2013-09-08 DIAGNOSIS — I4891 Unspecified atrial fibrillation: Secondary | ICD-10-CM | POA: Insufficient documentation

## 2013-09-08 DIAGNOSIS — G40909 Epilepsy, unspecified, not intractable, without status epilepticus: Secondary | ICD-10-CM | POA: Insufficient documentation

## 2013-09-08 DIAGNOSIS — N186 End stage renal disease: Secondary | ICD-10-CM | POA: Insufficient documentation

## 2013-09-08 DIAGNOSIS — H6122 Impacted cerumen, left ear: Secondary | ICD-10-CM

## 2013-09-08 DIAGNOSIS — Y929 Unspecified place or not applicable: Secondary | ICD-10-CM | POA: Insufficient documentation

## 2013-09-08 DIAGNOSIS — M5030 Other cervical disc degeneration, unspecified cervical region: Secondary | ICD-10-CM | POA: Insufficient documentation

## 2013-09-08 DIAGNOSIS — I509 Heart failure, unspecified: Secondary | ICD-10-CM | POA: Insufficient documentation

## 2013-09-08 DIAGNOSIS — W1809XA Striking against other object with subsequent fall, initial encounter: Secondary | ICD-10-CM | POA: Insufficient documentation

## 2013-09-08 DIAGNOSIS — Y9389 Activity, other specified: Secondary | ICD-10-CM | POA: Insufficient documentation

## 2013-09-08 DIAGNOSIS — Z992 Dependence on renal dialysis: Secondary | ICD-10-CM | POA: Insufficient documentation

## 2013-09-08 DIAGNOSIS — I120 Hypertensive chronic kidney disease with stage 5 chronic kidney disease or end stage renal disease: Secondary | ICD-10-CM | POA: Insufficient documentation

## 2013-09-08 DIAGNOSIS — K219 Gastro-esophageal reflux disease without esophagitis: Secondary | ICD-10-CM | POA: Insufficient documentation

## 2013-09-08 DIAGNOSIS — IMO0002 Reserved for concepts with insufficient information to code with codable children: Secondary | ICD-10-CM

## 2013-09-08 DIAGNOSIS — Z95818 Presence of other cardiac implants and grafts: Secondary | ICD-10-CM | POA: Insufficient documentation

## 2013-09-08 DIAGNOSIS — E785 Hyperlipidemia, unspecified: Secondary | ICD-10-CM | POA: Insufficient documentation

## 2013-09-08 DIAGNOSIS — G8929 Other chronic pain: Secondary | ICD-10-CM | POA: Insufficient documentation

## 2013-09-08 DIAGNOSIS — Z8619 Personal history of other infectious and parasitic diseases: Secondary | ICD-10-CM | POA: Insufficient documentation

## 2013-09-08 MED ORDER — HYDROCODONE-ACETAMINOPHEN 5-325 MG PO TABS
1.0000 | ORAL_TABLET | Freq: Once | ORAL | Status: AC
  Administered 2013-09-08: 1 via ORAL
  Filled 2013-09-08: qty 1

## 2013-09-08 MED ORDER — CARBAMIDE PEROXIDE 6.5 % OT SOLN: 5.0000 [drp] | Freq: Two times a day (BID) | OTIC | Status: DC

## 2013-09-08 MED ORDER — HYDROCODONE-ACETAMINOPHEN 5-325 MG PO TABS: 1.0000 | ORAL_TABLET | ORAL | Status: DC | PRN

## 2013-09-08 NOTE — ED Notes (Signed)
Pt coming from [facility] assisted [facility]. Pt was bent over when he fell, pt hit his head onto drawer. Pt denies LOC. Denies new pain, c/o chronic neck pain. Facility wanted pt sent for further evaluation. BP 100/80, HR 68. Pt is dialysis pt, goes MWF but this week went [place] [place] and will go again Friday. Pt reports he did go Tuesday.

## 2013-09-08 NOTE — ED Notes (Signed)
Pt is in assisted living at [facility]-- fell against dresser there.regularly walks with a cane, prosthetic left leg for [facility].

## 2013-09-08 NOTE — ED Provider Notes (Signed)
CSN: [id number]     Arrival date & time 09/08/13  1011 History   First MD Initiated Contact with Patient 09/08/13 1016     Chief Complaint  Patient presents with  . Fall   (Consider location/radiation/quality/duration/timing/severity/associated sxs/prior Treatment) HPI Comments: Patient is 72 year old male from [facility], he presents to the ED after leaning forward to open a bottom dresser drawer and then loosing his balance (he has a right BKA) and falling forward striking his right forehead on the dresser then landing on his bottom.  He denies LOC but reports right frontal headache, wtihout blurring of vision, nausea or vomiting.  He reports a history of chronic right sided neck pain without radiation and states this has been exacerbated by the fall.  He denies radiation of the pain, weakness, numbness or tlngling, loss of control of bladder or bowel.    Patient is a 72 y.o. male presenting with fall. The history is provided by the patient. No language interpreter was used.  Fall This is a new problem. The current episode started today. The problem occurs rarely. The problem has been unchanged. Associated symptoms include headaches and neck pain. Pertinent negatives include no abdominal pain, arthralgias, chest pain, congestion, coughing, fever, joint swelling, nausea, numbness, sore throat, urinary symptoms, visual change, vomiting or weakness. The symptoms are aggravated by bending. He has tried nothing for the symptoms. The treatment provided no relief.    Past Medical History  Diagnosis Date  . Dialysis patient   . Peripheral vascular disease   . ESRD (end stage renal disease) on dialysis 09/2010    MWF  . Hypertension   . Hyperlipidemia   . Arthritis     Gout  . Paroxysmal atrial fibrillation   . Brain tumor 2009  . Seizure disorder 2009  . Anemia   . Hyperparathyroidism, secondary   . Diabetes mellitus without complication     Type II, diet controlled  .  Proteus mirabilis infection   . CHF (congestive heart failure)     grade 1 DD, preserved EF, [facility] records  . 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 brain tumor  . GERD (gastroesophageal reflux disease)   . Hx of cardiac cath     a. LHC (9/14):  Normal cors, EF 65%.    Past Surgical History  Procedure Laterality Date  . Av fistula placement Left     arm  . 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 Eye surgery Bilateral 2000    cataract  . Retinopathy surgery      Hx. of laser treatments for diabetics   Family History  Problem Relation Age of Onset  . Seizures Mother   . Stroke Mother 66  . Hypertension Mother   . Alcohol abuse Father   . Hypertension Father   . Arthritis Sister   . Heart attack Neg Hx   . Heart failure Neg Hx    History  Substance Use Topics  . Smoking status: Never Smoker   . Smokeless tobacco: Never Used  . Alcohol Use: No    Review of Systems  Constitutional: Negative for fever.  HENT: Negative for congestion and sore throat.   Respiratory: Negative for cough.   Cardiovascular: Negative for chest pain.  Gastrointestinal: Negative for nausea, vomiting and abdominal pain.  Musculoskeletal: Positive for neck pain. Negative for arthralgias and joint  swelling.  Neurological: Positive for headaches. Negative for weakness and numbness.  All other systems reviewed and are negative.    Allergies  Review of patient's allergies indicates no known allergies.  Home Medications   Current Outpatient Rx  Name  Route  Sig  Dispense  Refill  . aspirin EC 325 MG tablet   Oral   Take 325 mg by mouth daily.         Marland Kitchen atorvastatin (LIPITOR) 40 MG tablet   Oral   Take 1 tablet (40 mg total) by mouth daily.   30 tablet   0   . B Complex-C-Folic Acid (RENO CAPS) 1 MG CAPS   Oral   Take 1 mg by mouth every evening.          . divalproex (DEPAKOTE ER)  500 MG 24 hr tablet   Oral   Take 3 tablets (1,500 mg total) by mouth daily.   90 tablet   5   . docusate sodium (COLACE) 100 MG capsule   Oral   Take 100 mg by mouth 2 (two) times daily as needed for mild constipation.         Marland Kitchen escitalopram (LEXAPRO) 5 MG tablet   Oral   Take 10 mg by mouth daily.          Marland Kitchen gabapentin (NEURONTIN) 100 MG capsule   Oral   Take 1 capsule by mouth at bedtime.         . metoprolol tartrate (LOPRESSOR) 25 MG tablet   Oral   Take 6.25 mg by mouth 2 (two) times daily. Take 6.25mg  by mouth daily         . nitroGLYCERIN (NITROSTAT) 0.4 MG SL tablet   Sublingual   Place 1 tablet (0.4 mg total) under the tongue every 5 (five) minutes x 3 doses as needed for chest pain.   30 tablet   12   . omeprazole (PRILOSEC OTC) 20 MG tablet   Oral   Take 20 mg by mouth daily as needed (for heartburn).         . sevelamer carbonate (RENVELA) 800 MG tablet   Oral   Take 3,200 mg by mouth 3 (three) times daily with meals.         . carbamide peroxide (DEBROX) 6.5 % otic solution   Left Ear   Place 5 drops into the left ear 2 (two) times daily.   15 mL   0   . HYDROcodone-acetaminophen (NORCO/VICODIN) 5-325 MG per tablet   Oral   Take 1 tablet by mouth every 4 (four) hours as needed for moderate pain.   30 tablet   0    BP 157/73  Pulse 63  Temp(Src) 98.1 F (36.7 C) (Oral)  Resp 20  SpO2 98% Physical Exam  Nursing note and vitals reviewed. Constitutional: He is oriented to person, place, and time. He appears well-developed and well-nourished. No distress.  HENT:  Head: Normocephalic.  Right Ear: External ear normal.  Nose: Nose normal.  Mouth/Throat: Oropharynx is clear and moist. No oropharyngeal exudate.  Tenderness to palpation of right frontal scalp, well healed craniotomy scar, left ear with cerumen impaction  Eyes: Conjunctivae are normal. Pupils are equal, round, and reactive to light. No scleral icterus.  Neck: Normal range  of motion. Neck supple. Spinous process tenderness and muscular tenderness present.    Cardiovascular: Normal rate, regular rhythm and normal heart sounds.  Exam reveals no gallop and no friction rub.  No murmur heard. Pulmonary/Chest: Effort normal and breath sounds normal. No respiratory distress. He has no wheezes. He has no rales. He exhibits no tenderness.  Abdominal: Soft. Bowel sounds are normal. He exhibits no distension. There is no tenderness. There is no rebound and no guarding.  Musculoskeletal: Normal range of motion. He exhibits no edema and no tenderness.       Thoracic back: He exhibits normal range of motion and no tenderness.       Lumbar back: He exhibits normal range of motion and no tenderness.  Lymphadenopathy:    He has no cervical adenopathy.  Neurological: He is alert and oriented to person, place, and time. He exhibits normal muscle tone. Coordination normal.  Skin: Skin is warm and dry. No rash noted. No erythema. No pallor.  Psychiatric: He has a normal mood and affect. His behavior is normal. Judgment and thought content normal.    ED Course  Procedures (including critical care time) Labs Review Labs Reviewed - No data to display Imaging Review Ct Head Wo Contrast  09/08/2013   CLINICAL DATA:  Status post fall. History of chronic neck pain. History of dialysis dependent renal failure  EXAM: CT HEAD WITHOUT CONTRAST  CT CERVICAL SPINE WITHOUT CONTRAST  TECHNIQUE: Multidetector CT imaging of the head and cervical spine was performed following the standard protocol without intravenous contrast. Multiplanar CT image reconstructions of the cervical spine were also generated.  COMPARISON:  CT scan of the brain dated June 19, 2013.  FINDINGS: CT HEAD FINDINGS  There is mild diffuse cerebral and cerebellar atrophy with compensatory ventriculomegaly which is stable. There is stable right temporal encephalomalacia with chronic post craniotomy changes. There is no shift  of the midline. There is no evidence of an acute intracranial hemorrhage. There are no findings to suggest an evolving ischemic infarction. There are no abnormal intracranial calcifications.  At bone window settings there is no evidence of an acute skull fracture. The observed portions of the paranasal sinuses and mastoid air cells are clear.  CT CERVICAL SPINE FINDINGS  The cervical vertebral bodies are preserved in height. There degenerative disc changes centered at C5-6 was milder changes at C6-7 and C4-5 and C3-4. Anterior bridging and near bridging osteophytes are demonstrated. The prevertebral soft tissue spaces appear normal. There is no evidence of a perched facet or of a facet nor spinous process fracture. The odontoid is intact and the lateral masses of C1 align normally with those of C2. The bony ring at each cervical level is intact. The observed portions of the 1st and 2nd ribs appear normal. The pulmonary apices exhibit no acute abnormalities.  IMPRESSION: 1. There is no evidence of an acute intracranial hemorrhage nor of an evolving ischemic infarction. There is encephalomalacia of the right temporal lobe with evidence of previous right temporal craniectomy. 2. There is no evidence of an acute cervical spine fracture nor dislocation. There are mild degenerative disc changes present as described.   Electronically Signed   By: David  [place]   On: 09/08/2013 11:50   Ct Cervical Spine Wo Contrast  09/08/2013   CLINICAL DATA:  Status post fall. History of chronic neck pain. History of dialysis dependent renal failure  EXAM: CT HEAD WITHOUT CONTRAST  CT CERVICAL SPINE WITHOUT CONTRAST  TECHNIQUE: Multidetector CT imaging of the head and cervical spine was performed following the standard protocol without intravenous contrast. Multiplanar CT image reconstructions of the cervical spine were also generated.  COMPARISON:  CT scan of the  brain dated June 19, 2013.  FINDINGS: CT HEAD FINDINGS  There is mild  diffuse cerebral and cerebellar atrophy with compensatory ventriculomegaly which is stable. There is stable right temporal encephalomalacia with chronic post craniotomy changes. There is no shift of the midline. There is no evidence of an acute intracranial hemorrhage. There are no findings to suggest an evolving ischemic infarction. There are no abnormal intracranial calcifications.  At bone window settings there is no evidence of an acute skull fracture. The observed portions of the paranasal sinuses and mastoid air cells are clear.  CT CERVICAL SPINE FINDINGS  The cervical vertebral bodies are preserved in height. There degenerative disc changes centered at C5-6 was milder changes at C6-7 and C4-5 and C3-4. Anterior bridging and near bridging osteophytes are demonstrated. The prevertebral soft tissue spaces appear normal. There is no evidence of a perched facet or of a facet nor spinous process fracture. The odontoid is intact and the lateral masses of C1 align normally with those of C2. The bony ring at each cervical level is intact. The observed portions of the 1st and 2nd ribs appear normal. The pulmonary apices exhibit no acute abnormalities.  IMPRESSION: 1. There is no evidence of an acute intracranial hemorrhage nor of an evolving ischemic infarction. There is encephalomalacia of the right temporal lobe with evidence of previous right temporal craniectomy. 2. There is no evidence of an acute cervical spine fracture nor dislocation. There are mild degenerative disc changes present as described.   Electronically Signed   By: David  [place]   On: 09/08/2013 11:50    EKG Interpretation   None      Results for orders placed in visit on 08/16/13  VALPROIC ACID LEVEL      Result Value Range   Valproic Acid Lvl 63  50 - 100 ug/mL   Ct Head Wo Contrast  09/08/2013   CLINICAL DATA:  Status post fall. History of chronic neck pain. History of dialysis dependent renal failure  EXAM: CT HEAD WITHOUT  CONTRAST  CT CERVICAL SPINE WITHOUT CONTRAST  TECHNIQUE: Multidetector CT imaging of the head and cervical spine was performed following the standard protocol without intravenous contrast. Multiplanar CT image reconstructions of the cervical spine were also generated.  COMPARISON:  CT scan of the brain dated June 19, 2013.  FINDINGS: CT HEAD FINDINGS  There is mild diffuse cerebral and cerebellar atrophy with compensatory ventriculomegaly which is stable. There is stable right temporal encephalomalacia with chronic post craniotomy changes. There is no shift of the midline. There is no evidence of an acute intracranial hemorrhage. There are no findings to suggest an evolving ischemic infarction. There are no abnormal intracranial calcifications.  At bone window settings there is no evidence of an acute skull fracture. The observed portions of the paranasal sinuses and mastoid air cells are clear.  CT CERVICAL SPINE FINDINGS  The cervical vertebral bodies are preserved in height. There degenerative disc changes centered at C5-6 was milder changes at C6-7 and C4-5 and C3-4. Anterior bridging and near bridging osteophytes are demonstrated. The prevertebral soft tissue spaces appear normal. There is no evidence of a perched facet or of a facet nor spinous process fracture. The odontoid is intact and the lateral masses of C1 align normally with those of C2. The bony ring at each cervical level is intact. The observed portions of the 1st and 2nd ribs appear normal. The pulmonary apices exhibit no acute abnormalities.  IMPRESSION: 1. There is no evidence of an  acute intracranial hemorrhage nor of an evolving ischemic infarction. There is encephalomalacia of the right temporal lobe with evidence of previous right temporal craniectomy. 2. There is no evidence of an acute cervical spine fracture nor dislocation. There are mild degenerative disc changes present as described.   Electronically Signed   By: David  [place]   On:  09/08/2013 11:50   Ct Cervical Spine Wo Contrast  09/08/2013   CLINICAL DATA:  Status post fall. History of chronic neck pain. History of dialysis dependent renal failure  EXAM: CT HEAD WITHOUT CONTRAST  CT CERVICAL SPINE WITHOUT CONTRAST  TECHNIQUE: Multidetector CT imaging of the head and cervical spine was performed following the standard protocol without intravenous contrast. Multiplanar CT image reconstructions of the cervical spine were also generated.  COMPARISON:  CT scan of the brain dated June 19, 2013.  FINDINGS: CT HEAD FINDINGS  There is mild diffuse cerebral and cerebellar atrophy with compensatory ventriculomegaly which is stable. There is stable right temporal encephalomalacia with chronic post craniotomy changes. There is no shift of the midline. There is no evidence of an acute intracranial hemorrhage. There are no findings to suggest an evolving ischemic infarction. There are no abnormal intracranial calcifications.  At bone window settings there is no evidence of an acute skull fracture. The observed portions of the paranasal sinuses and mastoid air cells are clear.  CT CERVICAL SPINE FINDINGS  The cervical vertebral bodies are preserved in height. There degenerative disc changes centered at C5-6 was milder changes at C6-7 and C4-5 and C3-4. Anterior bridging and near bridging osteophytes are demonstrated. The prevertebral soft tissue spaces appear normal. There is no evidence of a perched facet or of a facet nor spinous process fracture. The odontoid is intact and the lateral masses of C1 align normally with those of C2. The bony ring at each cervical level is intact. The observed portions of the 1st and 2nd ribs appear normal. The pulmonary apices exhibit no acute abnormalities.  IMPRESSION: 1. There is no evidence of an acute intracranial hemorrhage nor of an evolving ischemic infarction. There is encephalomalacia of the right temporal lobe with evidence of previous right temporal  craniectomy. 2. There is no evidence of an acute cervical spine fracture nor dislocation. There are mild degenerative disc changes present as described.   Electronically Signed   By: David  [place]   On: 09/08/2013 11:50    Date: 09/08/2013  Rate: 69  Rhythm:sinus arrythmia  QRS Axis: 32  Intervals: PR 205, QT 408, QTc 438  ST/T Wave abnormalities: Early repole  Conduction Disutrbances:prolonged PRI  Narrative Interpretation: Sinus arrythmia with borderline prolonged PR, early repole  Old EKG Reviewed: No acute changes   MDM  Minor head injury DDD Left ear cerumen impaction.  Patient with history of craniotomy for tumor presents to the ED s/p mechanical fall where he lost his balance and struck his head, no evidence of head bleed, no fractures, has ESRD on dialysis but is compliant with medication and dialysis, no evidence to suggest syncope.   Izola Price Marisue Humble, PA-C 09/08/13 1210  Scarlette Calico C. Marisue Humble, PA-C 09/08/13 1218

## 2013-09-09 NOTE — ED Provider Notes (Signed)
Medical screening examination/treatment/procedure(s) were conducted as a shared visit with non-physician practitioner(s) and myself.  I personally evaluated the patient during the encounter.  Lost balance and hit head on dresser. No LOC. CN 2-12 intact, no ataxia on finger to nose, no nystagmus, 5/5 strength throughout, no pronator drift, Romberg negative, normal gait.   EKG Interpretation   None        Glynn Octave, MD 09/09/13 1452

## 2013-09-21 ENCOUNTER — Telehealth: Payer: Self-pay | Admitting: Diagnostic Neuroimaging

## 2013-09-21 NOTE — Telephone Encounter (Signed)
Called to offer earlier appt. No answer. Left vmail as requested.

## 2013-09-21 NOTE — Telephone Encounter (Signed)
Ria Comment from [facility] calling to make appointment for patient, during dialysis patient was told he needs to be seen sooner than June appointment with Dr. Leta Baptist because his shaking is getting worse. Please call to schedule.

## 2013-09-21 NOTE — Telephone Encounter (Signed)
Patient has been sched/confirmed for sooner appt.

## 2013-09-22 ENCOUNTER — Encounter: Payer: Self-pay | Admitting: Diagnostic Neuroimaging

## 2013-09-22 ENCOUNTER — Encounter (INDEPENDENT_AMBULATORY_CARE_PROVIDER_SITE_OTHER): Payer: Self-pay

## 2013-09-22 ENCOUNTER — Ambulatory Visit (INDEPENDENT_AMBULATORY_CARE_PROVIDER_SITE_OTHER): Payer: Medicare Other | Admitting: Diagnostic Neuroimaging

## 2013-09-22 VITALS — BP 132/84 | HR 77 | Ht 76.0 in | Wt 265.0 lb

## 2013-09-22 DIAGNOSIS — R251 Tremor, unspecified: Secondary | ICD-10-CM

## 2013-09-22 DIAGNOSIS — R259 Unspecified abnormal involuntary movements: Secondary | ICD-10-CM

## 2013-09-22 MED ORDER — CARBIDOPA-LEVODOPA 25-100 MG PO TABS: 0.5000 | ORAL_TABLET | Freq: Three times a day (TID) | ORAL | Status: DC

## 2013-09-22 NOTE — Progress Notes (Signed)
PATIENT: Cristina Gong DOB: 1940/12/29   REASON FOR VISIT: follow up for complex partial seizures HISTORY FROM: patient  HISTORY OF PRESENT ILLNESS:  UPDATE 09/22/13: Since last visit, has started to develop action and postural, occ resting, tremor in right > left hand. He discussed with renal doctor who recommended follow up here. No seizures.  UPDATE 08/16/13 (LL): Since last visit, patient was having chest pain and had Left heart Cardiac catheterization 05/27/13 which demonstrated normal coronary arteries.  He was later taken to the emergency room for evaluation of possible TIA or stroke on 06/19/13. Patient had intermittent episodes of shaking of his extremities on day of admission. During one of those episodes he had a 15 minute period of slurred speech which was noticed by his wife. Symptoms lasted about 15 minutes then resolved with no recurrence, even though he's had recurrent episodes of shaking of his extremities.  Stroke workup was negative.  His valproic acid level was low and he was increased to divalproex ER 1522m daily for seizure control.  Just started rehab last week at his assisted living facility, twice a week for walking/balance.  Looking back over the last year retrospectively patient feels like discord between his wife and himself caused many of his physical complaints.  He has no seizures or other shaking/ neuro symptoms since leaving the hospital. They are living apart now and he feels the best he has felt in a long time, he states.  UPDATE 03/30/13 (VP): Since last visit, dilantin level was very low, so I started divalproex ER 10042mdaily, and then tapered dilantin off. He is doing much better now. No further seizures. He has some periodic limb movements of sleep type kicks at night. No day time seizures. Short term memory still affected. Also went to ER in June for fever, found to have UTI.   UPDATE 02/09/13 (VP): Since last visit, still having 3-4 episodes per week, with  slurred speech, random jerking movements, but cannot talk, but able to hear and stay awake. Episodes do not seem to be correlated with dialysis timing. Patient did not bring his medicines for me to review. Is not sure about his Dilantin dosing, but he thinks he takes 50 mg tablets, 10 tablets every morning. [facility], which shows stable right temporal post surgical changes, and is stable from MRI on 01/07/11. He did not see them in clinic.   Also, I received prior records, which I summarize as follows:  Patient had 2 focal seizures in January 2009. These initially consisted of feeling off balance, disorientation, difficulty speaking, left-sided numbness. 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 [facility]. He underwent right temporal lobe tumor resection (11/09/07, [facility] demonstrated oligo[facility]rocytoma. [facility] Additional pathology studies: Ki-67 4%, MGMT 25% (MGMT promoter methylation negative), 1p intact, 19q loss. No further radiation or chemotherapy was suggested. Followup MRIs were recommended over time. He follow up in 2010 multiple times with stable MRIs, then [facility]. Last 2 MRIs were 01/07/11 and 11/10/12, which have been stable. Has not been back to [facility] for clinic evaluation since 07/15/12.   PRIOR HPI (10/22/12): [age]ear old right-handed male with history of diabetes, paroxysmal atrial fibrillation, gout, end-stage renal disease on hemodialysis, depression, anxiety, brain tumor (  patient not sure what type) here for evaluation of slurred speech trouble talking, trouble swallowing and ringing in the ears. Patient is here with his wife.  Patient previously lived in [place], treated at  [facility] for a brain tumor. He points to the right frontal region of his head. Is not sure if this was a benign or malignant tumor. Patient states that he was having left leg weakness problems, diagnosed with a brain tumor. Apparently this was treated at [facility] with biopsy and ultimately resection. He does not recall chemotherapy or radiation treatments. He has had followup scans and visits at [facility] with neurosurgery and neurology but does not know with whom. Last MRI scan was in April 2012. Last visit with neurologist was 4 months ago.  Patient also reports history of seizures, around the time of diagnosis of brain tumor. Patient describes seizures as episodes where he is unable to talk, foaming at the mouth, but awake, aware, able to hear. No convulsions. Patient has been treated with Dilantin. He reports taking 10 tablets of Dilantin the morning and 5 tablets at night. This is contradictory to his referring notes where he is apparently on 100 mg 3 times a day. Patient is not sure of the strength of his tablets.   REVIEW OF SYSTEMS: Full 14 system review of systems performed and notable only for memory loss dizziness headache numbness tremors is urination constipation depression testing ringing in ears neck pain neck stiffness light sensitivity no vision loss of vision eye pain.  ALLERGIES: No Known Allergies  HOME MEDICATIONS: Outpatient Prescriptions Prior to Visit  Medication Sig Dispense Refill  . aspirin EC 325 MG tablet Take 325 mg by mouth daily.      Marland Kitchen atorvastatin (LIPITOR) 40 MG tablet Take 1 tablet (40 mg total) by mouth daily.  30 tablet  0  . divalproex (DEPAKOTE ER) 500 MG 24 hr tablet Take 3 tablets (1,500 mg total) by mouth daily.  90 tablet  5  . docusate sodium (COLACE) 100 MG capsule Take 100 mg by mouth 2 (two) times daily as needed for mild constipation.      Marland Kitchen escitalopram (LEXAPRO) 5 MG tablet Take 10 mg by mouth daily.       Marland Kitchen gabapentin (NEURONTIN) 100 MG capsule Take 1  capsule by mouth at bedtime.      Marland Kitchen HYDROcodone-acetaminophen (NORCO/VICODIN) 5-325 MG per tablet Take 1 tablet by mouth every 4 (four) hours as needed for moderate pain.  30 tablet  0  . metoprolol tartrate (LOPRESSOR) 25 MG tablet Take 6.25 mg by mouth 2 (two) times daily. Take 6.30m by mouth daily      . nitroGLYCERIN (NITROSTAT) 0.4 MG SL tablet Place 1 tablet (0.4 mg total) under the tongue every 5 (five) minutes x 3 doses as needed for chest pain.  30 tablet  12  . omeprazole (PRILOSEC OTC) 20 MG tablet Take 20 mg by mouth daily as needed (for heartburn).      . sevelamer carbonate (RENVELA) 800 MG tablet Take 3,200 mg by mouth 3 (three) times daily with meals.      . B Complex-C-Folic Acid (RENO CAPS) 1 MG CAPS Take 1 mg by mouth every evening.       . carbamide peroxide (DEBROX) 6.5 % otic solution Place 5 drops into the left ear 2 (two) times daily.  15 mL  0   No facility-administered medications prior to visit.     PAST MEDICAL HISTORY: Past Medical  History  Diagnosis Date  . Dialysis patient   . Peripheral vascular disease   . ESRD (end stage renal disease) on dialysis 09/2010    MWF  . Hypertension   . Hyperlipidemia   . Arthritis     Gout  . Paroxysmal atrial fibrillation   . Brain tumor 2009  . Seizure disorder 2009  . Anemia   . Hyperparathyroidism, secondary   . Diabetes mellitus without complication     Type II, diet controlled  . Proteus mirabilis infection   . CHF (congestive heart failure)     grade 1 DD, preserved EF, [facility] records  . 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 brain tumor  . GERD (gastroesophageal reflux disease)   . Hx of cardiac cath     a. LHC (9/14):  Normal cors, EF 65%.     PAST SURGICAL HISTORY: Past Surgical History  Procedure Laterality Date  . Av fistula placement Left     arm  . 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 Eye  surgery Bilateral 2000    cataract  . Retinopathy surgery      Hx. of laser treatments for diabetics    FAMILY HISTORY: Family History  Problem Relation Age of Onset  . Seizures Mother   . Stroke Mother 47  . Hypertension Mother   . Alcohol abuse Father   . Hypertension Father   . Arthritis Sister   . Heart attack Neg Hx   . Heart failure Neg Hx     SOCIAL HISTORY: History   Social History  . Marital Status: Married    Spouse Name: [name]    Number of Children: 3  . Years of Education: 12th   Occupational History  .      N/A   Social History Main Topics  . Smoking status: Never Smoker   . Smokeless tobacco: Never Used  . Alcohol Use: No  . Drug Use: No  . Sexual Activity: No   Other Topics Concern  . Not on file   Social History Narrative   Pt lives at home with spouse.   Caffeine Use: 1-2 cups daily.   Uses a cane for ambulation and prosthetic limb  Left leg.       PHYSICAL EXAM  Filed Vitals:   09/22/13 1530  BP: 132/84  Pulse: 77  Height: 6' 4" (1.93 m)  Weight: 265 lb (120.203 kg)   Body mass index is 32.27 kg/(m^2).  Generalized: Well developed, in no acute distress  Head: normocephalic and atraumatic. Oropharynx benign  Neck: Supple, no carotid bruits  Cardiac: Regular rate rhythm, no murmur  Musculoskeletal: LEFT BELOW KNEE AMPUTATION.   Neurologic Exam  Mental Status: Awake, alert. Language is fluent and comprehension intact.  Cranial Nerves:  Pupils are equal and reactive to light. Visual fields are full to confrontation, EXCEPT LEFT SUPERIOR QUADRANTANOPSIA. Conjugate eye movements are full and symmetric. Facial sensation and strength are symmetric. Hearing is intact. Palate elevated symmetrically and uvula is midline. Shoulder shrug is symmetric. Tongue is midline.  Motor: ACTION > POSTURAL > RESTING TREMOR (RUE > LUE). INTERMITTENT MOUTH/CHIN TREMOR. COGWHEELING IN RUE ? LUE. MILD BRADYKINESIA IN BUE. Full strength in the upper and  lower extremities. LEFT BELOW KNEE AMPUTATION. No pronator drift.  Sensory: DECR VIB AT TOES.  Coordination: No ataxia or dysmetria on finger-nose or rapid alternating movement  testing.  Gait and Station: UNSTEADY GAIT, ambulates with cane, has left leg prosthesis. Reflexes: BUE TRACE, RIGHT KNEE TRACE, RIGHT ANKLE 0. LEFT BELOW KNEE AMPUTATION.  DIAGNOSTIC DATA (LABS, IMAGING, TESTING) - I reviewed patient records, labs, notes, testing and imaging myself where available.  Lab Results  Component Value Date   WBC 8.5 06/22/2013   HGB 10.5* 06/22/2013   HCT 30.0* 06/22/2013   MCV 90.1 06/22/2013   PLT 270 06/22/2013      Component Value Date/Time   NA 133* 06/22/2013 1341   NA 138 02/09/2013 1350   K 4.4 06/22/2013 1341   CL 91* 06/22/2013 1341   CO2 24 06/22/2013 1341   GLUCOSE 194* 06/22/2013 1341   GLUCOSE 137* 02/09/2013 1350   BUN 88* 06/22/2013 1341   BUN 25 02/09/2013 1350   CREATININE 13.86* 06/22/2013 1341   CALCIUM 8.9 06/22/2013 1341   PROT 6.8 05/26/2013 1310   PROT 6.8 02/09/2013 1350   ALBUMIN 3.1* 06/22/2013 1341   AST 13 05/26/2013 1310   ALT 11 05/26/2013 1310   ALKPHOS 77 05/26/2013 1310   BILITOT 0.3 05/26/2013 1310   GFRNONAA 3* 06/22/2013 1341   GFRAA 4* 06/22/2013 1341   Lab Results  Component Value Date   CHOL 161 06/20/2013   HDL 65 06/20/2013   LDLCALC 65 06/20/2013   TRIG 155* 06/20/2013   CHOLHDL 2.5 06/20/2013   Lab Results  Component Value Date   HGBA1C 6.9* 06/19/2013   Lab Results  Component Value Date   PHENYTOIN 3.1* 02/15/2013   VALPROATE 63 08/16/2013   03/17/13 EEG - moderate diffuse slowing with intermittent frontal rhythmic activity (FIRDA) consistent with moderate encephalopathy. No focal or epileptiform activity is seen. No electrographic seizures are recorded.   ASSESSMENT AND PLAN 73 y.o. male with intermittent slurrred speech, jerking movements, ringing in ears, could be partial seizures. Much better controlled with divalproex than with dilantin. 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 Continue divalproex ER 1580m daily for seizure control  2. No driving for now  3. Check valproic acid level and TSH 4. Trial of carb/levo for tremor control  Orders Placed This Encounter  Procedures  . Valproic acid level  . TSH   Return in about 3 months (around 12/21/2013) for with LCharlott Holleror Penumalli.   VPenni Bombard MD 07/22/1095 40:45PM Certified in Neurology, Neurophysiology and Neuroimaging  [facility] [address] [place]  [phone]

## 2013-09-30 ENCOUNTER — Encounter (HOSPITAL_COMMUNITY): Payer: Self-pay | Admitting: Emergency Medicine

## 2013-09-30 ENCOUNTER — Emergency Department (HOSPITAL_COMMUNITY): Payer: Medicare Other

## 2013-09-30 ENCOUNTER — Emergency Department (HOSPITAL_COMMUNITY)
Admission: EM | Admit: 2013-09-30 | Discharge: 2013-09-30 | Disposition: A | Discharge status: Home / Self Care | Payer: Medicare Other | Attending: Emergency Medicine | Admitting: Emergency Medicine

## 2013-09-30 DIAGNOSIS — Z79899 Other long term (current) drug therapy: Secondary | ICD-10-CM | POA: Insufficient documentation

## 2013-09-30 DIAGNOSIS — Z862 Personal history of diseases of the blood and blood-forming organs and certain disorders involving the immune mechanism: Secondary | ICD-10-CM | POA: Insufficient documentation

## 2013-09-30 DIAGNOSIS — I120 Hypertensive chronic kidney disease with stage 5 chronic kidney disease or end stage renal disease: Secondary | ICD-10-CM | POA: Insufficient documentation

## 2013-09-30 DIAGNOSIS — M129 Arthropathy, unspecified: Secondary | ICD-10-CM | POA: Insufficient documentation

## 2013-09-30 DIAGNOSIS — Z95818 Presence of other cardiac implants and grafts: Secondary | ICD-10-CM | POA: Insufficient documentation

## 2013-09-30 DIAGNOSIS — S88119A Complete traumatic amputation at level between knee and ankle, unspecified lower leg, initial encounter: Secondary | ICD-10-CM | POA: Insufficient documentation

## 2013-09-30 DIAGNOSIS — K219 Gastro-esophageal reflux disease without esophagitis: Secondary | ICD-10-CM | POA: Insufficient documentation

## 2013-09-30 DIAGNOSIS — Z8619 Personal history of other infectious and parasitic diseases: Secondary | ICD-10-CM | POA: Insufficient documentation

## 2013-09-30 DIAGNOSIS — R079 Chest pain, unspecified: Secondary | ICD-10-CM | POA: Insufficient documentation

## 2013-09-30 DIAGNOSIS — N186 End stage renal disease: Secondary | ICD-10-CM | POA: Insufficient documentation

## 2013-09-30 DIAGNOSIS — Z7982 Long term (current) use of aspirin: Secondary | ICD-10-CM | POA: Insufficient documentation

## 2013-09-30 DIAGNOSIS — I509 Heart failure, unspecified: Secondary | ICD-10-CM | POA: Insufficient documentation

## 2013-09-30 DIAGNOSIS — G40909 Epilepsy, unspecified, not intractable, without status epilepticus: Secondary | ICD-10-CM | POA: Insufficient documentation

## 2013-09-30 DIAGNOSIS — E785 Hyperlipidemia, unspecified: Secondary | ICD-10-CM | POA: Insufficient documentation

## 2013-09-30 DIAGNOSIS — R0789 Other chest pain: Secondary | ICD-10-CM

## 2013-09-30 DIAGNOSIS — Z86011 Personal history of benign neoplasm of the brain: Secondary | ICD-10-CM | POA: Insufficient documentation

## 2013-09-30 DIAGNOSIS — Z992 Dependence on renal dialysis: Secondary | ICD-10-CM | POA: Insufficient documentation

## 2013-09-30 DIAGNOSIS — Z859 Personal history of malignant neoplasm, unspecified: Secondary | ICD-10-CM | POA: Insufficient documentation

## 2013-09-30 LAB — CBC
HEMATOCRIT: 30.3 % — AB (ref 39.0–52.0)
HEMOGLOBIN: 10 g/dL — AB (ref 13.0–17.0)
MCH: 32.3 pg (ref 26.0–34.0)
MCHC: 33 g/dL (ref 30.0–36.0)
MCV: 97.7 fL (ref 78.0–100.0)
Platelets: 228 10*3/uL (ref 150–400)
RBC: 3.1 MIL/uL — ABNORMAL LOW (ref 4.22–5.81)
RDW: 14.2 % (ref 11.5–15.5)
WBC: 6.7 10*3/uL (ref 4.0–10.5)

## 2013-09-30 LAB — BASIC METABOLIC PANEL
BUN: 21 mg/dL (ref 6–23)
CHLORIDE: 99 meq/L (ref 96–112)
CO2: 30 mEq/L (ref 19–32)
Calcium: 9 mg/dL (ref 8.4–10.5)
Creatinine, Ser: 4.83 mg/dL — ABNORMAL HIGH (ref 0.50–1.35)
GFR calc non Af Amer: 11 mL/min — ABNORMAL LOW (ref >90)
GFR, EST AFRICAN AMERICAN: 13 mL/min — AB (ref >90)
GLUCOSE: 127 mg/dL — AB (ref 70–99)
POTASSIUM: 3.7 meq/L (ref 3.7–5.3)
Sodium: 142 mEq/L (ref 137–147)

## 2013-09-30 LAB — POCT I-STAT TROPONIN I
TROPONIN I, POC: 0.02 ng/mL (ref 0.00–0.08)
Troponin i, poc: 0.02 ng/mL (ref 0.00–0.08)

## 2013-09-30 MED ORDER — NITROGLYCERIN 0.4 MG SL SUBL: 0.4000 mg | SUBLINGUAL_TABLET | SUBLINGUAL | Status: DC | PRN

## 2013-09-30 MED ORDER — ASPIRIN 81 MG PO CHEW
324.0000 mg | CHEWABLE_TABLET | Freq: Once | ORAL | Status: AC
  Administered 2013-09-30: 324 mg via ORAL
  Filled 2013-09-30: qty 4

## 2013-09-30 NOTE — ED Provider Notes (Signed)
CSN: [id number]     Arrival date & time 09/30/13  1554 History   First MD Initiated Contact with Patient 09/30/13 1556     Chief Complaint  Patient presents with  . Chest Pain   (Consider location/radiation/quality/duration/timing/severity/associated sxs/prior Treatment) HPI Comments: Patient is a 73 year old male with a past medical history of ESRD on dialysis, hyperlipidemia, diabetes, hypertension, seizures and right BKA who presents from dialysis when he started to have chest pain. Patient reports sudden onset of a "jolting pressure" located in his left chest and does not radiate. Patient reports the pain was intermittent for about 5 minutes and then resolved. Patient's dialysis was stopped and he was sent to the ED. Patient denies any associated symptoms. No aggravating/alleviating factors. Patient sees [facility]. He had a heart catheterization in September 2014 which showed no evidence of CAD.   Patient is a 73 y.o. male presenting with chest pain.  Chest Pain Associated symptoms: no abdominal pain, no dizziness, no dysphagia, no fatigue, no fever, no nausea, no palpitations, no shortness of breath, not vomiting and no weakness     Past Medical History  Diagnosis Date  . Dialysis patient   . Peripheral vascular disease   . ESRD (end stage renal disease) on dialysis 09/2010    MWF  . Hypertension   . Hyperlipidemia   . Arthritis     Gout  . Paroxysmal atrial fibrillation   . Brain tumor 2009  . Seizure disorder 2009  . Anemia   . Hyperparathyroidism, secondary   . Diabetes mellitus without complication     Type II, diet controlled  . Proteus mirabilis infection   . CHF (congestive heart failure)     grade 1 DD, preserved EF, [facility] records  . 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 brain tumor  . GERD (gastroesophageal reflux disease)   . Hx of cardiac cath     a. LHC (9/14):  Normal cors, EF 65%.    Past Surgical  History  Procedure Laterality Date  . Av fistula placement Left     arm  . 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 Eye surgery Bilateral 2000    cataract  . Retinopathy surgery      Hx. of laser treatments for diabetics   Family History  Problem Relation Age of Onset  . Seizures Mother   . Stroke Mother 31  . Hypertension Mother   . Alcohol abuse Father   . Hypertension Father   . Arthritis Sister   . Heart attack Neg Hx   . Heart failure Neg Hx    History  Substance Use Topics  . Smoking status: Never Smoker   . Smokeless tobacco: Never Used  . Alcohol Use: No    Review of Systems  Constitutional: Negative for fever, chills and fatigue.  HENT: Negative for trouble swallowing.   Eyes: Negative for visual disturbance.  Respiratory: Negative for shortness of breath.   Cardiovascular: Positive for chest pain. Negative for palpitations.  Gastrointestinal: Negative for nausea, vomiting, abdominal pain and diarrhea.  Genitourinary: Negative for dysuria and difficulty urinating.  Musculoskeletal: Negative for arthralgias and neck pain.  Skin: Negative for color change.  Neurological: Negative for dizziness and weakness.  Psychiatric/Behavioral: Negative for dysphoric mood.    Allergies  Review of patient's allergies indicates no known allergies.  Home Medications   Current Outpatient  Rx  Name  Route  Sig  Dispense  Refill  . aspirin EC 325 MG tablet   Oral   Take 325 mg by mouth daily.         Marland Kitchen atorvastatin (LIPITOR) 40 MG tablet   Oral   Take 1 tablet (40 mg total) by mouth daily.   30 tablet   0   . carbidopa-levodopa (SINEMET IR) 25-100 MG per tablet   Oral   Take 0.5 tablets by mouth 3 (three) times daily.   90 tablet   12   . divalproex (DEPAKOTE ER) 500 MG 24 hr tablet   Oral   Take 3 tablets (1,500 mg total) by mouth daily.   90 tablet   5   . docusate sodium (COLACE) 100 MG capsule    Oral   Take 100 mg by mouth 2 (two) times daily as needed for mild constipation.         Marland Kitchen escitalopram (LEXAPRO) 5 MG tablet   Oral   Take 10 mg by mouth daily.          Marland Kitchen gabapentin (NEURONTIN) 100 MG capsule   Oral   Take 1 capsule by mouth at bedtime.         Marland Kitchen HYDROcodone-acetaminophen (NORCO/VICODIN) 5-325 MG per tablet   Oral   Take 1 tablet by mouth every 4 (four) hours as needed for moderate pain.   30 tablet   0   . metoprolol tartrate (LOPRESSOR) 25 MG tablet   Oral   Take 6.25 mg by mouth 2 (two) times daily. Take 6.25mg  by mouth daily         . nitroGLYCERIN (NITROSTAT) 0.4 MG SL tablet   Sublingual   Place 1 tablet (0.4 mg total) under the tongue every 5 (five) minutes x 3 doses as needed for chest pain.   30 tablet   12   . omeprazole (PRILOSEC OTC) 20 MG tablet   Oral   Take 20 mg by mouth daily as needed (for heartburn).         . sevelamer carbonate (RENVELA) 800 MG tablet   Oral   Take 3,200 mg by mouth 3 (three) times daily with meals.          BP 122/75  Pulse 70  Temp(Src) 98.6 F (37 C) (Oral)  SpO2 100% Physical Exam  Nursing note and vitals reviewed. Constitutional: He is oriented to person, place, and time. He appears well-developed and well-nourished. No distress.  HENT:  Head: Normocephalic and atraumatic.  Eyes: Conjunctivae and EOM are normal.  Neck: Normal range of motion.  Cardiovascular: Normal rate and regular rhythm.  Exam reveals no gallop and no friction rub.   No murmur heard. Pulmonary/Chest: Effort normal and breath sounds normal. He has no wheezes. He has no rales. He exhibits no tenderness.  Abdominal: Soft. He exhibits no distension. There is no tenderness. There is no rebound.  Musculoskeletal: Normal range of motion.  Right BKA  Neurological: He is alert and oriented to person, place, and time. Coordination normal.  Speech is goal-oriented. Moves limbs without ataxia.   Skin: Skin is warm and dry.   Psychiatric: He has a normal mood and affect. His behavior is normal.    ED Course  Procedures (including critical care time) Labs Review Labs Reviewed  CBC - Abnormal; Notable for the following:    RBC 3.10 (*)    Hemoglobin 10.0 (*)    HCT 30.3 (*)  All other components within normal limits  BASIC METABOLIC PANEL - Abnormal; Notable for the following:    Glucose, Bld 127 (*)    Creatinine, Ser 4.83 (*)    GFR calc non Af Amer 11 (*)    GFR calc Af Amer 13 (*)    All other components within normal limits  POCT I-STAT TROPONIN I  POCT I-STAT TROPONIN I   Imaging Review Dg Chest 2 View  09/30/2013   CLINICAL DATA:  CHEST PAIN  EXAM: CHEST  2 VIEW  COMPARISON:  DG CHEST 2 VIEW dated 06/19/2013; DG CHEST 2 VIEW dated 05/26/2013  FINDINGS: The heart size and mediastinal contours are within normal limits. Both lungs are clear. The visualized skeletal structures are unremarkable.  IMPRESSION: No active cardiopulmonary disease.   Electronically Signed   By: Kathreen Devoid   On: 09/30/2013 16:47    EKG Interpretation   None       MDM   1. Non-cardiac chest pain     4:09 PM Labs, chest xray and EKG pending. Vitals stable and patient afebrile. Patient will have ASA and nitro here.   8:47 PM Labs unremarkable for acute changes. Delta troponin negative. Chest xray shows no acute changes. Patient reports to have intermittent chest pain episodes since being in the ED. Patient had a clean coronary cath in September 2014. I spoke with Dr. Sung Amabile who is on call for [facility] who states the patient's pain is non cardiac due to clean cath. Patient will be discharged with recommended follow up with PCP. Patient instructed to return with worsening or concerning symptoms.    Alvina Chou, [place] 09/30/13 2049

## 2013-09-30 NOTE — ED Notes (Signed)
Per EMS: Pt from dialysis when he started to experience left sided CP; denies N/v/diaphoresis. Pt reports that he has had this intermittent CP "for weeks." Denies CP at this time. NAD. AO x4. 118/60. 80 SR. 100% RA. Pt did not complete dialysis today.

## 2013-09-30 NOTE — ED Provider Notes (Signed)
  I performed a history and physical examination of [name] [name] and discussed his management with Ms. Barnet Glasgow.  I agree with the history, physical, assessment, and plan of care, with the following exceptions: None  I was present for the following procedures: None Time Spent in Critical Care of the patient: None Time spent in discussions with the patient and family: 5  72 y.o. Male with atypical chest pain on dialysis.  EKG nonischemic.  Cardiology consulted and cleared given normal cad on cath in September.   Virgina Evener, MD 09/30/13 [phone]

## 2013-09-30 NOTE — ED Notes (Signed)
Pt states that he felt another episode of brief chest pain lasting for a few seconds, similar in nature to previous episodes that he experienced earlier today during dialysis

## 2013-09-30 NOTE — ED Notes (Signed)
Patient is currently not having chest pain, but describes a pain that comes intermittently and briefly.  Twice occuring in dialysis today that he describes as a jolt of sharp pain in his central chest that startled him.  Each episode lasted for seconds.  States that he has has another episode since his arrival here.  Denies nausea, sob, dizziness, or radiating pain.  Currently denies chest pain.

## 2013-09-30 NOTE — ED Notes (Signed)
Patient experienced another brief episode of chest pain similar to previous episodes mentioned.  Still is short in duration, currently is denying chest pain.

## 2013-09-30 NOTE — ED Notes (Signed)
Pt states he gets intermittent chest pain, Bary Richard and Ray MD made aware. Both states to discharge pt, that pain might not be heart related according to cardiology. Pt informed.

## 2013-09-30 NOTE — Discharge Instructions (Signed)
Your tests here are negative. Refer to attached documents for more information. Follow up with your Primary Care Doctor. Return to the ED with worsening or concerning symptoms.

## 2013-10-05 ENCOUNTER — Ambulatory Visit: Payer: Medicare Other | Admitting: Diagnostic Neuroimaging

## 2013-10-06 ENCOUNTER — Ambulatory Visit: Payer: Medicare Other | Admitting: Diagnostic Neuroimaging

## 2013-11-01 ENCOUNTER — Ambulatory Visit: Payer: Self-pay | Admitting: Diagnostic Neuroimaging

## 2013-12-16 ENCOUNTER — Inpatient Hospital Stay (HOSPITAL_COMMUNITY)
Admission: EM | Admit: 2013-12-16 | Discharge: 2013-12-19 | DRG: 391 | Disposition: A | Discharge status: Nursing Home (Includes ICF) | Payer: Medicare Other | Attending: Internal Medicine | Admitting: Internal Medicine

## 2013-12-16 ENCOUNTER — Encounter (HOSPITAL_COMMUNITY): Payer: Self-pay | Admitting: Emergency Medicine

## 2013-12-16 DIAGNOSIS — I10 Essential (primary) hypertension: Secondary | ICD-10-CM

## 2013-12-16 DIAGNOSIS — N2581 Secondary hyperparathyroidism of renal origin: Secondary | ICD-10-CM | POA: Diagnosis present

## 2013-12-16 DIAGNOSIS — I639 Cerebral infarction, unspecified: Secondary | ICD-10-CM

## 2013-12-16 DIAGNOSIS — R0789 Other chest pain: Secondary | ICD-10-CM

## 2013-12-16 DIAGNOSIS — G40209 Localization-related (focal) (partial) symptomatic epilepsy and epileptic syndromes with complex partial seizures, not intractable, without status epilepticus: Secondary | ICD-10-CM

## 2013-12-16 DIAGNOSIS — Z7982 Long term (current) use of aspirin: Secondary | ICD-10-CM

## 2013-12-16 DIAGNOSIS — I120 Hypertensive chronic kidney disease with stage 5 chronic kidney disease or end stage renal disease: Secondary | ICD-10-CM | POA: Diagnosis present

## 2013-12-16 DIAGNOSIS — I480 Paroxysmal atrial fibrillation: Secondary | ICD-10-CM

## 2013-12-16 DIAGNOSIS — Z992 Dependence on renal dialysis: Secondary | ICD-10-CM

## 2013-12-16 DIAGNOSIS — Z79899 Other long term (current) drug therapy: Secondary | ICD-10-CM

## 2013-12-16 DIAGNOSIS — I70269 Atherosclerosis of native arteries of extremities with gangrene, unspecified extremity: Secondary | ICD-10-CM

## 2013-12-16 DIAGNOSIS — R4701 Aphasia: Secondary | ICD-10-CM

## 2013-12-16 DIAGNOSIS — M109 Gout, unspecified: Secondary | ICD-10-CM | POA: Diagnosis present

## 2013-12-16 DIAGNOSIS — I739 Peripheral vascular disease, unspecified: Secondary | ICD-10-CM | POA: Diagnosis present

## 2013-12-16 DIAGNOSIS — S88119A Complete traumatic amputation at level between knee and ankle, unspecified lower leg, initial encounter: Secondary | ICD-10-CM

## 2013-12-16 DIAGNOSIS — C712 Malignant neoplasm of temporal lobe: Secondary | ICD-10-CM

## 2013-12-16 DIAGNOSIS — I509 Heart failure, unspecified: Secondary | ICD-10-CM | POA: Diagnosis present

## 2013-12-16 DIAGNOSIS — N186 End stage renal disease: Secondary | ICD-10-CM

## 2013-12-16 DIAGNOSIS — M949 Disorder of cartilage, unspecified: Secondary | ICD-10-CM

## 2013-12-16 DIAGNOSIS — G40909 Epilepsy, unspecified, not intractable, without status epilepticus: Secondary | ICD-10-CM

## 2013-12-16 DIAGNOSIS — IMO0001 Reserved for inherently not codable concepts without codable children: Secondary | ICD-10-CM

## 2013-12-16 DIAGNOSIS — K219 Gastro-esophageal reflux disease without esophagitis: Secondary | ICD-10-CM

## 2013-12-16 DIAGNOSIS — E119 Type 2 diabetes mellitus without complications: Secondary | ICD-10-CM

## 2013-12-16 DIAGNOSIS — M899 Disorder of bone, unspecified: Secondary | ICD-10-CM | POA: Diagnosis present

## 2013-12-16 DIAGNOSIS — D649 Anemia, unspecified: Secondary | ICD-10-CM | POA: Diagnosis present

## 2013-12-16 DIAGNOSIS — C719 Malignant neoplasm of brain, unspecified: Secondary | ICD-10-CM

## 2013-12-16 DIAGNOSIS — I4891 Unspecified atrial fibrillation: Secondary | ICD-10-CM | POA: Diagnosis present

## 2013-12-16 DIAGNOSIS — Z531 Procedure and treatment not carried out because of patient's decision for reasons of belief and group pressure: Secondary | ICD-10-CM

## 2013-12-16 DIAGNOSIS — F3289 Other specified depressive episodes: Secondary | ICD-10-CM | POA: Diagnosis present

## 2013-12-16 DIAGNOSIS — A088 Other specified intestinal infections: Principal | ICD-10-CM | POA: Diagnosis present

## 2013-12-16 DIAGNOSIS — R197 Diarrhea, unspecified: Secondary | ICD-10-CM

## 2013-12-16 DIAGNOSIS — N19 Unspecified kidney failure: Secondary | ICD-10-CM

## 2013-12-16 DIAGNOSIS — E785 Hyperlipidemia, unspecified: Secondary | ICD-10-CM

## 2013-12-16 DIAGNOSIS — I200 Unstable angina: Secondary | ICD-10-CM

## 2013-12-16 DIAGNOSIS — F329 Major depressive disorder, single episode, unspecified: Secondary | ICD-10-CM | POA: Diagnosis present

## 2013-12-16 DIAGNOSIS — N390 Urinary tract infection, site not specified: Secondary | ICD-10-CM

## 2013-12-16 DIAGNOSIS — R6883 Chills (without fever): Secondary | ICD-10-CM

## 2013-12-16 LAB — COMPREHENSIVE METABOLIC PANEL WITH GFR
ALT: <5 U/L (ref 0–53)
AST: 13 U/L (ref 0–37)
Albumin: 3.1 g/dL — ABNORMAL LOW (ref 3.5–5.2)
Alkaline Phosphatase: 54 U/L (ref 39–117)
BUN: 114 mg/dL — ABNORMAL HIGH (ref 6–23)
CO2: 20 meq/L (ref 19–32)
Calcium: 9.4 mg/dL (ref 8.4–10.5)
Chloride: 96 meq/L (ref 96–112)
Creatinine, Ser: 15.77 mg/dL — ABNORMAL HIGH (ref 0.50–1.35)
GFR calc Af Amer: 3 mL/min — ABNORMAL LOW
GFR calc non Af Amer: 3 mL/min — ABNORMAL LOW
Glucose, Bld: 82 mg/dL (ref 70–99)
Potassium: 4.5 meq/L (ref 3.7–5.3)
Sodium: 142 meq/L (ref 137–147)
Total Bilirubin: 0.3 mg/dL (ref 0.3–1.2)
Total Protein: 6.9 g/dL (ref 6.0–8.3)

## 2013-12-16 LAB — CBC
HEMATOCRIT: 26.8 % — AB (ref 39.0–52.0)
Hemoglobin: 9.3 g/dL — ABNORMAL LOW (ref 13.0–17.0)
MCH: 32.3 pg (ref 26.0–34.0)
MCHC: 34.7 g/dL (ref 30.0–36.0)
MCV: 93.1 fL (ref 78.0–100.0)
Platelets: 238 10*3/uL (ref 150–400)
RBC: 2.88 MIL/uL — ABNORMAL LOW (ref 4.22–5.81)
RDW: 12.9 % (ref 11.5–15.5)
WBC: 8.6 10*3/uL (ref 4.0–10.5)

## 2013-12-16 LAB — I-STAT CG4 LACTIC ACID, ED: LACTIC ACID, VENOUS: 0.86 mmol/L (ref 0.5–2.2)

## 2013-12-16 LAB — GLUCOSE, CAPILLARY: Glucose-Capillary: 122 mg/dL — ABNORMAL HIGH (ref 70–99)

## 2013-12-16 MED ORDER — ACETAMINOPHEN 325 MG PO TABS
650.0000 mg | ORAL_TABLET | Freq: Four times a day (QID) | ORAL | Status: DC | PRN
  Administered 2013-12-17: 650 mg via ORAL
  Filled 2013-12-16: qty 2

## 2013-12-16 MED ORDER — METOPROLOL TARTRATE 12.5 MG HALF TABLET
12.5000 mg | ORAL_TABLET | Freq: Every day | ORAL | Status: DC
  Administered 2013-12-16 – 2013-12-18 (×3): 12.5 mg via ORAL
  Filled 2013-12-16 (×4): qty 1

## 2013-12-16 MED ORDER — DIVALPROEX SODIUM 500 MG PO DR TAB
1500.0000 mg | DELAYED_RELEASE_TABLET | Freq: Every day | ORAL | Status: DC
  Administered 2013-12-16 – 2013-12-19 (×4): 1500 mg via ORAL
  Filled 2013-12-16 (×4): qty 3

## 2013-12-16 MED ORDER — ESCITALOPRAM OXALATE 10 MG PO TABS
10.0000 mg | ORAL_TABLET | Freq: Every day | ORAL | Status: DC
  Administered 2013-12-17 – 2013-12-19 (×3): 10 mg via ORAL
  Filled 2013-12-16 (×3): qty 1

## 2013-12-16 MED ORDER — RENO CAPS 1 MG PO CAPS: 1.0000 mg | ORAL_CAPSULE | Freq: Every evening | ORAL | Status: DC

## 2013-12-16 MED ORDER — PRO-STAT SUGAR FREE PO LIQD
30.0000 mL | Freq: Every day | ORAL | Status: DC
  Administered 2013-12-17 – 2013-12-19 (×3): 30 mL via ORAL
  Filled 2013-12-16 (×4): qty 30

## 2013-12-16 MED ORDER — ATORVASTATIN CALCIUM 10 MG PO TABS
30.0000 mg | ORAL_TABLET | Freq: Every day | ORAL | Status: DC
  Administered 2013-12-16 – 2013-12-18 (×4): 30 mg via ORAL
  Filled 2013-12-16 (×5): qty 1

## 2013-12-16 MED ORDER — DARBEPOETIN ALFA-POLYSORBATE 100 MCG/0.5ML IJ SOLN
100.0000 ug | INTRAMUSCULAR | Status: DC
  Administered 2013-12-16: 100 ug via INTRAVENOUS
  Filled 2013-12-16: qty 0.5

## 2013-12-16 MED ORDER — HEPARIN SODIUM (PORCINE) 5000 UNIT/ML IJ SOLN
5000.0000 [IU] | Freq: Three times a day (TID) | INTRAMUSCULAR | Status: DC
  Administered 2013-12-16 – 2013-12-19 (×9): 5000 [IU] via SUBCUTANEOUS
  Filled 2013-12-16 (×11): qty 1

## 2013-12-16 MED ORDER — DARBEPOETIN ALFA-POLYSORBATE 100 MCG/0.5ML IJ SOLN
INTRAMUSCULAR | Status: AC
  Administered 2013-12-16: 100 ug via INTRAVENOUS
  Filled 2013-12-16: qty 0.5

## 2013-12-16 MED ORDER — RENA-VITE PO TABS
1.0000 | ORAL_TABLET | Freq: Every day | ORAL | Status: DC
  Administered 2013-12-17: 1 via ORAL
  Filled 2013-12-16 (×4): qty 1

## 2013-12-16 MED ORDER — ACETAMINOPHEN 650 MG RE SUPP: 650.0000 mg | Freq: Four times a day (QID) | RECTAL | Status: DC | PRN

## 2013-12-16 MED ORDER — GABAPENTIN 100 MG PO CAPS
100.0000 mg | ORAL_CAPSULE | Freq: Every day | ORAL | Status: DC
  Administered 2013-12-17 – 2013-12-18 (×3): 100 mg via ORAL
  Filled 2013-12-16 (×5): qty 1

## 2013-12-16 MED ORDER — ONDANSETRON HCL 4 MG PO TABS: 4.0000 mg | ORAL_TABLET | Freq: Four times a day (QID) | ORAL | Status: DC | PRN

## 2013-12-16 MED ORDER — CARBIDOPA-LEVODOPA 25-100 MG PO TABS
0.5000 | ORAL_TABLET | Freq: Three times a day (TID) | ORAL | Status: DC
  Administered 2013-12-17 – 2013-12-19 (×8): 0.5 via ORAL
  Filled 2013-12-16 (×11): qty 0.5

## 2013-12-16 MED ORDER — INSULIN ASPART 100 UNIT/ML ~~LOC~~ SOLN
0.0000 [IU] | Freq: Three times a day (TID) | SUBCUTANEOUS | Status: DC
  Administered 2013-12-17: 2 [IU] via SUBCUTANEOUS

## 2013-12-16 MED ORDER — DOCUSATE SODIUM 100 MG PO CAPS
100.0000 mg | ORAL_CAPSULE | Freq: Every day | ORAL | Status: DC
  Administered 2013-12-17 – 2013-12-18 (×3): 100 mg via ORAL
  Filled 2013-12-16 (×5): qty 1

## 2013-12-16 MED ORDER — ASPIRIN EC 325 MG PO TBEC
325.0000 mg | DELAYED_RELEASE_TABLET | Freq: Every day | ORAL | Status: DC
  Administered 2013-12-17 – 2013-12-19 (×3): 325 mg via ORAL
  Filled 2013-12-16 (×4): qty 1

## 2013-12-16 MED ORDER — RENA-VITE PO TABS
1.0000 | ORAL_TABLET | Freq: Every day | ORAL | Status: DC
  Administered 2013-12-17: 01:00:00 via ORAL
  Administered 2013-12-18: 1 via ORAL
  Filled 2013-12-16 (×5): qty 1

## 2013-12-16 MED ORDER — ONDANSETRON HCL 4 MG/2ML IJ SOLN: 4.0000 mg | Freq: Four times a day (QID) | INTRAMUSCULAR | Status: DC | PRN

## 2013-12-16 MED ORDER — HYDROCODONE-ACETAMINOPHEN 5-325 MG PO TABS: 1.0000 | ORAL_TABLET | ORAL | Status: DC | PRN

## 2013-12-16 MED ORDER — SEVELAMER CARBONATE 800 MG PO TABS
3200.0000 mg | ORAL_TABLET | Freq: Three times a day (TID) | ORAL | Status: DC
  Administered 2013-12-17 – 2013-12-19 (×7): 3200 mg via ORAL
  Filled 2013-12-16 (×11): qty 4

## 2013-12-16 NOTE — Consult Note (Signed)
[facility] Renal Consultation Note    Indication for Consultation:  Management of ESRD/hemodialysis; anemia, hypertension/volume and secondary hyperparathyroidism  HPI: [name] [name] is a pleasant  73 y.o. male ESRD patient (TTS HD) with multiple medical issues who presented to the ED with complaints of diarrhea x 5 days which was also accompanied by nausea and vomiting early on.  He is a resident of [facility] which has been experiencing an outbreak of GI sickness. Residents there were not transported to dialysis on Tuesday of this week due to a quarantine, however, he missed Thursday as well.  Although there is one confirmed case of C. Diff at the facility, the etiology of the outbreak may be attributable to norovirus per reports from the outpatient dialysis center.  He is afebrile with normal white count. Potassium is 4.5 with BUN of 115. Stool samples are still pending. He has no complaints.  Past Medical History  Diagnosis Date  . Dialysis patient   . Peripheral vascular disease   . ESRD (end stage renal disease) on dialysis 09/2010    MWF  . Hypertension   . Hyperlipidemia   . Arthritis     Gout  . Paroxysmal atrial fibrillation   . Brain tumor 2009  . Seizure disorder 2009  . Anemia   . Hyperparathyroidism, secondary   . Diabetes mellitus without complication     Type II, diet controlled  . Proteus mirabilis infection   . CHF (congestive heart failure)     grade 1 DD, preserved EF, [facility] records  . 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 brain tumor  . GERD (gastroesophageal reflux disease)   . Hx of cardiac cath     a. LHC (9/14):  Normal cors, EF 65%.    Past Surgical History  Procedure Laterality Date  . Av fistula placement Left     arm  . 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 Eye surgery Bilateral 2000    cataract  . Retinopathy surgery       Hx. of laser treatments for diabetics   Family History  Problem Relation Age of Onset  . Seizures Mother   . Stroke Mother 39  . Hypertension Mother   . Alcohol abuse Father   . Hypertension Father   . Arthritis Sister   . Heart attack Neg Hx   . Heart failure Neg Hx    Social History:  reports that he has never smoked. He has never used smokeless tobacco. He reports that he drinks about 0.6 ounces of alcohol per week. He reports that he does not use illicit drugs. No Known Allergies Prior to Admission medications   Medication Sig Start Date End Date Taking? Authorizing Provider  acetaminophen (TYLENOL) 325 MG tablet Take 325 mg by mouth every 6 (six) hours as needed for moderate pain or fever.   Yes Historical Provider, MD  aspirin EC 325 MG tablet Take 325 mg by mouth daily.   Yes Historical Provider, MD  atorvastatin (LIPITOR) 20 MG tablet Take 30 mg by mouth at bedtime.   Yes Historical Provider, MD  B Complex-C-Folic Acid (RENO CAPS) 1 MG CAPS Take 1 mg by mouth every evening. 8pm   Yes Historical Provider, MD  carbidopa-levodopa (SINEMET IR) 25-100 MG per tablet Take 0.5 tablets by mouth 3 (three) times daily.   Yes Historical Provider, MD  divalproex (DEPAKOTE)  500 MG DR tablet Take 1,500 mg by mouth daily.   Yes Historical Provider, MD  docusate sodium (COLACE) 100 MG capsule Take 100 mg by mouth at bedtime.    Yes Historical Provider, MD  escitalopram (LEXAPRO) 10 MG tablet Take 10 mg by mouth daily.   Yes Historical Provider, MD  gabapentin (NEURONTIN) 100 MG capsule Take 1 capsule by mouth at bedtime. 01/21/13  Yes Historical Provider, MD  HYDROcodone-acetaminophen (NORCO/VICODIN) 5-325 MG per tablet Take 1 tablet by mouth every 4 (four) hours as needed for moderate pain.   Yes Historical Provider, MD  metoprolol tartrate (LOPRESSOR) 25 MG tablet Take 6.25 mg by mouth 2 (two) times daily. Take 6.25mg  by mouth daily   Yes Historical Provider, MD  sevelamer carbonate (RENVELA)  800 MG tablet Take 3,200 mg by mouth 3 (three) times daily with meals.   Yes Historical Provider, MD  nitroGLYCERIN (NITROSTAT) 0.4 MG SL tablet Place 1 tablet (0.4 mg total) under the tongue every 5 (five) minutes x 3 doses as needed for chest pain. 05/28/13   Nita Sells, MD  omeprazole (PRILOSEC OTC) 20 MG tablet Take 20 mg by mouth daily as needed (heartburn).     Historical Provider, MD   No current facility-administered medications for this encounter.   Current Outpatient Prescriptions  Medication Sig Dispense Refill  . acetaminophen (TYLENOL) 325 MG tablet Take 325 mg by mouth every 6 (six) hours as needed for moderate pain or fever.      Marland Kitchen aspirin EC 325 MG tablet Take 325 mg by mouth daily.      Marland Kitchen atorvastatin (LIPITOR) 20 MG tablet Take 30 mg by mouth at bedtime.      . B Complex-C-Folic Acid (RENO CAPS) 1 MG CAPS Take 1 mg by mouth every evening. 8pm      . carbidopa-levodopa (SINEMET IR) 25-100 MG per tablet Take 0.5 tablets by mouth 3 (three) times daily.      . divalproex (DEPAKOTE) 500 MG DR tablet Take 1,500 mg by mouth daily.      Marland Kitchen docusate sodium (COLACE) 100 MG capsule Take 100 mg by mouth at bedtime.       Marland Kitchen escitalopram (LEXAPRO) 10 MG tablet Take 10 mg by mouth daily.      Marland Kitchen gabapentin (NEURONTIN) 100 MG capsule Take 1 capsule by mouth at bedtime.      Marland Kitchen HYDROcodone-acetaminophen (NORCO/VICODIN) 5-325 MG per tablet Take 1 tablet by mouth every 4 (four) hours as needed for moderate pain.      . metoprolol tartrate (LOPRESSOR) 25 MG tablet Take 6.25 mg by mouth 2 (two) times daily. Take 6.25mg  by mouth daily      . sevelamer carbonate (RENVELA) 800 MG tablet Take 3,200 mg by mouth 3 (three) times daily with meals.      . nitroGLYCERIN (NITROSTAT) 0.4 MG SL tablet Place 1 tablet (0.4 mg total) under the tongue every 5 (five) minutes x 3 doses as needed for chest pain.  30 tablet  12  . omeprazole (PRILOSEC OTC) 20 MG tablet Take 20 mg by mouth daily as needed  (heartburn).        Labs: Basic Metabolic Panel:  Recent Labs Lab 12/16/13 1153  NA 142  K 4.5  CL 96  CO2 20  GLUCOSE 82  BUN 114*  CREATININE 15.77*  CALCIUM 9.4   Liver Function Tests:  Recent Labs Lab 12/16/13 1153  AST 13  ALT <5  ALKPHOS 54  BILITOT 0.3  PROT 6.9  ALBUMIN 3.1*   CBC:  Recent Labs Lab 12/16/13 1153  WBC 8.6  HGB 9.3*  HCT 26.8*  MCV 93.1  PLT 238   Studies/Results: No results found.  ROS: + diarrhea/  10 Pt ROS asked and answered. All systems negative except as above.   Physical Exam: Filed Vitals:   12/16/13 1143 12/16/13 1145 12/16/13 1200 12/16/13 1215  BP:  136/72 143/70 128/68  Pulse:  65 73 72  Temp: 98 F (36.7 C)     TempSrc: Oral     Resp:  13 10 18   Height:      Weight:      SpO2:  97% 97% 99%     General: Well developed, well nourished, in no acute distress. Head: Normocephalic, atraumatic, sclera non-icteric, mucus membranes are moist Neck: Supple. JVD not elevated. Lungs: Clear bilaterally to auscultation without wheezes, rales, or rhonchi. Breathing is unlabored. Heart: RRR with S1 S2. No murmurs, rubs, or gallops appreciated. Abdomen: Soft, non-tender, non-distended with normoactive bowel sounds. No rebound/guarding. No obvious abdominal masses. M-S:  Strength and tone appear normal for age. Lower extremities: L BKA, stump is without ulcerations. No LE edema.   Neuro: Alert and oriented X 3. Moves all extremities spontaneously. Psych:  Responds to questions appropriately with a normal affect. Dialysis Access: LUA AVF + bruit  Dialysis Orders: Center: [facility]  on TTS . EDW 118 HD Bath 2K/2Ca  Time 4:30 Heparin 8000. Access L AVF BFR 350 DFR 800    Hectorol 0 mcg IV/HD Epogen 1200   Units IV/HD  Venofer  0  Recent labs: Hgb 10.5, Tsat 18%, Ferr 1237, PTH 90, Phos 6.2  Assessment/Plan: 1. Diarrhea - ?? C. Diff vs norovirus. Stool studies pending. Admission per hospitalist service 2. ESRD -  TTS, last HD was  3/28.  Plan HD today, abbreviated tx with low BFR, then again tomorrow to get back on sched. 3. Hypertension/volume  - SBPs 120s-140s. On lopressor 6.25 mg BID at home.  Up 2kg by wgts.  No overt excess. Try for 2L today 4. Anemia  - Hgb 9.3 < 10.5 op. Last Tsat 18% with Ferr > 1200.  No op Fe. Aranesp 100 for here. Follow CBC. 5. Metabolic bone disease -  Corr Ca 10.1. Last Phos 6.2. PTH 90. Continue 2Ca bath. Resume Renvela 4 ac once eating. No op Vit D.  6. Nutrition - Alb 3.1. Renal diet when appropriate. Multivit, prostat. 7. Hx Afib - no anticoagulation 8. Seizure disorder - on Depakote at home  Sonnie Alamo, PA-C [facility] Pager [phone] 12/16/2013, 2:07 PM

## 2013-12-16 NOTE — ED Notes (Signed)
Patient actually has dialysis 3x a week, (Tues, Thurs, Sat).  Has not had dialysis since Saturday,  March 28th

## 2013-12-16 NOTE — ED Notes (Signed)
Unable to collect blood specimens from IV, phlebotomy notified

## 2013-12-16 NOTE — ED Provider Notes (Signed)
CSN: [id number]     Arrival date & time 12/16/13  1109 History   First MD Initiated Contact with Patient 12/16/13 1115     Chief Complaint  Patient presents with  . Nausea    from facility that is on isolation for C-diff  . Emesis    last emesis 2 days ago  . Diarrhea     (Consider location/radiation/quality/duration/timing/severity/associated sxs/prior Treatment) HPI Comments: I spoke with the Development worker, international aid at [facility], [place]. Numerous residents have had a 24-hr GI bug. He had similar symptoms for about 24 hours, then persisted with diarrhea. One resident at the nursing home at confirmed C. Diff.  He has not been allowed to go to dialysis for past 2 sessions due to his diarrhea.   Patient is a 73 y.o. male presenting with vomiting and diarrhea. The history is provided by the patient.  Emesis Severity:  Mild Timing:  Sporadic Number of daily episodes:  Twice - 5 days ago Quality:  Stomach contents Able to tolerate:  Solids and liquids Progression:  Resolved Chronicity:  New Associated symptoms: diarrhea   Associated symptoms: no abdominal pain   Diarrhea:    Quality:  Watery   Number of occurrences:  5-6   Severity:  Moderate   Duration:  5 days   Timing:  Constant   Progression:  Unchanged Risk factors: sick contacts (multiple residents of his nursing home are sick)   Risk factors: no alcohol use, no diabetes, not pregnant now and no suspect food intake   Diarrhea Associated symptoms: vomiting (twice, a few days ago)   Associated symptoms: no abdominal pain and no fever     Past Medical History  Diagnosis Date  . Dialysis patient   . Peripheral vascular disease   . ESRD (end stage renal disease) on dialysis 09/2010    MWF  . Hypertension   . Hyperlipidemia   . Arthritis     Gout  . Paroxysmal atrial fibrillation   . Brain tumor 2009  . Seizure disorder 2009  . Anemia   . Hyperparathyroidism, secondary   . Diabetes mellitus without complication    Type II, diet controlled  . Proteus mirabilis infection   . CHF (congestive heart failure)     grade 1 DD, preserved EF, [facility] records  . 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 brain tumor  . GERD (gastroesophageal reflux disease)   . Hx of cardiac cath     a. LHC (9/14):  Normal cors, EF 65%.    Past Surgical History  Procedure Laterality Date  . Av fistula placement Left     arm  . 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 Eye surgery Bilateral 2000    cataract  . Retinopathy surgery      Hx. of laser treatments for diabetics   Family History  Problem Relation Age of Onset  . Seizures Mother   . Stroke Mother 52  . Hypertension Mother   . Alcohol abuse Father   . Hypertension Father   . Arthritis Sister   . Heart attack Neg Hx   . Heart failure Neg Hx    History  Substance Use Topics  . Smoking status: Never Smoker   . Smokeless tobacco: Never Used  . Alcohol Use: 0.6 oz/week    1 Shots of liquor per week    Review of Systems  Constitutional: Negative for fever.  Respiratory: Negative for cough and shortness of breath.   Cardiovascular: Negative for chest pain.  Gastrointestinal: Positive for vomiting (twice, a few days ago) and diarrhea. Negative for abdominal pain.  Neurological: Positive for weakness. Negative for dizziness.  All other systems reviewed and are negative.      Allergies  Review of patient's allergies indicates no known allergies.  Home Medications   Current Outpatient Rx  Name  Route  Sig  Dispense  Refill  . aspirin EC 325 MG tablet   Oral   Take 325 mg by mouth daily.         Marland Kitchen atorvastatin (LIPITOR) 40 MG tablet   Oral   Take 40 mg by mouth at bedtime.         . diclofenac sodium (VOLTAREN) 1 % GEL   Topical   Apply 2 g topically 3 (three) times daily as needed. For pain         . divalproex (DEPAKOTE ER) 500 MG 24 hr  tablet   Oral   Take 3 tablets (1,500 mg total) by mouth daily.   90 tablet   5   . docusate sodium (COLACE) 100 MG capsule   Oral   Take 100 mg by mouth daily.          Marland Kitchen escitalopram (LEXAPRO) 5 MG tablet   Oral   Take 10 mg by mouth daily.          Marland Kitchen gabapentin (NEURONTIN) 100 MG capsule   Oral   Take 1 capsule by mouth at bedtime.         . isosorbide dinitrate (ISORDIL) 20 MG tablet   Oral   Take 20 mg by mouth daily.         . metoprolol tartrate (LOPRESSOR) 25 MG tablet   Oral   Take 12.5 mg by mouth 2 (two) times daily. Take 6.25mg  by mouth daily         . nitroGLYCERIN (NITROSTAT) 0.4 MG SL tablet   Sublingual   Place 1 tablet (0.4 mg total) under the tongue every 5 (five) minutes x 3 doses as needed for chest pain.   30 tablet   12   . omeprazole (PRILOSEC OTC) 20 MG tablet   Oral   Take 20 mg by mouth 2 (two) times daily.          . polyethylene glycol (MIRALAX / GLYCOLAX) packet   Oral   Take 17 g by mouth daily.         . sevelamer carbonate (RENVELA) 800 MG tablet   Oral   Take 3,200 mg by mouth 3 (three) times daily with meals.         . sorbitol 70 % solution   Oral   Take 30 mLs by mouth 4 (four) times daily as needed. For constipation          BP 144/67  Pulse 72  Resp 18  Ht 6\' 2"  (1.88 m)  Wt 265 lb (120.203 kg)  BMI 34.01 kg/m2  SpO2 98% Physical Exam  Nursing note and vitals reviewed. Constitutional: He is oriented to person, place, and time. He appears well-developed and well-nourished. No distress.  HENT:  Head: Normocephalic and atraumatic.  Mouth/Throat: Oropharynx is clear and moist. No oropharyngeal exudate.  Eyes: EOM are normal. Pupils are equal, round, and reactive to light.  Neck: Normal range of motion. Neck supple.  Cardiovascular: Normal rate and regular rhythm.  Exam  reveals no friction rub.   No murmur heard. Pulmonary/Chest: Effort normal and breath sounds normal. No respiratory distress. He has  no wheezes. He has no rales.  Abdominal: Soft. He exhibits no distension. There is no tenderness. There is no rebound.  Musculoskeletal: Normal range of motion. He exhibits no edema.  L BKA  Neurological: He is alert and oriented to person, place, and time. No cranial nerve deficit. Coordination normal.  Skin: Skin is warm. He is not diaphoretic.    ED Course  Procedures (including critical care time) Labs Review Labs Reviewed  STOOL CULTURE  CBC  COMPREHENSIVE METABOLIC PANEL  I-STAT CG4 LACTIC ACID, ED   Imaging Review No results found.   EKG Interpretation None      MDM   Final diagnoses:  Diarrhea  Uremia    44M presents with diarrhea for past 5 days. Had vomiting twice the first day. Associated loss of appetite and generalized weakness.  No abdominal pain, no fever, no dizziness, CP, SOB. Multiple other residents of [facility], where he lives, as quarantined with a similar illness.  Here, vitals stable, exam benign. With his persistent diarrhea, will send stool studies, give small amount of fluids. Will check labs as he's not been to dialysis for his past 2 sessions. Labs show uremia. Dr. Posey Pronto with nephrology unable to arrange dialysis today due to his quarantine status. Dr. Posey Pronto recommended admission to arrange dialysis.   Osvaldo Shipper, MD 12/16/13 [phone]

## 2013-12-16 NOTE — Procedures (Signed)
Patient seen on Hemodialysis. QB 300, UF goal 2.5L Treatment adjusted as needed.  Elmarie Shiley MD [facility]. Office # [phone] Pager # [phone] 4:03 PM

## 2013-12-16 NOTE — ED Notes (Signed)
Lactic acid results given to Dr. Mingo Amber

## 2013-12-16 NOTE — ED Notes (Signed)
Per ems: from [facility] assisted living facility, n/v/d x 5 days, loss of appetite.  Denies blood in stool or vomit.  VSS, has not vomited in 2 days but has had diarrhea 3 times in last 24 hours, very watery.  Hx of diabetes, renal failure/dialysis patient (Tues & Sat), hx of afib, NSR at the moment  Facility is on isolation/ visitor restriction for c-diff

## 2013-12-16 NOTE — H&P (Signed)
[facility] History and Physical  [name] [name] XHB:[id number] DOB: 06/14/1941 DOA: 12/16/2013   PCP: Donetta Potts, MD   Chief Complaint: Transient expressive aphasia and diarrhea  HPI:  73 year old male with a history of ESRD with dialysis on MWF, peripheral vascular disease, hypertension, seizure disorder, diet controlled diabetes mellitus, PAF, resected oligoastrocytoma presents with 3 to four-day history of diarrhea. The patient comes from [facility] where a number of residence has been having diarrhea. It has been confirmed that one of the residents had norovirus in stool, and the EDP also stated there was a confirmed case of C.diff. The patient denies any fevers, chills, chest pain, shortness breath, abdominal pain, rashes. He did have one episode of nausea and vomiting approximately 3 days prior to admission. And vomiting since that period of time. In addition, the patient also complained of a transient episode of expressive aphasia on the day prior to admission that lasted approximately 2-3 seconds. The patient denied any loss of consciousness or bowel or bladder function. He denied any focal extremity weakness. He denied any headache or visual disturbance. There is no sensory disturbance. The patient states that his loose stools seem to be decreasing over the past 2 days. He denies any hematochezia or melena. The patient's dialysis Center has not allowed the patient to go for dialysis this past week. The patient missed 2 dialysis sessions. As a result, the patient was sent to the emergency department.  In the ED, the patient was noted to be hemodynamically stable and afebrile. BMP was essentially unremarkable except serum creatinine of 15.77. Hepatic panel was unremarkable. CBC showed hemoglobin 9.3, WBC 8.6. Lactic acid was 0.6. Assessment/Plan: Diarrhea -Seems to be slowing down per the patient -Suspect Norovirus, possible Cdiff as his living facility has had  documented cases -Stool pathogen panel -C. difficile PCR -Avoid antimotility agents Transient expressive aphasia -MRI brain -Although less likely seizure, we'll obtain EEG given the patient's seizure history -Continue aspirin -06/20/2013 carotid Doppler negative for hemodynamically significant stenosis - 06/20/2013 MRI brain negative for acute infarction ESRD -Nephrology has been notified for maintenance dialysis -Metabolic bone disease treatment per nephrology Seizure disorder -Continue Depakote 1500 mg daily -Check depakote level Paroxysmal Atrial fibrillation -Rate controlled -Continue metoprolol tartrate -Continue aspirin Diabetes mellitus type 2 -Patient is "diet controlled" -Hemoglobin A1c -NovoLog sliding scale for now Depression -Continue Lexapro       Past Medical History  Diagnosis Date  . Dialysis patient   . Peripheral vascular disease   . ESRD (end stage renal disease) on dialysis 09/2010    MWF  . Hypertension   . Hyperlipidemia   . Arthritis     Gout  . Paroxysmal atrial fibrillation   . Brain tumor 2009  . Seizure disorder 2009  . Anemia   . Hyperparathyroidism, secondary   . Diabetes mellitus without complication     Type II, diet controlled  . Proteus mirabilis infection   . CHF (congestive heart failure)     grade 1 DD, preserved EF, [facility] records  . 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 brain tumor  . GERD (gastroesophageal reflux disease)   . Hx of cardiac cath     a. LHC (9/14):  Normal cors, EF 65%.    Past Surgical History  Procedure Laterality Date  . Av fistula placement Left     arm  . 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 Eye surgery Bilateral 2000    cataract  . Retinopathy surgery      Hx. of laser treatments for diabetics   Social History:  reports that he has never smoked. He has never used smokeless tobacco. He  reports that he drinks about 0.6 ounces of alcohol per week. He reports that he does not use illicit drugs.   Family History  Problem Relation Age of Onset  . Seizures Mother   . Stroke Mother 52  . Hypertension Mother   . Alcohol abuse Father   . Hypertension Father   . Arthritis Sister   . Heart attack Neg Hx   . Heart failure Neg Hx      No Known Allergies    Prior to Admission medications   Medication Sig Start Date End Date Taking? Authorizing Provider  acetaminophen (TYLENOL) 325 MG tablet Take 325 mg by mouth every 6 (six) hours as needed for moderate pain or fever.   Yes Historical Provider, MD  aspirin EC 325 MG tablet Take 325 mg by mouth daily.   Yes Historical Provider, MD  atorvastatin (LIPITOR) 20 MG tablet Take 30 mg by mouth at bedtime.   Yes Historical Provider, MD  B Complex-C-Folic Acid (RENO CAPS) 1 MG CAPS Take 1 mg by mouth every evening. 8pm   Yes Historical Provider, MD  carbidopa-levodopa (SINEMET IR) 25-100 MG per tablet Take 0.5 tablets by mouth 3 (three) times daily.   Yes Historical Provider, MD  divalproex (DEPAKOTE) 500 MG DR tablet Take 1,500 mg by mouth daily.   Yes Historical Provider, MD  docusate sodium (COLACE) 100 MG capsule Take 100 mg by mouth at bedtime.    Yes Historical Provider, MD  escitalopram (LEXAPRO) 10 MG tablet Take 10 mg by mouth daily.   Yes Historical Provider, MD  gabapentin (NEURONTIN) 100 MG capsule Take 1 capsule by mouth at bedtime. 01/21/13  Yes Historical Provider, MD  HYDROcodone-acetaminophen (NORCO/VICODIN) 5-325 MG per tablet Take 1 tablet by mouth every 4 (four) hours as needed for moderate pain.   Yes Historical Provider, MD  metoprolol tartrate (LOPRESSOR) 25 MG tablet Take 6.25 mg by mouth 2 (two) times daily. Take 6.25mg  by mouth daily   Yes Historical Provider, MD  sevelamer carbonate (RENVELA) 800 MG tablet Take 3,200 mg by mouth 3 (three) times daily with meals.   Yes Historical Provider, MD  nitroGLYCERIN  (NITROSTAT) 0.4 MG SL tablet Place 1 tablet (0.4 mg total) under the tongue every 5 (five) minutes x 3 doses as needed for chest pain. 05/28/13   Nita Sells, MD  omeprazole (PRILOSEC OTC) 20 MG tablet Take 20 mg by mouth daily as needed (heartburn).     Historical Provider, MD    Review of Systems:  Constitutional:  No weight loss, night sweats, Fevers, chills, fatigue.  Head&Eyes: No headache.  No vision loss.  No eye pain or scotoma ENT:  No Difficulty swallowing,Tooth/dental problems,Sore throat,  No ear ache, post nasal drip,  Cardio-vascular:  No chest pain, Orthopnea, PND, swelling in lower extremities,  dizziness, palpitations  GI:  No  abdominal pain, loss of appetite, hematochezia, melena, heartburn, indigestion, Resp:  No shortness of breath with exertion or at rest. No cough. No coughing up of blood .No wheezing.No chest wall deformity  Skin:  no rash or lesions.  GU:  no dysuria, change in color of urine, no urgency or frequency. No flank pain.  Musculoskeletal:  No joint pain or swelling.  No decreased range of motion. No back pain.  Psych:  No change in mood or affect. . Neurologic: No headache, no dysesthesia, no focal weakness, no vision loss. No syncope  Physical Exam: Filed Vitals:   12/16/13 1345 12/16/13 1400 12/16/13 1415 12/16/13 1430  BP: 124/68 120/71 105/65 143/74  Pulse:  71 70 73  Temp:      TempSrc:      Resp: 9 9 8 17   Height:      Weight:      SpO2:  97% 97% 100%   General:  A&O x 3, NAD, nontoxic, pleasant/cooperative Head/Eye: No conjunctival hemorrhage, no icterus, [place]/AT, No nystagmus ENT:  No icterus,  No thrush, good dentition, no pharyngeal exudate Neck:  No masses, no lymphadenpathy, no bruits CV:  RRR, no rub, no gallop, no S3 Lung:  CTAB, good air movement, no wheeze, no rhonchi Abdomen: soft/NT, +BS, nondistended, no peritoneal signs Ext: No cyanosis, No rashes, No petechiae, No lymphangitis, No edema; L-AKA stump without  erythema or drainage Neuro: CNII-XII intact, strength 4/5 in bilateral upper and lower extremities, no dysmetria  Labs on Admission:  Basic Metabolic Panel:  Recent Labs Lab 12/16/13 1153  NA 142  K 4.5  CL 96  CO2 20  GLUCOSE 82  BUN 114*  CREATININE 15.77*  CALCIUM 9.4   Liver Function Tests:  Recent Labs Lab 12/16/13 1153  AST 13  ALT <5  ALKPHOS 54  BILITOT 0.3  PROT 6.9  ALBUMIN 3.1*   No results found for this basename: LIPASE, AMYLASE,  in the last 168 hours No results found for this basename: AMMONIA,  in the last 168 hours CBC:  Recent Labs Lab 12/16/13 1153  WBC 8.6  HGB 9.3*  HCT 26.8*  MCV 93.1  PLT 238   Cardiac Enzymes: No results found for this basename: CKTOTAL, CKMB, CKMBINDEX, TROPONINI,  in the last 168 hours BNP: No components found with this basename: POCBNP,  CBG: No results found for this basename: GLUCAP,  in the last 168 hours  Radiological Exams on Admission: No results found.      Time spent:60 minutes Code Status:  FULL Family Communication:   No Family at bedside   Queen Abbett, DO  [facility] Pager [phone]  If 7PM-7AM, please contact night-coverage [email or web address] Password [email or web address] 12/16/2013, 2:58 PM

## 2013-12-16 NOTE — ED Notes (Signed)
Dr. Walden at bedside 

## 2013-12-16 NOTE — Consult Note (Signed)
I have personally seen and examined this patient and agree with the assessment/plan as outlined above by Cletus Gash PA. Plans for HD as investigations underway on the etiology of diarrhea. (Norovirus v/s C.diff). Agree with not starting empiric antibiotics for now.   Terrel Manalo K.,MD 12/16/2013 3:58 PM

## 2013-12-16 NOTE — ED Notes (Signed)
Nephrology at bedside

## 2013-12-17 ENCOUNTER — Encounter (HOSPITAL_COMMUNITY): Payer: Self-pay | Admitting: *Deleted

## 2013-12-17 DIAGNOSIS — K219 Gastro-esophageal reflux disease without esophagitis: Secondary | ICD-10-CM

## 2013-12-17 DIAGNOSIS — E119 Type 2 diabetes mellitus without complications: Secondary | ICD-10-CM

## 2013-12-17 DIAGNOSIS — I10 Essential (primary) hypertension: Secondary | ICD-10-CM

## 2013-12-17 LAB — BASIC METABOLIC PANEL
BUN: 53 mg/dL — ABNORMAL HIGH (ref 6–23)
CO2: 24 meq/L (ref 19–32)
Calcium: 8.6 mg/dL (ref 8.4–10.5)
Chloride: 95 mEq/L — ABNORMAL LOW (ref 96–112)
Creatinine, Ser: 9.48 mg/dL — ABNORMAL HIGH (ref 0.50–1.35)
GFR calc Af Amer: 6 mL/min — ABNORMAL LOW (ref >90)
GFR calc non Af Amer: 5 mL/min — ABNORMAL LOW (ref >90)
GLUCOSE: 71 mg/dL (ref 70–99)
Potassium: 3.7 mEq/L (ref 3.7–5.3)
SODIUM: 139 meq/L (ref 137–147)

## 2013-12-17 LAB — VALPROIC ACID LEVEL: Valproic Acid Lvl: 50.3 ug/mL (ref 50.0–100.0)

## 2013-12-17 LAB — CBC
HCT: 25.6 % — ABNORMAL LOW (ref 39.0–52.0)
Hemoglobin: 8.7 g/dL — ABNORMAL LOW (ref 13.0–17.0)
MCH: 31.8 pg (ref 26.0–34.0)
MCHC: 34 g/dL (ref 30.0–36.0)
MCV: 93.4 fL (ref 78.0–100.0)
Platelets: 228 10*3/uL (ref 150–400)
RBC: 2.74 MIL/uL — AB (ref 4.22–5.81)
RDW: 13 % (ref 11.5–15.5)
WBC: 6.8 10*3/uL (ref 4.0–10.5)

## 2013-12-17 LAB — RENAL FUNCTION PANEL
ALBUMIN: 2.8 g/dL — AB (ref 3.5–5.2)
BUN: 58 mg/dL — AB (ref 6–23)
CALCIUM: 9.3 mg/dL (ref 8.4–10.5)
CO2: 24 mEq/L (ref 19–32)
Chloride: 95 mEq/L — ABNORMAL LOW (ref 96–112)
Creatinine, Ser: 9.91 mg/dL — ABNORMAL HIGH (ref 0.50–1.35)
GFR calc Af Amer: 5 mL/min — ABNORMAL LOW (ref >90)
GFR calc non Af Amer: 5 mL/min — ABNORMAL LOW (ref >90)
Glucose, Bld: 94 mg/dL (ref 70–99)
PHOSPHORUS: 4.9 mg/dL — AB (ref 2.3–4.6)
POTASSIUM: 3.8 meq/L (ref 3.7–5.3)
Sodium: 139 mEq/L (ref 137–147)

## 2013-12-17 LAB — GLUCOSE, CAPILLARY
GLUCOSE-CAPILLARY: 141 mg/dL — AB (ref 70–99)
Glucose-Capillary: 69 mg/dL — ABNORMAL LOW (ref 70–99)
Glucose-Capillary: 86 mg/dL (ref 70–99)

## 2013-12-17 LAB — HEMOGLOBIN A1C
Hgb A1c MFr Bld: 5.8 % — ABNORMAL HIGH (ref <5.7)
MEAN PLASMA GLUCOSE: 120 mg/dL — AB (ref <117)

## 2013-12-17 LAB — MRSA PCR SCREENING: MRSA BY PCR: NEGATIVE

## 2013-12-17 NOTE — Procedures (Signed)
Patient seen on Hemodialysis. QB 315, UF goal 0.5L Treatment adjusted as needed.  Elmarie Shiley MD [facility]. Office # [phone] Pager # [phone] 9:17 AM

## 2013-12-17 NOTE — Progress Notes (Signed)
[facility] PROGRESS NOTE  [name] [name] ONG:[id number] DOB: 06-Jan-1941 DOA: 12/16/2013 PCP: Donetta Potts, MD  Assessment/Plan: Diarrhea  -Resolving, has had no further diarrhea today. -No sample sent for stool pathogen panel was C. difficile PCR was no further diarrhea so far. -Avoiding antimotility agents  Transient expressive aphasia  -Awaiting MRI brain  -Although less likely seizure, we'll obtain EEG given the patient's seizure history  -Continue aspirin  -06/20/2013 carotid Doppler negative for hemodynamically significant stenosis  - 06/20/2013 MRI brain negative for acute infarction  ESRD  Dialyzed today per renal, follow. Seizure disorder  -Continue Depakote 1500 mg daily  -Check depakote level  Paroxysmal Atrial fibrillation  -Rate controlled  -Continue metoprolol tartrate  -Continue aspirin  Diabetes mellitus type 2  -Patient is "diet controlled"  -Hemoglobin A1c  -NovoLog sliding scale for now  Depression  -Continue Lexapro      Code Status: Full Family Communication: None at bedside Disposition Plan: Back to SNF when medically ready   Consultants:  Renal  Procedures:  None  Antibiotics:  None  HPI/Subjective: States he has not had any further diarrhea, denies nausea vomiting and abdominal pain  Objective: Filed Vitals:   12/17/13 1756  BP: 110/61  Pulse: 75  Temp: 99.1 F (37.3 C)  Resp: 16    Intake/Output Summary (Last 24 hours) at 12/17/13 1847 Last data filed at 12/17/13 1320  Gross per 24 hour  Intake    240 ml  Output   2238 ml  Net  -1998 ml   Filed Weights   12/16/13 1944 12/17/13 0840 12/17/13 1320  Weight: 118.3 kg (260 lb 12.9 oz) 118.2 kg (260 lb 9.3 oz) 117.7 kg (259 lb 7.7 oz)    Exam:  General: alert & oriented x 3 In NAD Cardiovascular: RRR, nl S1 s2 Respiratory: CTAB Abdomen: soft +BS NT/ND, no masses palpable Extremities: No cyanosis and no edema, status post left BKA     Data  Reviewed: Basic Metabolic Panel:  Recent Labs Lab 12/16/13 1153 12/17/13 0535 12/17/13 0917  NA 142 139 139  K 4.5 3.7 3.8  CL 96 95* 95*  CO2 20 24 24   GLUCOSE 82 71 94  BUN 114* 53* 58*  CREATININE 15.77* 9.48* 9.91*  CALCIUM 9.4 8.6 9.3  PHOS  --   --  4.9*   Liver Function Tests:  Recent Labs Lab 12/16/13 1153 12/17/13 0917  AST 13  --   ALT <5  --   ALKPHOS 54  --   BILITOT 0.3  --   PROT 6.9  --   ALBUMIN 3.1* 2.8*   No results found for this basename: LIPASE, AMYLASE,  in the last 168 hours No results found for this basename: AMMONIA,  in the last 168 hours CBC:  Recent Labs Lab 12/16/13 1153 12/17/13 0535  WBC 8.6 6.8  HGB 9.3* 8.7*  HCT 26.8* 25.6*  MCV 93.1 93.4  PLT 238 228   Cardiac Enzymes: No results found for this basename: CKTOTAL, CKMB, CKMBINDEX, TROPONINI,  in the last 168 hours BNP (last 3 results)  Recent Labs  05/26/13 1700  PROBNP 699.3*   CBG:  Recent Labs Lab 12/16/13 2029 12/17/13 0801 12/17/13 1613  GLUCAP 122* 69* 141*    Recent Results (from the past 240 hour(s))  MRSA PCR SCREENING     Status: None   Collection Time    12/17/13  5:22 AM      Result Value Ref Range Status   MRSA by PCR  NEGATIVE  NEGATIVE Final   Comment:            The GeneXpert MRSA Assay (FDA     approved for NASAL specimens     only), is one component of a     comprehensive MRSA colonization     surveillance program. It is not     intended to diagnose MRSA     infection nor to guide or     monitor treatment for     MRSA infections.     Studies: No results found.  Scheduled Meds: . aspirin EC  325 mg Oral Daily  . atorvastatin  30 mg Oral QHS  . carbidopa-levodopa  0.5 tablet Oral TID  . darbepoetin (ARANESP) injection - DIALYSIS  100 mcg Intravenous Q Fri-HD  . divalproex  1,500 mg Oral Daily  . docusate sodium  100 mg Oral QHS  . escitalopram  10 mg Oral Daily  . feeding supplement (PRO-STAT SUGAR FREE 64)  30 mL Oral Q1500   . gabapentin  100 mg Oral QHS  . heparin  5,000 Units Subcutaneous 3 times per day  . insulin aspart  0-9 Units Subcutaneous TID WC  . metoprolol tartrate  12.5 mg Oral QHS  . multivitamin  1 tablet Oral QHS  . multivitamin  1 tablet Oral QHS  . sevelamer carbonate  3,200 mg Oral TID WC   Continuous Infusions:   Active Problems:   Diarrhea   Expressive aphasia    Time spent: [facility] Pager [phone]. If 7PM-7AM, please contact night-coverage at [email or web address], password [facility] 12/17/2013, 6:47 PM  LOS: 1 day

## 2013-12-17 NOTE — Progress Notes (Signed)
Patient ID: [name] [name], male   DOB: 1941-04-14, 73 y.o.   MRN: [id number]  [facility] Progress Note   Assessment/ Plan:   1. Diarrhea - None since admission- studies pending. Would DC back to SNF today as he does not meet criteria for being infectious.  2. ESRD - TTS, back on schedule today after emergent HD yesterday 3. Hypertension/volume - Low BP, UF with caution 4. Anemia - Low Hgb, continue on ESA for now. No overt losses. 5. Metabolic bone disease - Corr Ca 10.1. Last Phos 6.2. PTH 90. Continue 2Ca bath. On Renvela 4 TIDac.  6. Nutrition - Alb 3.1. Renal diet when appropriate. Multivit, prostat. 7. Hx Afib - no anticoagulation 8. Seizure disorder - on Depakote at home  Subjective:   Reports to be feeling well, no diarrhea overnight   Objective:   BP 95/52  Pulse 72  Temp(Src) 97.8 F (36.6 C) (Oral)  Resp 16  Ht 6\' 2"  (1.88 m)  Wt 118.3 kg (260 lb 12.9 oz)  BMI 33.47 kg/m2  SpO2 98%  Physical Exam: OIN:OMVEHMCNOBS resting on HD JGG:EZMOQ RRR, normal S1 and S2 Resp:CTA bilaterally, on rales/rhonchi HUT:MLYY, obese, NT, BS normal Ext:No LE edema  Labs: BMET  Recent Labs Lab 12/16/13 1153 12/17/13 0535  NA 142 139  K 4.5 3.7  CL 96 95*  CO2 20 24  GLUCOSE 82 71  BUN 114* 53*  CREATININE 15.77* 9.48*  CALCIUM 9.4 8.6   CBC  Recent Labs Lab 12/16/13 1153 12/17/13 0535  WBC 8.6 6.8  HGB 9.3* 8.7*  HCT 26.8* 25.6*  MCV 93.1 93.4  PLT 238 228   Medications:    . aspirin EC  325 mg Oral Daily  . atorvastatin  30 mg Oral QHS  . carbidopa-levodopa  0.5 tablet Oral TID  . darbepoetin (ARANESP) injection - DIALYSIS  100 mcg Intravenous Q Fri-HD  . divalproex  1,500 mg Oral Daily  . docusate sodium  100 mg Oral QHS  . escitalopram  10 mg Oral Daily  . feeding supplement (PRO-STAT SUGAR FREE 64)  30 mL Oral Q1500  . gabapentin  100 mg Oral QHS  . heparin  5,000 Units Subcutaneous 3 times per day  . insulin aspart  0-9 Units  Subcutaneous TID WC  . metoprolol tartrate  12.5 mg Oral QHS  . multivitamin  1 tablet Oral QHS  . multivitamin  1 tablet Oral QHS  . sevelamer carbonate  3,200 mg Oral TID WC   Elmarie Shiley, MD 12/17/2013, 9:08 AM

## 2013-12-18 ENCOUNTER — Inpatient Hospital Stay (HOSPITAL_COMMUNITY): Payer: Medicare Other

## 2013-12-18 LAB — GLUCOSE, CAPILLARY
GLUCOSE-CAPILLARY: 95 mg/dL (ref 70–99)
GLUCOSE-CAPILLARY: 121 mg/dL — AB (ref 70–99)
Glucose-Capillary: 98 mg/dL (ref 70–99)
Glucose-Capillary: 116 mg/dL — ABNORMAL HIGH (ref 70–99)

## 2013-12-18 NOTE — Progress Notes (Signed)
[facility] PROGRESS NOTE  [name] [name] IRC:[id number] DOB: Sep 01, 1941 DOA: 12/16/2013 PCP: Donetta Potts, MD  Assessment/Plan: Diarrhea  -Resolving, has had no further diarrhea today. -No sample sent for stool pathogen panel was C. difficile PCR was no further diarrhea so far. -Avoiding antimotility agents  -Per family the nursing facility is quarantined and wife states she is not comfortable letting him go back to that facility at this time Transient expressive aphasia  - MRI brain 4/5 negative for acute infarct -Although less likely seizure, we'll obtain EEG given the patient's seizure history  -Continue aspirin  -06/20/2013 carotid Doppler negative for hemodynamically significant stenosis  - 06/20/2013 MRI brain negative for acute infarction  ESRD  Dialyzed today per renal, follow. Seizure disorder  -Continue Depakote 1500 mg daily  -Check depakote level  Paroxysmal Atrial fibrillation  -Rate controlled  -Continue metoprolol tartrate  -Continue aspirin  Diabetes mellitus type 2  -Patient is "diet controlled"  -Hemoglobin A1c  -NovoLog sliding scale for now  Depression  -Continue Lexapro      Code Status: Full Family Communication: None at bedside Disposition Plan: Back to SNF when medically ready   Consultants:  Renal  Procedures:  None  Antibiotics:  None  HPI/Subjective: Denies diarrhea, also no nausea vomiting and abdominal pain  Objective: Filed Vitals:   12/18/13 1734  BP: 118/61  Pulse: 77  Temp: 97.9 F (36.6 C)  Resp: 20    Intake/Output Summary (Last 24 hours) at 12/18/13 1806 Last data filed at 12/18/13 1416  Gross per 24 hour  Intake    480 ml  Output      0 ml  Net    480 ml   Filed Weights   12/17/13 0840 12/17/13 1320 12/17/13 2128  Weight: 118.2 kg (260 lb 9.3 oz) 117.7 kg (259 lb 7.7 oz) 117 kg (257 lb 15 oz)    Exam:  General: alert & oriented x 3 In NAD Cardiovascular: RRR, nl S1 s2 Respiratory:  CTAB Abdomen: soft +BS NT/ND, no masses palpable Extremities: No cyanosis and no edema, status post left BKA     Data Reviewed: Basic Metabolic Panel:  Recent Labs Lab 12/16/13 1153 12/17/13 0535 12/17/13 0917  NA 142 139 139  K 4.5 3.7 3.8  CL 96 95* 95*  CO2 20 24 24   GLUCOSE 82 71 94  BUN 114* 53* 58*  CREATININE 15.77* 9.48* 9.91*  CALCIUM 9.4 8.6 9.3  PHOS  --   --  4.9*   Liver Function Tests:  Recent Labs Lab 12/16/13 1153 12/17/13 0917  AST 13  --   ALT <5  --   ALKPHOS 54  --   BILITOT 0.3  --   PROT 6.9  --   ALBUMIN 3.1* 2.8*   No results found for this basename: LIPASE, AMYLASE,  in the last 168 hours No results found for this basename: AMMONIA,  in the last 168 hours CBC:  Recent Labs Lab 12/16/13 1153 12/17/13 0535  WBC 8.6 6.8  HGB 9.3* 8.7*  HCT 26.8* 25.6*  MCV 93.1 93.4  PLT 238 228   Cardiac Enzymes: No results found for this basename: CKTOTAL, CKMB, CKMBINDEX, TROPONINI,  in the last 168 hours BNP (last 3 results)  Recent Labs  05/26/13 1700  PROBNP 699.3*   CBG:  Recent Labs Lab 12/17/13 1613 12/17/13 2126 12/18/13 0816 12/18/13 1255 12/18/13 1636  GLUCAP 141* 86 95 98 116*    Recent Results (from the past 240 hour(s))  MRSA  PCR SCREENING     Status: None   Collection Time    12/17/13  5:22 AM      Result Value Ref Range Status   MRSA by PCR NEGATIVE  NEGATIVE Final   Comment:            The GeneXpert MRSA Assay (FDA     approved for NASAL specimens     only), is one component of a     comprehensive MRSA colonization     surveillance program. It is not     intended to diagnose MRSA     infection nor to guide or     monitor treatment for     MRSA infections.     Studies: Mr Brain Wo Contrast  12/18/2013   CLINICAL DATA:  Transient expressive aphasia. History of brain tumor.  EXAM: MRI HEAD WITHOUT CONTRAST  TECHNIQUE: Multiplanar, multiecho pulse sequences of the brain and surrounding structures were  obtained without intravenous contrast.  COMPARISON:  CT HEAD W/O CM dated 09/08/2013; MR HEAD W/O CM dated 06/20/2013  FINDINGS: The patient was unable to remain motionless for the exam. Small or subtle lesions could be overlooked. Atrophy with chronic microvascular ischemic change. Previous frontotemporal craniotomy with surgical encephalomalacia primarily in the right temporal lobe. No restricted diffusion to suggest acute stroke. No MR features to suggest acute hemorrhage. Susceptibility artifact surrounds the surgical site. Flow voids are maintained. No definitive signs of recurrent tumor. No acute sinus or mastoid disease.  Compared with prior MR, the appearance is roughly is similar.  IMPRESSION: Motion degraded exam demonstrates no visible acute stroke or intracranial mass lesion. Chronic changes as described.   Electronically Signed   By: Rolla Flatten M.D.   On: 12/18/2013 10:50    Scheduled Meds: . aspirin EC  325 mg Oral Daily  . atorvastatin  30 mg Oral QHS  . carbidopa-levodopa  0.5 tablet Oral TID  . darbepoetin (ARANESP) injection - DIALYSIS  100 mcg Intravenous Q Fri-HD  . divalproex  1,500 mg Oral Daily  . docusate sodium  100 mg Oral QHS  . escitalopram  10 mg Oral Daily  . feeding supplement (PRO-STAT SUGAR FREE 64)  30 mL Oral Q1500  . gabapentin  100 mg Oral QHS  . heparin  5,000 Units Subcutaneous 3 times per day  . insulin aspart  0-9 Units Subcutaneous TID WC  . metoprolol tartrate  12.5 mg Oral QHS  . multivitamin  1 tablet Oral QHS  . multivitamin  1 tablet Oral QHS  . sevelamer carbonate  3,200 mg Oral TID WC   Continuous Infusions:   Active Problems:   Diarrhea   Expressive aphasia    Time spent: [facility] Pager [phone]. If 7PM-7AM, please contact night-coverage at [email or web address], password [facility] 12/18/2013, 6:06 PM  LOS: 2 days

## 2013-12-18 NOTE — Progress Notes (Signed)
I have personally seen and examined this patient and agree with the assessment/plan as outlined above by [facility] PA. Possible seizure earlier today (previously manifested as expressive aphasia), MRI of the brain done to rule out possible CVA. No further diarrhea (initial reason for admission). Trevyon Swor K.,MD 12/18/2013 10:51 AM

## 2013-12-18 NOTE — Progress Notes (Signed)
[facility] Progress Note  Assessment/Plan: 1. Diarrhea - None since admission- studies pending.   2. ESRD - TTS,  3. Hypertension/volume - Low BP, UFonly 238 cc Saturday - BP better today 4. Anemia - Low Hgb, continue on ESA for now. No overt losses. 5. Metabolic bone disease - Corr Ca 10.4 P 4.9 PTH 90. Continue 2Ca bath. On Renvela 4 TIDac.  6. Nutrition - Alb 3.1. Renal diet. Multivit, prostat. 7. Hx Afib - no anticoagulation 8. Seizure disorder - on Depakote at ALF 9. Expressive aphasia  Saturday evening- went for MRI this am; anticipate d/c if neg  [name], PA-C [place] [phone] 12/18/2013,9:29 AM  LOS: 2 days   Subjective:   Tells me the MRI is because the NH wanted it because they noticed he was slurring his speech and having trouble finding words last week.  He said this also happens when he has seizures.  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  EDW 118 HD Bath 2K/2Ca Time 4:30 Heparin 8000. Access L AVF BFR 350 DFR 800  Hectorol 0 mcg IV/HD Epogen 1200 Units IV/HD Venofer 0  Recent labs: Hgb 10.5, Tsat 18%, Ferr 1237, PTH 90, Phos 6.2   Additional Objective Labs: Basic Metabolic Panel:  Recent Labs Lab 12/16/13 1153 12/17/13 0535 12/17/13 0917  NA 142 139 139  K 4.5 3.7 3.8  CL 96 95* 95*  CO2 20 24 24   GLUCOSE 82 71 94   BUN 114* 53* 58*  CREATININE 15.77* 9.48* 9.91*  CALCIUM 9.4 8.6 9.3  PHOS  --   --  4.9*   Liver Function Tests:  Recent Labs Lab 12/16/13 1153 12/17/13 0917  AST 13  --   ALT <5  --   ALKPHOS 54  --   BILITOT 0.3  --   PROT 6.9  --   ALBUMIN 3.1* 2.8*   CBC:  Recent Labs Lab 12/16/13 1153 12/17/13 0535  WBC 8.6 6.8  HGB 9.3* 8.7*  HCT 26.8* 25.6*  MCV 93.1 93.4  PLT 238 228  CBG:  Recent Labs Lab 12/16/13 2029 12/17/13 0801 12/17/13 1613 12/17/13 2126 12/18/13 0816  GLUCAP 122* 69* 141* 86 95  Medications:   . aspirin EC  325 mg Oral Daily  . atorvastatin  30 mg Oral QHS  . carbidopa-levodopa  0.5 tablet Oral TID  . darbepoetin (ARANESP) injection - DIALYSIS  100 mcg Intravenous Q Fri-HD  . divalproex  1,500 mg Oral Daily  . docusate sodium  100 mg Oral QHS  . escitalopram  10 mg Oral Daily  . feeding supplement (PRO-STAT SUGAR FREE 64)  30 mL Oral Q1500  . gabapentin  100 mg Oral QHS  . heparin  5,000 Units Subcutaneous 3 times  per day  . insulin aspart  0-9 Units Subcutaneous TID WC  . metoprolol tartrate  12.5 mg Oral QHS  . multivitamin  1 tablet Oral QHS  . multivitamin  1 tablet Oral QHS  . sevelamer carbonate  3,200 mg Oral TID WC

## 2013-12-19 ENCOUNTER — Inpatient Hospital Stay (HOSPITAL_COMMUNITY): Payer: Medicare Other

## 2013-12-19 DIAGNOSIS — G40209 Localization-related (focal) (partial) symptomatic epilepsy and epileptic syndromes with complex partial seizures, not intractable, without status epilepticus: Secondary | ICD-10-CM

## 2013-12-19 LAB — GLUCOSE, CAPILLARY
GLUCOSE-CAPILLARY: 89 mg/dL (ref 70–99)
GLUCOSE-CAPILLARY: 115 mg/dL — AB (ref 70–99)
GLUCOSE-CAPILLARY: 83 mg/dL (ref 70–99)

## 2013-12-19 MED ORDER — HYDROCODONE-ACETAMINOPHEN 5-325 MG PO TABS: 1.0000 | ORAL_TABLET | ORAL | Status: DC | PRN

## 2013-12-19 NOTE — Progress Notes (Signed)
Pt gone down via bed for EEG.

## 2013-12-19 NOTE — Evaluation (Signed)
Occupational Therapy Evaluation Patient Details Name: [name] [name] MRN: [id number] DOB: September 01, 1941 Today's Date: 12/19/2013    History of Present Illness 73 year old male with a history of ESRD with dialysis on MWF, peripheral vascular disease, hypertension, seizure disorder, diet controlled diabetes mellitus, PAF, resected oligoastrocytoma presents with 3 to four-day history of diarrhea   Clinical Impression   Pt. Is doing well with performing ADLs sitting EOB. Pt. Would benefit from further OT to maximize performance with ADLs and mobility and return to ALF at Mod I level.     Follow Up Recommendations  Home health OT    Equipment Recommendations  None recommended by OT    Recommendations for Other Services       Precautions / Restrictions Precautions Precautions: Fall Restrictions Other Position/Activity Restrictions: left BKA has prosthesis here      Mobility Bed Mobility Overal bed mobility: Modified Independent                Transfers Overall transfer level: Needs assistance Equipment used: Rolling walker (2 wheeled) Transfers: Sit to/from Stand Sit to Stand: Min assist         General transfer comment: Min Guard A    Balance Overall balance assessment: Needs assistance         Standing balance support: Bilateral upper extremity supported Standing balance-Leahy Scale: Poor Standing balance comment: heavily reliant on UE support for balance                            ADL Overall ADL's : Needs assistance/impaired Eating/Feeding: Independent   Grooming: Wash/dry hands;Wash/dry face;Brushing hair;Set up   Upper Body Bathing: Set up   Lower Body Bathing: Min guard;Sit to/from stand   Upper Body Dressing : Set up   Lower Body Dressing: Set up;Min guard;Sit to/from stand   Toilet Transfer: Min guard;BSC   Toileting- Water quality scientist and Hygiene: Min guard;Sit to/from stand         General ADL Comments: Pt. is doing  well with performing ADLs     Vision                     Perception     Praxis      Pertinent Vitals/Pain No c/o     Hand Dominance Right   Extremity/Trunk Assessment Upper Extremity Assessment Upper Extremity Assessment: Overall WFL for tasks assessed   Lower Extremity Assessment Lower Extremity Assessment: Overall WFL for tasks assessed   Cervical / Trunk Assessment Cervical / Trunk Assessment: Kyphotic   Communication Communication Communication: No difficulties   Cognition Arousal/Alertness: Awake/alert Behavior During Therapy: WFL for tasks assessed/performed Overall Cognitive Status: History of cognitive impairments - at baseline                     General Comments       Exercises       Shoulder Instructions      Home Living Family/patient expects to be discharged to:: Assisted living                             Home Equipment: Kasandra Knudsen - single point;Tub bench          Prior Functioning/Environment Level of Independence: Independent with assistive device(s)             OT Diagnosis: Generalized weakness   OT Problem List: Decreased activity tolerance   OT  Treatment/Interventions: Self-care/ADL training;DME and/or AE instruction;Therapeutic activities    OT Goals(Current goals can be found in the care plan section) Acute Rehab OT Goals Patient Stated Goal: go back to ALF OT Goal Formulation: With patient Time For Goal Achievement: 01/02/14 Potential to Achieve Goals: Good ADL Goals Pt Will Perform Grooming: Independently;standing Pt Will Perform Lower Body Dressing: with modified independence;sit to/from stand Pt Will Transfer to Toilet: with modified independence;bedside commode Pt Will Perform Toileting - Clothing Manipulation and hygiene: with modified independence;sit to/from stand  OT Frequency: Min 2X/week   Barriers to D/C: Decreased caregiver support          Co-evaluation              End  of Session Equipment Utilized During Treatment: Rolling walker  Activity Tolerance: Patient tolerated treatment well Patient left: in bed;with call bell/phone within reach   Time: 7902-4097 OT Time Calculation (min): 26 min Charges:  OT General Charges $OT Visit: 1 Procedure OT Evaluation $Initial OT Evaluation Tier I: 1 Procedure OT Treatments $Self Care/Home Management : 8-22 mins G-Codes:    Heavenly Christine 2014/01/07, 1:03 PM

## 2013-12-19 NOTE — Progress Notes (Signed)
EEG Completed; Results Pending  

## 2013-12-19 NOTE — Discharge Summary (Signed)
Physician Discharge Summary  [name] [name] HCW:[id number] DOB: 1941/09/15 DOA: 12/16/2013  PCP: Donetta Potts, MD  Admit date: 12/16/2013 Discharge date: 12/19/2013  Time spent: >30 minutes  Recommendations for Outpatient Follow-up:  Follow-up Information   Follow up with COLADONATO,JOSEPH A, MD. (For dialysis as scheduled)    Specialty:  Nephrology   Contact information:   [place] [address] [phone]       Please follow up. (PCP in 1 to 2 weeks, call for appointment.)        Discharge Diagnoses:  Active Problems:   Diarrhea   Transient Expressive aphasia ESRD Seizure disorder Paroxysmal atrial fibrillation Diabetes mellitus,type 2 Depression  Discharge Condition: Improved/stable  Diet recommendation: Renal  Filed Weights   12/17/13 1320 12/17/13 2128 12/18/13 2112  Weight: 117.7 kg (259 lb 7.7 oz) 117 kg (257 lb 15 oz) 118 kg (260 lb 2.3 oz)    History of present illness:  73 year old male with a history of ESRD with dialysis on MWF, peripheral vascular disease, hypertension, seizure disorder, diet controlled diabetes mellitus, PAF, resected oligoastrocytoma presents with 3 to four-day history of diarrhea. The patient comes from [facility] where a number of residence has been having diarrhea. It has been confirmed that one of the residents had norovirus in stool, and the EDP also stated there was a confirmed case of C.diff. The patient denies any fevers, chills, chest pain, shortness breath, abdominal pain, rashes. He did have one episode of nausea and vomiting approximately 3 days prior to admission. And vomiting since that period of time. In addition, the patient also complained of a transient episode of expressive aphasia on the day prior to admission that lasted approximately 2-3 seconds. The patient denied any loss of consciousness or bowel or bladder function. He denied any focal extremity weakness. He  denied any headache or visual disturbance. There is no sensory disturbance. The patient states that his loose stools seem to be decreasing over the past 2 days. He denies any hematochezia or melena.  The patient's dialysis Center has not allowed the patient to go for dialysis this past week. The patient missed 2 dialysis sessions. As a result, the patient was sent to the emergency department.  In the ED, the patient was noted to be hemodynamically stable and afebrile. BMP was essentially unremarkable except serum creatinine of 15.77. Hepatic panel was unremarkable. CBC showed hemoglobin 9.3, WBC 8.6. Lactic acid was 0.6. He was admitted for further evaluation and management.   Hospital Course:  Diarrhea  -Probable viral gastroenteritis, Resolved -No sample sent for stool pathogen panel was C. difficile PCR was no further diarrhea so far.  -Patient has not had any further diarrhea throughout his hospital course, has been tolerating by mouth as well. His to follow up outpatient with PCP Transient expressive aphasia  - Upon admission it was noted that 06/20/2013 carotid Doppler negative for hemodynamically significant stenosis and he also had  MRI brain negative for acute infarction on 06/20/2013 - MRI brain done on 4/5 this admission and came back negative for acute infarct -Although less likely seizure, EEG was ordered on admission but she was done today and the results are pending at this time. He has not had any seizure-like activity in the hospital and has not had any further episodes aphasia, and his Depakote level is therapeutic at 50.3. His to follow up with his PCP for results of the EEG. -Continue aspirin   ESRD  Dialyzed today per renal,  follow.  Seizure disorder  -Continue Depakote 1500 mg daily  -Check depakote level was done and came back therapeutic at 50.3 As above EEG was done today but results are pending-He has not had any seizure-like activity in the hospital and his Depakote  level is therapeutic as above >> will continue his Depakote as ready mentioned and his to follow up with his PCP for results of the EEG. Paroxysmal Atrial fibrillation  -Rate controlled  -Continue metoprolol tartrate  -Continue aspirin  Diabetes mellitus type 2  -Patient is "diet controlled"  -Hemoglobin A1c was done and came back at 5.8 -Patient was covered with sliding scale insulin in the hospital.  Depression  -Continue Lexapro      Procedures:  None  Consultations:  Renal  Discharge Exam: Filed Vitals:   12/19/13 1400  BP: 110/60  Pulse: 71  Temp: 98.2 F (36.8 C)  Resp: 18   Exam:  General: alert & oriented x 3 In NAD  Cardiovascular: RRR, nl S1 s2  Respiratory: CTAB  Abdomen: soft +BS NT/ND, no masses palpable  Extremities: No cyanosis and no edema, status post left BKA    Discharge Instructions You were cared for by a hospitalist during your hospital stay. If you have any questions about your discharge medications or the care you received while you were in the hospital after you are discharged, you can call the unit and asked to speak with the hospitalist on call if the hospitalist that took care of you is not available. Once you are discharged, your primary care physician will handle any further medical issues. Please note that NO REFILLS for any discharge medications will be authorized once you are discharged, as it is imperative that you return to your primary care physician (or establish a relationship with a primary care physician if you do not have one) for your aftercare needs so that they can reassess your need for medications and monitor your lab values.  Discharge Orders   Future Appointments Provider Department Dept Phone   12/22/2013 10:00 AM Philmore Pali, NP [facility] [phone]   Future Orders Complete By Expires   Diet renal 60/70-10-17-1198  As directed    Increase activity slowly  As directed        Medication List          acetaminophen 325 MG tablet  Commonly known as:  TYLENOL  Take 325 mg by mouth every 6 (six) hours as needed for moderate pain or fever.     aspirin EC 325 MG tablet  Take 325 mg by mouth daily.     atorvastatin 20 MG tablet  Commonly known as:  LIPITOR  Take 30 mg by mouth at bedtime.     carbidopa-levodopa 25-100 MG per tablet  Commonly known as:  SINEMET IR  Take 0.5 tablets by mouth 3 (three) times daily.     divalproex 500 MG DR tablet  Commonly known as:  DEPAKOTE  Take 1,500 mg by mouth daily.     docusate sodium 100 MG capsule  Commonly known as:  COLACE  Take 100 mg by mouth at bedtime.     escitalopram 10 MG tablet  Commonly known as:  LEXAPRO  Take 10 mg by mouth daily.     gabapentin 100 MG capsule  Commonly known as:  NEURONTIN  Take 1 capsule by mouth at bedtime.     HYDROcodone-acetaminophen 5-325 MG per tablet  Commonly known as:  NORCO/VICODIN  Take 1 tablet by  mouth every 4 (four) hours as needed for moderate pain.     metoprolol tartrate 25 MG tablet  Commonly known as:  LOPRESSOR  Take 6.25 mg by mouth 2 (two) times daily. Take 6.25mg  by mouth daily     nitroGLYCERIN 0.4 MG SL tablet  Commonly known as:  NITROSTAT  Place 1 tablet (0.4 mg total) under the tongue every 5 (five) minutes x 3 doses as needed for chest pain.     omeprazole 20 MG tablet  Commonly known as:  PRILOSEC OTC  Take 20 mg by mouth daily as needed (heartburn).     RENO CAPS 1 MG Caps  Take 1 mg by mouth every evening. 8pm     sevelamer carbonate 800 MG tablet  Commonly known as:  RENVELA  Take 3,200 mg by mouth 3 (three) times daily with meals.       No Known Allergies     Follow-up Information   Follow up with COLADONATO,JOSEPH A, MD. (For dialysis as scheduled)    Specialty:  Nephrology   Contact information:   [place] [address] [phone]       Please follow up. (PCP in 1 to 2 weeks, call for appointment.)         The results of significant diagnostics from this hospitalization (including imaging, microbiology, ancillary and laboratory) are listed below for reference.    Significant Diagnostic Studies: Mr Brain Wo Contrast  12/18/2013   CLINICAL DATA:  Transient expressive aphasia. History of brain tumor.  EXAM: MRI HEAD WITHOUT CONTRAST  TECHNIQUE: Multiplanar, multiecho pulse sequences of the brain and surrounding structures were obtained without intravenous contrast.  COMPARISON:  CT HEAD W/O CM dated 09/08/2013; MR HEAD W/O CM dated 06/20/2013  FINDINGS: The patient was unable to remain motionless for the exam. Small or subtle lesions could be overlooked. Atrophy with chronic microvascular ischemic change. Previous frontotemporal craniotomy with surgical encephalomalacia primarily in the right temporal lobe. No restricted diffusion to suggest acute stroke. No MR features to suggest acute hemorrhage. Susceptibility artifact surrounds the surgical site. Flow voids are maintained. No definitive signs of recurrent tumor. No acute sinus or mastoid disease.  Compared with prior MR, the appearance is roughly is similar.  IMPRESSION: Motion degraded exam demonstrates no visible acute stroke or intracranial mass lesion. Chronic changes as described.   Electronically Signed   By: Rolla Flatten M.D.   On: 12/18/2013 10:50    Microbiology: Recent Results (from the past 240 hour(s))  MRSA PCR SCREENING     Status: None   Collection Time    12/17/13  5:22 AM      Result Value Ref Range Status   MRSA by PCR NEGATIVE  NEGATIVE Final   Comment:            The GeneXpert MRSA Assay (FDA     approved for NASAL specimens     only), is one component of a     comprehensive MRSA colonization     surveillance program. It is not     intended to diagnose MRSA     infection nor to guide or     monitor treatment for     MRSA infections.     Labs: Basic Metabolic Panel:  Recent Labs Lab 12/16/13 1153  12/17/13 0535 12/17/13 0917  NA 142 139 139  K 4.5 3.7 3.8  CL 96 95* 95*  CO2 20 24 24   GLUCOSE 82 71 94  BUN 114* 53* 58*  CREATININE 15.77* 9.48* 9.91*  CALCIUM 9.4 8.6 9.3  PHOS  --   --  4.9*   Liver Function Tests:  Recent Labs Lab 12/16/13 1153 12/17/13 0917  AST 13  --   ALT <5  --   ALKPHOS 54  --   BILITOT 0.3  --   PROT 6.9  --   ALBUMIN 3.1* 2.8*   No results found for this basename: LIPASE, AMYLASE,  in the last 168 hours No results found for this basename: AMMONIA,  in the last 168 hours CBC:  Recent Labs Lab 12/16/13 1153 12/17/13 0535  WBC 8.6 6.8  HGB 9.3* 8.7*  HCT 26.8* 25.6*  MCV 93.1 93.4  PLT 238 228   Cardiac Enzymes: No results found for this basename: CKTOTAL, CKMB, CKMBINDEX, TROPONINI,  in the last 168 hours BNP: BNP (last 3 results)  Recent Labs  05/26/13 1700  PROBNP 699.3*   CBG:  Recent Labs Lab 12/18/13 1255 12/18/13 1636 12/18/13 2109 12/19/13 0805 12/19/13 1118  GLUCAP 98 116* 121* 89 115*       Signed:  Tiffny Gemmer C  [facility] 12/19/2013, 3:48 PM

## 2013-12-19 NOTE — Progress Notes (Signed)
   CARE MANAGEMENT NOTE 12/19/2013  Patient:  [name] [name],[name] [name]   Account Number:  [id number]  Date Initiated:  12/19/2013  Documentation initiated by:  Lizabeth Leyden  Subjective/Objective Assessment:   admitted from [place]  with c/o nausea, vomitting, diarrhea x 3  days.  Medical hx: ESRD/HD     Action/Plan:   return to [facility]   Anticipated DC Date:  12/19/2013   Anticipated DC Plan:  [place]  CM consult      Choice offered to / List presented to:             Status of service:  Completed, signed off Medicare Important Message given?   (If response is "NO", the following Medicare IM given date fields will be blank) Date Medicare IM given:   Date Additional Medicare IM given:    Discharge Disposition:  [place]  Per UR Regulation:  Reviewed for med. necessity/level of care/duration of stay  If discussed at [place] of Stay Meetings, dates discussed:    Comments:  12/19/2013  [facility]  [phone] faxed information to [facility] call to [facility] to advise of discharge for today.  12/19/2013  [place], Marcus  [place]  RN, Health Director [phone] The quarantine is over for the norovirus/cdiff and the patient can return today. PT and OT are recommending home health. The ALF has their own therapy services, need MD order, copy of H&P, DC summary, no FL2 is needed. FAX:  [phone]

## 2013-12-19 NOTE — Procedures (Signed)
ELECTROENCEPHALOGRAM REPORT  Patient: [name] [name]       Room #: 4M25 EEG No. ID: [id number] Age: 73 y.o.        Sex: male Referring Physician: Dillard Essex Report Date:  12/19/2013        Interpreting Physician: Anthony Sar  History: Olivier Frayre is an 73 y.o. male with a history of end-stage renal disease on dialysis, hypertension, peripheral vascular disease, diabetes mellitus, resected brain tumor and seizure disorder.  Indications for study:  Assess severity of encephalopathy; rule out seizure activity.  Technique: This is an 18 channel routine scalp EEG performed at the bedside with bipolar and monopolar montages arranged in accordance to the international 10/20 system of electrode placement.   Description:this EEG recording was performed during wakefulness. Patient was noted to be confused during the study. Predominate background activity consisted of very low amplitude mixed irregular diffuse symmetrical delta activity. Photic stimulation produced a minimal bilateral occipital driving response. Hyperventilation was not performed. No epileptiform discharges recorded.  Interpretation: this EEG is abnormal with moderately severe generalized continuous nonspecific slowing of cerebral activity which can be seen with metabolic as well as degenerative and toxic encephalopathies. No epileptiform activity was recorded.    Rush Farmer M.D. [facility] [phone]

## 2013-12-19 NOTE — Progress Notes (Signed)
Subjective:  Comfortable in bed, no complaints, hoping for discharge.  Objective: Vital signs in last 24 hours: Temp:  [97.9 F (36.6 C)-99.1 F (37.3 C)] 98.8 F (37.1 C) (04/06 0536) Pulse Rate:  [64-77] 64 (04/06 0536) Resp:  [18-20] 18 (04/06 0536) BP: (113-128)/(53-68) 115/60 mmHg (04/06 0536) SpO2:  [98 %-100 %] 99 % (04/06 0536) Weight:  [118 kg (260 lb 2.3 oz)] 118 kg (260 lb 2.3 oz) (04/05 2112) Weight change: -0.2 kg (-7.1 oz)  Intake/Output from previous day: 04/05 0701 - 04/06 0700 In: 480 [P.O.:480] Out: -    Lab Results:  Recent Labs  12/16/13 1153 12/17/13 0535  WBC 8.6 6.8  HGB 9.3* 8.7*  HCT 26.8* 25.6*  PLT 238 228   BMET:  Recent Labs  12/16/13 1153 12/17/13 0535 12/17/13 0917  NA 142 139 139  K 4.5 3.7 3.8  CL 96 95* 95*  CO2 20 24 24   GLUCOSE 82 71 94  BUN 114* 53* 58*  CREATININE 15.77* 9.48* 9.91*  CALCIUM 9.4 8.6 9.3  ALBUMIN 3.1*  --  2.8*   No results found for this basename: PTH,  in the last 72 hours Iron Studies: No results found for this basename: IRON, TIBC, TRANSFERRIN, FERRITIN,  in the last 72 hours  Studies/Results: Mr Brain Wo Contrast  12/18/2013   CLINICAL DATA:  Transient expressive aphasia. History of brain tumor.  EXAM: MRI HEAD WITHOUT CONTRAST  TECHNIQUE: Multiplanar, multiecho pulse sequences of the brain and surrounding structures were obtained without intravenous contrast.  COMPARISON:  CT HEAD W/O CM dated 09/08/2013; MR HEAD W/O CM dated 06/20/2013  FINDINGS: The patient was unable to remain motionless for the exam. Small or subtle lesions could be overlooked. Atrophy with chronic microvascular ischemic change. Previous frontotemporal craniotomy with surgical encephalomalacia primarily in the right temporal lobe. No restricted diffusion to suggest acute stroke. No MR features to suggest acute hemorrhage. Susceptibility artifact surrounds the surgical site. Flow voids are maintained. No definitive signs of recurrent  tumor. No acute sinus or mastoid disease.  Compared with prior MR, the appearance is roughly is similar.  IMPRESSION: Motion degraded exam demonstrates no visible acute stroke or intracranial mass lesion. Chronic changes as described.   Electronically Signed   By: Rolla Flatten M.D.   On: 12/18/2013 10:50   EXAM: General appearance:  Alert, in no apparent distress Resp:  CTA without rales, rhonchi, or wheezes Cardio:  RRR without murmur or rub GI:  + BS, soft and nontender Extremities:  No edema on R, L BKA Access:  AVF @ LUA with + bruit  Dialysis Orders: Center: [facility] on TTS .  EDW 118 HD Bath 2K/2Ca Time 4:30 Heparin 8000. Access L AVF BFR 350 DFR 800  Hectorol 0 mcg IV/HD Epogen 1200 Units IV/HD Venofer 0  Recent labs: Hgb 10.5, Tsat 18%, Ferr 1237, PTH 90, Phos 6.2  Assessment/Plan: 1. Diarrhea - resolved. 2. Expressive aphasia - yesterday AM, evaluated for possible seizure disorder, no acute abnormality per MRI 4/5. 3. ESRD - HD on TTS @ [place].  HD tomorrow. 4. HTN/Volume - BP 115/60 stable, no meds; currently @ EDW. 5. Anemia - Hgb 8.7, Aranesp 100 mcg. 6. Sec HPT - Ca 8.6 (9.3 corrected); no Hectorol, Renvela 4 with meals.  7. Nutrition - Alb 3.1, renal diet, multivitamin, Prostat. 8. Hx A-fib - no anticoagulation.   LOS: 3 days   LYLES,CHARLES 12/19/2013,10:11 AM   Pt seen, examined, agree w assess/plan as above with additions  as indicated.  Kelly Splinter MD pager [phone]    cell [phone] 12/19/2013, 12:56 PM

## 2013-12-19 NOTE — Progress Notes (Signed)
Pt discharged back to [facility].  Pts wife to transport patient back in personal vehicle.

## 2013-12-19 NOTE — Progress Notes (Signed)
PT walked with patient in hall with walker with prosthesis without difficulty.  Pt tolerated well.  Pt to go down for EEG possibly before lunch.

## 2013-12-19 NOTE — Patient Care Conference (Signed)
Pt discharge instructions given, pt verbalized understanding. VSS.  Denies pain. Pleasant. Pts wife verbalized understanding.  Pt left floor via wheelchair accompanied by staff and family back to [facility].

## 2013-12-19 NOTE — Evaluation (Signed)
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  He will benefit from skilled PT in the acute setting to allow decreased burden of care at d/c.  States was getting around independently at facility PTA, now needs assist for safety due to weakness.  Will benefit from HHPT at ALF upon d/c.    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  Ambulation/Gait Ambulation/Gait assistance: Min guard Ambulation Distance (Feet): 200 Feet Assistive device: Rolling walker (2 wheeled) Gait Pattern/deviations: Step-to pattern;Step-through pattern;Trunk flexed;Decreased stride length;Decreased step length - left;Trendelenburg     General Gait Details: worse due to no shoe right foot and weak in hip  on right with trendelebnerg deformity and left leg functionally longer catching floor at times  Stairs            Wheelchair Mobility    Modified  Rankin (Stroke Patients Only)       Balance Overall balance assessment: Needs assistance         Standing balance support: Bilateral upper extremity supported Standing balance-Leahy Scale: Poor Standing balance comment: heavily reliant on UE support for balance                             Pertinent Vitals/Pain No pain complaints    Home Living Family/patient expects to be discharged to:: Assisted living [facility])                      Prior Function Level of Independence: Independent with assistive device(s)               Hand Dominance   Dominant Hand: Right    Extremity/Trunk Assessment               Lower Extremity Assessment: Overall WFL for tasks assessed      Cervical / Trunk Assessment: Kyphotic  Communication   Communication: No difficulties  Cognition Arousal/Alertness: Awake/alert Behavior During Therapy: WFL for tasks assessed/performed Overall Cognitive Status: History of cognitive impairments - at baseline                      General Comments      Exercises        Assessment/Plan    PT Assessment Patient needs continued PT services  PT Diagnosis Generalized weakness;Abnormality of gait   PT Problem List Decreased strength;Decreased balance;Decreased activity tolerance;Decreased mobility  PT Treatment Interventions  DME instruction;Gait training;Functional mobility training;Therapeutic activities;Patient/family education;Balance training;Therapeutic exercise   PT Goals (Current goals can be found in the Care Plan section) Acute Rehab PT Goals PT Goal Formulation: With patient Time For Goal Achievement: 01/02/14 Potential to Achieve Goals: Good    Frequency Min 3X/week   Barriers to discharge        Co-evaluation               End of Session   Activity Tolerance: Patient tolerated treatment well Patient left: in chair;with call bell/phone within reach;with chair alarm  set           Time: 5409-8119 PT Time Calculation (min): 34 min   Charges:   PT Evaluation $Initial PT Evaluation Tier I: 1 Procedure PT Treatments $Gait Training: 8-22 mins   PT G Codes:          Lennon Boutwell,CYNDI 2014-01-12, 11:49 AM Magda Kiel, [place] 2014/01/12

## 2013-12-22 ENCOUNTER — Ambulatory Visit: Payer: Self-pay | Admitting: Nurse Practitioner

## 2014-01-02 ENCOUNTER — Telehealth: Payer: Self-pay | Admitting: Diagnostic Neuroimaging

## 2014-01-02 NOTE — Telephone Encounter (Signed)
Pt's wife [name] calling requesting a sooner appt. Pt has appt on 01/13/14 with Jeani Hawking, NP and pt's wife is stating since she has been visiting the pt in the nursing facility that pt does not look well and that the pt's bottom lip is down and that his head id drooping and has slow speech. Please advise

## 2014-01-02 NOTE — Telephone Encounter (Signed)
Patient's wife calling to state that the last time she visited patient in the nursing facility, he did not look well. Patient's wife describes that is bottom lip is down and that his head is drooping and has slow speech. Patient's wife requesting sooner appointment. Please call and advise.

## 2014-01-03 NOTE — Telephone Encounter (Signed)
Patient's wife calling again to check on whether he can be seen sooner, please return call and advise.

## 2014-01-03 NOTE — Telephone Encounter (Signed)
Called pt and spoke with pt's wife [name] concerning a sooner appt. I informed the wife per Vito Berger, [place] Dr. Gladstone Lighter assistant, that Dr. Leta Baptist did not have any opening before 01/13/14. Pt's wife stated that she was going to address the situation with the facility that the pt is at and see what they are going to do about it. I advised the wife that if she has any other problems, questions or concerns to call the office. Pt's wife verbalized understanding.

## 2014-01-13 ENCOUNTER — Emergency Department (HOSPITAL_COMMUNITY): Payer: Medicare Other

## 2014-01-13 ENCOUNTER — Encounter (HOSPITAL_COMMUNITY): Payer: Self-pay | Admitting: Emergency Medicine

## 2014-01-13 ENCOUNTER — Ambulatory Visit: Payer: Self-pay | Admitting: Nurse Practitioner

## 2014-01-13 ENCOUNTER — Emergency Department (HOSPITAL_COMMUNITY)
Admission: EM | Admit: 2014-01-13 | Discharge: 2014-01-13 | Disposition: A | Discharge status: Home / Self Care | Payer: Medicare Other | Source: Home / Self Care | Attending: Emergency Medicine | Admitting: Emergency Medicine

## 2014-01-13 DIAGNOSIS — I4891 Unspecified atrial fibrillation: Secondary | ICD-10-CM

## 2014-01-13 DIAGNOSIS — Z992 Dependence on renal dialysis: Secondary | ICD-10-CM | POA: Insufficient documentation

## 2014-01-13 DIAGNOSIS — I509 Heart failure, unspecified: Secondary | ICD-10-CM | POA: Insufficient documentation

## 2014-01-13 DIAGNOSIS — D649 Anemia, unspecified: Secondary | ICD-10-CM

## 2014-01-13 DIAGNOSIS — G40909 Epilepsy, unspecified, not intractable, without status epilepticus: Secondary | ICD-10-CM | POA: Insufficient documentation

## 2014-01-13 DIAGNOSIS — Z8619 Personal history of other infectious and parasitic diseases: Secondary | ICD-10-CM

## 2014-01-13 DIAGNOSIS — E785 Hyperlipidemia, unspecified: Secondary | ICD-10-CM | POA: Insufficient documentation

## 2014-01-13 DIAGNOSIS — E119 Type 2 diabetes mellitus without complications: Secondary | ICD-10-CM

## 2014-01-13 DIAGNOSIS — M25559 Pain in unspecified hip: Secondary | ICD-10-CM

## 2014-01-13 DIAGNOSIS — S88119A Complete traumatic amputation at level between knee and ankle, unspecified lower leg, initial encounter: Secondary | ICD-10-CM

## 2014-01-13 DIAGNOSIS — Z79899 Other long term (current) drug therapy: Secondary | ICD-10-CM

## 2014-01-13 DIAGNOSIS — M129 Arthropathy, unspecified: Secondary | ICD-10-CM | POA: Insufficient documentation

## 2014-01-13 DIAGNOSIS — N186 End stage renal disease: Secondary | ICD-10-CM

## 2014-01-13 DIAGNOSIS — Z7982 Long term (current) use of aspirin: Secondary | ICD-10-CM | POA: Insufficient documentation

## 2014-01-13 DIAGNOSIS — K219 Gastro-esophageal reflux disease without esophagitis: Secondary | ICD-10-CM | POA: Insufficient documentation

## 2014-01-13 DIAGNOSIS — I120 Hypertensive chronic kidney disease with stage 5 chronic kidney disease or end stage renal disease: Secondary | ICD-10-CM | POA: Insufficient documentation

## 2014-01-13 DIAGNOSIS — Z9889 Other specified postprocedural states: Secondary | ICD-10-CM

## 2014-01-13 DIAGNOSIS — Z86011 Personal history of benign neoplasm of the brain: Secondary | ICD-10-CM

## 2014-01-13 MED ORDER — OXYCODONE-ACETAMINOPHEN 5-325 MG PO TABS
1.0000 | ORAL_TABLET | Freq: Once | ORAL | Status: AC
  Administered 2014-01-13: 1 via ORAL
  Filled 2014-01-13: qty 1

## 2014-01-13 MED ORDER — KETOROLAC TROMETHAMINE 30 MG/ML IJ SOLN
30.0000 mg | Freq: Once | INTRAMUSCULAR | Status: AC
  Administered 2014-01-13: 30 mg via INTRAMUSCULAR
  Filled 2014-01-13: qty 1

## 2014-01-13 MED ORDER — OXYCODONE-ACETAMINOPHEN 5-325 MG PO TABS: 1.0000 | ORAL_TABLET | Freq: Four times a day (QID) | ORAL | Status: DC | PRN

## 2014-01-13 NOTE — ED Provider Notes (Signed)
CSN: [id number]     Arrival date & time 01/13/14  1029 History   First MD Initiated Contact with Patient 01/13/14 1047     Chief Complaint  Patient presents with  . Hip Pain     (Consider location/radiation/quality/duration/timing/severity/associated sxs/prior Treatment) Patient is a 73 y.o. male presenting with hip pain.  Hip Pain Pertinent negatives include no chest pain, no abdominal pain, no headaches and no shortness of breath.   patient presents with right hip pain. It began couple days ago. It has been constant but somewhat worsening. No fevers. No cough. No trauma. No swelling . He did not fall. No rash. He's not had pain like this before. He no difficulty walking. The pain is worse after palpation, but not worse with palpation. He is a dialysis patient. 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 Patient states the pain also occasionally go to the left side.  Past Medical History  Diagnosis Date  . Dialysis patient   . Peripheral vascular disease   . ESRD (end stage renal disease) on dialysis 09/2010    MWF  . Hypertension   . Hyperlipidemia   . Arthritis     Gout  . Paroxysmal atrial fibrillation   . Brain tumor 2009  . Seizure disorder 2009  . Anemia   . Hyperparathyroidism, secondary   . Diabetes mellitus without complication     Type II, diet controlled  . Proteus mirabilis infection   . CHF (congestive heart failure)     grade 1 DD, preserved EF, [facility] records  . 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 brain tumor  . GERD (gastroesophageal reflux disease)   . Hx of cardiac cath     a. LHC (9/14):  Normal cors, EF 65%.    Past Surgical History  Procedure Laterality Date  . Av fistula placement Left     arm  . 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 Eye surgery Bilateral 2000    cataract  .  Retinopathy surgery      Hx. of laser treatments for diabetics   Family History  Problem Relation Age of Onset  . Seizures Mother   . Stroke Mother 32  . Hypertension Mother   . Alcohol abuse Father   . Hypertension Father   . Arthritis Sister   . Heart attack Neg Hx   . Heart failure Neg Hx    History  Substance Use Topics  . Smoking status: Never Smoker   . Smokeless tobacco: Never Used  . Alcohol Use: 0.6 oz/week    1 Shots of liquor per week    Review of Systems  Constitutional: Negative for activity change and appetite change.  Eyes: Negative for pain.  Respiratory: Negative for chest tightness and shortness of breath.   Cardiovascular: Negative for chest pain and leg swelling.  Gastrointestinal: Negative for nausea, vomiting, abdominal pain and diarrhea.  Genitourinary: Negative for flank pain.  Musculoskeletal: Negative for back pain and neck stiffness.       Right hip pain.  Skin: Negative for rash.  Neurological: Negative for weakness, numbness and headaches.  Psychiatric/Behavioral: Negative for behavioral problems.      Allergies  Review of patient's allergies indicates no known allergies.  Home Medications   Prior to Admission medications   Medication Sig Start Date  End Date Taking? Authorizing Provider  acetaminophen (TYLENOL) 325 MG tablet Take 325 mg by mouth every 6 (six) hours as needed for moderate pain or fever.   Yes Historical Provider, MD  aspirin EC 325 MG tablet Take 325 mg by mouth daily.   Yes Historical Provider, MD  atorvastatin (LIPITOR) 20 MG tablet Take 30 mg by mouth at bedtime.   Yes Historical Provider, MD  B Complex-C-Folic Acid (RENO CAPS) 1 MG CAPS Take 1 mg by mouth every evening. 8pm   Yes Historical Provider, MD  carbidopa-levodopa (SINEMET IR) 25-100 MG per tablet Take 0.5 tablets by mouth 3 (three) times daily.   Yes Historical Provider, MD  divalproex (DEPAKOTE) 500 MG DR tablet Take 1,500 mg by mouth daily.   Yes Historical  Provider, MD  docusate sodium (COLACE) 100 MG capsule Take 100 mg by mouth at bedtime.    Yes Historical Provider, MD  escitalopram (LEXAPRO) 10 MG tablet Take 10 mg by mouth daily.   Yes Historical Provider, MD  gabapentin (NEURONTIN) 300 MG capsule Take 300 mg by mouth at bedtime.   Yes Historical Provider, MD  HYDROcodone-acetaminophen (NORCO/VICODIN) 5-325 MG per tablet Take 1 tablet by mouth every 4 (four) hours as needed for moderate pain. 12/19/13  Yes Adeline Saralyn Pilar, MD  lanthanum (FOSRENOL) 1000 MG chewable tablet Chew 2,000 mg by mouth 3 (three) times daily with meals.   Yes Historical Provider, MD  metoprolol tartrate (LOPRESSOR) 25 MG tablet Take 6.25 mg by mouth 2 (two) times daily. Take 6.25mg  by mouth daily   Yes Historical Provider, MD  omeprazole (PRILOSEC OTC) 20 MG tablet Take 20 mg by mouth daily as needed (heartburn).    Yes Historical Provider, MD  simethicone (MYLICON) 80 MG chewable tablet Chew 80 mg by mouth 2 (two) times daily as needed for flatulence.   Yes Historical Provider, MD  nitroGLYCERIN (NITROSTAT) 0.4 MG SL tablet Place 1 tablet (0.4 mg total) under the tongue every 5 (five) minutes x 3 doses as needed for chest pain. 05/28/13   Nita Sells, MD   BP 130/79  Pulse 84  Temp(Src) 97.6 F (36.4 C) (Oral)  Resp 14  SpO2 98% Physical Exam  Nursing note and vitals reviewed. Constitutional: He is oriented to person, place, and time. He appears well-developed and well-nourished.  HENT:  Head: Normocephalic and atraumatic.  Cardiovascular: Normal rate, regular rhythm and normal heart sounds.   No murmur heard. Pulmonary/Chest: Effort normal and breath sounds normal.  Abdominal: Soft. Bowel sounds are normal. He exhibits no distension and no mass. There is no tenderness. There is no rebound and no guarding.  Musculoskeletal: Normal range of motion. He exhibits no edema and no tenderness.  Patient has had previous left below the knee amputation. No  tenderness to right hip area with range of motion intact, however patient states the pain was more severe after palpation. No rash. Neurovascular intact over right foot. Dorsalis pedis pulse intact on right. No edema of her right lower leg  Neurological: He is alert and oriented to person, place, and time. No cranial nerve deficit.  Skin: Skin is warm and dry.  Psychiatric: He has a normal mood and affect.    ED Course  Procedures (including critical care time) Labs Review Labs Reviewed - No data to display  Imaging Review Dg Hip Complete Right  01/13/2014   CLINICAL DATA:  HIP PAIN  EXAM: RIGHT HIP - COMPLETE 2+ VIEW  COMPARISON:  None.  FINDINGS: There  is no evidence of hip fracture or dislocation. Areas of hypertrophic spurring and periarticular sclerosis involving the femoral head and acetabulum. Atherosclerotic calcifications are appreciated. Soft tissues otherwise unremarkable. There is scattered areas of retained contrast within the colon greatest area of confluence in the rectosigmoid region.  IMPRESSION: Osteoarthritic changes without acute osseous abnormalities.   Electronically Signed   By: Margaree Mackintosh M.D.   On: 01/13/2014 12:21     EKG Interpretation None      MDM   Final diagnoses:  Hip pain    Patient with right hip pain. X-ray shows osteoarthritis. No rash, but shingles is been considered. Doubt arterial or venous occlusion. Will discharge home with some pain medication And followup.    Jasper Riling. Alvino Chapel, MD 01/13/14 [phone]

## 2014-01-13 NOTE — ED Notes (Signed)
Bed: WA10 Expected date:  Expected time:  Means of arrival:  Comments: EMS 

## 2014-01-13 NOTE — ED Provider Notes (Signed)
Patient was discharged earlier today from a [facility] facility. Apparently the prescription was not written in a way that the facility can accept. His prescription for Percocet was rewritten for the same quantity and the prescription will be faxed to this facility.  Veryl Speak, MD 01/13/14 2014

## 2014-01-13 NOTE — ED Notes (Addendum)
Per EMS pt from [facility] with right sided hip pain radiating down leg with no injury. Pt has below the knee left amputation. Pt uses walker to walk.

## 2014-01-13 NOTE — Discharge Instructions (Signed)
Hip Pain  The hips join the upper legs to the lower pelvis. The bones, cartilage, tendons, and muscles of the hip joint perform a lot of work each day holding your body weight and allowing you to move around.  Hip pain is a common symptom. It can range from a minor ache to severe pain on 1 or both hips. Pain may be felt on the inside of the hip joint near the groin, or the outside near the buttocks and upper thigh. There may be swelling or stiffness as well. It occurs more often when a person walks or performs activity. There are many reasons hip pain can develop.  CAUSES   It is important to work with your caregiver to identify the cause since many conditions can impact the bones, cartilage, muscles, and tendons of the hips. Causes for hip pain include:   Broken (fractured) bones.   Separation of the thighbone from the hip socket (dislocation).   Torn cartilage of the hip joint.   Swelling (inflammation) of a tendon (tendonitis), the sac within the hip joint (bursitis), or a joint.   A weakening in the abdominal wall (hernia), affecting the nerves to the hip.   Arthritis in the hip joint or lining of the hip joint.   Pinched nerves in the back, hip, or upper thigh.   A bulging disc in the spine (herniated disc).   Rarely, bone infection or cancer.  DIAGNOSIS   The location of your hip pain will help your caregiver understand what may be causing the pain. A diagnosis is based on your medical history, your symptoms, results from your physical exam, and results from diagnostic tests. Diagnostic tests may include X-ray exams, a computerized magnetic scan (magnetic resonance imaging, MRI), or bone scan.  TREATMENT   Treatment will depend on the cause of your hip pain. Treatment may include:   Limiting activities and resting until symptoms improve.   Crutches or other walking supports (a cane or brace).   Ice, elevation, and compression.   Physical therapy or home exercises.   Shoe inserts or special  shoes.   Losing weight.   Medications to reduce pain.   Undergoing surgery.  HOME CARE INSTRUCTIONS    Only take over-the-counter or prescription medicines for pain, discomfort, or fever as directed by your caregiver.   Put ice on the injured area:   Put ice in a plastic bag.   Place a towel between your skin and the bag.   Leave the ice on for 15-20 minutes at a time, 03-04 times a day.   Keep your leg raised (elevated) when possible to lessen swelling.   Avoid activities that cause pain.   Follow specific exercises as directed by your caregiver.   Sleep with a pillow between your legs on your most comfortable side.   Record how often you have hip pain, the location of the pain, and what it feels like. This information may be helpful to you and your caregiver.   Ask your caregiver about returning to work or sports and whether you should drive.   Follow up with your caregiver for further exams, therapy, or testing as directed.  SEEK MEDICAL CARE IF:    Your pain or swelling continues or worsens after 1 week.   You are feeling unwell or have chills.   You have increasing difficulty with walking.   You have a loss of sensation or other new symptoms.   You have questions or concerns.  SEEK   IMMEDIATE MEDICAL CARE IF:    You cannot put weight on the affected hip.   You have fallen.   You have a sudden increase in pain and swelling in your hip.   You have a fever.  MAKE SURE YOU:    Understand these instructions.   Will watch your condition.   Will get help right away if you are not doing well or get worse.  Document Released: 02/19/2010 Document Revised: 11/24/2011 Document Reviewed: 02/19/2010  [employer] Patient Information 2014 [employer].

## 2014-01-13 NOTE — ED Notes (Signed)
[facility] called to transport pt to [facility].

## 2014-01-15 ENCOUNTER — Other Ambulatory Visit: Payer: Self-pay

## 2014-01-15 ENCOUNTER — Inpatient Hospital Stay (HOSPITAL_COMMUNITY)
Admission: EM | Admit: 2014-01-15 | Discharge: 2014-01-24 | DRG: 689 | Disposition: A | Discharge status: Skilled Nursing Facility | Payer: Medicare Other | Attending: Internal Medicine | Admitting: Internal Medicine

## 2014-01-15 ENCOUNTER — Encounter (HOSPITAL_COMMUNITY): Payer: Self-pay | Admitting: Emergency Medicine

## 2014-01-15 ENCOUNTER — Emergency Department (HOSPITAL_COMMUNITY): Payer: Medicare Other

## 2014-01-15 DIAGNOSIS — M7989 Other specified soft tissue disorders: Secondary | ICD-10-CM

## 2014-01-15 DIAGNOSIS — R5381 Other malaise: Secondary | ICD-10-CM | POA: Diagnosis present

## 2014-01-15 DIAGNOSIS — M4712 Other spondylosis with myelopathy, cervical region: Secondary | ICD-10-CM | POA: Diagnosis present

## 2014-01-15 DIAGNOSIS — Z79899 Other long term (current) drug therapy: Secondary | ICD-10-CM

## 2014-01-15 DIAGNOSIS — I70269 Atherosclerosis of native arteries of extremities with gangrene, unspecified extremity: Secondary | ICD-10-CM

## 2014-01-15 DIAGNOSIS — I4891 Unspecified atrial fibrillation: Secondary | ICD-10-CM | POA: Diagnosis present

## 2014-01-15 DIAGNOSIS — G40209 Localization-related (focal) (partial) symptomatic epilepsy and epileptic syndromes with complex partial seizures, not intractable, without status epilepticus: Secondary | ICD-10-CM

## 2014-01-15 DIAGNOSIS — C712 Malignant neoplasm of temporal lobe: Secondary | ICD-10-CM

## 2014-01-15 DIAGNOSIS — Z531 Procedure and treatment not carried out because of patient's decision for reasons of belief and group pressure: Secondary | ICD-10-CM

## 2014-01-15 DIAGNOSIS — A498 Other bacterial infections of unspecified site: Secondary | ICD-10-CM | POA: Diagnosis present

## 2014-01-15 DIAGNOSIS — M79609 Pain in unspecified limb: Secondary | ICD-10-CM

## 2014-01-15 DIAGNOSIS — D631 Anemia in chronic kidney disease: Secondary | ICD-10-CM | POA: Diagnosis present

## 2014-01-15 DIAGNOSIS — M109 Gout, unspecified: Secondary | ICD-10-CM | POA: Diagnosis present

## 2014-01-15 DIAGNOSIS — I480 Paroxysmal atrial fibrillation: Secondary | ICD-10-CM

## 2014-01-15 DIAGNOSIS — I509 Heart failure, unspecified: Secondary | ICD-10-CM | POA: Diagnosis present

## 2014-01-15 DIAGNOSIS — Z7982 Long term (current) use of aspirin: Secondary | ICD-10-CM

## 2014-01-15 DIAGNOSIS — I10 Essential (primary) hypertension: Secondary | ICD-10-CM

## 2014-01-15 DIAGNOSIS — G9340 Encephalopathy, unspecified: Secondary | ICD-10-CM

## 2014-01-15 DIAGNOSIS — E119 Type 2 diabetes mellitus without complications: Secondary | ICD-10-CM

## 2014-01-15 DIAGNOSIS — M47817 Spondylosis without myelopathy or radiculopathy, lumbosacral region: Secondary | ICD-10-CM | POA: Diagnosis present

## 2014-01-15 DIAGNOSIS — M79604 Pain in right leg: Secondary | ICD-10-CM

## 2014-01-15 DIAGNOSIS — M169 Osteoarthritis of hip, unspecified: Secondary | ICD-10-CM | POA: Diagnosis present

## 2014-01-15 DIAGNOSIS — N039 Chronic nephritic syndrome with unspecified morphologic changes: Secondary | ICD-10-CM

## 2014-01-15 DIAGNOSIS — I200 Unstable angina: Secondary | ICD-10-CM

## 2014-01-15 DIAGNOSIS — E785 Hyperlipidemia, unspecified: Secondary | ICD-10-CM

## 2014-01-15 DIAGNOSIS — S88119A Complete traumatic amputation at level between knee and ankle, unspecified lower leg, initial encounter: Secondary | ICD-10-CM

## 2014-01-15 DIAGNOSIS — N390 Urinary tract infection, site not specified: Principal | ICD-10-CM | POA: Diagnosis present

## 2014-01-15 DIAGNOSIS — K219 Gastro-esophageal reflux disease without esophagitis: Secondary | ICD-10-CM

## 2014-01-15 DIAGNOSIS — I639 Cerebral infarction, unspecified: Secondary | ICD-10-CM

## 2014-01-15 DIAGNOSIS — M1610 Unilateral primary osteoarthritis, unspecified hip: Secondary | ICD-10-CM | POA: Diagnosis present

## 2014-01-15 DIAGNOSIS — N2581 Secondary hyperparathyroidism of renal origin: Secondary | ICD-10-CM | POA: Diagnosis present

## 2014-01-15 DIAGNOSIS — Z992 Dependence on renal dialysis: Secondary | ICD-10-CM

## 2014-01-15 DIAGNOSIS — IMO0001 Reserved for inherently not codable concepts without codable children: Secondary | ICD-10-CM

## 2014-01-15 DIAGNOSIS — I120 Hypertensive chronic kidney disease with stage 5 chronic kidney disease or end stage renal disease: Secondary | ICD-10-CM | POA: Diagnosis present

## 2014-01-15 DIAGNOSIS — Z9849 Cataract extraction status, unspecified eye: Secondary | ICD-10-CM

## 2014-01-15 DIAGNOSIS — G40909 Epilepsy, unspecified, not intractable, without status epilepticus: Secondary | ICD-10-CM

## 2014-01-15 DIAGNOSIS — C719 Malignant neoplasm of brain, unspecified: Secondary | ICD-10-CM

## 2014-01-15 DIAGNOSIS — N186 End stage renal disease: Secondary | ICD-10-CM

## 2014-01-15 DIAGNOSIS — R4701 Aphasia: Secondary | ICD-10-CM

## 2014-01-15 DIAGNOSIS — I739 Peripheral vascular disease, unspecified: Secondary | ICD-10-CM

## 2014-01-15 HISTORY — DX: End stage renal disease: Z99.2

## 2014-01-15 HISTORY — DX: End stage renal disease: N18.6

## 2014-01-15 LAB — VALPROIC ACID LEVEL: Valproic Acid Lvl: 54.9 ug/mL (ref 50.0–100.0)

## 2014-01-15 LAB — I-STAT ARTERIAL BLOOD GAS, ED
BICARBONATE: 25 meq/L — AB (ref 20.0–24.0)
O2 Saturation: 93 %
TCO2: 26 mmol/L (ref 0–100)
pCO2 arterial: 41.2 mmHg (ref 35.0–45.0)
pH, Arterial: 7.391 (ref 7.350–7.450)
pO2, Arterial: 66 mmHg — ABNORMAL LOW (ref 80.0–100.0)

## 2014-01-15 LAB — CBC
HCT: 31.6 % — ABNORMAL LOW (ref 39.0–52.0)
Hemoglobin: 10.5 g/dL — ABNORMAL LOW (ref 13.0–17.0)
MCH: 32.1 pg (ref 26.0–34.0)
MCHC: 33.2 g/dL (ref 30.0–36.0)
MCV: 96.6 fL (ref 78.0–100.0)
Platelets: 353 10*3/uL (ref 150–400)
RBC: 3.27 MIL/uL — AB (ref 4.22–5.81)
RDW: 15 % (ref 11.5–15.5)
WBC: 12.7 10*3/uL — AB (ref 4.0–10.5)

## 2014-01-15 LAB — URINALYSIS, ROUTINE W REFLEX MICROSCOPIC
Glucose, UA: NEGATIVE mg/dL
Ketones, ur: 40 mg/dL — AB
Nitrite: POSITIVE — AB
Protein, ur: >300 mg/dL — AB
Specific Gravity, Urine: 1.023 (ref 1.005–1.030)
Urobilinogen, UA: 1 mg/dL (ref 0.0–1.0)
pH: 5.5 (ref 5.0–8.0)

## 2014-01-15 LAB — BASIC METABOLIC PANEL
BUN: 66 mg/dL — ABNORMAL HIGH (ref 6–23)
CALCIUM: 10 mg/dL (ref 8.4–10.5)
CHLORIDE: 91 meq/L — AB (ref 96–112)
CO2: 23 meq/L (ref 19–32)
CREATININE: 11.57 mg/dL — AB (ref 0.50–1.35)
GFR calc non Af Amer: 4 mL/min — ABNORMAL LOW (ref >90)
GFR, EST AFRICAN AMERICAN: 4 mL/min — AB (ref >90)
Glucose, Bld: 119 mg/dL — ABNORMAL HIGH (ref 70–99)
Potassium: 5.3 mEq/L (ref 3.7–5.3)
SODIUM: 137 meq/L (ref 137–147)

## 2014-01-15 LAB — I-STAT CG4 LACTIC ACID, ED: LACTIC ACID, VENOUS: 1.32 mmol/L (ref 0.5–2.2)

## 2014-01-15 LAB — HEPATIC FUNCTION PANEL
ALBUMIN: 2.9 g/dL — AB (ref 3.5–5.2)
ALT: <5 U/L (ref 0–53)
AST: 16 U/L (ref 0–37)
Alkaline Phosphatase: 56 U/L (ref 39–117)
BILIRUBIN TOTAL: 0.3 mg/dL (ref 0.3–1.2)
Total Protein: 8 g/dL (ref 6.0–8.3)

## 2014-01-15 LAB — URINE MICROSCOPIC-ADD ON

## 2014-01-15 LAB — PRO B NATRIURETIC PEPTIDE: PRO B NATRI PEPTIDE: 1399 pg/mL — AB (ref 0–125)

## 2014-01-15 LAB — AMMONIA: AMMONIA: 30 umol/L (ref 11–60)

## 2014-01-15 LAB — GLUCOSE, CAPILLARY: Glucose-Capillary: 132 mg/dL — ABNORMAL HIGH (ref 70–99)

## 2014-01-15 MED ORDER — RENO CAPS 1 MG PO CAPS: 1.0000 mg | ORAL_CAPSULE | Freq: Every evening | ORAL | Status: DC

## 2014-01-15 MED ORDER — DEXTROSE 5 % IV SOLN
1.0000 g | Freq: Once | INTRAVENOUS | Status: AC
  Administered 2014-01-15: 1 g via INTRAVENOUS
  Filled 2014-01-15: qty 10

## 2014-01-15 MED ORDER — LANTHANUM CARBONATE 500 MG PO CHEW: 2000.0000 mg | CHEWABLE_TABLET | Freq: Three times a day (TID) | ORAL | Status: DC

## 2014-01-15 MED ORDER — INSULIN ASPART 100 UNIT/ML ~~LOC~~ SOLN
0.0000 [IU] | Freq: Three times a day (TID) | SUBCUTANEOUS | Status: DC
  Administered 2014-01-16 – 2014-01-17 (×2): 1 [IU] via SUBCUTANEOUS
  Administered 2014-01-17: 2 [IU] via SUBCUTANEOUS
  Administered 2014-01-18 – 2014-01-19 (×2): 1 [IU] via SUBCUTANEOUS
  Administered 2014-01-20: 2 [IU] via SUBCUTANEOUS
  Administered 2014-01-22: 1 [IU] via SUBCUTANEOUS
  Administered 2014-01-22 – 2014-01-23 (×2): 2 [IU] via SUBCUTANEOUS
  Administered 2014-01-23: 3 [IU] via SUBCUTANEOUS
  Administered 2014-01-23: 2 [IU] via SUBCUTANEOUS

## 2014-01-15 MED ORDER — ESCITALOPRAM OXALATE 10 MG PO TABS
10.0000 mg | ORAL_TABLET | Freq: Every day | ORAL | Status: DC
  Administered 2014-01-16 – 2014-01-24 (×9): 10 mg via ORAL
  Filled 2014-01-15 (×9): qty 1

## 2014-01-15 MED ORDER — LANTHANUM CARBONATE 500 MG PO CHEW
2000.0000 mg | CHEWABLE_TABLET | Freq: Three times a day (TID) | ORAL | Status: DC
  Administered 2014-01-16 – 2014-01-23 (×19): 2000 mg via ORAL
  Filled 2014-01-15 (×28): qty 4

## 2014-01-15 MED ORDER — SIMETHICONE 80 MG PO CHEW
80.0000 mg | CHEWABLE_TABLET | Freq: Two times a day (BID) | ORAL | Status: DC | PRN
  Filled 2014-01-15 (×2): qty 1

## 2014-01-15 MED ORDER — METOPROLOL TARTRATE 25 MG/10 ML ORAL SUSPENSION
6.2500 mg | Freq: Two times a day (BID) | ORAL | Status: DC
  Administered 2014-01-15 – 2014-01-24 (×16): 6.25 mg via ORAL
  Filled 2014-01-15 (×19): qty 2.5

## 2014-01-15 MED ORDER — DOCUSATE SODIUM 100 MG PO CAPS
100.0000 mg | ORAL_CAPSULE | Freq: Every day | ORAL | Status: DC
  Administered 2014-01-15 – 2014-01-24 (×10): 100 mg via ORAL
  Filled 2014-01-15 (×10): qty 1

## 2014-01-15 MED ORDER — SENNOSIDES-DOCUSATE SODIUM 8.6-50 MG PO TABS
1.0000 | ORAL_TABLET | Freq: Every evening | ORAL | Status: DC | PRN
  Filled 2014-01-15: qty 1

## 2014-01-15 MED ORDER — ATORVASTATIN CALCIUM 10 MG PO TABS
30.0000 mg | ORAL_TABLET | Freq: Every day | ORAL | Status: DC
  Administered 2014-01-15 – 2014-01-23 (×9): 30 mg via ORAL
  Filled 2014-01-15 (×10): qty 1

## 2014-01-15 MED ORDER — METOPROLOL TARTRATE 25 MG PO TABS: 6.2500 mg | ORAL_TABLET | Freq: Two times a day (BID) | ORAL | Status: DC

## 2014-01-15 MED ORDER — OMEPRAZOLE MAGNESIUM 20 MG PO TBEC
20.0000 mg | DELAYED_RELEASE_TABLET | Freq: Every day | ORAL | Status: DC | PRN
Start: 2014-01-15 — End: 2014-01-15

## 2014-01-15 MED ORDER — PANTOPRAZOLE SODIUM 40 MG PO TBEC
40.0000 mg | DELAYED_RELEASE_TABLET | Freq: Every day | ORAL | Status: DC | PRN
  Administered 2014-01-19: 40 mg via ORAL
  Filled 2014-01-15: qty 1

## 2014-01-15 MED ORDER — FOLIC ACID 1 MG PO TABS
1.0000 mg | ORAL_TABLET | Freq: Every day | ORAL | Status: DC
  Administered 2014-01-16 – 2014-01-23 (×8): 1 mg via ORAL
  Filled 2014-01-15 (×9): qty 1

## 2014-01-15 MED ORDER — DIVALPROEX SODIUM 500 MG PO DR TAB
1500.0000 mg | DELAYED_RELEASE_TABLET | Freq: Every day | ORAL | Status: DC
  Administered 2014-01-16 – 2014-01-24 (×9): 1500 mg via ORAL
  Filled 2014-01-15 (×9): qty 3

## 2014-01-15 MED ORDER — DEXTROSE 5 % IV SOLN
1.0000 g | INTRAVENOUS | Status: DC
  Administered 2014-01-17 – 2014-01-23 (×7): 1 g via INTRAVENOUS
  Filled 2014-01-15 (×11): qty 10

## 2014-01-15 MED ORDER — CARBIDOPA-LEVODOPA 25-100 MG PO TABS
0.5000 | ORAL_TABLET | Freq: Three times a day (TID) | ORAL | Status: DC
  Administered 2014-01-15 – 2014-01-16 (×4): 0.5 via ORAL
  Administered 2014-01-17 (×2): via ORAL
  Administered 2014-01-18 – 2014-01-23 (×16): 0.5 via ORAL
  Filled 2014-01-15 (×28): qty 0.5

## 2014-01-15 MED ORDER — B COMPLEX-C PO TABS
1.0000 | ORAL_TABLET | Freq: Every day | ORAL | Status: DC
  Administered 2014-01-16 – 2014-01-23 (×8): 1 via ORAL
  Filled 2014-01-15 (×9): qty 1

## 2014-01-15 MED ORDER — NITROGLYCERIN 0.4 MG SL SUBL: 0.4000 mg | SUBLINGUAL_TABLET | SUBLINGUAL | Status: DC | PRN

## 2014-01-15 MED ORDER — ASPIRIN EC 325 MG PO TBEC
325.0000 mg | DELAYED_RELEASE_TABLET | Freq: Every day | ORAL | Status: DC
  Administered 2014-01-16 – 2014-01-24 (×9): 325 mg via ORAL
  Filled 2014-01-15 (×9): qty 1

## 2014-01-15 NOTE — ED Notes (Signed)
Dr. Kakrakandy at bedside. 

## 2014-01-15 NOTE — H&P (Signed)
[facility] History and Physical  [name] GUY:[id number] DOB: [birth year] DOA: 01/15/2014  Referring physician: ER physician. PCP: Donetta Potts, MD   Chief Complaint: Right lower the pain and confusion.  HPI: [name] [name] is a 73 y.o. male with history of ESRD on hemodialysis on Tuesday Thursday and Saturday, diabetes mellitus type, peripheral vascular disease, left BKA, seizures was brought to the ER patient was complaining of persistent pain in the right lower extremity. Patient had come to the ER 2 days ago and had complained of right hip pain and x-ray showed osteoarthritis changes and patient was discharged to the rehabilitation on Percocet. Last 2 days patient has continued to have pain and patient states the pain radiates all the way to the right ankle. In the ER Dopplers negative for DVT but ABI was unable to be completed due to calcification. Patient is able to move extremities and has mild edema but no acute findings for ischemia. In addition patient's wife has noticed that patient has become increasingly confused talking out of his head and lethargic last 2 days. In the ER UA shows features concerning for UTI. CT of the head is negative and patient has been on med for further management. Patient was recently admitted last month for diarrhea and transient aphasia.  Review of Systems: As presented in the history of presenting illness, rest negative.  Past Medical History  Diagnosis Date  . Dialysis patient   . Peripheral vascular disease   . ESRD (end stage renal disease) on dialysis 09/2010    MWF  . Hypertension   . Hyperlipidemia   . Arthritis     Gout  . Paroxysmal atrial fibrillation   . Brain tumor 2009  . Seizure disorder 2009  . Anemia   . Hyperparathyroidism, secondary   . Diabetes mellitus without complication     Type II, diet controlled  . Proteus mirabilis infection   . CHF (congestive heart failure)     grade 1 DD, preserved EF, [facility] records   . 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 brain tumor  . GERD (gastroesophageal reflux disease)   . Hx of cardiac cath     a. LHC (9/14):  Normal cors, EF 65%.    Past Surgical History  Procedure Laterality Date  . Av fistula placement Left     arm  . 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 Eye surgery Bilateral 2000    cataract  . Retinopathy surgery      Hx. of laser treatments for diabetics   Social History:  reports that he has never smoked. He has never used smokeless tobacco. He reports that he drinks about .6 ounces of alcohol per week. He reports that he does not use illicit drugs. Where does patient live nursing home. Can patient participate in ADLs? Unsure.  No Known Allergies  Family History:  Family History  Problem Relation Age of Onset  . Seizures Mother   . Stroke Mother 15  . Hypertension Mother   . Alcohol abuse Father   . Hypertension Father   . Arthritis Sister   . Heart attack Neg Hx   . Heart failure Neg Hx       Prior to Admission medications   Medication Sig Start Date End Date Taking? Authorizing Provider  acetaminophen (TYLENOL) 325 MG tablet Take 325 mg by mouth every 6 (six) hours as  needed for moderate pain or fever.   Yes Historical Provider, MD  aspirin EC 325 MG tablet Take 325 mg by mouth daily.   Yes Historical Provider, MD  atorvastatin (LIPITOR) 20 MG tablet Take 30 mg by mouth at bedtime.   Yes Historical Provider, MD  B Complex-C-Folic Acid (RENO CAPS) 1 MG CAPS Take 1 mg by mouth every evening. 8pm   Yes Historical Provider, MD  carbidopa-levodopa (SINEMET IR) 25-100 MG per tablet Take 0.5 tablets by mouth 3 (three) times daily.   Yes Historical Provider, MD  divalproex (DEPAKOTE) 500 MG DR tablet Take 1,500 mg by mouth daily.   Yes Historical Provider, MD  docusate sodium (COLACE) 100 MG capsule Take 100 mg by mouth at bedtime.     Yes Historical Provider, MD  escitalopram (LEXAPRO) 10 MG tablet Take 10 mg by mouth daily.   Yes Historical Provider, MD  gabapentin (NEURONTIN) 300 MG capsule Take 300 mg by mouth at bedtime.   Yes Historical Provider, MD  lanthanum (FOSRENOL) 1000 MG chewable tablet Chew 2,000 mg by mouth 3 (three) times daily with meals.   Yes Historical Provider, MD  metoprolol tartrate (LOPRESSOR) 25 MG tablet Take 6.25 mg by mouth 2 (two) times daily. Take 6.25mg  by mouth daily   Yes Historical Provider, MD  nitroGLYCERIN (NITROSTAT) 0.4 MG SL tablet Place 1 tablet (0.4 mg total) under the tongue every 5 (five) minutes x 3 doses as needed for chest pain. 05/28/13  Yes Nita Sells, MD  omeprazole (PRILOSEC OTC) 20 MG tablet Take 20 mg by mouth daily as needed (heartburn).    Yes Historical Provider, MD  oxyCODONE-acetaminophen (PERCOCET/ROXICET) 5-325 MG per tablet Take 1-2 tablets by mouth every 6 (six) hours as needed for severe pain. 01/13/14  Yes Jasper Riling. Alvino Chapel, MD  simethicone (MYLICON) 80 MG chewable tablet Chew 80 mg by mouth 2 (two) times daily as needed for flatulence.   Yes Historical Provider, MD    Physical Exam: Filed Vitals:   01/15/14 1322 01/15/14 1622 01/15/14 1727  BP: 101/60 138/66   Pulse: 72 75   Temp: 97.9 F (36.6 C) 98.1 F (36.7 C) 97.8 F (36.6 C)  TempSrc: Oral Oral Rectal  Resp: 14 18   SpO2: 95% 96%      General:  Well-developed and nourished.  Eyes: Anicteric no pallor.  ENT: No discharge from the ears eyes nose mouth.  Neck: No mass felt.  Cardiovascular: S1-S2 heard.  Respiratory: No rhonchi or crepitations.  Abdomen: Soft nontender bowel sounds present. No guarding or rigidity.  Skin: No obvious rash.  Musculoskeletal: Right lower extremities mildly swollen but no acute ischemic changes.  Psychiatric: Appears normal but lethargic.  Neurologic: Mildly lethargic but answers questions. Moves all extremities.  Labs on Admission:  Basic  Metabolic Panel:  Recent Labs Lab 01/15/14 1358  NA 137  K 5.3  CL 91*  CO2 23  GLUCOSE 119*  BUN 66*  CREATININE 11.57*  CALCIUM 10.0   Liver Function Tests:  Recent Labs Lab 01/15/14 1717  AST 16  ALT <5  ALKPHOS 56  BILITOT 0.3  PROT 8.0  ALBUMIN 2.9*   No results found for this basename: LIPASE, AMYLASE,  in the last 168 hours  Recent Labs Lab 01/15/14 1717  AMMONIA 30   CBC:  Recent Labs Lab 01/15/14 1358  WBC 12.7*  HGB 10.5*  HCT 31.6*  MCV 96.6  PLT 353   Cardiac Enzymes: No results found for this basename: CKTOTAL,  CKMB, CKMBINDEX, TROPONINI,  in the last 168 hours  BNP (last 3 results)  Recent Labs  05/26/13 1700 01/15/14 1316  PROBNP 699.3* 1399.0*   CBG: No results found for this basename: GLUCAP,  in the last 168 hours  Radiological Exams on Admission: Dg Chest 2 View  01/15/2014   CLINICAL DATA:  Leg swelling.  Lethargy.  EXAM: CHEST  2 VIEW  COMPARISON:  09/30/2013.  FINDINGS: Poor inspiration. Mild bibasilar atelectasis. Grossly normal sized heart. Thoracic spine degenerative changes.  IMPRESSION: Poor inspiration with mild bibasilar atelectasis.   Electronically Signed   By: Enrique Sack M.D.   On: 01/15/2014 14:36   Ct Head Wo Contrast  01/15/2014   CLINICAL DATA:  Head pain  EXAM: CT HEAD WITHOUT CONTRAST  TECHNIQUE: Contiguous axial images were obtained from the base of the skull through the vertex without intravenous contrast.  COMPARISON:  Brain MRI, 12/18/2013 Hand head CT, 09/08/13.  FINDINGS: Ventricles are normal in configuration. There is ventricular and sulcal enlargement reflecting mild to moderate atrophy  No parenchymal masses or mass effect. There is no evidence of a recent infarct. Encephalomalacia along the right temporal lobe is stable due to a previous right frontotemporal craniotomy.  No extra-axial masses or abnormal fluid collections.  There is no intracranial hemorrhage right craniotomy changes are stable. Minimal  maxillary sinus mucosal thickening. Remaining sinuses and mastoid air cells are clear.  IMPRESSION: 1. No acute intracranial abnormalities. No change from the prior study.   Electronically Signed   By: Lajean Manes M.D.   On: 01/15/2014 19:10    EKG: Independently reviewed. Normal sinus rhythm with sinus arrhythmia.  Assessment/Plan Principal Problem:   Acute encephalopathy Active Problems:   ESRD (end stage renal disease) on dialysis   Paroxysmal atrial fibrillation   UTI (lower urinary tract infection)   Lower extremity pain, right   1. Acute encephalopathy - differentials include possibly secondary to recent addition of pain medication and UTI. Since patient has had previous episodes of stroke and seizures I have ordered an EEG and MRI brain and until then patient will be placed on neurochecks and swallow evaluation. Ammonia levels have been normal but check Depakote and ABG. I'm holding off patient's Percocet and gabapentin for now. 2. Right lower extremity pain - differentials include osteoarthritis of the right hip but patient's pain radiates all the way to the ankle so I have ordered MRI of the L-spine and also may consult with vascular surgeon in a.m. 3. UTI - follow urine cultures. On ceftriaxone. 4. ESRD on hemodialysis - left message for dialysis. Usually done on Tuesday Thursdays and Saturday. 5. Seizure disorder - check Depakote level. 6. Chronic anemia - probably from ESRD. Follow CBC.    Code Status: Full code.  Family Communication: Patient's wife.  Disposition Plan: Admit to inpatient.    [facility] Pager [phone].  If 7PM-7AM, please contact night-coverage [email or web address] Password [email or web address] 01/15/2014, 8:40 PM

## 2014-01-15 NOTE — ED Notes (Addendum)
Pt called ems today for RLE swelling after waking, he states he slept with his leg hanging off the bed. He states hes having mild pain in the leg and his head hurts too. He is alert, en route VSS. He has a left side BKA. He is a dialysis pt, states he had all his treatments this week

## 2014-01-15 NOTE — ED Provider Notes (Signed)
73 y.o. Male on dialysis presents with ams.  Patient also has had complaint so right lower extremity pain.  Patient with uti and treated with rocephin.  Lower extremity with dopplerable pulses- study obtained to rule out dvt and acute ischemia.   I performed a history and physical examination of [name] [name] and discussed his management with Mr. Rona Ravens.  I agree with the history, physical, assessment, and plan of care, with the following exceptions: None  I was present for the following procedures: None Time Spent in Critical Care of the patient: None Time spent in discussions with the patient and family: [place], MD 01/15/14 2016

## 2014-01-15 NOTE — Progress Notes (Signed)
VASCULAR LAB PRELIMINARY  PRELIMINARY  PRELIMINARY  PRELIMINARY  Right lower extremity venous Doppler completed.    Preliminary report:  There is no DVT or SVT noted in the right lower extremity.  Iantha Fallen, RVT 01/15/2014, 6:33 PM

## 2014-01-15 NOTE — Progress Notes (Signed)
ANTIBIOTIC CONSULT NOTE - INITIAL  Pharmacy Consult for Rocephin Indication: UTI  No Known Allergies  Patient Measurements: Height: 6\' 4"  (193 cm) Weight: 265 lb 6.4 oz (120.385 kg) IBW/kg (Calculated) : 86.8  Vital Signs: Temp: 97.8 F (36.6 C) (05/03 2137) Temp src: Rectal (05/03 1727) BP: 144/73 mmHg (05/03 2137) Pulse Rate: 79 (05/03 2137) Intake/Output from previous day:   Intake/Output from this shift:    Labs:  Recent Labs  01/15/14 1358  WBC 12.7*  HGB 10.5*  PLT 353  CREATININE 11.57*   Estimated Creatinine Clearance: 8.1 ml/min (by C-G formula based on Cr of 11.57). No results found for this basename: VANCOTROUGH, Corlis Leak, VANCORANDOM, [place], GENTPEAK, GENTRANDOM, TOBRATROUGH, TOBRAPEAK, TOBRARND, AMIKACINPEAK, AMIKACINTROU, AMIKACIN,  in the last 72 hours   Microbiology: Recent Results (from the past 720 hour(s))  MRSA PCR SCREENING     Status: None   Collection Time    12/17/13  5:22 AM      Result Value Ref Range Status   MRSA by PCR NEGATIVE  NEGATIVE Final   Comment:            The GeneXpert MRSA Assay (FDA     approved for NASAL specimens     only), is one component of a     comprehensive MRSA colonization     surveillance program. It is not     intended to diagnose MRSA     infection nor to guide or     monitor treatment for     MRSA infections.    Medical History: Past Medical History  Diagnosis Date  . Dialysis patient   . Peripheral vascular disease   . ESRD (end stage renal disease) on dialysis 09/2010    MWF  . Hypertension   . Hyperlipidemia   . Arthritis     Gout  . Paroxysmal atrial fibrillation   . Brain tumor 2009  . Seizure disorder 2009  . Anemia   . Hyperparathyroidism, secondary   . Diabetes mellitus without complication     Type II, diet controlled  . Proteus mirabilis infection   . CHF (congestive heart failure)     grade 1 DD, preserved EF, [facility] records  . Mixed oligoastrocytoma 2009    with  resection.  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 brain tumor  . GERD (gastroesophageal reflux disease)   . Hx of cardiac cath     a. LHC (9/14):  Normal cors, EF 65%.     Assessment: 2 YOM presented with leg pain and swelling, UA concerning for UTI, urine culture sent, pharmacy is consulted to start rocephin for UTI, he received a dose at 2100 while in the ED  Goal of Therapy:  Resolution of infection.   Plan:  - Rocephin 1g IV Q 24 hrs, next dose tomorrow evening - No renal adjustment needed, Pharmacy will sign off.  Maryanna Shape, PharmD, BCPS  Clinical Pharmacist  Pager: [phone]   01/15/2014,9:52 PM

## 2014-01-15 NOTE — ED Notes (Signed)
Pt stating he is able to use urinal to void.  Urinal given to pt.  Wife at bedside assisting pt to void for UA.

## 2014-01-15 NOTE — ED Notes (Signed)
Report called to Janice, RN

## 2014-01-15 NOTE — ED Notes (Signed)
Attempted IV x 2 without success.  IV team paged.

## 2014-01-15 NOTE — ED Provider Notes (Signed)
CSN: [id number]     Arrival date & time 01/15/14  1310 History   First MD Initiated Contact with Patient 01/15/14 1611     Chief Complaint  Patient presents with  . Leg Swelling     (Consider location/radiation/quality/duration/timing/severity/associated sxs/prior Treatment) HPI  73 year old male with history of end-stage renal disease currently on TTS dialysis, history of seizure, CVA,  brain tumor, non-insulin-dependent diabetes and history of heart disease who presents with initial complaints of right leg pain. History was limited as patient appears to be drowsy and is a poor historian. Wife is at bedside. Patient lives at an assisted facility. He has been complaining of persistent pain to his right leg ongoing for the past 6 days. He is unable to specify where and his leg is having the pain at but states that it severe, worsening when hanging on the side of the bed. Was seen in the ED 2 days ago for the same complaint and was diagnosed with having right hip osteoarthritis and was prescribed pain medication. The facility sent him here because he appears to be will drowsy than usual especially in the past 2 days. His last dose of pain medication was 8:00 AM today and was 1 Percocet. Patient currently states that movement makes his leg hurts. He denies any fever, chills, headache, lightheadedness, chest pain, shortness of breath, abdominal pain, dysuria, back pain, new numbness. However one moment he mentioned that he has leg pain in the next moment he mentioned that he is not here for leg pain but for leg swelling. Denies any increased redness of breath or productive cough.   Past Medical History  Diagnosis Date  . Dialysis patient   . Peripheral vascular disease   . ESRD (end stage renal disease) on dialysis 09/2010    MWF  . Hypertension   . Hyperlipidemia   . Arthritis     Gout  . Paroxysmal atrial fibrillation   . Brain tumor 2009  . Seizure disorder 2009  . Anemia   .  Hyperparathyroidism, secondary   . Diabetes mellitus without complication     Type II, diet controlled  . Proteus mirabilis infection   . CHF (congestive heart failure)     grade 1 DD, preserved EF, [facility] records  . 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 brain tumor  . GERD (gastroesophageal reflux disease)   . Hx of cardiac cath     a. LHC (9/14):  Normal cors, EF 65%.    Past Surgical History  Procedure Laterality Date  . Av fistula placement Left     arm  . 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 Eye surgery Bilateral 2000    cataract  . Retinopathy surgery      Hx. of laser treatments for diabetics   Family History  Problem Relation Age of Onset  . Seizures Mother   . Stroke Mother 40  . Hypertension Mother   . Alcohol abuse Father   . Hypertension Father   . Arthritis Sister   . Heart attack Neg Hx   . Heart failure Neg Hx    History  Substance Use Topics  . Smoking status: Never Smoker   . Smokeless tobacco: Never Used  . Alcohol Use: 0.6 oz/week    1 Shots of liquor per week    Review of Systems  Unable to perform ROS: Mental status  change      Allergies  Review of patient's allergies indicates no known allergies.  Home Medications   Prior to Admission medications   Medication Sig Start Date End Date Taking? Authorizing Provider  acetaminophen (TYLENOL) 325 MG tablet Take 325 mg by mouth every 6 (six) hours as needed for moderate pain or fever.    Historical Provider, MD  aspirin EC 325 MG tablet Take 325 mg by mouth daily.    Historical Provider, MD  atorvastatin (LIPITOR) 20 MG tablet Take 30 mg by mouth at bedtime.    Historical Provider, MD  B Complex-C-Folic Acid (RENO CAPS) 1 MG CAPS Take 1 mg by mouth every evening. 8pm    Historical Provider, MD  carbidopa-levodopa (SINEMET IR) 25-100 MG per tablet Take 0.5 tablets by mouth 3 (three) times  daily.    Historical Provider, MD  divalproex (DEPAKOTE) 500 MG DR tablet Take 1,500 mg by mouth daily.    Historical Provider, MD  docusate sodium (COLACE) 100 MG capsule Take 100 mg by mouth at bedtime.     Historical Provider, MD  escitalopram (LEXAPRO) 10 MG tablet Take 10 mg by mouth daily.    Historical Provider, MD  gabapentin (NEURONTIN) 300 MG capsule Take 300 mg by mouth at bedtime.    Historical Provider, MD  lanthanum (FOSRENOL) 1000 MG chewable tablet Chew 2,000 mg by mouth 3 (three) times daily with meals.    Historical Provider, MD  metoprolol tartrate (LOPRESSOR) 25 MG tablet Take 6.25 mg by mouth 2 (two) times daily. Take 6.25mg  by mouth daily    Historical Provider, MD  nitroGLYCERIN (NITROSTAT) 0.4 MG SL tablet Place 1 tablet (0.4 mg total) under the tongue every 5 (five) minutes x 3 doses as needed for chest pain. 05/28/13   Nita Sells, MD  omeprazole (PRILOSEC OTC) 20 MG tablet Take 20 mg by mouth daily as needed (heartburn).     Historical Provider, MD  oxyCODONE-acetaminophen (PERCOCET/ROXICET) 5-325 MG per tablet Take 1-2 tablets by mouth every 6 (six) hours as needed for severe pain. 01/13/14   Jasper Riling. Alvino Chapel, MD  simethicone (MYLICON) 80 MG chewable tablet Chew 80 mg by mouth 2 (two) times daily as needed for flatulence.    Historical Provider, MD   BP 138/66  Pulse 75  Temp(Src) 98.1 F (36.7 C) (Oral)  Resp 18  SpO2 96% Physical Exam  Nursing note and vitals reviewed. Constitutional: He appears well-developed and well-nourished. He appears lethargic. No distress (Patient in no acute distress however appears drowsy).  HENT:  Head: Normocephalic and atraumatic.  Mouth/Throat: Oropharynx is clear and moist.  Eyes: Conjunctivae are normal.  Pinpoint pupils bilaterally, reactive  Neck: Normal range of motion. Neck supple.  No nuchal rigidity  Cardiovascular: Normal rate and regular rhythm.   Pulmonary/Chest: Effort normal and breath sounds normal. He has  no wheezes. He has no rales.  Abdominal: Soft. There is no tenderness.  Musculoskeletal: He exhibits edema (Right lower extremities with trace pitting edema.).  Left BKA  Right leg with no focal point tenderness, full passive range of motion without deformity or crepitus noted. No palpable cord, no erythema, mild pitting edema. Dorsalis pedis difficult to palpate however detectable with bedside venous Doppler  Neurological: He appears lethargic. No cranial nerve deficit or sensory deficit. GCS eye subscore is 4. GCS verbal subscore is 5. GCS motor subscore is 6.   Patient alert to self, mistakenly thought it was June istead of May, and thought it was 2016  instead of 2015. Able to recall current president, and aware of location.  Skin: No rash noted.    ED Course  Procedures (including critical care time)  5:02 PM Patient initially complaint was right leg pain however he has no focal point tenderness on his right leg exam including the hips. He does have mild edema and it difficult to palpate dorsalis pedis pulse.  Pain may suggest of claudication given his hx of HLD. Will obtain both venous doppler study to r/o DVT, and an arterial study to r/o PAD.  Will obtain head CT as well given AMS and hx of brain tumor.  Care discussed with Dr. Jeanell Sparrow.    7:39 PM UA with evidence suggestive of urinary tract infection. Rocephin given. Urine culture sent. His lactic acid is normal, normal ammonia, mild elevated WBC of 12.7, elevated pro BNP of 1399, chest x-ray shows no significant pulmonary edema. Head CT scan shows no acute changes. Venous Doppler study without evidence of DVT. Arterial vascular study is equivocal, no clear impression was made due to significant calcification of the vessel. At this time our plan is to have patient admitted for UTI with altered mental status. Patient may need further evaluation by vascular surgery for his suspected claudication.  7:52 PM I have consulted with [facility] Dr. Hal Hope who agrees to admit pt to tele, team 10, under his care.  He also request for ABG.    Labs Review Labs Reviewed  CBC - Abnormal; Notable for the following:    WBC 12.7 (*)    RBC 3.27 (*)    Hemoglobin 10.5 (*)    HCT 31.6 (*)    All other components within normal limits  BASIC METABOLIC PANEL - Abnormal; Notable for the following:    Chloride 91 (*)    Glucose, Bld 119 (*)    BUN 66 (*)    Creatinine, Ser 11.57 (*)    GFR calc non Af Amer 4 (*)    GFR calc Af Amer 4 (*)    All other components within normal limits  PRO B NATRIURETIC PEPTIDE - Abnormal; Notable for the following:    Pro B Natriuretic peptide (BNP) 1399.0 (*)    All other components within normal limits  HEPATIC FUNCTION PANEL - Abnormal; Notable for the following:    Albumin 2.9 (*)    All other components within normal limits  URINALYSIS, ROUTINE W REFLEX MICROSCOPIC - Abnormal; Notable for the following:    Color, Urine RED (*)    APPearance TURBID (*)    Hgb urine dipstick LARGE (*)    Bilirubin Urine LARGE (*)    Ketones, ur 40 (*)    Protein, ur >300 (*)    Nitrite POSITIVE (*)    Leukocytes, UA LARGE (*)    All other components within normal limits  URINE MICROSCOPIC-ADD ON - Abnormal; Notable for the following:    Bacteria, UA FEW (*)    All other components within normal limits  URINE CULTURE  AMMONIA  BLOOD GAS, ARTERIAL  I-STAT CG4 LACTIC ACID, ED    Imaging Review Dg Chest 2 View  01/15/2014   CLINICAL DATA:  Leg swelling.  Lethargy.  EXAM: CHEST  2 VIEW  COMPARISON:  09/30/2013.  FINDINGS: Poor inspiration. Mild bibasilar atelectasis. Grossly normal sized heart. Thoracic spine degenerative changes.  IMPRESSION: Poor inspiration with mild bibasilar atelectasis.   Electronically Signed   By: Enrique Sack M.D.   On: 01/15/2014 14:36   Ct  Head Wo Contrast  01/15/2014   CLINICAL DATA:  Head pain  EXAM: CT HEAD WITHOUT CONTRAST  TECHNIQUE: Contiguous axial images were  obtained from the base of the skull through the vertex without intravenous contrast.  COMPARISON:  Brain MRI, 12/18/2013 Hand head CT, 09/08/13.  FINDINGS: Ventricles are normal in configuration. There is ventricular and sulcal enlargement reflecting mild to moderate atrophy  No parenchymal masses or mass effect. There is no evidence of a recent infarct. Encephalomalacia along the right temporal lobe is stable due to a previous right frontotemporal craniotomy.  No extra-axial masses or abnormal fluid collections.  There is no intracranial hemorrhage right craniotomy changes are stable. Minimal maxillary sinus mucosal thickening. Remaining sinuses and mastoid air cells are clear.  IMPRESSION: 1. No acute intracranial abnormalities. No change from the prior study.   Electronically Signed   By: Lajean Manes M.D.   On: 01/15/2014 19:10   Ridley, Schewe Male 03-08-1941 [id number]            Progress Notes by Iantha Fallen, RVS at 01/15/2014 6:34 PM    Author: Iantha Fallen, RVS Service: Vascular Lab Author Type: Cardiovascular Sonographer   Filed: 01/15/2014 6:36 PM Note Time: 01/15/2014 6:34 PM Status: Signed   Editor: Iantha Fallen, RVS (Cardiovascular Sonographer)      VASCULAR LAB  PRELIMINARY  ARTERIAL  ABI completed:Unable to ascertain ABI secondary to calcified vessels. Duplex imaging reveals moderate plaque throughout right lower extremity arterial system with biphasic waveforms.      RIGHT    LEFT       PRESSURE  WAVEFORM   PRESSURE  WAVEFORM     BRACHIAL  160  B  BRACHIAL       DP    DP  BKA      AT  >300  M  AT  BKA      PT  >300   PT  BKA      PER    PER  BKA      GREAT TOE   NA  GREAT TOE   NA      RIGHT  LEFT     ABI  calcified      Iantha Fallen, RVT  01/15/2014, 6:34 PM    Taiven, Greenley Male 11-Dec-1940 [id number]            Progress Notes by Iantha Fallen, RVS at 01/15/2014 6:33 PM    Author: Iantha Fallen, RVS Service: Vascular Lab Author Type:  Cardiovascular Sonographer   Filed: 01/15/2014 6:34 PM Note Time: 01/15/2014 6:33 PM Status: Signed   Editor: Iantha Fallen, RVS (Cardiovascular Sonographer)      VASCULAR LAB  PRELIMINARY PRELIMINARY PRELIMINARY PRELIMINARY  Right lower extremity venous Doppler completed.  Preliminary report: There is no DVT or SVT noted in the right lower extremity.  Iantha Fallen, RVT  01/15/2014, 6:33 PM      EKG Interpretation None      MDM   Final diagnoses:  UTI (urinary tract infection)  ESRD (end stage renal disease) on dialysis    BP 138/66  Pulse 75  Temp(Src) 97.8 F (36.6 C) (Rectal)  Resp 18  SpO2 96%  I have reviewed nursing notes and vital signs. I personally reviewed the imaging tests through PACS system  I reviewed available ER/hospitalization records thought the EMR     Domenic Moras, [place] 01/15/14 1952

## 2014-01-15 NOTE — ED Notes (Signed)
Report called to Kelly, RN

## 2014-01-15 NOTE — Progress Notes (Signed)
VASCULAR LAB PRELIMINARY  ARTERIAL  ABI completed:Unable to ascertain ABI secondary to calcified vessels.  Duplex imaging reveals moderate plaque throughout right lower extremity arterial system with biphasic waveforms.    RIGHT    LEFT    PRESSURE WAVEFORM  PRESSURE WAVEFORM  BRACHIAL 160 B BRACHIAL    DP   DP BKA   AT >300 M AT BKA   PT >300  PT BKA   PER   PER BKA   GREAT TOE  NA GREAT TOE  NA    RIGHT LEFT  ABI calcified      Iantha Fallen, RVT 01/15/2014, 6:34 PM

## 2014-01-16 ENCOUNTER — Inpatient Hospital Stay (HOSPITAL_COMMUNITY): Payer: Medicare Other

## 2014-01-16 ENCOUNTER — Encounter (HOSPITAL_COMMUNITY): Payer: Self-pay | Admitting: Nephrology

## 2014-01-16 DIAGNOSIS — I10 Essential (primary) hypertension: Secondary | ICD-10-CM

## 2014-01-16 DIAGNOSIS — M79609 Pain in unspecified limb: Secondary | ICD-10-CM

## 2014-01-16 LAB — CBC WITH DIFFERENTIAL/PLATELET
BASOS ABS: 0 10*3/uL (ref 0.0–0.1)
Basophils Relative: 0 % (ref 0–1)
Eosinophils Absolute: 1.4 10*3/uL — ABNORMAL HIGH (ref 0.0–0.7)
Eosinophils Relative: 14 % — ABNORMAL HIGH (ref 0–5)
HCT: 29.1 % — ABNORMAL LOW (ref 39.0–52.0)
Hemoglobin: 9.5 g/dL — ABNORMAL LOW (ref 13.0–17.0)
LYMPHS ABS: 2.1 10*3/uL (ref 0.7–4.0)
LYMPHS PCT: 20 % (ref 12–46)
MCH: 31.5 pg (ref 26.0–34.0)
MCHC: 32.6 g/dL (ref 30.0–36.0)
MCV: 96.4 fL (ref 78.0–100.0)
MONO ABS: 1.4 10*3/uL — AB (ref 0.1–1.0)
Monocytes Relative: 13 % — ABNORMAL HIGH (ref 3–12)
NEUTROS ABS: 5.5 10*3/uL (ref 1.7–7.7)
Neutrophils Relative %: 53 % (ref 43–77)
Platelets: 323 10*3/uL (ref 150–400)
RBC: 3.02 MIL/uL — AB (ref 4.22–5.81)
RDW: 15 % (ref 11.5–15.5)
WBC: 10.4 10*3/uL (ref 4.0–10.5)

## 2014-01-16 LAB — COMPREHENSIVE METABOLIC PANEL
ALBUMIN: 2.6 g/dL — AB (ref 3.5–5.2)
ALT: <5 U/L (ref 0–53)
AST: 17 U/L (ref 0–37)
Alkaline Phosphatase: 56 U/L (ref 39–117)
BUN: 75 mg/dL — ABNORMAL HIGH (ref 6–23)
CALCIUM: 9.7 mg/dL (ref 8.4–10.5)
CO2: 24 mEq/L (ref 19–32)
Chloride: 93 mEq/L — ABNORMAL LOW (ref 96–112)
Creatinine, Ser: 12.94 mg/dL — ABNORMAL HIGH (ref 0.50–1.35)
GFR calc non Af Amer: 3 mL/min — ABNORMAL LOW (ref >90)
GFR, EST AFRICAN AMERICAN: 4 mL/min — AB (ref >90)
GLUCOSE: 76 mg/dL (ref 70–99)
Potassium: 5.3 mEq/L (ref 3.7–5.3)
Sodium: 138 mEq/L (ref 137–147)
TOTAL PROTEIN: 7.1 g/dL (ref 6.0–8.3)
Total Bilirubin: 0.2 mg/dL — ABNORMAL LOW (ref 0.3–1.2)

## 2014-01-16 LAB — HEMOGLOBIN A1C
HEMOGLOBIN A1C: 5.8 % — AB (ref <5.7)
MEAN PLASMA GLUCOSE: 120 mg/dL — AB (ref <117)

## 2014-01-16 LAB — LIPID PANEL
CHOLESTEROL: 123 mg/dL (ref 0–200)
HDL: 32 mg/dL — AB (ref >39)
LDL Cholesterol: 66 mg/dL (ref 0–99)
Total CHOL/HDL Ratio: 3.8 RATIO
Triglycerides: 125 mg/dL (ref <150)
VLDL: 25 mg/dL (ref 0–40)

## 2014-01-16 LAB — TSH: TSH: 1.81 u[IU]/mL (ref 0.350–4.500)

## 2014-01-16 LAB — GLUCOSE, CAPILLARY
GLUCOSE-CAPILLARY: 119 mg/dL — AB (ref 70–99)
Glucose-Capillary: 75 mg/dL (ref 70–99)
Glucose-Capillary: 130 mg/dL — ABNORMAL HIGH (ref 70–99)
Glucose-Capillary: 137 mg/dL — ABNORMAL HIGH (ref 70–99)

## 2014-01-16 MED ORDER — DARBEPOETIN ALFA-POLYSORBATE 40 MCG/0.4ML IJ SOLN
40.0000 ug | INTRAMUSCULAR | Status: DC
  Administered 2014-01-17 – 2014-01-24 (×2): 40 ug via INTRAVENOUS
  Filled 2014-01-16 (×2): qty 0.4

## 2014-01-16 MED ORDER — NA FERRIC GLUC CPLX IN SUCROSE 12.5 MG/ML IV SOLN
125.0000 mg | INTRAVENOUS | Status: DC
  Administered 2014-01-17 – 2014-01-24 (×4): 125 mg via INTRAVENOUS
  Filled 2014-01-16 (×8): qty 10

## 2014-01-16 MED ORDER — GABAPENTIN 300 MG PO CAPS
300.0000 mg | ORAL_CAPSULE | Freq: Every day | ORAL | Status: DC
  Administered 2014-01-16 – 2014-01-24 (×9): 300 mg via ORAL
  Filled 2014-01-16 (×10): qty 1

## 2014-01-16 NOTE — Procedures (Signed)
ELECTROENCEPHALOGRAM REPORT  Patient: [name] [name]       Room #: 3O67 EEG No. ID: [id number] Age: 73 y.o.        Sex: male Referring Physician: Regalado Report Date:  01/16/2014        Interpreting Physician: Wallie Char  History: [name] is an 74 y.o. male and in with end-stage renal disease, hypertension and diabetes mellitus who is admitted for evaluation and management of confusion.  Indications for study:  Assess severity of encephalopathy; rule out seizure activity.  Technique: This is an 18 channel routine scalp EEG performed at the bedside with bipolar and monopolar montages arranged in accordance to the international 10/20 system of electrode placement.   Description: This EEG recording was performed in wakefulness. Patient was noted to be somewhat confused and unsteady. Background activity consists of low amplitude diffuse symmetrical mixed delta and theta activity continuously. Photic stimulation was not performed. Hyperventilation was not performed. No epileptiform discharges recorded.  Interpretation: This EEG is abnormal with mild to moderate generalized continuous nonspecific slowing of cerebral activity. This pattern of slowing can be seen with degenerative as well as metabolic and toxic encephalopathies. No evidence of an epileptic disorder was demonstrated.   Rush Farmer M.D. [facility] [phone]

## 2014-01-16 NOTE — Consult Note (Signed)
Renal Service Consult Note [facility]  [name] [name] 01/16/2014 Sol Blazing Requesting Physician:  Dr Tyrell Antonio  Reason for Consult:  ESRD pt R leg pain and hip pain HPI: The patient is a 73 y.o. year-old with hx of ESRD, HTN, DM and resection of brain tumor w stable seizure d/o on meds. STarted HD around 2009 in [place] and moved to [place] last year. Has been in hospital 3 x in last 7 months with chest pain, then r/oTIA then with diarrhea and r/oTIA.  Presented yesterday with R leg and hip pain. Wife stated also pt has been confused.  UA was abnormal c/w UTI.   Pt denies any dysuria. Had enterococcus UTI last October rx'd with po amp.    No CP, abd pain, n/v/d, no CP or sob , no cough.  No focal weakenss. Walks with prosthetic leg on L  Chart review Sept 2014 >> presented with chest pain, hx DM2, PAD, PVD, HTN, HL, obesity, Jehovah's witness, ESRD due to DM/HTN on HD, hx brain tumor resected 2009, seizure d/o. Heart cath showed no sig CAD, EF normal  Oct 2014 >> presented with slurred speech, had stroke w/u which was negative. Had UTI w enterococus rx'd with po ampicillin. Neuro saw pt and continued Depakote for known seizure do and neurontin for phantom foot. Wife asked for ALF placement and he went to [facility].   April 3-6, 2014 >> presented from ALF w diarrhea, +norovirus going thru the ALF.  N/V also.  Missed HD x 2.  Creat up at 15 and K ok.  N/V/D resolved probably viral GE.  Had transient aphasia and MRI was neg for CVA.  Also ESRD on HD, PAF on asa and BB and DM2 diet controlled.  ROS  see aboe  Past Medical History  Past Medical History  Diagnosis Date  . Dialysis patient   . Peripheral vascular disease   . ESRD (end stage renal disease) on dialysis 09/2010    MWF  . Hypertension   . Hyperlipidemia   . Arthritis     Gout  . Paroxysmal atrial fibrillation   . Brain tumor 2009  . Seizure disorder 2009  . Anemia   . Hyperparathyroidism,  secondary   . Diabetes mellitus without complication     Type II, diet controlled  . Proteus mirabilis infection   . CHF (congestive heart failure)     grade 1 DD, preserved EF, [facility] records  . 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 brain tumor  . GERD (gastroesophageal reflux disease)   . Hx of cardiac cath     a. LHC (9/14):  Normal cors, EF 65%.    Past Surgical History  Past Surgical History  Procedure Laterality Date  . Av fistula placement Left     arm  . 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 Eye surgery Bilateral 2000    cataract  . Retinopathy surgery      Hx. of laser treatments for diabetics   Family History  Family History  Problem Relation Age of Onset  . Seizures Mother   . Stroke Mother 65  . Hypertension Mother   . Alcohol abuse Father   . Hypertension Father   . Arthritis Sister   . Heart attack Neg Hx   . Heart failure Neg Hx    Social History  reports that  he has never smoked. He has never used smokeless tobacco. He reports that he drinks about .6 ounces of alcohol per week. He reports that he does not use illicit drugs. Allergies No Known Allergies Home medications Prior to Admission medications   Medication Sig Start Date End Date Taking? Authorizing Provider  acetaminophen (TYLENOL) 325 MG tablet Take 325 mg by mouth every 6 (six) hours as needed for moderate pain or fever.   Yes Historical Provider, MD  aspirin EC 325 MG tablet Take 325 mg by mouth daily.   Yes Historical Provider, MD  atorvastatin (LIPITOR) 20 MG tablet Take 30 mg by mouth at bedtime.   Yes Historical Provider, MD  B Complex-C-Folic Acid (RENO CAPS) 1 MG CAPS Take 1 mg by mouth every evening. 8pm   Yes Historical Provider, MD  carbidopa-levodopa (SINEMET IR) 25-100 MG per tablet Take 0.5 tablets by mouth 3 (three) times daily.   Yes Historical Provider, MD  divalproex (DEPAKOTE)  500 MG DR tablet Take 1,500 mg by mouth daily.   Yes Historical Provider, MD  docusate sodium (COLACE) 100 MG capsule Take 100 mg by mouth at bedtime.    Yes Historical Provider, MD  escitalopram (LEXAPRO) 10 MG tablet Take 10 mg by mouth daily.   Yes Historical Provider, MD  gabapentin (NEURONTIN) 300 MG capsule Take 300 mg by mouth at bedtime.   Yes Historical Provider, MD  lanthanum (FOSRENOL) 1000 MG chewable tablet Chew 2,000 mg by mouth 3 (three) times daily with meals.   Yes Historical Provider, MD  metoprolol tartrate (LOPRESSOR) 25 MG tablet Take 6.25 mg by mouth 2 (two) times daily. Take 6.25mg  by mouth daily   Yes Historical Provider, MD  nitroGLYCERIN (NITROSTAT) 0.4 MG SL tablet Place 1 tablet (0.4 mg total) under the tongue every 5 (five) minutes x 3 doses as needed for chest pain. 05/28/13  Yes Nita Sells, MD  omeprazole (PRILOSEC OTC) 20 MG tablet Take 20 mg by mouth daily as needed (heartburn).    Yes Historical Provider, MD  oxyCODONE-acetaminophen (PERCOCET/ROXICET) 5-325 MG per tablet Take 1-2 tablets by mouth every 6 (six) hours as needed for severe pain. 01/13/14  Yes Jasper Riling. Alvino Chapel, MD  simethicone (MYLICON) 80 MG chewable tablet Chew 80 mg by mouth 2 (two) times daily as needed for flatulence.   Yes Historical Provider, MD   Liver Function Tests  Recent Labs Lab 01/15/14 1717 01/16/14 0405  AST 16 17  ALT <5 <5  ALKPHOS 56 56  BILITOT 0.3 0.2*  PROT 8.0 7.1  ALBUMIN 2.9* 2.6*   No results found for this basename: LIPASE, AMYLASE,  in the last 168 hours CBC  Recent Labs Lab 01/15/14 1358 01/16/14 0405  WBC 12.7* 10.4  NEUTROABS  --  5.5  HGB 10.5* 9.5*  HCT 31.6* 29.1*  MCV 96.6 96.4  PLT 353 073   Basic Metabolic Panel  Recent Labs Lab 01/15/14 1358 01/16/14 0405  NA 137 138  K 5.3 5.3  CL 91* 93*  CO2 23 24  GLUCOSE 119* 76  BUN 66* 75*  CREATININE 11.57* 12.94*  CALCIUM 10.0 9.7    Filed Vitals:   01/16/14 0001 01/16/14 0200  01/16/14 0353 01/16/14 0600  BP: 113/59 107/58 119/59 112/57  Pulse: 65 71 69 68  Temp:  97.6 F (36.4 C) 97.6 F (36.4 C) 97.9 F (36.6 C)  TempSrc:  Axillary Oral Oral  Resp: 18 16 18 18   Height:      Weight:  SpO2: 96% 100% 99% 100%   Exam: Up in chair, alert, a little slow to respond No rash, cyanosis or gangrene Sclera anicteric, throat clear No jvd Chest is clear bilat RRR no MRG Abd soft , NTND, no hsm or ascites 1/2" layer of purulent sediment in base of urinal along dark brown urine about 300 cc total L BKA w prosthesis, R ankle 1+ edema Neuro nonfocal, ox3 LUA AVF patent   Dialysis: TTS [place] 4.5h   117.5kg   2/2.0 [place]  Heparin 8000   LUA AVF EPO 5000     Venofer 100/hd thru 5/19  Assessment: 1 Altered mental status- w/u in progress, looks better, close to baseline today 2 UTI on Rocephin 3 ESRD on HD 4 Seizure d/o on Depakote 5 PVD L BKA 6 HTN on MTP only 7 Parkinson's (?) on Sinemet 8 Debility- lots of health problems and in ALF since Oct 2014 9 HPTH cont binder 10 Anemia Hb 9.5, epo + IV Fe   Plan- HD tomorrow, epo, IV Fe, consider urology eval for recurrent UTI   Kelly Splinter MD (pgr) [phone]    [phone] 01/16/2014, 1:57 PM

## 2014-01-16 NOTE — Progress Notes (Signed)
[facility] PROGRESS NOTE  [name] [name] MGQ:[id number] DOB: September 06, 1941 DOA: 01/15/2014 PCP: Donetta Potts, MD  Assessment/Plan: Acute encephalopathy -could be 2 to infection Vs opioids. Improving.  - EEG and MRI brain pending.  -Ammonia normal at 30.  Valproic acid at 54. -Hold percocet. Might be able to resume gabapentin.   Right lower extremity pain - differentials include osteoarthritis of the right hip but patient's pain radiates all the way to the ankle so MRI of the L-spine ordered.  Patient denies LE pain with ambulation. Doppler: There is no DVT or SVT noted in the right lower extremity.ABI; Calcified.   UTI - follow urine cultures. Continue with  ceftriaxone.  ESRD on hemodialysis - Dialysis: Tuesday Thursdays and Saturday. Renal consulted.  Seizure disorder - Continue with Depakote.  Chronic anemia - probably from ESRD. Follow CBC.   Code Status: Full Code.  Family Communication: Care discussed with Wife who was at bedside.  Disposition Plan: Remain inpatient.    Consultants:  Renal.   Procedures:  MRI lumbar spine.   Antibiotics: Ceftriaxone 5-4.   HPI/Subjective: Alert, awake, oriented to situation and place. Confusion improved per wife.  He has been having right leg pain. No LE pain on ambulation.   Objective: Filed Vitals:   01/16/14 0600  BP: 112/57  Pulse: 68  Temp: 97.9 F (36.6 C)  Resp: 18    Intake/Output Summary (Last 24 hours) at 01/16/14 0946 Last data filed at 01/16/14 0400  Gross per 24 hour  Intake     60 ml  Output    200 ml  Net   -140 ml   Filed Weights   01/15/14 2137  Weight: 120.385 kg (265 lb 6.4 oz)    Exam:   General:  Alert, in no distress.   Cardiovascular: S 1, S 2 RRR  Respiratory: distant lungs sound.   Abdomen: Bs present, soft, NT  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.   Data Reviewed: Basic Metabolic Panel:  Recent Labs Lab 01/15/14 1358 01/16/14 0405  NA 137 138  K 5.3 5.3  CL 91* 93*   CO2 23 24  GLUCOSE 119* 76  BUN 66* 75*  CREATININE 11.57* 12.94*  CALCIUM 10.0 9.7   Liver Function Tests:  Recent Labs Lab 01/15/14 1717 01/16/14 0405  AST 16 17  ALT <5 <5  ALKPHOS 56 56  BILITOT 0.3 0.2*  PROT 8.0 7.1  ALBUMIN 2.9* 2.6*   No results found for this basename: LIPASE, AMYLASE,  in the last 168 hours  Recent Labs Lab 01/15/14 1717  AMMONIA 30   CBC:  Recent Labs Lab 01/15/14 1358 01/16/14 0405  WBC 12.7* 10.4  NEUTROABS  --  5.5  HGB 10.5* 9.5*  HCT 31.6* 29.1*  MCV 96.6 96.4  PLT 353 323   Cardiac Enzymes: No results found for this basename: CKTOTAL, CKMB, CKMBINDEX, TROPONINI,  in the last 168 hours BNP (last 3 results)  Recent Labs  05/26/13 1700 01/15/14 1316  PROBNP 699.3* 1399.0*   CBG:  Recent Labs Lab 01/15/14 2135 01/16/14 0742  GLUCAP 132* 75    No results found for this or any previous visit (from the past 240 hour(s)).   Studies: Dg Chest 2 View  01/15/2014   CLINICAL DATA:  Leg swelling.  Lethargy.  EXAM: CHEST  2 VIEW  COMPARISON:  09/30/2013.  FINDINGS: Poor inspiration. Mild bibasilar atelectasis. Grossly normal sized heart. Thoracic spine degenerative changes.  IMPRESSION: Poor inspiration with mild bibasilar atelectasis.   Electronically Signed  By: Enrique Sack M.D.   On: 01/15/2014 14:36   Ct Head Wo Contrast  01/15/2014   CLINICAL DATA:  Head pain  EXAM: CT HEAD WITHOUT CONTRAST  TECHNIQUE: Contiguous axial images were obtained from the base of the skull through the vertex without intravenous contrast.  COMPARISON:  Brain MRI, 12/18/2013 Hand head CT, 09/08/13.  FINDINGS: Ventricles are normal in configuration. There is ventricular and sulcal enlargement reflecting mild to moderate atrophy  No parenchymal masses or mass effect. There is no evidence of a recent infarct. Encephalomalacia along the right temporal lobe is stable due to a previous right frontotemporal craniotomy.  No extra-axial masses or abnormal fluid  collections.  There is no intracranial hemorrhage right craniotomy changes are stable. Minimal maxillary sinus mucosal thickening. Remaining sinuses and mastoid air cells are clear.  IMPRESSION: 1. No acute intracranial abnormalities. No change from the prior study.   Electronically Signed   By: Lajean Manes M.D.   On: 01/15/2014 19:10    Scheduled Meds: . aspirin EC  325 mg Oral Daily  . atorvastatin  30 mg Oral QHS  . folic acid  1 mg Oral Daily   And  . B-complex with vitamin C  1 tablet Oral Daily  . carbidopa-levodopa  0.5 tablet Oral TID  . cefTRIAXone (ROCEPHIN)  IV  1 g Intravenous Q24H  . divalproex  1,500 mg Oral Daily  . docusate sodium  100 mg Oral QHS  . escitalopram  10 mg Oral Daily  . insulin aspart  0-9 Units Subcutaneous TID WC  . lanthanum  2,000 mg Oral TID WC  . metoprolol tartrate  6.25 mg Oral BID   Continuous Infusions:   Principal Problem:   Acute encephalopathy Active Problems:   ESRD (end stage renal disease) on dialysis   Paroxysmal atrial fibrillation   UTI (lower urinary tract infection)   Lower extremity pain, right    Time spent: 35 minutes.     [facility] Pager [phone]. If 7PM-7AM, please contact night-coverage at [email or web address], password [facility] 01/16/2014, 9:46 AM  LOS: 1 day

## 2014-01-16 NOTE — Evaluation (Signed)
Physical Therapy Evaluation Patient Details Name: [name] [name] MRN: [id number] DOB: [birth year] Today's Date: 01/16/2014   History of Present Illness  73 year old male with a history of ESRD, PVD, L BKA with prosthesis, HTN, Sz d/o, DM, PAF, resected oligoastrocytoma presents with RLE pain and AMS. Pt with UTI and encephalopathy with CT(-)  Clinical Impression  Pt demonstrates decreased cognition, mobility and balance with difficulty problem solving and even after discussion regarding safety concerns with pt reported bathing sequence and stating it was unsafe pt still unable to state need for additional assist at ALF. Per wife report she does not feel pt is functioning at the high level he is reporting and based on mobility, decreased command following and cognition feel pt needs assist for ADLs and mobility which would either required increased level of assist at ALF along with HHPT or SNF pending on what ALF can provide. Pt will benefit from acute therapy to address above and below deficits to maximize mobility, function and safety to decrease fall risk and burden of care.     Follow Up Recommendations SNF;Supervision for mobility/OOB,   Equipment Recommendations  3in1 (PT)    Recommendations for Other Services OT consult     Precautions / Restrictions Precautions Precautions: Fall Precaution Comments: L BKA with prosthesis      Mobility  Bed Mobility Overal bed mobility: Needs Assistance Bed Mobility: Supine to Sit     Supine to sit: Supervision     General bed mobility comments: cues for seqeunce with use of rail and increased time  Transfers Overall transfer level: Needs assistance Equipment used: Rolling walker (2 wheeled) Transfers: Sit to/from Stand Sit to Stand: Min guard;Min assist         General transfer comment: to stand from bed pt minguard with cues for hand placement with descent to chair decreased control despite cues and min assist to control rapid  descent  Ambulation/Gait Ambulation/Gait assistance: Supervision Ambulation Distance (Feet): 150 Feet Assistive device: Rolling walker (2 wheeled) Gait Pattern/deviations: Step-through pattern;Decreased stride length;Trunk flexed   Gait velocity interpretation: Below normal speed for age/gender General Gait Details: pt with flexed posture, self posterior to RW and hitting Right foot on RW at least 4x during gait with cues throughout to correct posture and position and for directional cues  Stairs            Wheelchair Mobility    Modified Rankin (Stroke Patients Only)       Balance Overall balance assessment: Needs assistance;History of Falls   Sitting balance-Leahy Scale: Fair       Standing balance-Leahy Scale: Poor                               Pertinent Vitals/Pain No pain    Home Living Family/patient expects to be discharged to:: Assisted living               Home Equipment: Kasandra Knudsen - single point;Tub bench;Walker - 4 wheels;Grab bars - tub/shower      Prior Function Level of Independence: Independent with assistive device(s)         Comments: pt states he is walking with his rollator without assist, bending over to clean tub bench before use then sitting to bath and using grab bars for transfer into and out of tub. Wife present and states she doesn't think pt is actually achieving bathing as he says he is     Hand Dominance  Extremity/Trunk Assessment   Upper Extremity Assessment: Overall WFL for tasks assessed           Lower Extremity Assessment: Generalized weakness;LLE deficits/detail   LLE Deficits / Details: pt with prosthesis on throughout  Cervical / Trunk Assessment: Normal  Communication   Communication: No difficulties  Cognition Arousal/Alertness: Awake/alert Behavior During Therapy: Flat affect Overall Cognitive Status: Impaired/Different from baseline Area of Impairment: Attention;Memory;Following  commands;Safety/judgement;Problem solving   Current Attention Level: Sustained Memory: Decreased short-term memory Following Commands: Follows one step commands with increased time Safety/Judgement: Decreased awareness of deficits   Problem Solving: Slow processing;Difficulty sequencing;Requires verbal cues;Requires tactile cues General Comments: Pt with decreased problem solving, awareness and frequently needed questions repeated to answer and provide rather tangential answers. Spouse reports she has noticed continued decline in awareness and memory recently    General Comments      Exercises        Assessment/Plan    PT Assessment Patient needs continued PT services  PT Diagnosis Difficulty walking;Altered mental status;Generalized weakness   PT Problem List Decreased strength;Decreased cognition;Decreased activity tolerance;Decreased balance;Decreased safety awareness;Decreased knowledge of use of DME;Decreased mobility;Obesity  PT Treatment Interventions Gait training;Functional mobility training;Therapeutic activities;Therapeutic exercise;Patient/family education;DME instruction;Balance training;Cognitive remediation   PT Goals (Current goals can be found in the Care Plan section) Acute Rehab PT Goals Patient Stated Goal: to return to ALF PT Goal Formulation: With patient/family Time For Goal Achievement: 01/30/14 Potential to Achieve Goals: Fair    Frequency Min 3X/week   Barriers to discharge Decreased caregiver support      Co-evaluation               End of Session Equipment Utilized During Treatment: Gait belt;Other (comment) (LLE prosthesis) Activity Tolerance: Patient tolerated treatment well Patient left: in chair;with chair alarm set;with call bell/phone within reach;with family/visitor present Nurse Communication: Mobility status;Precautions         Time: 1884-1660 PT Time Calculation (min): 33 min   Charges:   PT Evaluation $Initial PT  Evaluation Tier I: 1 Procedure PT Treatments $Gait Training: 8-22 mins $Therapeutic Activity: 8-22 mins   PT G Codes:          Shaquoia Miers B Verdun Rackley 01/16/2014, 1:22 PM Elwyn Reach, [place]

## 2014-01-16 NOTE — Progress Notes (Signed)
Routine adult EEG completed. Results pending. 

## 2014-01-16 NOTE — Care Management Note (Signed)
    Page 1 of 1   01/23/2014     2:11:52 PM CARE MANAGEMENT NOTE 01/23/2014  Patient:  [name] [name],[name] [name]   Account Number:  [id number]  Date Initiated:  01/16/2014  Documentation initiated by:  GRAVES-BIGELOW,Elasia Furnish  Subjective/Objective Assessment:   Pt admitted for AMS, ESRD. ruled out for DVT.     Action/Plan:   CM will continue to monitor for disposition needs.   Anticipated DC Date:  01/18/2014   Anticipated DC Plan:  SKILLED NURSING FACILITY         Choice offered to / List presented to:             Status of service:  Completed, signed off Medicare Important Message given?   (If response is "NO", the following Medicare IM given date fields will be blank) Date Medicare IM given:   Date Additional Medicare IM given:  01/20/2014  Discharge Disposition:  [place]  Per UR Regulation:  Reviewed for med. necessity/level of care/duration of stay  If discussed at [place] of Stay Meetings, dates discussed:   01/24/2014    Comments:

## 2014-01-16 NOTE — Consult Note (Signed)
Consult: Urinary tract infection Requested by: Dr. Tyrell Antonio   History of Present Illness: 73 year old admitted with R leg and hip pain. Wife stated also pt has been confused. His U/A and micro appear c/w UTI. Cx pending. He has end-stage renal disease and is on hemodialysis. He still makes urine and voids 1-2 times per day. I actually saw the patient in the office for erectile dysfunction Feb 2014 at which time he had a normal DRE. He had moderate bacteria on urinalysis but no signs of infection at that time. Pt has been in hospital 3 x in last 7 months with chest pain, then r/oTIA then with diarrhea and r/oTIA. He typically voids with a weak stream and again only about once per day.   Recent UA and cultures: -February 2014 UA moderate bacteria, asymptomatic -June 2014 UA consistent with infection, culture Klebsiella -October 2014 UA consistent with infection, culture enterococcus -May 2015 UA consistent with infection, culture pending  PMHX significant for HTN, DM and resection of brain tumor w stable seizure d/o on meds.   Today, he underwent a renal and bladder ultrasound. This showed mildly atrophic kidneys without hydronephrosis stone or mass. The bladder contained about 300 mL of urine by my calculation. I independently reviewed all the images.    Past Medical History  Diagnosis Date  . ESRD on hemodialysis     Started HD around 2009, maybe longer.  Was living in [place], [place] then.  Moved to [place] in 2014 and gets HD now at [facility] on [address] on a TTS schedule.  ESRD was due to DM and HTN.     Marland Kitchen Peripheral vascular disease   . Hypertension   . Hyperlipidemia   . Arthritis     Gout  . Paroxysmal atrial fibrillation   . Brain tumor 2009  . Seizure disorder 2009  . Anemia   . Hyperparathyroidism, secondary   . Diabetes mellitus without complication     Type II, diet controlled  . Proteus mirabilis infection   . CHF (congestive heart failure)     grade 1 DD,  preserved EF, [facility] records  . 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 brain tumor  . GERD (gastroesophageal reflux disease)   . Hx of cardiac cath     a. LHC (9/14):  Normal cors, EF 65%.    Past Surgical History  Procedure Laterality Date  . Av fistula placement Left     arm  . 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 Eye surgery Bilateral 2000    cataract  . Retinopathy surgery      Hx. of laser treatments for diabetics    Home Medications:  Prescriptions prior to admission  Medication Sig Dispense Refill  . acetaminophen (TYLENOL) 325 MG tablet Take 325 mg by mouth every 6 (six) hours as needed for moderate pain or fever.      Marland Kitchen aspirin EC 325 MG tablet Take 325 mg by mouth daily.      Marland Kitchen atorvastatin (LIPITOR) 20 MG tablet Take 30 mg by mouth at bedtime.      . B Complex-C-Folic Acid (RENO CAPS) 1 MG CAPS Take 1 mg by mouth every evening. 8pm      . carbidopa-levodopa (SINEMET IR) 25-100 MG per tablet Take 0.5 tablets by mouth 3 (three) times daily.      . divalproex (DEPAKOTE) 500 MG  DR tablet Take 1,500 mg by mouth daily.      Marland Kitchen docusate sodium (COLACE) 100 MG capsule Take 100 mg by mouth at bedtime.       Marland Kitchen escitalopram (LEXAPRO) 10 MG tablet Take 10 mg by mouth daily.      Marland Kitchen gabapentin (NEURONTIN) 300 MG capsule Take 300 mg by mouth at bedtime.      Marland Kitchen lanthanum (FOSRENOL) 1000 MG chewable tablet Chew 2,000 mg by mouth 3 (three) times daily with meals.      . metoprolol tartrate (LOPRESSOR) 25 MG tablet Take 6.25 mg by mouth 2 (two) times daily. Take 6.25mg  by mouth daily      . nitroGLYCERIN (NITROSTAT) 0.4 MG SL tablet Place 1 tablet (0.4 mg total) under the tongue every 5 (five) minutes x 3 doses as needed for chest pain.  30 tablet  12  . omeprazole (PRILOSEC OTC) 20 MG tablet Take 20 mg by mouth daily as needed (heartburn).       Marland Kitchen oxyCODONE-acetaminophen  (PERCOCET/ROXICET) 5-325 MG per tablet Take 1-2 tablets by mouth every 6 (six) hours as needed for severe pain.  10 tablet  0  . simethicone (MYLICON) 80 MG chewable tablet Chew 80 mg by mouth 2 (two) times daily as needed for flatulence.       Allergies: No Known Allergies  Family History  Problem Relation Age of Onset  . Seizures Mother   . Stroke Mother 68  . Hypertension Mother   . Alcohol abuse Father   . Hypertension Father   . Arthritis Sister   . Heart attack Neg Hx   . Heart failure Neg Hx    Social History:  reports that he has never smoked. He has never used smokeless tobacco. He reports that he drinks about .6 ounces of alcohol per week. He reports that he does not use illicit drugs.  ROS: A complete review of systems was performed.  All systems are negative except for pertinent findings as noted. Review of Systems  All other systems reviewed and are negative.    Physical Exam:  Vital signs in last 24 hours: Temp:  [97.6 F (36.4 C)-97.9 F (36.6 C)] 97.9 F (36.6 C) (05/04 0600) Pulse Rate:  [65-100] 100 (05/04 1648) Resp:  [16-18] 18 (05/04 0600) BP: (107-166)/(57-81) 166/81 mmHg (05/04 1648) SpO2:  [96 %-100 %] 97 % (05/04 1648) Weight:  [120.385 kg (265 lb 6.4 oz)] 120.385 kg (265 lb 6.4 oz) (05/03 2137) General:  Alert and oriented, No acute distress HEENT: Normocephalic, atraumatic Neck: No JVD or lymphadenopathy Cardiovascular: Regular rate and rhythm Lungs: Regular rate and effort Abdomen: Soft, nontender, nondistended, no abdominal masses Back: No CVA tenderness Extremities: No edema Neurologic: Grossly intact  Laboratory Data:  Results for orders placed during the hospital encounter of 01/15/14 (from the past 24 hour(s))  I-STAT ARTERIAL BLOOD GAS, ED     Status: Abnormal   Collection Time    01/15/14  8:06 PM      Result Value Ref Range   pH, Arterial 7.391  7.350 - 7.450   pCO2 arterial 41.2  35.0 - 45.0 mmHg   pO2, Arterial 66.0 (*) 80.0  - 100.0 mmHg   Bicarbonate 25.0 (*) 20.0 - 24.0 mEq/L   TCO2 26  0 - 100 mmol/L   O2 Saturation 93.0     Patient temperature 98.6 F     Collection site RADIAL, ALLEN'S TEST ACCEPTABLE     Drawn by Mining engineer  Sample type ARTERIAL    GLUCOSE, CAPILLARY     Status: Abnormal   Collection Time    01/15/14  9:35 PM      Result Value Ref Range   Glucose-Capillary 132 (*) 70 - 99 mg/dL  VALPROIC ACID LEVEL     Status: None   Collection Time    01/15/14 10:40 PM      Result Value Ref Range   Valproic Acid Lvl 54.9  50.0 - 100.0 ug/mL  COMPREHENSIVE METABOLIC PANEL     Status: Abnormal   Collection Time    01/16/14  4:05 AM      Result Value Ref Range   Sodium 138  137 - 147 mEq/L   Potassium 5.3  3.7 - 5.3 mEq/L   Chloride 93 (*) 96 - 112 mEq/L   CO2 24  19 - 32 mEq/L   Glucose, Bld 76  70 - 99 mg/dL   BUN 75 (*) 6 - 23 mg/dL   Creatinine, Ser 12.94 (*) 0.50 - 1.35 mg/dL   Calcium 9.7  8.4 - 10.5 mg/dL   Total Protein 7.1  6.0 - 8.3 g/dL   Albumin 2.6 (*) 3.5 - 5.2 g/dL   AST 17  0 - 37 U/L   ALT <5  0 - 53 U/L   Alkaline Phosphatase 56  39 - 117 U/L   Total Bilirubin 0.2 (*) 0.3 - 1.2 mg/dL   GFR calc non Af Amer 3 (*) >90 mL/min   GFR calc Af Amer 4 (*) >90 mL/min  CBC WITH DIFFERENTIAL     Status: Abnormal   Collection Time    01/16/14  4:05 AM      Result Value Ref Range   WBC 10.4  4.0 - 10.5 K/uL   RBC 3.02 (*) 4.22 - 5.81 MIL/uL   Hemoglobin 9.5 (*) 13.0 - 17.0 g/dL   HCT 29.1 (*) 39.0 - 52.0 %   MCV 96.4  78.0 - 100.0 fL   MCH 31.5  26.0 - 34.0 pg   MCHC 32.6  30.0 - 36.0 g/dL   RDW 15.0  11.5 - 15.5 %   Platelets 323  150 - 400 K/uL   Neutrophils Relative % 53  43 - 77 %   Neutro Abs 5.5  1.7 - 7.7 K/uL   Lymphocytes Relative 20  12 - 46 %   Lymphs Abs 2.1  0.7 - 4.0 K/uL   Monocytes Relative 13 (*) 3 - 12 %   Monocytes Absolute 1.4 (*) 0.1 - 1.0 K/uL   Eosinophils Relative 14 (*) 0 - 5 %   Eosinophils Absolute 1.4 (*) 0.0 - 0.7 K/uL   Basophils Relative 0   0 - 1 %   Basophils Absolute 0.0  0.0 - 0.1 K/uL  TSH     Status: None   Collection Time    01/16/14  4:05 AM      Result Value Ref Range   TSH 1.810  0.350 - 4.500 uIU/mL  HEMOGLOBIN A1C     Status: Abnormal   Collection Time    01/16/14  4:05 AM      Result Value Ref Range   Hemoglobin A1C 5.8 (*) <5.7 %   Mean Plasma Glucose 120 (*) <117 mg/dL  LIPID PANEL     Status: Abnormal   Collection Time    01/16/14  4:05 AM      Result Value Ref Range   Cholesterol 123  0 - 200 mg/dL  Triglycerides 125  <150 mg/dL   HDL 32 (*) >39 mg/dL   Total CHOL/HDL Ratio 3.8     VLDL 25  0 - 40 mg/dL   LDL Cholesterol 66  0 - 99 mg/dL  GLUCOSE, CAPILLARY     Status: None   Collection Time    01/16/14  7:42 AM      Result Value Ref Range   Glucose-Capillary 75  70 - 99 mg/dL  GLUCOSE, CAPILLARY     Status: Abnormal   Collection Time    01/16/14 11:52 AM      Result Value Ref Range   Glucose-Capillary 119 (*) 70 - 99 mg/dL  GLUCOSE, CAPILLARY     Status: Abnormal   Collection Time    01/16/14  5:30 PM      Result Value Ref Range   Glucose-Capillary 130 (*) 70 - 99 mg/dL   No results found for this or any previous visit (from the past 240 hour(s)). Creatinine:  Recent Labs  01/15/14 1358 01/16/14 0405  CREATININE 11.57* 12.94*    Impression/Assessment/Plan: -Recurrent urinary tract infection-this is new for the patient. When I saw him February 2014 he was asymptomatic although his urine contained moderate bacteria. It's important to differentiate asymptomatic bacteriuria from true UTI per ID guidelines. It's conceivable he was exposed to antibiotics over the past year which eliminated some of his normal GI and GU flora and paradoxically lead to true urinary tract infection. It would be important not to treat with antibiotics unless he had symptoms such as fever, bladder pain, new urinary symptoms, hematuria etc. It would also be appropriate to treat for UTI with other symptoms such as  lethargy or acute mental status changes if no other source is identified. There no concerning findings on the renal ultrasound apart from the fact that his bladder contained a fair amount of urine. It's conceivable he is not emptying his bladder completely. He doesn't require any immediate intervention, therefore I'll see him back in the office to check his post void residuals as well as perform cystoscopy. We discussed the nature risks and benefits of cystoscopy.  -weak stream  - it's possible he's not emptying his bladder. Again, we'll evaluate with PVR and cystoscopy. If he can tolerate it, he could begin tamsulosin 0.4 mg po qhs.   Marja Kays Treysen Sudbeck 01/16/2014, 7:58 PM

## 2014-01-16 NOTE — Progress Notes (Signed)
Called by EEG lab to check patient with c/o chest pain.  On arrival pain has resolved.  Patient alert - warm and dry - oriented - states he had left side chest pain that radiated to back.  He describes this as an ongoing chronic issue that has been worked up several times without evidence of ACS.  Chart review shows cath in Sept 2014 with clean coronaries and EF 65%.  He states the pain has been dx as chest wall pain.  He states today it started when the EEG tech started marking his head for lead placement.  It resolved as quickly as it started.  No SOB, no nausea, no diaphoresis - and he describes it as his "normal" chest pain.  EEG tech denies seeing any distress with patient.  Heart sounds with some irregularity - reviewed EKG and strips in EPIC - does have sinus arrythmia - PAC's noted.  Patient is off telemetry.  Spoke with patients' RN Maudry Mayhew on 3W.  No interventions needed.  Maudry Mayhew to follow up as needed.

## 2014-01-17 DIAGNOSIS — E119 Type 2 diabetes mellitus without complications: Secondary | ICD-10-CM

## 2014-01-17 LAB — BASIC METABOLIC PANEL
BUN: 95 mg/dL — AB (ref 6–23)
CALCIUM: 9.7 mg/dL (ref 8.4–10.5)
CO2: 22 mEq/L (ref 19–32)
Chloride: 91 mEq/L — ABNORMAL LOW (ref 96–112)
Creatinine, Ser: 14.29 mg/dL — ABNORMAL HIGH (ref 0.50–1.35)
GFR, EST AFRICAN AMERICAN: 3 mL/min — AB (ref >90)
GFR, EST NON AFRICAN AMERICAN: 3 mL/min — AB (ref >90)
GLUCOSE: 143 mg/dL — AB (ref 70–99)
Potassium: 5.5 mEq/L — ABNORMAL HIGH (ref 3.7–5.3)
Sodium: 136 mEq/L — ABNORMAL LOW (ref 137–147)

## 2014-01-17 LAB — URINE CULTURE: Colony Count: >=100000

## 2014-01-17 LAB — CBC
HCT: 30 % — ABNORMAL LOW (ref 39.0–52.0)
HEMOGLOBIN: 10.1 g/dL — AB (ref 13.0–17.0)
MCH: 31.8 pg (ref 26.0–34.0)
MCHC: 33.7 g/dL (ref 30.0–36.0)
MCV: 94.3 fL (ref 78.0–100.0)
Platelets: 318 10*3/uL (ref 150–400)
RBC: 3.18 MIL/uL — ABNORMAL LOW (ref 4.22–5.81)
RDW: 14.8 % (ref 11.5–15.5)
WBC: 10.9 10*3/uL — ABNORMAL HIGH (ref 4.0–10.5)

## 2014-01-17 LAB — GLUCOSE, CAPILLARY
GLUCOSE-CAPILLARY: 167 mg/dL — AB (ref 70–99)
Glucose-Capillary: 129 mg/dL — ABNORMAL HIGH (ref 70–99)
Glucose-Capillary: 132 mg/dL — ABNORMAL HIGH (ref 70–99)

## 2014-01-17 MED ORDER — DARBEPOETIN ALFA-POLYSORBATE 40 MCG/0.4ML IJ SOLN
INTRAMUSCULAR | Status: AC
  Administered 2014-01-17: 40 ug via INTRAVENOUS
  Filled 2014-01-17: qty 0.4

## 2014-01-17 MED ORDER — HEPARIN SODIUM (PORCINE) 1000 UNIT/ML DIALYSIS: 4000.0000 [IU] | INTRAMUSCULAR | Status: DC | PRN

## 2014-01-17 MED ORDER — HEPARIN SODIUM (PORCINE) 1000 UNIT/ML DIALYSIS: 1000.0000 [IU] | INTRAMUSCULAR | Status: DC | PRN

## 2014-01-17 MED ORDER — PENTAFLUOROPROP-TETRAFLUOROETH EX AERO: 1.0000 "application " | INHALATION_SPRAY | CUTANEOUS | Status: DC | PRN

## 2014-01-17 MED ORDER — SODIUM CHLORIDE 0.9 % IV SOLN: 100.0000 mL | INTRAVENOUS | Status: DC | PRN

## 2014-01-17 MED ORDER — SODIUM CHLORIDE 0.9 % IV SOLN
100.0000 mL | INTRAVENOUS | Status: DC | PRN
Start: 2014-01-17 — End: 2014-01-19

## 2014-01-17 MED ORDER — ONDANSETRON HCL 4 MG/2ML IJ SOLN
4.0000 mg | Freq: Four times a day (QID) | INTRAMUSCULAR | Status: DC | PRN
  Administered 2014-01-18: 4 mg via INTRAVENOUS
  Filled 2014-01-17: qty 2

## 2014-01-17 MED ORDER — NEPRO/CARBSTEADY PO LIQD: 237.0000 mL | ORAL | Status: DC | PRN

## 2014-01-17 MED ORDER — ALTEPLASE 2 MG IJ SOLR
2.0000 mg | Freq: Once | INTRAMUSCULAR | Status: AC | PRN
  Filled 2014-01-17: qty 2

## 2014-01-17 MED ORDER — CARBIDOPA-LEVODOPA 25-100 MG PO TABS
0.5000 | ORAL_TABLET | Freq: Once | ORAL | Status: AC
  Administered 2014-01-17: 0.5 via ORAL
  Filled 2014-01-17: qty 0.5

## 2014-01-17 MED ORDER — LIDOCAINE-PRILOCAINE 2.5-2.5 % EX CREA: 1.0000 "application " | TOPICAL_CREAM | CUTANEOUS | Status: DC | PRN

## 2014-01-17 MED ORDER — LIDOCAINE HCL (PF) 1 % IJ SOLN
5.0000 mL | INTRAMUSCULAR | Status: DC | PRN
  Filled 2014-01-17: qty 5

## 2014-01-17 NOTE — Consult Note (Signed)
Reason for Consult: Lumbar spinal stenosis Referring Physician: Dr. Regalado  [name] [name] is an 72 y.o. male.  HPI: Patient is a 72-year-old individual who's had difficulty with pain and weakness in his lower extremities. 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 There is however on review the MRI scan so a firmness canal. He has a left BKA and has distal lower extremity pain.  Past Medical History  Diagnosis Date  . ESRD on hemodialysis     Started HD around 2009, maybe longer.  Was living in [place], [place] then.  Moved to [place] in 2014 and gets HD now at [facility] on [address] on a TTS schedule.  ESRD was due to DM and HTN.     . Peripheral vascular disease   . Hypertension   . Hyperlipidemia   . Arthritis     Gout  . Paroxysmal atrial fibrillation   . Brain tumor 2009  . Seizure disorder 2009  . Anemia   . Hyperparathyroidism, secondary   . Diabetes mellitus without complication     Type II, diet controlled  . Proteus mirabilis infection   . CHF (congestive heart failure)     grade 1 DD, preserved EF, [facility] records  . 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 brain tumor  . GERD (gastroesophageal reflux disease)   . Hx of cardiac cath     a. LHC (9/14):  Normal cors, EF 65%.     Past Surgical History  Procedure Laterality Date  . Av fistula placement Left     arm  . 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 Eye surgery Bilateral 2000    cataract  . Retinopathy surgery      Hx. of laser treatments for diabetics    Family History  Problem Relation Age of Onset  . Seizures Mother   . Stroke Mother 65  . Hypertension Mother   . Alcohol abuse Father   . Hypertension Father   . Arthritis Sister   . Heart attack Neg Hx   . Heart failure Neg Hx     Social  History:  reports that he has never smoked. He has never used smokeless tobacco. He reports that he drinks about .6 ounces of alcohol per week. He reports that he does not use illicit drugs.  Allergies: No Known Allergies  Medications: I have reviewed the patient's current medications.  Results for orders placed during the hospital encounter of 01/15/14 (from the past 48 hour(s))  COMPREHENSIVE METABOLIC PANEL     Status: Abnormal   Collection Time    01/16/14  4:05 AM      Result Value Ref Range   Sodium 138  137 - 147 mEq/L   Potassium 5.3  3.7 - 5.3 mEq/L   Chloride 93 (*) 96 - 112 mEq/L   CO2 24  19 - 32 mEq/L   Glucose, Bld 76  70 - 99 mg/dL   BUN 75 (*) 6 - 23 mg/dL   Creatinine, Ser 12.94 (*) 0.50 - 1.35 mg/dL   Calcium 9.7  8.4 - 10.5 mg/dL   Total Protein 7.1  6.0 - 8.3 g/dL   Albumin 2.6 (*) 3.5 - 5.2 g/dL   AST 17  0 - 37 U/L   ALT <5    0 - 53 U/L   Alkaline Phosphatase 56  39 - 117 U/L   Total Bilirubin 0.2 (*) 0.3 - 1.2 mg/dL   GFR calc non Af Amer 3 (*) >90 mL/min   GFR calc Af Amer 4 (*) >90 mL/min   Comment: (NOTE)     The eGFR has been calculated using the CKD EPI equation.     This calculation has not been validated in all clinical situations.     eGFR's persistently <90 mL/min signify possible Chronic Kidney     Disease.  CBC WITH DIFFERENTIAL     Status: Abnormal   Collection Time    01/16/14  4:05 AM      Result Value Ref Range   WBC 10.4  4.0 - 10.5 K/uL   RBC 3.02 (*) 4.22 - 5.81 MIL/uL   Hemoglobin 9.5 (*) 13.0 - 17.0 g/dL   HCT 29.1 (*) 39.0 - 52.0 %   MCV 96.4  78.0 - 100.0 fL   MCH 31.5  26.0 - 34.0 pg   MCHC 32.6  30.0 - 36.0 g/dL   RDW 15.0  11.5 - 15.5 %   Platelets 323  150 - 400 K/uL   Neutrophils Relative % 53  43 - 77 %   Neutro Abs 5.5  1.7 - 7.7 K/uL   Lymphocytes Relative 20  12 - 46 %   Lymphs Abs 2.1  0.7 - 4.0 K/uL   Monocytes Relative 13 (*) 3 - 12 %   Monocytes Absolute 1.4 (*) 0.1 - 1.0 K/uL   Eosinophils Relative 14 (*) 0 -  5 %   Eosinophils Absolute 1.4 (*) 0.0 - 0.7 K/uL   Basophils Relative 0  0 - 1 %   Basophils Absolute 0.0  0.0 - 0.1 K/uL  TSH     Status: None   Collection Time    01/16/14  4:05 AM      Result Value Ref Range   TSH 1.810  0.350 - 4.500 uIU/mL   Comment: Please note change in reference range.  HEMOGLOBIN A1C     Status: Abnormal   Collection Time    01/16/14  4:05 AM      Result Value Ref Range   Hemoglobin A1C 5.8 (*) <5.7 %   Comment: (NOTE)                                                                               According to the ADA Clinical Practice Recommendations for 2011, when     HbA1c is used as a screening test:      >=6.5%   Diagnostic of Diabetes Mellitus               (if abnormal result is confirmed)     5.7-6.4%   Increased risk of developing Diabetes Mellitus     References:Diagnosis and Classification of Diabetes Mellitus,Diabetes     HCWC,3762,83(TDVVO 1):S62-S69 and Standards of Medical Care in             Diabetes - 2011,Diabetes Care,2011,34 (Suppl 1):S11-S61.   Mean Plasma Glucose 120 (*) <117 mg/dL   Comment: Performed at [facility]  Status: Abnormal   Collection Time    01/16/14  4:05 AM      Result Value Ref Range   Cholesterol 123  0 - 200 mg/dL   Triglycerides 125  <150 mg/dL   HDL 32 (*) >39 mg/dL   Total CHOL/HDL Ratio 3.8     VLDL 25  0 - 40 mg/dL   LDL Cholesterol 66  0 - 99 mg/dL   Comment:            Total Cholesterol/HDL:CHD Risk     Coronary Heart Disease Risk Table                         Men   Women      1/2 Average Risk   3.4   3.3      Average Risk       5.0   4.4      2 X Average Risk   9.6   7.1      3 X Average Risk  23.4   11.0                Use the calculated Patient Ratio     above and the CHD Risk Table     to determine the patient's CHD Risk.                ATP III CLASSIFICATION (LDL):      <100     mg/dL   Optimal      100-129  mg/dL   Near or Above                        Optimal       130-159  mg/dL   Borderline      160-189  mg/dL   High      >190     mg/dL   Very High  GLUCOSE, CAPILLARY     Status: None   Collection Time    01/16/14  7:42 AM      Result Value Ref Range   Glucose-Capillary 75  70 - 99 mg/dL  GLUCOSE, CAPILLARY     Status: Abnormal   Collection Time    01/16/14 11:52 AM      Result Value Ref Range   Glucose-Capillary 119 (*) 70 - 99 mg/dL  GLUCOSE, CAPILLARY     Status: Abnormal   Collection Time    01/16/14  5:30 PM      Result Value Ref Range   Glucose-Capillary 130 (*) 70 - 99 mg/dL  GLUCOSE, CAPILLARY     Status: Abnormal   Collection Time    01/16/14  9:30 PM      Result Value Ref Range   Glucose-Capillary 137 (*) 70 - 99 mg/dL  CBC     Status: Abnormal   Collection Time    01/17/14  6:40 AM      Result Value Ref Range   WBC 10.9 (*) 4.0 - 10.5 K/uL   RBC 3.18 (*) 4.22 - 5.81 MIL/uL   Hemoglobin 10.1 (*) 13.0 - 17.0 g/dL   HCT 30.0 (*) 39.0 - 52.0 %   MCV 94.3  78.0 - 100.0 fL   MCH 31.8  26.0 - 34.0 pg   MCHC 33.7  30.0 - 36.0 g/dL   RDW 14.8  11.5 - 15.5 %   Platelets 318  150 - 400 K/uL  BASIC METABOLIC PANEL       Status: Abnormal   Collection Time    01/17/14  6:40 AM      Result Value Ref Range   Sodium 136 (*) 137 - 147 mEq/L   Potassium 5.5 (*) 3.7 - 5.3 mEq/L   Chloride 91 (*) 96 - 112 mEq/L   CO2 22  19 - 32 mEq/L   Glucose, Bld 143 (*) 70 - 99 mg/dL   BUN 95 (*) 6 - 23 mg/dL   Creatinine, Ser 14.29 (*) 0.50 - 1.35 mg/dL   Calcium 9.7  8.4 - 10.5 mg/dL   GFR calc non Af Amer 3 (*) >90 mL/min   GFR calc Af Amer 3 (*) >90 mL/min   Comment: (NOTE)     The eGFR has been calculated using the CKD EPI equation.     This calculation has not been validated in all clinical situations.     eGFR's persistently <90 mL/min signify possible Chronic Kidney     Disease.  GLUCOSE, CAPILLARY     Status: Abnormal   Collection Time    01/17/14  7:18 AM      Result Value Ref Range   Glucose-Capillary 129 (*) 70 - 99 mg/dL   GLUCOSE, CAPILLARY     Status: Abnormal   Collection Time    01/17/14  4:43 PM      Result Value Ref Range   Glucose-Capillary 167 (*) 70 - 99 mg/dL  GLUCOSE, CAPILLARY     Status: Abnormal   Collection Time    01/17/14  9:07 PM      Result Value Ref Range   Glucose-Capillary 132 (*) 70 - 99 mg/dL    Mr Brain Wo Contrast  01/16/2014   CLINICAL DATA:  Stroke  EXAM: MRI HEAD WITHOUT CONTRAST  TECHNIQUE: Multiplanar, multiecho pulse sequences of the brain and surrounding structures were obtained without intravenous contrast.  COMPARISON:  MRI 12/18/2013.  CT 01/15/2014  FINDINGS: Prior right temporal craniotomy. Encephalomalacia right temporal lobe is stable.  Mild atrophy.  Negative for hydrocephalus.  Negative for acute infarct.  Negative for mass lesion.  IMPRESSION: No acute abnormality.   Electronically Signed   By: Charles  Clark M.D.   On: 01/16/2014 20:36   Mr Lumbar Spine Wo Contrast  01/16/2014   CLINICAL DATA:  Right lower extremity pain.  Dialysis patient.  EXAM: MRI LUMBAR SPINE WITHOUT CONTRAST  TECHNIQUE: Multiplanar, multisequence MR imaging of the lumbar spine was performed. No intravenous contrast was administered.  COMPARISON:  None.  FINDINGS: Negative for acute fracture or mass. Normal lumbar alignment. Conus medullaris is normal and terminates at L1-2.  Small gallstones are present.  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  L1-2: Bilateral facet hypertrophy with mild to moderate spinal stenosis  L2-3: Diffuse disc bulging bilateral facet hypertrophy and moderate spinal stenosis. Left foraminal disc protrusion is present  L3-4: Diffuse disc bulging and bilateral facet hypertrophy. Moderate to severe spinal stenosis. Bilateral foraminal narrowing  L4-5: Disc bulging and spondylosis. Central disc protrusion and moderate facet hypertrophy. Severe spinal stenosis. Lateral recess and foraminal encroachment bilaterally.  L5-S1: Disc bulging and spondylosis.  Bilateral facet hypertrophy. Spinal stenosis and foraminal encroachment bilaterally of mild-to-moderate degree.  IMPRESSION: The patient has a congenitally small spinal canal. There is superimposed degenerative change and spinal stenosis at multiple levels. Spinal stenosis most severe at L3-4 and L4-5. Small left foraminal disc protrusion at L2-3.  Small gallstones.   Electronically Signed   By: Charles  Clark M.D.     On: 01/16/2014 20:41   US Renal  01/16/2014   CLINICAL DATA:  Recurrent UTI  EXAM: RENAL/URINARY TRACT ULTRASOUND COMPLETE  COMPARISON:  None.  FINDINGS: Right Kidney:  Length: 10.8 cm. There is mild increased echogenicity of the renal cortex. No mass or hydronephrosis.  Left Kidney:  Length: 12.2 cm. There is mild increased echogenicity of the renal cortex. No mass or hydronephrosis.  Bladder:  Appears normal for degree of bladder distention.  IMPRESSION: 1. No obstructive uropathy. 2. Mild increased renal cortical echogenicity as can be seen with medical renal disease.   Electronically Signed   By: Kathreen Devoid   On: 01/16/2014 19:18    Review of Systems  Constitutional: Negative.   HENT: Negative.   Musculoskeletal: Positive for back pain.  Skin: Negative.   Neurological: Positive for focal weakness.   Blood pressure 118/68, pulse 74, temperature 98.2 F (36.8 C), temperature source Oral, resp. rate 18, height 6' 4" (1.93 m), weight 118 kg (260 lb 2.3 oz), SpO2 97.00%. Physical Exam  Assessment/Plan: Lumbar spinal stenosis, weakness in both lower extremities.  I reviewed the patient's scans and I do not believe he has a surgical lesion though he does have evidence of some diffuse spondylitic disease and stenosis at several levels.  Kristeen Miss 01/17/2014, 11:48 PM

## 2014-01-17 NOTE — Progress Notes (Signed)
[facility] PROGRESS NOTE  [name] [name] TMH:[id number] DOB: 06-Apr-1941 DOA: 01/15/2014 PCP: Donetta Potts, MD  Assessment/Plan: 73 year old admitted with encephalopathy probably related to UTI.   Acute encephalopathy -could be 2 to infection Vs opioids. Improving.  - EEG negative for epileptic disorder  -MRI brain negative for stroke.  -Ammonia normal at 30.  Valproic acid at 54. -Hold percocet.  resume gabapentin.   Right lower extremity pain - differentials include osteoarthritis of the right hip but patient's pain radiates all the way to the ankle so MRI of the L-spine ordered.  Patient denies LE pain with ambulation. Doppler: There is no DVT or SVT noted in the right lower extremity.ABI; Calcified. MRI with Small left foraminal disc protrusion at L2-3, and spinal stenosis at multiple levels. -Neurosurgery consulted.   UTI - follow final urine cultures. Continue with  Ceftriaxone. Urine growing E coli. Dr Junious Silk with urology  recommend out patient cystoscopy.   ESRD on hemodialysis - Dialysis: Tuesday Thursdays and Saturday. Renal following.  Seizure disorder - Continue with Depakote.  Chronic anemia - probably from ESRD. Follow CBC.   Code Status: Full Code.  Family Communication: Care discussed with Wife  Disposition Plan: Remain inpatient.    Consultants:  Renal.   Procedures: MRI lumbar spine: The patient has a congenitally small spinal canal. There is superimposed degenerative change and spinal stenosis at multiple levels. Spinal stenosis most severe at L3-4 and L4-5. Small left foraminal disc protrusion at L2-3.  EEG; This EEG is abnormal with mild to moderate generalized continuous nonspecific slowing of cerebral activity. This pattern of slowing can be seen with degenerative as well as metabolic and toxic encephalopathies. No evidence of an epileptic disorder was demonstrated.   Antibiotics: Ceftriaxone 5-4.   HPI/Subjective: Alert, still with slow  respond to questions. I saw him during dialysis. He still complaining of right leg pain, notice some improvement.   Objective: Filed Vitals:   01/17/14 1333  BP: 124/61  Pulse: 81  Temp: 98.5 F (36.9 C)  Resp: 18    Intake/Output Summary (Last 24 hours) at 01/17/14 1352 Last data filed at 01/17/14 1312  Gross per 24 hour  Intake    240 ml  Output   2457 ml  Net  -2217 ml   Filed Weights   01/17/14 9211 01/17/14 0849 01/17/14 1312  Weight: 115.985 kg (255 lb 11.2 oz) 119.9 kg (264 lb 5.3 oz) 118 kg (260 lb 2.3 oz)    Exam:   General:  Alert, in no distress.   Cardiovascular: S 1, S 2 RRR  Respiratory: distant lungs sound.   Abdomen: Bs present, soft, NT  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 on right. Left BKA.   Data Reviewed: Basic Metabolic Panel:  Recent Labs Lab 01/15/14 1358 01/16/14 0405 01/17/14 0640  NA 137 138 136*  K 5.3 5.3 5.5*  CL 91* 93* 91*  CO2 23 24 22   GLUCOSE 119* 76 143*  BUN 66* 75* 95*  CREATININE 11.57* 12.94* 14.29*  CALCIUM 10.0 9.7 9.7   Liver Function Tests:  Recent Labs Lab 01/15/14 1717 01/16/14 0405  AST 16 17  ALT <5 <5  ALKPHOS 56 56  BILITOT 0.3 0.2*  PROT 8.0 7.1  ALBUMIN 2.9* 2.6*   No results found for this basename: LIPASE, AMYLASE,  in the last 168 hours  Recent Labs Lab 01/15/14 1717  AMMONIA 30   CBC:  Recent Labs Lab 01/15/14 1358 01/16/14 0405 01/17/14 0640  WBC 12.7* 10.4 10.9*  NEUTROABS  --  5.5  --   HGB 10.5* 9.5* 10.1*  HCT 31.6* 29.1* 30.0*  MCV 96.6 96.4 94.3  PLT 353 323 318   Cardiac Enzymes: No results found for this basename: CKTOTAL, CKMB, CKMBINDEX, TROPONINI,  in the last 168 hours BNP (last 3 results)  Recent Labs  05/26/13 1700 01/15/14 1316  PROBNP 699.3* 1399.0*   CBG:  Recent Labs Lab 01/16/14 0742 01/16/14 1152 01/16/14 1730 01/16/14 2130 01/17/14 0718  GLUCAP 75 119* 130* 137* 129*    Recent Results (from the past 240 hour(s))  URINE CULTURE     Status:  None   Collection Time    01/15/14  6:08 PM      Result Value Ref Range Status   Specimen Description URINE, CLEAN CATCH   Final   Special Requests ADDED 1958   Final   Culture  Setup Time     Final   Value: 01/15/2014 20:37     Performed at [facility]     Final   Value: >=100,000 COLONIES/ML     Performed at [facility]   Culture     Final   Value: ESCHERICHIA COLI     Performed at [facility]   Report Status PENDING   Incomplete     Studies: Dg Chest 2 View  01/15/2014   CLINICAL DATA:  Leg swelling.  Lethargy.  EXAM: CHEST  2 VIEW  COMPARISON:  09/30/2013.  FINDINGS: Poor inspiration. Mild bibasilar atelectasis. Grossly normal sized heart. Thoracic spine degenerative changes.  IMPRESSION: Poor inspiration with mild bibasilar atelectasis.   Electronically Signed   By: Enrique Sack M.D.   On: 01/15/2014 14:36   Ct Head Wo Contrast  01/15/2014   CLINICAL DATA:  Head pain  EXAM: CT HEAD WITHOUT CONTRAST  TECHNIQUE: Contiguous axial images were obtained from the base of the skull through the vertex without intravenous contrast.  COMPARISON:  Brain MRI, 12/18/2013 Hand head CT, 09/08/13.  FINDINGS: Ventricles are normal in configuration. There is ventricular and sulcal enlargement reflecting mild to moderate atrophy  No parenchymal masses or mass effect. There is no evidence of a recent infarct. Encephalomalacia along the right temporal lobe is stable due to a previous right frontotemporal craniotomy.  No extra-axial masses or abnormal fluid collections.  There is no intracranial hemorrhage right craniotomy changes are stable. Minimal maxillary sinus mucosal thickening. Remaining sinuses and mastoid air cells are clear.  IMPRESSION: 1. No acute intracranial abnormalities. No change from the prior study.   Electronically Signed   By: Lajean Manes M.D.   On: 01/15/2014 19:10   Mr Brain Wo Contrast  01/16/2014   CLINICAL DATA:  Stroke  EXAM: MRI HEAD  WITHOUT CONTRAST  TECHNIQUE: Multiplanar, multiecho pulse sequences of the brain and surrounding structures were obtained without intravenous contrast.  COMPARISON:  MRI 12/18/2013.  CT 01/15/2014  FINDINGS: Prior right temporal craniotomy. Encephalomalacia right temporal lobe is stable.  Mild atrophy.  Negative for hydrocephalus.  Negative for acute infarct.  Negative for mass lesion.  IMPRESSION: No acute abnormality.   Electronically Signed   By: Franchot Gallo M.D.   On: 01/16/2014 20:36   Mr Lumbar Spine Wo Contrast  01/16/2014   CLINICAL DATA:  Right lower extremity pain.  Dialysis patient.  EXAM: MRI LUMBAR SPINE WITHOUT CONTRAST  TECHNIQUE: Multiplanar, multisequence MR imaging of the lumbar spine was performed. No intravenous contrast was administered.  COMPARISON:  None.  FINDINGS: Negative for acute fracture or mass. Normal lumbar alignment. Conus medullaris is normal and terminates at L1-2.  Small gallstones are present.  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  L1-2: Bilateral facet hypertrophy with mild to moderate spinal stenosis  L2-3: Diffuse disc bulging bilateral facet hypertrophy and moderate spinal stenosis. Left foraminal disc protrusion is present  L3-4: Diffuse disc bulging and bilateral facet hypertrophy. Moderate to severe spinal stenosis. Bilateral foraminal narrowing  L4-5: Disc bulging and spondylosis. Central disc protrusion and moderate facet hypertrophy. Severe spinal stenosis. Lateral recess and foraminal encroachment bilaterally.  L5-S1: Disc bulging and spondylosis. Bilateral facet hypertrophy. Spinal stenosis and foraminal encroachment bilaterally of mild-to-moderate degree.  IMPRESSION: The patient has a congenitally small spinal canal. There is superimposed degenerative change and spinal stenosis at multiple levels. Spinal stenosis most severe at L3-4 and L4-5. Small left foraminal disc protrusion at L2-3.  Small gallstones.   Electronically  Signed   By: Franchot Gallo M.D.   On: 01/16/2014 20:41   US Renal  01/16/2014   CLINICAL DATA:  Recurrent UTI  EXAM: RENAL/URINARY TRACT ULTRASOUND COMPLETE  COMPARISON:  None.  FINDINGS: Right Kidney:  Length: 10.8 cm. There is mild increased echogenicity of the renal cortex. No mass or hydronephrosis.  Left Kidney:  Length: 12.2 cm. There is mild increased echogenicity of the renal cortex. No mass or hydronephrosis.  Bladder:  Appears normal for degree of bladder distention.  IMPRESSION: 1. No obstructive uropathy. 2. Mild increased renal cortical echogenicity as can be seen with medical renal disease.   Electronically Signed   By: Kathreen Devoid   On: 01/16/2014 19:18    Scheduled Meds: . aspirin EC  325 mg Oral Daily  . atorvastatin  30 mg Oral QHS  . folic acid  1 mg Oral Daily   And  . B-complex with vitamin C  1 tablet Oral Daily  . carbidopa-levodopa  0.5 tablet Oral TID  . cefTRIAXone (ROCEPHIN)  IV  1 g Intravenous Q24H  . darbepoetin  40 mcg Intravenous Q Tue-HD  . divalproex  1,500 mg Oral Daily  . docusate sodium  100 mg Oral QHS  . escitalopram  10 mg Oral Daily  . ferric gluconate (FERRLECIT/NULECIT) IV  125 mg Intravenous Q T,Th,Sa-HD  . gabapentin  300 mg Oral QHS  . insulin aspart  0-9 Units Subcutaneous TID WC  . lanthanum  2,000 mg Oral TID WC  . metoprolol tartrate  6.25 mg Oral BID   Continuous Infusions:   Principal Problem:   Acute encephalopathy Active Problems:   ESRD (end stage renal disease) on dialysis   Paroxysmal atrial fibrillation   UTI (lower urinary tract infection)   Lower extremity pain, right    Time spent: 35 minutes.     [facility] Pager [phone]. If 7PM-7AM, please contact night-coverage at [email or web address], password [facility] 01/17/2014, 1:52 PM  LOS: 2 days

## 2014-01-17 NOTE — Progress Notes (Signed)
Subjective:   No complaints. HD went well  Objective Filed Vitals:   01/17/14 1230 01/17/14 1256 01/17/14 1312 01/17/14 1333  BP: 107/70 123/76 124/70 124/61  Pulse: 73 76 74 81  Temp:   97.9 F (36.6 C) 98.5 F (36.9 C)  TempSrc:   Oral Oral  Resp: 14 12 18 18   Height:      Weight:   118 kg (260 lb 2.3 oz)   SpO2:   98% 100%   Physical Exam General: alert and oriented. No acute distress Heart: RRR Lungs: CTA, unlabored Abdomen: soft, nontender +BS Extremities: L BKA, no RLE edema Dialysis Access: L AVF  [facility]  4.5h 117.5kg 2/2.0 [place] Heparin 8000 LUA AVF  EPO 5000 Venofer 100/hd thru 5/19   Assessment/Plan: 1. AMS- workup per primary. EEG negative. MRI negative for stroke 2. UTI- + for Ecoli. on rocephin, urology consulted  3. ESRD - TTS @ [facility]. HD today with 1900 removed. 4. Anemia - hgb 10.1, aranesp q Tuesday. Cont Fe q HD 5. Secondary hyperparathyroidism - Ca+9.7 fosrenol 6. HTN/volume - 124/61. Cont metpo 7. Nutrition - renal diet, nepro 8. DM- per primary  Shelle Iron, NP [place] [phone] 01/17/2014,2:54 PM  LOS: 2 days   Pt seen, examined and agree w A/P as above.  Kelly Splinter MD pager [phone]    cell [phone] 01/17/2014, 5:19 PM    Additional Objective Labs: Basic Metabolic Panel:  Recent Labs Lab 01/15/14 1358 01/16/14 0405 01/17/14 0640  NA 137 138 136*  K 5.3 5.3 5.5*  CL 91* 93* 91*  CO2 23 24 22   GLUCOSE 119* 76 143*  BUN 66* 75* 95*  CREATININE 11.57* 12.94* 14.29*  CALCIUM 10.0 9.7 9.7   Liver Function Tests:  Recent Labs Lab 01/15/14 1717 01/16/14 0405  AST 16 17  ALT <5 <5  ALKPHOS 56 56  BILITOT 0.3 0.2*  PROT 8.0 7.1  ALBUMIN 2.9* 2.6*   No results found for this basename: LIPASE, AMYLASE,  in the last 168 hours CBC:  Recent Labs Lab 01/15/14 1358 01/16/14 0405 01/17/14 0640  WBC 12.7* 10.4 10.9*  NEUTROABS  --  5.5  --   HGB 10.5* 9.5* 10.1*  HCT 31.6* 29.1*  30.0*  MCV 96.6 96.4 94.3  PLT 353 323 318   Blood Culture    Component Value Date/Time   SDES URINE, CLEAN CATCH 01/15/2014 [place] 01/15/2014 1808   CULT  Value: ESCHERICHIA COLI Performed at [facility] 01/15/2014 1808   REPTSTATUS 01/17/2014 FINAL 01/15/2014 1808    Cardiac Enzymes: No results found for this basename: CKTOTAL, CKMB, CKMBINDEX, TROPONINI,  in the last 168 hours CBG:  Recent Labs Lab 01/16/14 0742 01/16/14 1152 01/16/14 1730 01/16/14 2130 01/17/14 0718  GLUCAP 75 119* 130* 137* 129*   Iron Studies: No results found for this basename: IRON, TIBC, TRANSFERRIN, FERRITIN,  in the last 72 hours @lablastinr3 @ Studies/Results: Ct Head Wo Contrast  01/15/2014   CLINICAL DATA:  Head pain  EXAM: CT HEAD WITHOUT CONTRAST  TECHNIQUE: Contiguous axial images were obtained from the base of the skull through the vertex without intravenous contrast.  COMPARISON:  Brain MRI, 12/18/2013 Hand head CT, 09/08/13.  FINDINGS: Ventricles are normal in configuration. There is ventricular and sulcal enlargement reflecting mild to moderate atrophy  No parenchymal masses or mass effect. There is no evidence of a recent infarct. Encephalomalacia along the right temporal lobe is stable due to  a previous right frontotemporal craniotomy.  No extra-axial masses or abnormal fluid collections.  There is no intracranial hemorrhage right craniotomy changes are stable. Minimal maxillary sinus mucosal thickening. Remaining sinuses and mastoid air cells are clear.  IMPRESSION: 1. No acute intracranial abnormalities. No change from the prior study.   Electronically Signed   By: Lajean Manes M.D.   On: 01/15/2014 19:10   Mr Brain Wo Contrast  01/16/2014   CLINICAL DATA:  Stroke  EXAM: MRI HEAD WITHOUT CONTRAST  TECHNIQUE: Multiplanar, multiecho pulse sequences of the brain and surrounding structures were obtained without intravenous contrast.  COMPARISON:  MRI 12/18/2013.  CT  01/15/2014  FINDINGS: Prior right temporal craniotomy. Encephalomalacia right temporal lobe is stable.  Mild atrophy.  Negative for hydrocephalus.  Negative for acute infarct.  Negative for mass lesion.  IMPRESSION: No acute abnormality.   Electronically Signed   By: Franchot Gallo M.D.   On: 01/16/2014 20:36   Mr Lumbar Spine Wo Contrast  01/16/2014   CLINICAL DATA:  Right lower extremity pain.  Dialysis patient.  EXAM: MRI LUMBAR SPINE WITHOUT CONTRAST  TECHNIQUE: Multiplanar, multisequence MR imaging of the lumbar spine was performed. No intravenous contrast was administered.  COMPARISON:  None.  FINDINGS: Negative for acute fracture or mass. Normal lumbar alignment. Conus medullaris is normal and terminates at L1-2.  Small gallstones are present.  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  L1-2: Bilateral facet hypertrophy with mild to moderate spinal stenosis  L2-3: Diffuse disc bulging bilateral facet hypertrophy and moderate spinal stenosis. Left foraminal disc protrusion is present  L3-4: Diffuse disc bulging and bilateral facet hypertrophy. Moderate to severe spinal stenosis. Bilateral foraminal narrowing  L4-5: Disc bulging and spondylosis. Central disc protrusion and moderate facet hypertrophy. Severe spinal stenosis. Lateral recess and foraminal encroachment bilaterally.  L5-S1: Disc bulging and spondylosis. Bilateral facet hypertrophy. Spinal stenosis and foraminal encroachment bilaterally of mild-to-moderate degree.  IMPRESSION: The patient has a congenitally small spinal canal. There is superimposed degenerative change and spinal stenosis at multiple levels. Spinal stenosis most severe at L3-4 and L4-5. Small left foraminal disc protrusion at L2-3.  Small gallstones.   Electronically Signed   By: Franchot Gallo M.D.   On: 01/16/2014 20:41   US Renal  01/16/2014   CLINICAL DATA:  Recurrent UTI  EXAM: RENAL/URINARY TRACT ULTRASOUND COMPLETE  COMPARISON:  None.  FINDINGS:  Right Kidney:  Length: 10.8 cm. There is mild increased echogenicity of the renal cortex. No mass or hydronephrosis.  Left Kidney:  Length: 12.2 cm. There is mild increased echogenicity of the renal cortex. No mass or hydronephrosis.  Bladder:  Appears normal for degree of bladder distention.  IMPRESSION: 1. No obstructive uropathy. 2. Mild increased renal cortical echogenicity as can be seen with medical renal disease.   Electronically Signed   By: Kathreen Devoid   On: 01/16/2014 19:18   Medications:   . aspirin EC  325 mg Oral Daily  . atorvastatin  30 mg Oral QHS  . folic acid  1 mg Oral Daily   And  . B-complex with vitamin C  1 tablet Oral Daily  . carbidopa-levodopa  0.5 tablet Oral TID  . cefTRIAXone (ROCEPHIN)  IV  1 g Intravenous Q24H  . darbepoetin  40 mcg Intravenous Q Tue-HD  . divalproex  1,500 mg Oral Daily  . docusate sodium  100 mg Oral QHS  . escitalopram  10 mg Oral Daily  . ferric gluconate (FERRLECIT/NULECIT)  IV  125 mg Intravenous Q T,Th,Sa-HD  . gabapentin  300 mg Oral QHS  . insulin aspart  0-9 Units Subcutaneous TID WC  . lanthanum  2,000 mg Oral TID WC  . metoprolol tartrate  6.25 mg Oral BID

## 2014-01-18 DIAGNOSIS — I6350 Cerebral infarction due to unspecified occlusion or stenosis of unspecified cerebral artery: Secondary | ICD-10-CM

## 2014-01-18 DIAGNOSIS — I70269 Atherosclerosis of native arteries of extremities with gangrene, unspecified extremity: Secondary | ICD-10-CM

## 2014-01-18 LAB — GLUCOSE, CAPILLARY
GLUCOSE-CAPILLARY: 151 mg/dL — AB (ref 70–99)
Glucose-Capillary: 92 mg/dL (ref 70–99)
Glucose-Capillary: 115 mg/dL — ABNORMAL HIGH (ref 70–99)
Glucose-Capillary: 136 mg/dL — ABNORMAL HIGH (ref 70–99)

## 2014-01-18 MED ORDER — ACETAMINOPHEN 325 MG PO TABS
650.0000 mg | ORAL_TABLET | ORAL | Status: DC | PRN
  Administered 2014-01-19 – 2014-01-20 (×2): 650 mg via ORAL
  Filled 2014-01-18 (×3): qty 2

## 2014-01-18 MED ORDER — NEPRO/CARBSTEADY PO LIQD
237.0000 mL | Freq: Two times a day (BID) | ORAL | Status: DC
  Administered 2014-01-18 – 2014-01-23 (×7): 237 mL via ORAL
  Filled 2014-01-18 (×15): qty 237

## 2014-01-18 NOTE — Evaluation (Signed)
Speech Language Pathology Evaluation Patient Details Name: [name] [name] MRN: [id number] DOB: 09/06/1941 Today's Date: 01/18/2014 Time: 5809-9833 SLP Time Calculation (min): 57 min  Problem List:  Patient Active Problem List   Diagnosis Date Noted  . UTI (lower urinary tract infection) 01/15/2014  . Acute encephalopathy 01/15/2014  . Lower extremity pain, right 01/15/2014  . Expressive aphasia 12/16/2013  . UTI (urinary tract infection) 06/21/2013  . Acute CVA (cerebrovascular accident) 06/19/2013  . Intermediate coronary syndrome 05/26/2013  . Refusal of blood transfusions as patient is Jehovah's Witness 05/26/2013  . Hypertension   . Hyperlipidemia   . Paroxysmal atrial fibrillation   . Seizure disorder   . Mixed oligoastrocytoma   . Diabetes mellitus without complication   . GERD (gastroesophageal reflux disease)   . Complex partial seizures 02/10/2013  . 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 Atherosclerotic peripheral vascular disease with gangrene 09/01/2012  . ESRD (end stage renal disease) on dialysis 09/15/2010   Past Medical History:  Past Medical History  Diagnosis Date  . ESRD on hemodialysis     Started HD around 2009, maybe longer.  Was living in [place], [place] then.  Moved to [place] in 2014 and gets HD now at [facility] on [address] on a TTS schedule.  ESRD was due to DM and HTN.     Marland Kitchen Peripheral vascular disease   . Hypertension   . Hyperlipidemia   . Arthritis     Gout  . Paroxysmal atrial fibrillation   . Brain tumor 2009  . Seizure disorder 2009  . Anemia   . Hyperparathyroidism, secondary   . Diabetes mellitus without complication     Type II, diet controlled  . Proteus mirabilis infection   . CHF (congestive heart failure)     grade 1 DD, preserved EF, [facility] records  . 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 brain tumor  . GERD (gastroesophageal reflux disease)   . Hx of cardiac cath      a. LHC (9/14):  Normal cors, EF 65%.    Past Surgical History:  Past Surgical History  Procedure Laterality Date  . Av fistula placement Left     arm  . 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 Eye surgery Bilateral 2000    cataract  . Retinopathy surgery      Hx. of laser treatments for diabetics   HPI:  73 year old male admitted 01/15/14 RLE pain and confusion. Pt dx with encephalopathy due to UTI. PMH significant for GERD, DM, ESRD ( HD Tue, Thur, Sat), PVD, L BKA, Seizures, brain tumor.  SLE ordered to evaluate cognition.   Assessment / Plan / Recommendation Clinical Impression  The Montreal Cognitive Assessment (MoCA) was administered. Pt scored 18/30 (n=26/30). Deficits noted on the following subtests: visuospatial/executive, immediate and delayed recall, serial subtraction, sentence repetition, mental flexibility, and orientation. Pt indicated current location as [facility], and that he and his wife have been married a little over a year.  Based on current functioning, 24 hour supervision is recommended.  However, cognition may improve as UTI/encephalopathy resolve. Recommend re-evaluation by SLP at next venue to determine need for continued services.  No family present at this time to discuss cognitive status prior to this admit, however, RN reported that family indicated pt is currently close to his baseline.     SLP Assessment  All further  Speech Lanaguage Pathology  needs can be addressed in the next venue of care    Follow Up Recommendations  Skilled Nursing facility;24 hour supervision/assistance    Frequency and Duration   n/a      Pertinent Vitals/Pain VSS, no pain reported   SLP Goals   recommend f/u at next venue  SLP Evaluation Prior Functioning  Cognitive/Linguistic Baseline: Information not available Education: 12th grade Vocation: Retired   Associate Professor  Overall Cognitive Status: Impaired/Different  from baseline Arousal/Alertness: Awake/alert Orientation Level: Oriented to person Memory: Impaired Memory Impairment: Storage deficit;Retrieval deficit;Decreased recall of new information;Decreased short term memory Awareness: Impaired Awareness Impairment: Intellectual impairment;Emergent impairment;Anticipatory impairment Problem Solving: Impaired Problem Solving Impairment: Verbal basic Executive Function: Reasoning Reasoning: Impaired Reasoning Impairment: Verbal basic Safety/Judgment: Impaired    Comprehension  Auditory Comprehension Overall Auditory Comprehension: Appears within functional limits for tasks assessed Visual Recognition/Discrimination Discrimination: Not tested Reading Comprehension Reading Status: Not tested    Expression Expression Primary Mode of Expression: Verbal Verbal Expression Overall Verbal Expression: Appears within functional limits for tasks assessed Written Expression Dominant Hand: Right Written Expression: Not tested   Oral / Motor Oral Motor/Sensory Function Overall Oral Motor/Sensory Function: Appears within functional limits for tasks assessed Motor Speech Overall Motor Speech: Appears within functional limits for tasks assessed   GO    Taia Bramlett [facility], CCC-SLP [phone] [place] 01/18/2014, 4:25 PM

## 2014-01-18 NOTE — Progress Notes (Signed)
Subjective:   Feeling much better. No complaints  Objective Filed Vitals:   01/17/14 2344 01/18/14 0400 01/18/14 0726 01/18/14 1206  BP: 118/68 128/72 127/68 151/75  Pulse: 74 74 64 71  Temp: 98.2 F (36.8 C) 97.8 F (36.6 C) 97.8 F (36.6 C) 98 F (36.7 C)  TempSrc: Oral Oral Oral Oral  Resp: 18 18 17 19   Height:      Weight:  116.3 kg (256 lb 6.3 oz)    SpO2: 97% 98% 96% 99%   Physical Exam  General: alert and oriented. No acute distress  Heart: RRR  Lungs: CTA, unlabored  Abdomen: soft, nontender +BS  Extremities: L BKA, no RLE edema  Dialysis Access: L AVF +bruit/thrill  D[facility]  4.5h 117.5kg 2/2.0 [place] Heparin 8000 LUA AVF  EPO 5000 Venofer 100/hd thru 5/19    Assessment/Plan:  1. AMS- workup per primary. EEG negative. MRI negative for stroke  2. UTI- + for Ecoli. on rocephin, urology consulted  3. ESRD - TTS @ [facility]. HD tomorrow. 4. Anemia - hgb 10.1, aranesp q Tuesday. Cont Fe q HD  5. Secondary hyperparathyroidism - Ca+9.7 fosrenol  6. HTN/volume - 151/75. Cont metop, no volume excess 7. Nutrition - alb 2.6, renal/carb motified diet, nepro, multi vit 8. DM- per primary  Shelle Iron, NP [place] [phone] 01/18/2014,3:43 PM  LOS: 3 days   Pt seen, examined and agree w A/P as above.  Kelly Splinter MD pager [phone]    cell [phone] 01/18/2014, 4:01 PM     Additional Objective Labs: Basic Metabolic Panel:  Recent Labs Lab 01/15/14 1358 01/16/14 0405 01/17/14 0640  NA 137 138 136*  K 5.3 5.3 5.5*  CL 91* 93* 91*  CO2 23 24 22   GLUCOSE 119* 76 143*  BUN 66* 75* 95*  CREATININE 11.57* 12.94* 14.29*  CALCIUM 10.0 9.7 9.7   Liver Function Tests:  Recent Labs Lab 01/15/14 1717 01/16/14 0405  AST 16 17  ALT <5 <5  ALKPHOS 56 56  BILITOT 0.3 0.2*  PROT 8.0 7.1  ALBUMIN 2.9* 2.6*   No results found for this basename: LIPASE, AMYLASE,  in the last 168 hours CBC:  Recent Labs Lab 01/15/14 1358  01/16/14 0405 01/17/14 0640  WBC 12.7* 10.4 10.9*  NEUTROABS  --  5.5  --   HGB 10.5* 9.5* 10.1*  HCT 31.6* 29.1* 30.0*  MCV 96.6 96.4 94.3  PLT 353 323 318   Blood Culture    Component Value Date/Time   SDES URINE, CLEAN CATCH 01/15/2014 [place] 01/15/2014 1808   CULT  Value: ESCHERICHIA COLI Performed at [facility] 01/15/2014 1808   REPTSTATUS 01/17/2014 FINAL 01/15/2014 1808    Cardiac Enzymes: No results found for this basename: CKTOTAL, CKMB, CKMBINDEX, TROPONINI,  in the last 168 hours CBG:  Recent Labs Lab 01/17/14 0718 01/17/14 1643 01/17/14 2107 01/18/14 0728 01/18/14 1116  GLUCAP 129* 167* 132* 92 115*   Iron Studies: No results found for this basename: IRON, TIBC, TRANSFERRIN, FERRITIN,  in the last 72 hours @lablastinr3 @ Studies/Results: Mr Brain Wo Contrast  01/16/2014   CLINICAL DATA:  Stroke  EXAM: MRI HEAD WITHOUT CONTRAST  TECHNIQUE: Multiplanar, multiecho pulse sequences of the brain and surrounding structures were obtained without intravenous contrast.  COMPARISON:  MRI 12/18/2013.  CT 01/15/2014  FINDINGS: Prior right temporal craniotomy. Encephalomalacia right temporal lobe is stable.  Mild atrophy.  Negative for hydrocephalus.  Negative for acute infarct.  Negative for mass lesion.  IMPRESSION: No acute abnormality.   Electronically Signed   By: Franchot Gallo M.D.   On: 01/16/2014 20:36   Mr Lumbar Spine Wo Contrast  01/16/2014   CLINICAL DATA:  Right lower extremity pain.  Dialysis patient.  EXAM: MRI LUMBAR SPINE WITHOUT CONTRAST  TECHNIQUE: Multiplanar, multisequence MR imaging of the lumbar spine was performed. No intravenous contrast was administered.  COMPARISON:  None.  FINDINGS: Negative for acute fracture or mass. Normal lumbar alignment. Conus medullaris is normal and terminates at L1-2.  Small gallstones are present.  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  L1-2: Bilateral facet hypertrophy  with mild to moderate spinal stenosis  L2-3: Diffuse disc bulging bilateral facet hypertrophy and moderate spinal stenosis. Left foraminal disc protrusion is present  L3-4: Diffuse disc bulging and bilateral facet hypertrophy. Moderate to severe spinal stenosis. Bilateral foraminal narrowing  L4-5: Disc bulging and spondylosis. Central disc protrusion and moderate facet hypertrophy. Severe spinal stenosis. Lateral recess and foraminal encroachment bilaterally.  L5-S1: Disc bulging and spondylosis. Bilateral facet hypertrophy. Spinal stenosis and foraminal encroachment bilaterally of mild-to-moderate degree.  IMPRESSION: The patient has a congenitally small spinal canal. There is superimposed degenerative change and spinal stenosis at multiple levels. Spinal stenosis most severe at L3-4 and L4-5. Small left foraminal disc protrusion at L2-3.  Small gallstones.   Electronically Signed   By: Franchot Gallo M.D.   On: 01/16/2014 20:41   US Renal  01/16/2014   CLINICAL DATA:  Recurrent UTI  EXAM: RENAL/URINARY TRACT ULTRASOUND COMPLETE  COMPARISON:  None.  FINDINGS: Right Kidney:  Length: 10.8 cm. There is mild increased echogenicity of the renal cortex. No mass or hydronephrosis.  Left Kidney:  Length: 12.2 cm. There is mild increased echogenicity of the renal cortex. No mass or hydronephrosis.  Bladder:  Appears normal for degree of bladder distention.  IMPRESSION: 1. No obstructive uropathy. 2. Mild increased renal cortical echogenicity as can be seen with medical renal disease.   Electronically Signed   By: Kathreen Devoid   On: 01/16/2014 19:18   Medications:   . aspirin EC  325 mg Oral Daily  . atorvastatin  30 mg Oral QHS  . folic acid  1 mg Oral Daily   And  . B-complex with vitamin C  1 tablet Oral Daily  . carbidopa-levodopa  0.5 tablet Oral TID  . cefTRIAXone (ROCEPHIN)  IV  1 g Intravenous Q24H  . darbepoetin  40 mcg Intravenous Q Tue-HD  . divalproex  1,500 mg Oral Daily  . docusate sodium  100  mg Oral QHS  . escitalopram  10 mg Oral Daily  . ferric gluconate (FERRLECIT/NULECIT) IV  125 mg Intravenous Q T,Th,Sa-HD  . gabapentin  300 mg Oral QHS  . insulin aspart  0-9 Units Subcutaneous TID WC  . lanthanum  2,000 mg Oral TID WC  . metoprolol tartrate  6.25 mg Oral BID

## 2014-01-18 NOTE — Progress Notes (Signed)
OT Cancellation Note  Patient Details Name: [name] [name] MRN: [id number] DOB: Jan 24, 1941   Cancelled Treatment:    Reason Eval/Treat Not Completed: Other (comment). Pt from SNF. PLans to D/C to SNF. Will deer OT to SNF. If eval needed, please call [phone] or text page OT at [phone].  [place], [place]  [phone] 01/18/2014 01/18/2014, 3:19 PM

## 2014-01-18 NOTE — Progress Notes (Signed)
Patient ID: [name] [name], male   DOB: [birth year], 73 y.o.   MRN: [id number] Alert oriented. Right hip and buttock pain bilaterally. Reviewed mri with radiology... Most significant finding regarding this mans symptoms relate to stenosis at l4-5. If not completely contraindicated would suggest epidural steroid injection at L4-5 translaminar  By radiology while here.

## 2014-01-18 NOTE — Progress Notes (Signed)
Clinical Social Work Department BRIEF PSYCHOSOCIAL ASSESSMENT 01/18/2014  Patient:  [name] [name],[name] [name]     Account Number:  [id number]     Admit date:  01/15/2014  Clinical Social Worker:  Adair Laundry  Date/Time:  01/18/2014 10:00 AM  Referred by:  Physician  Date Referred:  01/18/2014 Referred for  ALF Placement   Other Referral:   Interview type:  Patient Other interview type:   Spoke with both pt and pt wife at bedside    PSYCHOSOCIAL DATA Living Status:  FACILITY Admitted from facility:  [facility] Level of care:  Assisted Living Primary support name:  [name] [name] [phone] Primary support relationship to patient:  SPOUSE Degree of support available:   Pt has good support system    CURRENT CONCERNS Current Concerns  Post-Acute Placement   Other Concerns:    SOCIAL WORK ASSESSMENT / PLAN CSW made aware that pt was admitted from facility. CSW spoke wth pt and pt wife who confirmed pt was admitted from [facility]. CSW informed pt and pt wife that PT is currently recommending SNF or possibly pt may be able to return to ALF with increased assistance. Pt and pt wife informed CSW they would like for him to return to ALF but also want to do what is best and go by recommendations. CSW explained that CSW would send clinicals to [facility] and see if they felt they could handle pt increased level of assistance. Pt and pt wife agreeable this would be a good place to start.    CSW spoke with Clarene Critchley at facility and informed of pt current medical status. CSW faxed clinicals to facility. Clarene Critchley reviewed clinicals and called CSW to inform they could take pt back and would just require a PT order. Facility has PT team that pt could utilize. CSW will inform pt and pt wife.   Assessment/plan status:  Psychosocial Support/Ongoing Assessment of Needs Other assessment/ plan:   Information/referral to community  resources:   None needed at this time    PATIENT'S/FAMILY'S RESPONSE TO PLAN OF CARE: Pt is undecided on dc plan at this time       [place], [employer]

## 2014-01-18 NOTE — Progress Notes (Signed)
Patient ID: [name] [name]  male  DPO:[id number]    DOB: 04-04-1941    DOA: 01/15/2014  PCP: Donetta Potts, MD  Assessment/Plan: Principal Problem:   Acute encephalopathy possibly due to the UTI, medication, improving confirmed by his wife at the bedside -  EEG negative for epileptic disorder  - MRI brain negative for stroke.  - Ammonia normal at 30. Valproic acid at 54.  - Hold percocet. resume gabapentin.  - Continue Rocephin  Active Problems:   ESRD (end stage renal disease) on dialysis: TTS - Renal following, hemodialysis tomorrow  Right lower extremity pain - differentials include osteoarthritis of the right hip but patient's pain radiates all the way to the ankle so MRI of the L-spine ordered.  - Doppler: There is no DVT or SVT noted in the right lower extremity. - ABI; unable to ascertain ABI secondary to calcified vessels -  MRI of lumbar spine showed small left foraminal disc protrusion at L2-3, and spinal stenosis at multiple levels.  -Neurosurgery was consulted, seen by Dr. 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  - Continue pain control, outpatient cortisone injection if needed  Escherichia coli UTI (lower urinary tract infection) - Continue with Ceftriaxone. Urine growing E coli. Dr Junious Silk with urology recommended out patient cystoscopy  Seizure disorder - Continue Depakote   DVT Prophylaxis:  Code Status:  Family Communication: Discussed in detail with patient's wife at the bedside. Patient is currently resident of ALF, patient's wife reports that he is lot more deconditioned than he actually he is.    Disposition: PT eval pending, we'll follow recommendations, possibly DC tomorrow after hemodialysis    Consultants:  Neurosurgeon   Urology  Nephrology  Procedures:  EEG  Antibiotics:  IV rocephin      Subjective:  Patient seen and examined, still complaining of some back pain, wife at the bedside, mental status is improving. Per his wife he is more weak than he says.     Objective: Weight change: 3.915 kg (8 lb 10.1 oz)  Intake/Output Summary (Last 24 hours) at 01/18/14 1144 Last data filed at 01/18/14 0859  Gross per 24 hour  Intake    680 ml  Output   2157 ml  Net  -1477 ml   Blood pressure 127/68, pulse 64, temperature 97.8 F (36.6 C), temperature source Oral, resp. rate 17, height 6\' 4"  (1.93 m), weight 116.3 kg (256 lb 6.3 oz), SpO2 96.00%.  Physical Exam: General: Alert and awake, oriented x3, not in any acute distress. CVS: S1-S2 clear, no murmur rubs or gallops Chest: clear to auscultation bilaterally, no wheezing, rales or rhonchi Abdomen: soft nontender, nondistended, normal bowel sounds  Extremities left BKA   Lab Results: Basic Metabolic Panel:  Recent Labs Lab 01/16/14 0405 01/17/14 0640  NA 138 136*  K 5.3 5.5*  CL 93* 91*  CO2 24 22  GLUCOSE 76 143*  BUN 75* 95*  CREATININE 12.94* 14.29*  CALCIUM 9.7 9.7   Liver Function Tests:  Recent Labs Lab 01/15/14 1717 01/16/14 0405  AST 16 17  ALT <5 <5  ALKPHOS 56 56  BILITOT 0.3 0.2*  PROT 8.0 7.1  ALBUMIN 2.9* 2.6*   No results found for this basename: LIPASE, AMYLASE,  in the last 168 hours  Recent Labs Lab 01/15/14 1717  AMMONIA 30   CBC:  Recent Labs Lab 01/16/14 0405  01/17/14 0640  WBC 10.4 10.9*  NEUTROABS 5.5  --   HGB 9.5* 10.1*  HCT 29.1* 30.0*  MCV 96.4 94.3  PLT 323 318   Cardiac Enzymes: No results found for this basename: CKTOTAL, CKMB, CKMBINDEX, TROPONINI,  in the last 168 hours BNP: No components found with this basename: POCBNP,  CBG:  Recent Labs Lab 01/17/14 0718 01/17/14 1643 01/17/14 2107 01/18/14 0728 01/18/14 1116  GLUCAP 129* 167* 132* 92 115*     Micro Results: Recent Results (from the past 240 hour(s))  URINE CULTURE     Status:  None   Collection Time    01/15/14  6:08 PM      Result Value Ref Range Status   Specimen Description URINE, CLEAN CATCH   Final   Special Requests ADDED 1958   Final   Culture  Setup Time     Final   Value: 01/15/2014 20:37     Performed at [facility]     Final   Value: >=100,000 COLONIES/ML     Performed at [facility]   Culture     Final   Value: ESCHERICHIA COLI     Performed at [facility]   Report Status 01/17/2014 FINAL   Final   Organism ID, Bacteria ESCHERICHIA COLI   Final    Studies/Results: Dg Chest 2 View  01/15/2014   CLINICAL DATA:  Leg swelling.  Lethargy.  EXAM: CHEST  2 VIEW  COMPARISON:  09/30/2013.  FINDINGS: Poor inspiration. Mild bibasilar atelectasis. Grossly normal sized heart. Thoracic spine degenerative changes.  IMPRESSION: Poor inspiration with mild bibasilar atelectasis.   Electronically Signed   By: Enrique Sack M.D.   On: 01/15/2014 14:36   Dg Hip Complete Right  01/13/2014   CLINICAL DATA:  HIP PAIN  EXAM: RIGHT HIP - COMPLETE 2+ VIEW  COMPARISON:  None.  FINDINGS: There is no evidence of hip fracture or dislocation. Areas of hypertrophic spurring and periarticular sclerosis involving the femoral head and acetabulum. Atherosclerotic calcifications are appreciated. Soft tissues otherwise unremarkable. There is scattered areas of retained contrast within the colon greatest area of confluence in the rectosigmoid region.  IMPRESSION: Osteoarthritic changes without acute osseous abnormalities.   Electronically Signed   By: Margaree Mackintosh M.D.   On: 01/13/2014 12:21   Ct Head Wo Contrast  01/15/2014   CLINICAL DATA:  Head pain  EXAM: CT HEAD WITHOUT CONTRAST  TECHNIQUE: Contiguous axial images were obtained from the base of the skull through the vertex without intravenous contrast.  COMPARISON:  Brain MRI, 12/18/2013 Hand head CT, 09/08/13.  FINDINGS: Ventricles are normal in configuration. There is ventricular and sulcal  enlargement reflecting mild to moderate atrophy  No parenchymal masses or mass effect. There is no evidence of a recent infarct. Encephalomalacia along the right temporal lobe is stable due to a previous right frontotemporal craniotomy.  No extra-axial masses or abnormal fluid collections.  There is no intracranial hemorrhage right craniotomy changes are stable. Minimal maxillary sinus mucosal thickening. Remaining sinuses and mastoid air cells are clear.  IMPRESSION: 1. No acute intracranial abnormalities. No change from the prior study.   Electronically Signed   By: Lajean Manes M.D.   On: 01/15/2014 19:10   Mr Brain Wo Contrast  01/16/2014   CLINICAL DATA:  Stroke  EXAM: MRI HEAD WITHOUT CONTRAST  TECHNIQUE: Multiplanar, multiecho pulse sequences of the brain and surrounding structures were obtained without intravenous contrast.  COMPARISON:  MRI 12/18/2013.  CT 01/15/2014  FINDINGS: Prior right temporal craniotomy. Encephalomalacia right temporal lobe is stable.  Mild atrophy.  Negative for hydrocephalus.  Negative for acute infarct.  Negative for mass lesion.  IMPRESSION: No acute abnormality.   Electronically Signed   By: Franchot Gallo M.D.   On: 01/16/2014 20:36   Mr Lumbar Spine Wo Contrast  01/16/2014   CLINICAL DATA:  Right lower extremity pain.  Dialysis patient.  EXAM: MRI LUMBAR SPINE WITHOUT CONTRAST  TECHNIQUE: Multiplanar, multisequence MR imaging of the lumbar spine was performed. No intravenous contrast was administered.  COMPARISON:  None.  FINDINGS: Negative for acute fracture or mass. Normal lumbar alignment. Conus medullaris is normal and terminates at L1-2.  Small gallstones are present.  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  L1-2: Bilateral facet hypertrophy with mild to moderate spinal stenosis  L2-3: Diffuse disc bulging bilateral facet hypertrophy and moderate spinal stenosis. Left foraminal disc protrusion is present  L3-4: Diffuse disc bulging and  bilateral facet hypertrophy. Moderate to severe spinal stenosis. Bilateral foraminal narrowing  L4-5: Disc bulging and spondylosis. Central disc protrusion and moderate facet hypertrophy. Severe spinal stenosis. Lateral recess and foraminal encroachment bilaterally.  L5-S1: Disc bulging and spondylosis. Bilateral facet hypertrophy. Spinal stenosis and foraminal encroachment bilaterally of mild-to-moderate degree.  IMPRESSION: The patient has a congenitally small spinal canal. There is superimposed degenerative change and spinal stenosis at multiple levels. Spinal stenosis most severe at L3-4 and L4-5. Small left foraminal disc protrusion at L2-3.  Small gallstones.   Electronically Signed   By: Franchot Gallo M.D.   On: 01/16/2014 20:41   US Renal  01/16/2014   CLINICAL DATA:  Recurrent UTI  EXAM: RENAL/URINARY TRACT ULTRASOUND COMPLETE  COMPARISON:  None.  FINDINGS: Right Kidney:  Length: 10.8 cm. There is mild increased echogenicity of the renal cortex. No mass or hydronephrosis.  Left Kidney:  Length: 12.2 cm. There is mild increased echogenicity of the renal cortex. No mass or hydronephrosis.  Bladder:  Appears normal for degree of bladder distention.  IMPRESSION: 1. No obstructive uropathy. 2. Mild increased renal cortical echogenicity as can be seen with medical renal disease.   Electronically Signed   By: Kathreen Devoid   On: 01/16/2014 19:18    Medications: Scheduled Meds: . aspirin EC  325 mg Oral Daily  . atorvastatin  30 mg Oral QHS  . folic acid  1 mg Oral Daily   And  . B-complex with vitamin C  1 tablet Oral Daily  . carbidopa-levodopa  0.5 tablet Oral TID  . cefTRIAXone (ROCEPHIN)  IV  1 g Intravenous Q24H  . darbepoetin  40 mcg Intravenous Q Tue-HD  . divalproex  1,500 mg Oral Daily  . docusate sodium  100 mg Oral QHS  . escitalopram  10 mg Oral Daily  . ferric gluconate (FERRLECIT/NULECIT) IV  125 mg Intravenous Q T,Th,Sa-HD  . gabapentin  300 mg Oral QHS  . insulin aspart  0-9  Units Subcutaneous TID WC  . lanthanum  2,000 mg Oral TID WC  . metoprolol tartrate  6.25 mg Oral BID      LOS: 3 days   Retal Tonkinson Krystal Eaton M.D. [facility] 01/18/2014, 11:44 AM Pager: [phone]  If 7PM-7AM, please contact night-coverage [email or web address] Password [email or web address]  **Disclaimer: This note was dictated with voice recognition software. Similar sounding words can inadvertently be transcribed and this note may contain transcription errors which may not have been corrected upon publication  of note.**

## 2014-01-18 NOTE — Progress Notes (Signed)
Physical Therapy Treatment Patient Details Name: [name] [name] MRN: [id number] DOB: 01/19/1941 Today's Date: 01/18/2014    History of Present Illness 73 year old male with a history of ESRD, PVD, L BKA with prosthesis, HTN, Sz d/o, DM, PAF, resected oligoastrocytoma presents with RLE pain and AMS. Pt with UTI and encephalopathy with CT(-)    PT Comments    Pt continues to require assistance for safe mobility and for ADL's. Pt requires incr time for all activities. It took pt  >15 minutes to get his prosthesis and shoes on. Continues to need ST-SNF.  Follow Up Recommendations  SNF     Equipment Recommendations  3in1 (PT)    Recommendations for Other Services OT consult     Precautions / Restrictions Precautions Precautions: Fall Precaution Comments: L BKA with prosthesis    Mobility  Bed Mobility Overal bed mobility: Needs Assistance Bed Mobility: Supine to Sit     Supine to sit: Supervision;HOB elevated     General bed mobility comments: Incr time and use of rail  Transfers Overall transfer level: Needs assistance Equipment used: Rolling walker (2 wheeled) Transfers: Sit to/from Stand Sit to Stand: Min guard         General transfer comment: Pt requires rocking momentum to come to stand. Verbal cues for hand placement for sitting in chair.  Ambulation/Gait Ambulation/Gait assistance: Min guard Ambulation Distance (Feet): 150 Feet Assistive device: Rolling walker (2 wheeled) Gait Pattern/deviations: Step-through pattern;Decreased stride length;Trunk flexed   Gait velocity interpretation: Below normal speed for age/gender General Gait Details: Verbal cues to stand more erect. Pt with heavy pressure on UE's.   Stairs            Wheelchair Mobility    Modified Rankin (Stroke Patients Only)       Balance Overall balance assessment: Needs assistance Sitting-balance support: No upper extremity supported;Feet supported Sitting balance-Leahy Scale:  Fair     Standing balance support: Bilateral upper extremity supported Standing balance-Leahy Scale: Poor                      Cognition Arousal/Alertness: Awake/alert Behavior During Therapy: Flat affect Overall Cognitive Status: Impaired/Different from baseline     Current Attention Level: Sustained Memory: Decreased short-term memory Following Commands: Follows one step commands with increased time Safety/Judgement: Decreased awareness of deficits   Problem Solving: Slow processing;Requires verbal cues      Exercises      General Comments        Pertinent Vitals/Pain Pt reports chronic back pain.    Home Living                      Prior Function            PT Goals (current goals can now be found in the care plan section) Progress towards PT goals: Progressing toward goals    Frequency  Min 3X/week    PT Plan Current plan remains appropriate    Co-evaluation             End of Session Equipment Utilized During Treatment: Gait belt;Other (comment) (LLE prosthesis) Activity Tolerance: Patient tolerated treatment well Patient left: in chair;with chair alarm set;with call bell/phone within reach;with family/visitor present     Time: 1134-1208 PT Time Calculation (min): 34 min  Charges:  $Gait Training: 23-37 mins                    G Codes:  Shary Decamp Chloe Miyoshi 01/18/2014, 12:23 PM  [employer] PT [phone]

## 2014-01-19 LAB — RENAL FUNCTION PANEL
Albumin: 2.4 g/dL — ABNORMAL LOW (ref 3.5–5.2)
BUN: 65 mg/dL — ABNORMAL HIGH (ref 6–23)
CALCIUM: 9.3 mg/dL (ref 8.4–10.5)
CO2: 24 meq/L (ref 19–32)
Chloride: 91 mEq/L — ABNORMAL LOW (ref 96–112)
Creatinine, Ser: 10.18 mg/dL — ABNORMAL HIGH (ref 0.50–1.35)
GFR, EST AFRICAN AMERICAN: 5 mL/min — AB (ref >90)
GFR, EST NON AFRICAN AMERICAN: 4 mL/min — AB (ref >90)
Glucose, Bld: 103 mg/dL — ABNORMAL HIGH (ref 70–99)
Phosphorus: 5.7 mg/dL — ABNORMAL HIGH (ref 2.3–4.6)
Potassium: 5.1 mEq/L (ref 3.7–5.3)
SODIUM: 134 meq/L — AB (ref 137–147)

## 2014-01-19 LAB — CBC
HCT: 28.9 % — ABNORMAL LOW (ref 39.0–52.0)
Hemoglobin: 9.5 g/dL — ABNORMAL LOW (ref 13.0–17.0)
MCH: 31.1 pg (ref 26.0–34.0)
MCHC: 32.9 g/dL (ref 30.0–36.0)
MCV: 94.8 fL (ref 78.0–100.0)
PLATELETS: 317 10*3/uL (ref 150–400)
RBC: 3.05 MIL/uL — AB (ref 4.22–5.81)
RDW: 14.5 % (ref 11.5–15.5)
WBC: 8.7 10*3/uL (ref 4.0–10.5)

## 2014-01-19 LAB — GLUCOSE, CAPILLARY
Glucose-Capillary: 94 mg/dL (ref 70–99)
Glucose-Capillary: 85 mg/dL (ref 70–99)
Glucose-Capillary: 149 mg/dL — ABNORMAL HIGH (ref 70–99)
Glucose-Capillary: 150 mg/dL — ABNORMAL HIGH (ref 70–99)

## 2014-01-19 MED ORDER — LIDOCAINE HCL (PF) 1 % IJ SOLN: 5.0000 mL | INTRAMUSCULAR | Status: DC | PRN

## 2014-01-19 MED ORDER — HEPARIN SODIUM (PORCINE) 1000 UNIT/ML DIALYSIS
1000.0000 [IU] | INTRAMUSCULAR | Status: DC | PRN
Start: 2014-01-19 — End: 2014-01-19

## 2014-01-19 MED ORDER — PENTAFLUOROPROP-TETRAFLUOROETH EX AERO: 1.0000 "application " | INHALATION_SPRAY | CUTANEOUS | Status: DC | PRN

## 2014-01-19 MED ORDER — SODIUM CHLORIDE 0.9 % IV SOLN: 100.0000 mL | INTRAVENOUS | Status: DC | PRN

## 2014-01-19 MED ORDER — NEPRO/CARBSTEADY PO LIQD
237.0000 mL | ORAL | Status: DC | PRN
  Filled 2014-01-19: qty 237

## 2014-01-19 MED ORDER — HEPARIN SODIUM (PORCINE) 1000 UNIT/ML DIALYSIS: 8000.0000 [IU] | Freq: Once | INTRAMUSCULAR | Status: DC

## 2014-01-19 MED ORDER — LIDOCAINE-PRILOCAINE 2.5-2.5 % EX CREA
1.0000 "application " | TOPICAL_CREAM | CUTANEOUS | Status: DC | PRN
  Filled 2014-01-19: qty 5

## 2014-01-19 MED ORDER — ALTEPLASE 2 MG IJ SOLR
2.0000 mg | Freq: Once | INTRAMUSCULAR | Status: DC | PRN
  Filled 2014-01-19: qty 2

## 2014-01-19 NOTE — Progress Notes (Signed)
Subjective:   Doing well. Tolerating HD, no complaints  Objective Filed Vitals:   01/19/14 1130 01/19/14 1200 01/19/14 1230 01/19/14 1257  BP: 102/52 99/53 96/52  105/55  Pulse: 78 78 80 82  Temp:    98.1 F (36.7 C)  TempSrc:    Oral  Resp: 18 18 18 18   Height:      Weight:    115.5 kg (254 lb 10.1 oz)  SpO2:       Physical Exam General: alert and oriented. No acute distress. Slept through most of HD Heart: RRR Lungs: CTA, unlabored Abdomen: soft nontender +BS Extremities: no RLE edema. L BKA Dialysis Access: L AVF patent on HD  D[facility]  4.5h 117.5kg 2/2.0 [place] Heparin 8000 LUA AVF  EPO 5000 Venofer 100/hd thru 5/19   Assessment/Plan:  1. AMS- workup per primary. EEG negative. MRI negative for stroke  2. UTI- + for Ecoli. on rocephin, urology consulted  3. Back pain- for lumbar epidural steriod injection 5/8- MRI reveals spinal stenosis- L 4-5 3. ESRD - TTS @ [facility]. HD today, uf goal 3600 4. Anemia - hgb 9.5, aranesp q Tuesday. Cont Fe q HD  5. Secondary hyperparathyroidism - Ca+9.3 fosrenol  6. HTN/volume - 105/55. Cont metop, no volume excess  7. Nutrition - alb 2.4, renal/carb motified diet, nepro, multi vit  8. DM- per primary 9. Debility- came from ALF, more deconditioned now   Shelle Iron, NP [place] [phone] 01/19/2014,1:29 PM  LOS: 4 days   Pt seen, examined, agree w assess/plan as above with additions as indicated.  Kelly Splinter MD pager [phone]    cell [phone] 01/19/2014, 2:08 PM        Additional Objective Labs: Basic Metabolic Panel:  Recent Labs Lab 01/16/14 0405 01/17/14 0640 01/19/14 0916  NA 138 136* 134*  K 5.3 5.5* 5.1  CL 93* 91* 91*  CO2 24 22 24   GLUCOSE 76 143* 103*  BUN 75* 95* 65*  CREATININE 12.94* 14.29* 10.18*  CALCIUM 9.7 9.7 9.3  PHOS  --   --  5.7*   Liver Function Tests:  Recent Labs Lab 01/15/14 1717 01/16/14 0405 01/19/14 0916  AST 16 17  --   ALT <5 <5  --    ALKPHOS 56 56  --   BILITOT 0.3 0.2*  --   PROT 8.0 7.1  --   ALBUMIN 2.9* 2.6* 2.4*   No results found for this basename: LIPASE, AMYLASE,  in the last 168 hours CBC:  Recent Labs Lab 01/15/14 1358 01/16/14 0405 01/17/14 0640 01/19/14 0916  WBC 12.7* 10.4 10.9* 8.7  NEUTROABS  --  5.5  --   --   HGB 10.5* 9.5* 10.1* 9.5*  HCT 31.6* 29.1* 30.0* 28.9*  MCV 96.6 96.4 94.3 94.8  PLT 353 323 318 317   Blood Culture    Component Value Date/Time   SDES URINE, CLEAN CATCH 01/15/2014 [place] 01/15/2014 1808   CULT  Value: ESCHERICHIA COLI Performed at [facility] 01/15/2014 1808   REPTSTATUS 01/17/2014 FINAL 01/15/2014 1808    Cardiac Enzymes: No results found for this basename: CKTOTAL, CKMB, CKMBINDEX, TROPONINI,  in the last 168 hours CBG:  Recent Labs Lab 01/18/14 0728 01/18/14 1116 01/18/14 1622 01/18/14 2115 01/19/14 0736  GLUCAP 92 115* 136* 151* 94   Iron Studies: No results found for this basename: IRON, TIBC, TRANSFERRIN, FERRITIN,  in the last 72 hours @lablastinr3 @ Studies/Results: No results found.  Medications:   . aspirin EC  325 mg Oral Daily  . atorvastatin  30 mg Oral QHS  . folic acid  1 mg Oral Daily   And  . B-complex with vitamin C  1 tablet Oral Daily  . carbidopa-levodopa  0.5 tablet Oral TID  . cefTRIAXone (ROCEPHIN)  IV  1 g Intravenous Q24H  . darbepoetin  40 mcg Intravenous Q Tue-HD  . divalproex  1,500 mg Oral Daily  . docusate sodium  100 mg Oral QHS  . escitalopram  10 mg Oral Daily  . feeding supplement (NEPRO CARB STEADY)  237 mL Oral BID BM  . ferric gluconate (FERRLECIT/NULECIT) IV  125 mg Intravenous Q T,Th,Sa-HD  . gabapentin  300 mg Oral QHS  . heparin  8,000 Units Dialysis Once in dialysis  . insulin aspart  0-9 Units Subcutaneous TID WC  . lanthanum  2,000 mg Oral TID WC  . metoprolol tartrate  6.25 mg Oral BID

## 2014-01-19 NOTE — Progress Notes (Signed)
Patient ID: [name] [name]  male  QJJ:[id number]    DOB: Nov 13, 1940    DOA: 01/15/2014  PCP: Donetta Potts, MD  Assessment/Plan: Principal Problem:   Acute encephalopathy possibly due to the UTI, medication, improving confirmed by his wife at the bedside -  EEG negative for epileptic disorder  - MRI brain negative for stroke.  - Ammonia normal at 30. Valproic acid at 54.  - Hold percocet. resume gabapentin.  - Continue Rocephin  Active Problems:   ESRD (end stage renal disease) on dialysis: TTS - Renal following, hemodialysis tomorrow  Right lower extremity pain - differentials include osteoarthritis of the right hip but patient's pain radiates all the way to the ankle so MRI of the L-spine ordered.  - Doppler: There is no DVT or SVT noted in the right lower extremity. - ABI; unable to ascertain ABI secondary to calcified vessels -  MRI of lumbar spine showed small left foraminal disc protrusion at L2-3, and spinal stenosis at multiple levels.  -Neurosurgery was consulted, seen by Dr. 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  - Continue pain control, cortisone injection tomorrow by interventional radiology  Escherichia coli UTI (lower urinary tract infection) - Continue with Ceftriaxone. Urine growing E coli. Dr Junious Silk with urology recommended out patient cystoscopy  Seizure disorder - Continue Depakote   DVT Prophylaxis:  Code Status:  Family Communication: Discussed in detail with patient's wife at the bedside. Patient is currently resident of ALF, patient's wife reports that he is lot more deconditioned than he actually he is.    Disposition: Hopefully next 24-48 hours, ESI tomorrow  Consultants:  Neurosurgeon   Urology  Nephrology  Procedures:  EEG  Antibiotics:  IV rocephin     Subjective:  Patient seen and examined in  hemodialysis, mental status improved Objective: Weight change: -1.5 kg (-3 lb 4.9 oz)  Intake/Output Summary (Last 24 hours) at 01/19/14 1255 Last data filed at 01/18/14 2317  Gross per 24 hour  Intake    630 ml  Output      0 ml  Net    630 ml   Blood pressure 96/52, pulse 80, temperature 97.3 F (36.3 C), temperature source Oral, resp. rate 18, height 6\' 4"  (1.93 m), weight 118.3 kg (260 lb 12.9 oz), SpO2 97.00%.  Physical Exam: General: Alert and awake, oriented x3, not in any acute distress. CVS: S1-S2 clear, no murmur rubs or gallops Chest: clear to auscultation bilaterally, no wheezing, rales or rhonchi Abdomen: soft nontender, nondistended, normal bowel sounds  Extremities left BKA   Lab Results: Basic Metabolic Panel:  Recent Labs Lab 01/17/14 0640 01/19/14 0916  NA 136* 134*  K 5.5* 5.1  CL 91* 91*  CO2 22 24  GLUCOSE 143* 103*  BUN 95* 65*  CREATININE 14.29* 10.18*  CALCIUM 9.7 9.3  PHOS  --  5.7*   Liver Function Tests:  Recent Labs Lab 01/15/14 1717 01/16/14 0405 01/19/14 0916  AST 16 17  --   ALT <5 <5  --   ALKPHOS 56 56  --   BILITOT 0.3 0.2*  --   PROT 8.0 7.1  --   ALBUMIN 2.9* 2.6* 2.4*   No results found for this basename: LIPASE, AMYLASE,  in the last 168 hours  Recent Labs Lab 01/15/14 1717  AMMONIA 30   CBC:  Recent Labs Lab 01/16/14 0405  01/17/14 0640 01/19/14 0916  WBC 10.4 10.9* 8.7  NEUTROABS 5.5  --   --   HGB 9.5* 10.1* 9.5*  HCT 29.1* 30.0* 28.9*  MCV 96.4 94.3 94.8  PLT 323 318 317   Cardiac Enzymes: No results found for this basename: CKTOTAL, CKMB, CKMBINDEX, TROPONINI,  in the last 168 hours BNP: No components found with this basename: POCBNP,  CBG:  Recent Labs Lab 01/18/14 0728 01/18/14 1116 01/18/14 1622 01/18/14 2115 01/19/14 0736  GLUCAP 92 115* 136* 151* 94     Micro Results: Recent Results (from the past 240 hour(s))  URINE CULTURE     Status: None   Collection Time    01/15/14  6:08  PM      Result Value Ref Range Status   Specimen Description URINE, CLEAN CATCH   Final   Special Requests ADDED 1958   Final   Culture  Setup Time     Final   Value: 01/15/2014 20:37     Performed at [place]     Final   Value: >=100,000 COLONIES/ML     Performed at [facility]   Culture     Final   Value: ESCHERICHIA COLI     Performed at [facility]   Report Status 01/17/2014 FINAL   Final   Organism ID, Bacteria ESCHERICHIA COLI   Final    Studies/Results: Dg Chest 2 View  01/15/2014   CLINICAL DATA:  Leg swelling.  Lethargy.  EXAM: CHEST  2 VIEW  COMPARISON:  09/30/2013.  FINDINGS: Poor inspiration. Mild bibasilar atelectasis. Grossly normal sized heart. Thoracic spine degenerative changes.  IMPRESSION: Poor inspiration with mild bibasilar atelectasis.   Electronically Signed   By: Enrique Sack M.D.   On: 01/15/2014 14:36   Dg Hip Complete Right  01/13/2014   CLINICAL DATA:  HIP PAIN  EXAM: RIGHT HIP - COMPLETE 2+ VIEW  COMPARISON:  None.  FINDINGS: There is no evidence of hip fracture or dislocation. Areas of hypertrophic spurring and periarticular sclerosis involving the femoral head and acetabulum. Atherosclerotic calcifications are appreciated. Soft tissues otherwise unremarkable. There is scattered areas of retained contrast within the colon greatest area of confluence in the rectosigmoid region.  IMPRESSION: Osteoarthritic changes without acute osseous abnormalities.   Electronically Signed   By: Margaree Mackintosh M.D.   On: 01/13/2014 12:21   Ct Head Wo Contrast  01/15/2014   CLINICAL DATA:  Head pain  EXAM: CT HEAD WITHOUT CONTRAST  TECHNIQUE: Contiguous axial images were obtained from the base of the skull through the vertex without intravenous contrast.  COMPARISON:  Brain MRI, 12/18/2013 Hand head CT, 09/08/13.  FINDINGS: Ventricles are normal in configuration. There is ventricular and sulcal enlargement reflecting mild to moderate  atrophy  No parenchymal masses or mass effect. There is no evidence of a recent infarct. Encephalomalacia along the right temporal lobe is stable due to a previous right frontotemporal craniotomy.  No extra-axial masses or abnormal fluid collections.  There is no intracranial hemorrhage right craniotomy changes are stable. Minimal maxillary sinus mucosal thickening. Remaining sinuses and mastoid air cells are clear.  IMPRESSION: 1. No acute intracranial abnormalities. No change from the prior study.   Electronically Signed   By: Lajean Manes M.D.   On: 01/15/2014 19:10   Mr Brain Wo Contrast  01/16/2014   CLINICAL DATA:  Stroke  EXAM: MRI HEAD WITHOUT CONTRAST  TECHNIQUE: Multiplanar, multiecho pulse sequences of the brain and  surrounding structures were obtained without intravenous contrast.  COMPARISON:  MRI 12/18/2013.  CT 01/15/2014  FINDINGS: Prior right temporal craniotomy. Encephalomalacia right temporal lobe is stable.  Mild atrophy.  Negative for hydrocephalus.  Negative for acute infarct.  Negative for mass lesion.  IMPRESSION: No acute abnormality.   Electronically Signed   By: Franchot Gallo M.D.   On: 01/16/2014 20:36   Mr Lumbar Spine Wo Contrast  01/16/2014   CLINICAL DATA:  Right lower extremity pain.  Dialysis patient.  EXAM: MRI LUMBAR SPINE WITHOUT CONTRAST  TECHNIQUE: Multiplanar, multisequence MR imaging of the lumbar spine was performed. No intravenous contrast was administered.  COMPARISON:  None.  FINDINGS: Negative for acute fracture or mass. Normal lumbar alignment. Conus medullaris is normal and terminates at L1-2.  Small gallstones are present.  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  L1-2: Bilateral facet hypertrophy with mild to moderate spinal stenosis  L2-3: Diffuse disc bulging bilateral facet hypertrophy and moderate spinal stenosis. Left foraminal disc protrusion is present  L3-4: Diffuse disc bulging and bilateral facet hypertrophy. Moderate to  severe spinal stenosis. Bilateral foraminal narrowing  L4-5: Disc bulging and spondylosis. Central disc protrusion and moderate facet hypertrophy. Severe spinal stenosis. Lateral recess and foraminal encroachment bilaterally.  L5-S1: Disc bulging and spondylosis. Bilateral facet hypertrophy. Spinal stenosis and foraminal encroachment bilaterally of mild-to-moderate degree.  IMPRESSION: The patient has a congenitally small spinal canal. There is superimposed degenerative change and spinal stenosis at multiple levels. Spinal stenosis most severe at L3-4 and L4-5. Small left foraminal disc protrusion at L2-3.  Small gallstones.   Electronically Signed   By: Franchot Gallo M.D.   On: 01/16/2014 20:41   US Renal  01/16/2014   CLINICAL DATA:  Recurrent UTI  EXAM: RENAL/URINARY TRACT ULTRASOUND COMPLETE  COMPARISON:  None.  FINDINGS: Right Kidney:  Length: 10.8 cm. There is mild increased echogenicity of the renal cortex. No mass or hydronephrosis.  Left Kidney:  Length: 12.2 cm. There is mild increased echogenicity of the renal cortex. No mass or hydronephrosis.  Bladder:  Appears normal for degree of bladder distention.  IMPRESSION: 1. No obstructive uropathy. 2. Mild increased renal cortical echogenicity as can be seen with medical renal disease.   Electronically Signed   By: Kathreen Devoid   On: 01/16/2014 19:18    Medications: Scheduled Meds: . aspirin EC  325 mg Oral Daily  . atorvastatin  30 mg Oral QHS  . folic acid  1 mg Oral Daily   And  . B-complex with vitamin C  1 tablet Oral Daily  . carbidopa-levodopa  0.5 tablet Oral TID  . cefTRIAXone (ROCEPHIN)  IV  1 g Intravenous Q24H  . darbepoetin  40 mcg Intravenous Q Tue-HD  . divalproex  1,500 mg Oral Daily  . docusate sodium  100 mg Oral QHS  . escitalopram  10 mg Oral Daily  . feeding supplement (NEPRO CARB STEADY)  237 mL Oral BID BM  . ferric gluconate (FERRLECIT/NULECIT) IV  125 mg Intravenous Q T,Th,Sa-HD  . gabapentin  300 mg Oral QHS  .  heparin  8,000 Units Dialysis Once in dialysis  . insulin aspart  0-9 Units Subcutaneous TID WC  . lanthanum  2,000 mg Oral TID WC  . metoprolol tartrate  6.25 mg Oral BID      LOS: 4 days   Yaniel Limbaugh Krystal Eaton M.D. [facility] 01/19/2014, 12:55 PM Pager: [phone]  If 7PM-7AM, please contact night-coverage [email or web address] Password  [email or web address]  **Disclaimer: This note was dictated with voice recognition software. Similar sounding words can inadvertently be transcribed and this note may contain transcription errors which may not have been corrected upon publication of note.**

## 2014-01-19 NOTE — H&P (Signed)
[name] [name] is an 73 y.o. male.   Chief Complaint: Pt has has 1 yr hx of low back pain Has always used heat pad/Tylenol with good relief Worsening pain not relieved with OTC meds Pt has been admitted to hospital for evaluation MRI reveals spinal stenosis L4-5 area especially Pt has been evaluated by neurosurgery Dr Ellene Route Request for Lumbar Epidural steroid injection Pt in dialysis now Has received heparin during 4 hr session--discussed with Dr Jola Baptist Will prepare for Epi inj to be performed 5/8  HPI: ESRD; PVD- L BKA; HTN; HLD; afib; sz disorder; DM; CHF; CAD  Past Medical History  Diagnosis Date  . ESRD on hemodialysis     Started HD around 2009, maybe longer.  Was living in [place], [place] then.  Moved to [place] in 2014 and gets HD now at [facility] on [address] on a TTS schedule.  ESRD was due to DM and HTN.     Marland Kitchen Peripheral vascular disease   . Hypertension   . Hyperlipidemia   . Arthritis     Gout  . Paroxysmal atrial fibrillation   . Brain tumor 2009  . Seizure disorder 2009  . Anemia   . Hyperparathyroidism, secondary   . Diabetes mellitus without complication     Type II, diet controlled  . Proteus mirabilis infection   . CHF (congestive heart failure)     grade 1 DD, preserved EF, [facility] records  . 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 brain tumor  . GERD (gastroesophageal reflux disease)   . Hx of cardiac cath     a. LHC (9/14):  Normal cors, EF 65%.     Past Surgical History  Procedure Laterality Date  . Av fistula placement Left     arm  . 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 Eye surgery Bilateral 2000    cataract  . Retinopathy surgery      Hx. of laser treatments for diabetics    Family History  Problem Relation Age of Onset  . Seizures Mother   . Stroke Mother 33  . Hypertension Mother   . Alcohol abuse Father   . Hypertension  Father   . Arthritis Sister   . Heart attack Neg Hx   . Heart failure Neg Hx    Social History:  reports that he has never smoked. He has never used smokeless tobacco. He reports that he drinks about .6 ounces of alcohol per week. He reports that he does not use illicit drugs.  Allergies: No Known Allergies  Medications Prior to Admission  Medication Sig Dispense Refill  . acetaminophen (TYLENOL) 325 MG tablet Take 325 mg by mouth every 6 (six) hours as needed for moderate pain or fever.      Marland Kitchen aspirin EC 325 MG tablet Take 325 mg by mouth daily.      Marland Kitchen atorvastatin (LIPITOR) 20 MG tablet Take 30 mg by mouth at bedtime.      . B Complex-C-Folic Acid (RENO CAPS) 1 MG CAPS Take 1 mg by mouth every evening. 8pm      . carbidopa-levodopa (SINEMET IR) 25-100 MG per tablet Take 0.5 tablets by mouth 3 (three) times daily.      . divalproex (DEPAKOTE) 500 MG DR tablet Take 1,500 mg by mouth daily.      Marland Kitchen docusate sodium (COLACE) 100 MG capsule Take 100  mg by mouth at bedtime.       Marland Kitchen escitalopram (LEXAPRO) 10 MG tablet Take 10 mg by mouth daily.      Marland Kitchen gabapentin (NEURONTIN) 300 MG capsule Take 300 mg by mouth at bedtime.      Marland Kitchen lanthanum (FOSRENOL) 1000 MG chewable tablet Chew 2,000 mg by mouth 3 (three) times daily with meals.      . metoprolol tartrate (LOPRESSOR) 25 MG tablet Take 6.25 mg by mouth 2 (two) times daily. Take 6.80m by mouth daily      . nitroGLYCERIN (NITROSTAT) 0.4 MG SL tablet Place 1 tablet (0.4 mg total) under the tongue every 5 (five) minutes x 3 doses as needed for chest pain.  30 tablet  12  . omeprazole (PRILOSEC OTC) 20 MG tablet Take 20 mg by mouth daily as needed (heartburn).       .Marland KitchenoxyCODONE-acetaminophen (PERCOCET/ROXICET) 5-325 MG per tablet Take 1-2 tablets by mouth every 6 (six) hours as needed for severe pain.  10 tablet  0  . simethicone (MYLICON) 80 MG chewable tablet Chew 80 mg by mouth 2 (two) times daily as needed for flatulence.        Results for orders  placed during the hospital encounter of 01/15/14 (from the past 48 hour(s))  GLUCOSE, CAPILLARY     Status: Abnormal   Collection Time    01/17/14  4:43 PM      Result Value Ref Range   Glucose-Capillary 167 (*) 70 - 99 mg/dL  GLUCOSE, CAPILLARY     Status: Abnormal   Collection Time    01/17/14  9:07 PM      Result Value Ref Range   Glucose-Capillary 132 (*) 70 - 99 mg/dL  GLUCOSE, CAPILLARY     Status: None   Collection Time    01/18/14  7:28 AM      Result Value Ref Range   Glucose-Capillary 92  70 - 99 mg/dL  GLUCOSE, CAPILLARY     Status: Abnormal   Collection Time    01/18/14 11:16 AM      Result Value Ref Range   Glucose-Capillary 115 (*) 70 - 99 mg/dL  GLUCOSE, CAPILLARY     Status: Abnormal   Collection Time    01/18/14  4:22 PM      Result Value Ref Range   Glucose-Capillary 136 (*) 70 - 99 mg/dL   Comment 1 Notify RN    GLUCOSE, CAPILLARY     Status: Abnormal   Collection Time    01/18/14  9:15 PM      Result Value Ref Range   Glucose-Capillary 151 (*) 70 - 99 mg/dL  GLUCOSE, CAPILLARY     Status: None   Collection Time    01/19/14  7:36 AM      Result Value Ref Range   Glucose-Capillary 94  70 - 99 mg/dL  CBC     Status: Abnormal   Collection Time    01/19/14  9:16 AM      Result Value Ref Range   WBC 8.7  4.0 - 10.5 K/uL   RBC 3.05 (*) 4.22 - 5.81 MIL/uL   Hemoglobin 9.5 (*) 13.0 - 17.0 g/dL   HCT 28.9 (*) 39.0 - 52.0 %   MCV 94.8  78.0 - 100.0 fL   MCH 31.1  26.0 - 34.0 pg   MCHC 32.9  30.0 - 36.0 g/dL   RDW 14.5  11.5 - 15.5 %   Platelets 317  150 -  400 K/uL  RENAL FUNCTION PANEL     Status: Abnormal   Collection Time    01/19/14  9:16 AM      Result Value Ref Range   Sodium 134 (*) 137 - 147 mEq/L   Potassium 5.1  3.7 - 5.3 mEq/L   Chloride 91 (*) 96 - 112 mEq/L   CO2 24  19 - 32 mEq/L   Glucose, Bld 103 (*) 70 - 99 mg/dL   BUN 65 (*) 6 - 23 mg/dL   Creatinine, Ser 10.18 (*) 0.50 - 1.35 mg/dL   Calcium 9.3  8.4 - 10.5 mg/dL   Phosphorus  5.7 (*) 2.3 - 4.6 mg/dL   Albumin 2.4 (*) 3.5 - 5.2 g/dL   GFR calc non Af Amer 4 (*) >90 mL/min   GFR calc Af Amer 5 (*) >90 mL/min   Comment: (NOTE)     The eGFR has been calculated using the CKD EPI equation.     This calculation has not been validated in all clinical situations.     eGFR's persistently <90 mL/min signify possible Chronic Kidney     Disease.   No results found.  Review of Systems  Constitutional: Negative for fever.  Respiratory: Negative for cough and shortness of breath.   Musculoskeletal: Positive for back pain.       Rt buttock and hip pain  Neurological: Negative for weakness.    Blood pressure 107/59, pulse 73, temperature 97.3 F (36.3 C), temperature source Oral, resp. rate 18, height 6' 4"  (1.93 m), weight 118.3 kg (260 lb 12.9 oz), SpO2 97.00%. Physical Exam  Constitutional: He is oriented to person, place, and time. He appears well-developed and well-nourished.  Cardiovascular: Normal rate and regular rhythm.   No murmur heard. Respiratory: Effort normal. He has no wheezes.  GI: Soft. Bowel sounds are normal. There is no tenderness.  Musculoskeletal: Normal range of motion.  Left BKA Rt back; buttock and hip pain  Neurological: He is alert and oriented to person, place, and time.  Skin: Skin is warm and dry.  Psychiatric: He has a normal mood and affect. His behavior is normal. Judgment and thought content normal.     Assessment/Plan Low back pain; Rt buttock and hip pain Worsening x several days MRI reveals spinal stenosis- L 4-5 Scheduled for L ESI 5/8 (had heparin during dialysis today) Pt aware of procedure benefits and risks and agreeable to proceed Consent signed and in chart  Lavonia Drafts 01/19/2014, 10:03 AM

## 2014-01-20 ENCOUNTER — Inpatient Hospital Stay (HOSPITAL_COMMUNITY): Payer: Medicare Other

## 2014-01-20 LAB — GLUCOSE, CAPILLARY
Glucose-Capillary: 116 mg/dL — ABNORMAL HIGH (ref 70–99)
Glucose-Capillary: 167 mg/dL — ABNORMAL HIGH (ref 70–99)
Glucose-Capillary: 106 mg/dL — ABNORMAL HIGH (ref 70–99)
Glucose-Capillary: 177 mg/dL — ABNORMAL HIGH (ref 70–99)

## 2014-01-20 MED ORDER — IOHEXOL 180 MG/ML  SOLN
20.0000 mL | Freq: Once | INTRAMUSCULAR | Status: AC | PRN
  Administered 2014-01-20: 3 mL via EPIDURAL

## 2014-01-20 MED ORDER — METHYLPREDNISOLONE ACETATE 80 MG/ML IJ SUSP
INTRAMUSCULAR | Status: AC
Start: 2014-01-20 — End: 2014-01-20
  Administered 2014-01-20: 80 mg
  Filled 2014-01-20: qty 1

## 2014-01-20 MED ORDER — METHYLPREDNISOLONE ACETATE 40 MG/ML IJ SUSP
INTRAMUSCULAR | Status: AC
  Administered 2014-01-20: 40 mg
  Filled 2014-01-20: qty 5

## 2014-01-20 NOTE — Progress Notes (Signed)
Patient ID: [name] [name], male   DOB: 1940-09-17, 73 y.o.   MRN: [id number]  Pt feeling better. Looks better. More alert today. He doesn't recall having dysuria, frequency, urgency, bladder pain with "UTI". No gross hematuria. Urine darker.   Filed Vitals:   01/20/14 0437  BP: 121/66  Pulse: 74  Temp: 98.4 F (36.9 C)  Resp: 16    PE: Resting in bed Abd - soft, NT  Imp / plan - EDRD, HD, UTI - -on rocephin -it's possible he's had asymptomatic bacteriuria (no dysuria, frequency, urgency, bladder pain, fever, etc.) which does not need treatment. However, when a patient has MS changes, it's certainly more difficult to tell if it is from a true UTI and I agree abx are indicated.  -I'll see him in office for PVR check and cystoscopy.

## 2014-01-20 NOTE — Progress Notes (Signed)
Pt was awaken for sleep to give meds and he c/o  Left arm that he was unable to move it like before . Noted a drift when asked to move his arm. Dr. Tana Coast was called and she will be getting a MRI now  No other signs  noted

## 2014-01-20 NOTE — Progress Notes (Signed)
PT Cancellation Note  Patient Details Name: [name] [name] MRN: [id number] DOB: 20-Sep-1940   Cancelled Treatment:    Reason Eval/Treat Not Completed: Pain limiting ability to participate. Patient with increase back pain and hip pain. Had epidural earlier and refused OOB this afternoon   [place] 01/20/2014, 2:10 PM

## 2014-01-20 NOTE — Progress Notes (Addendum)
CSW (Clinical Education officer, museum) called facility to inquire about weekend discharge. Awaiting return phone call to confirm.  ADDENDUM 1:10pm: CSW spoke with Abigail Butts and Clarene Critchley at facility. They both confirmed pt can return over the weekend. Weekend CSW will need to fax updated FL2 and dc summary to [facility] at [phone]. Facility not requiring any updates today.   [place], [place]

## 2014-01-20 NOTE — Progress Notes (Signed)
Additional Medicare IM given to pt and signed copy placed in shadow chart 

## 2014-01-20 NOTE — Procedures (Signed)
Lumbar L4-5 epidural injection 120mg  depomedrol, lido 1% No complication No blood loss. See complete dictation in [facility].

## 2014-01-20 NOTE — Progress Notes (Signed)
Patient ID: [name] [name]  male  ZOX:[id number]    DOB: 1941-01-25    DOA: 01/15/2014  PCP: Donetta Potts, MD  Assessment/Plan: Principal Problem:   Acute encephalopathy possibly due to the UTI, medication, improving confirmed by his wife at the bedside - EEG negative for epileptic disorder  - MRI brain negative for stroke.  - Ammonia normal at 30. Valproic acid at 54.  - Hold percocet. resume gabapentin.  - Continue Rocephin  Active Problems:   ESRD (end stage renal disease) on dialysis: TTS - Renal following, hemodialysis tomorrow, will request renal for HD first shift for dispositin  Right lower extremity pain - differentials include osteoarthritis of the right hip but patient's pain radiates all the way to the ankle so MRI of the L-spine ordered.  - Doppler: There is no DVT or SVT noted in the right lower extremity. - ABI; unable to ascertain ABI secondary to calcified vessels -  MRI of lumbar spine showed small left foraminal disc protrusion at L2-3, and spinal stenosis at multiple levels.  -Neurosurgery was consulted, seen by Dr. 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  - Continue pain control, cortisone injection today by interventional radiology  Escherichia coli UTI (lower urinary tract infection) - Continue with Ceftriaxone. Urine growing E coli. Dr Junious Silk with urology recommended out patient cystoscopy  Seizure disorder - Continue Depakote   DVT Prophylaxis:  Code Status:  Family Communication:   Disposition: Hopefully tomorrow after HD to ALF  Consultants:  Neurosurgeon   Urology  Nephrology  Intervention radiology  Procedures:  EEG  Epidural steroid injection 01/20/14  Antibiotics:  IV rocephin     Subjective:  Patient seen and examined,mental status resolved, currently at baseline  Objective: Weight change:  -0.1 kg (-3.5 oz)  Intake/Output Summary (Last 24 hours) at 01/20/14 1046 Last data filed at 01/20/14 0858  Gross per 24 hour  Intake    360 ml  Output   3250 ml  Net  -2890 ml   Blood pressure 121/66, pulse 74, temperature 98.4 F (36.9 C), temperature source Oral, resp. rate 16, height 6\' 4"  (1.93 m), weight 115.4 kg (254 lb 6.6 oz), SpO2 99.00%.  Physical Exam: General: Alert and awake, oriented x3, not in any acute distress. CVS: S1-S2 clear, no murmur rubs or gallops Chest: clear to auscultation bilaterally, no wheezing, rales or rhonchi Abdomen: soft nontender, nondistended, normal bowel sounds  Extremities left BKA   Lab Results: Basic Metabolic Panel:  Recent Labs Lab 01/17/14 0640 01/19/14 0916  NA 136* 134*  K 5.5* 5.1  CL 91* 91*  CO2 22 24  GLUCOSE 143* 103*  BUN 95* 65*  CREATININE 14.29* 10.18*  CALCIUM 9.7 9.3  PHOS  --  5.7*   Liver Function Tests:  Recent Labs Lab 01/15/14 1717 01/16/14 0405 01/19/14 0916  AST 16 17  --   ALT <5 <5  --   ALKPHOS 56 56  --   BILITOT 0.3 0.2*  --   PROT 8.0 7.1  --   ALBUMIN 2.9* 2.6* 2.4*   No results found for this basename: LIPASE, AMYLASE,  in the last 168 hours  Recent Labs Lab 01/15/14 1717  AMMONIA 30   CBC:  Recent Labs Lab 01/16/14 0405 01/17/14 0640 01/19/14 0916  WBC 10.4 10.9* 8.7  NEUTROABS 5.5  --   --   HGB  9.5* 10.1* 9.5*  HCT 29.1* 30.0* 28.9*  MCV 96.4 94.3 94.8  PLT 323 318 317   Cardiac Enzymes: No results found for this basename: CKTOTAL, CKMB, CKMBINDEX, TROPONINI,  in the last 168 hours BNP: No components found with this basename: POCBNP,  CBG:  Recent Labs Lab 01/19/14 0736 01/19/14 1400 01/19/14 1651 01/19/14 2007 01/20/14 0744  GLUCAP 94 85 149* 150* 116*     Micro Results: Recent Results (from the past 240 hour(s))  URINE CULTURE     Status: None   Collection Time    01/15/14  6:08 PM      Result Value Ref Range Status   Specimen Description URINE,  CLEAN CATCH   Final   Special Requests ADDED 1958   Final   Culture  Setup Time     Final   Value: 01/15/2014 20:37     Performed at [facility]     Final   Value: >=100,000 COLONIES/ML     Performed at [facility]   Culture     Final   Value: ESCHERICHIA COLI     Performed at [facility]   Report Status 01/17/2014 FINAL   Final   Organism ID, Bacteria ESCHERICHIA COLI   Final    Studies/Results: Dg Chest 2 View  01/15/2014   CLINICAL DATA:  Leg swelling.  Lethargy.  EXAM: CHEST  2 VIEW  COMPARISON:  09/30/2013.  FINDINGS: Poor inspiration. Mild bibasilar atelectasis. Grossly normal sized heart. Thoracic spine degenerative changes.  IMPRESSION: Poor inspiration with mild bibasilar atelectasis.   Electronically Signed   By: Enrique Sack M.D.   On: 01/15/2014 14:36   Dg Hip Complete Right  01/13/2014   CLINICAL DATA:  HIP PAIN  EXAM: RIGHT HIP - COMPLETE 2+ VIEW  COMPARISON:  None.  FINDINGS: There is no evidence of hip fracture or dislocation. Areas of hypertrophic spurring and periarticular sclerosis involving the femoral head and acetabulum. Atherosclerotic calcifications are appreciated. Soft tissues otherwise unremarkable. There is scattered areas of retained contrast within the colon greatest area of confluence in the rectosigmoid region.  IMPRESSION: Osteoarthritic changes without acute osseous abnormalities.   Electronically Signed   By: Margaree Mackintosh M.D.   On: 01/13/2014 12:21   Ct Head Wo Contrast  01/15/2014   CLINICAL DATA:  Head pain  EXAM: CT HEAD WITHOUT CONTRAST  TECHNIQUE: Contiguous axial images were obtained from the base of the skull through the vertex without intravenous contrast.  COMPARISON:  Brain MRI, 12/18/2013 Hand head CT, 09/08/13.  FINDINGS: Ventricles are normal in configuration. There is ventricular and sulcal enlargement reflecting mild to moderate atrophy  No parenchymal masses or mass effect. There is no evidence of a  recent infarct. Encephalomalacia along the right temporal lobe is stable due to a previous right frontotemporal craniotomy.  No extra-axial masses or abnormal fluid collections.  There is no intracranial hemorrhage right craniotomy changes are stable. Minimal maxillary sinus mucosal thickening. Remaining sinuses and mastoid air cells are clear.  IMPRESSION: 1. No acute intracranial abnormalities. No change from the prior study.   Electronically Signed   By: Lajean Manes M.D.   On: 01/15/2014 19:10   Mr Brain Wo Contrast  01/16/2014   CLINICAL DATA:  Stroke  EXAM: MRI HEAD WITHOUT CONTRAST  TECHNIQUE: Multiplanar, multiecho pulse sequences of the brain and surrounding structures were obtained without intravenous contrast.  COMPARISON:  MRI 12/18/2013.  CT 01/15/2014  FINDINGS: Prior right temporal  craniotomy. Encephalomalacia right temporal lobe is stable.  Mild atrophy.  Negative for hydrocephalus.  Negative for acute infarct.  Negative for mass lesion.  IMPRESSION: No acute abnormality.   Electronically Signed   By: Franchot Gallo M.D.   On: 01/16/2014 20:36   Mr Lumbar Spine Wo Contrast  01/16/2014   CLINICAL DATA:  Right lower extremity pain.  Dialysis patient.  EXAM: MRI LUMBAR SPINE WITHOUT CONTRAST  TECHNIQUE: Multiplanar, multisequence MR imaging of the lumbar spine was performed. No intravenous contrast was administered.  COMPARISON:  None.  FINDINGS: Negative for acute fracture or mass. Normal lumbar alignment. Conus medullaris is normal and terminates at L1-2.  Small gallstones are present.  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  L1-2: Bilateral facet hypertrophy with mild to moderate spinal stenosis  L2-3: Diffuse disc bulging bilateral facet hypertrophy and moderate spinal stenosis. Left foraminal disc protrusion is present  L3-4: Diffuse disc bulging and bilateral facet hypertrophy. Moderate to severe spinal stenosis. Bilateral foraminal narrowing  L4-5: Disc bulging  and spondylosis. Central disc protrusion and moderate facet hypertrophy. Severe spinal stenosis. Lateral recess and foraminal encroachment bilaterally.  L5-S1: Disc bulging and spondylosis. Bilateral facet hypertrophy. Spinal stenosis and foraminal encroachment bilaterally of mild-to-moderate degree.  IMPRESSION: The patient has a congenitally small spinal canal. There is superimposed degenerative change and spinal stenosis at multiple levels. Spinal stenosis most severe at L3-4 and L4-5. Small left foraminal disc protrusion at L2-3.  Small gallstones.   Electronically Signed   By: Franchot Gallo M.D.   On: 01/16/2014 20:41   US Renal  01/16/2014   CLINICAL DATA:  Recurrent UTI  EXAM: RENAL/URINARY TRACT ULTRASOUND COMPLETE  COMPARISON:  None.  FINDINGS: Right Kidney:  Length: 10.8 cm. There is mild increased echogenicity of the renal cortex. No mass or hydronephrosis.  Left Kidney:  Length: 12.2 cm. There is mild increased echogenicity of the renal cortex. No mass or hydronephrosis.  Bladder:  Appears normal for degree of bladder distention.  IMPRESSION: 1. No obstructive uropathy. 2. Mild increased renal cortical echogenicity as can be seen with medical renal disease.   Electronically Signed   By: Kathreen Devoid   On: 01/16/2014 19:18    Medications: Scheduled Meds: . aspirin EC  325 mg Oral Daily  . atorvastatin  30 mg Oral QHS  . folic acid  1 mg Oral Daily   And  . B-complex with vitamin C  1 tablet Oral Daily  . carbidopa-levodopa  0.5 tablet Oral TID  . cefTRIAXone (ROCEPHIN)  IV  1 g Intravenous Q24H  . darbepoetin  40 mcg Intravenous Q Tue-HD  . divalproex  1,500 mg Oral Daily  . docusate sodium  100 mg Oral QHS  . escitalopram  10 mg Oral Daily  . feeding supplement (NEPRO CARB STEADY)  237 mL Oral BID BM  . ferric gluconate (FERRLECIT/NULECIT) IV  125 mg Intravenous Q T,Th,Sa-HD  . gabapentin  300 mg Oral QHS  . insulin aspart  0-9 Units Subcutaneous TID WC  . lanthanum  2,000 mg Oral  TID WC  . metoprolol tartrate  6.25 mg Oral BID      LOS: 5 days   Ripudeep Krystal Eaton M.D. [facility] 01/20/2014, 10:46 AM Pager: [phone]  If 7PM-7AM, please contact night-coverage [email or web address] Password [email or web address]  **Disclaimer: This note was dictated with voice recognition software. Similar sounding words can inadvertently be transcribed and this note may contain transcription errors which may not have been  corrected upon publication of note.**

## 2014-01-20 NOTE — Progress Notes (Signed)
Subjective:  No current complaints, just returned from IR for lumbar epidural injection  Objective: Vital signs in last 24 hours: Temp:  [97.7 F (36.5 C)-98.7 F (37.1 C)] 97.7 F (36.5 C) (05/08 1147) Pulse Rate:  [73-84] 73 (05/08 1147) Resp:  [16-18] 16 (05/08 1147) BP: (100-132)/(53-71) 107/68 mmHg (05/08 1147) SpO2:  [94 %-99 %] 96 % (05/08 1147) Weight:  [115.4 kg (254 lb 6.6 oz)] 115.4 kg (254 lb 6.6 oz) (05/08 0437) Weight change: -0.1 kg (-3.5 oz)  Intake/Output from previous day: 05/07 0701 - 05/08 0700 In: 240 [P.O.:240] Out: 3250 [Urine:250] Intake/Output this shift: Total I/O In: 120 [P.O.:120] Out: -   Lab Results:  Recent Labs  01/19/14 0916  WBC 8.7  HGB 9.5*  HCT 28.9*  PLT 317   BMET:  Recent Labs  01/19/14 0916  NA 134*  K 5.1  CL 91*  CO2 24  GLUCOSE 103*  BUN 65*  CREATININE 10.18*  CALCIUM 9.3  ALBUMIN 2.4*   No results found for this basename: PTH,  in the last 72 hours Iron Studies: No results found for this basename: IRON, TIBC, TRANSFERRIN, FERRITIN,  in the last 72 hours  Studies/Results: No results found.  EXAM: General appearance:  Alert, in no apparent distress Resp:  CTA without rales, rhonchi, or wheezes Cardio:  RRR without murmur or rub GI: + BS, soft and nontender Extremities:  L BKA, no edema Access:  AVF @ LUA with + bruit  Dialysis: TTS [place]  4.5h 117.5kg 2/2.0 Bath Heparin 8000 LUA AVF  EPO 5000 Venofer 100/hd thru 5/19   Assessment/Plan: 1. AMS - MRI negative, EEG negative, possibly sec to UTI; MS now appears at baseline. 2. UTI - culture 5/3 + for E coli, on Rocephin. 3. LE pain - MRI of lumbar spine 5/4 showed severe spinal stenosis at L3-4 & L4-5 and L foraminal disc protrusion at L2-3, s/p lumbar epidural injection today.  4. ESRD - HD on TTS @ [place], K 5.1.  HD tomorrow. 5. HTN/Volume - BP 107/68 on Metoprolol, no meds; wt 115.4 kg s/p net UF 3 L yesterday, below EDW. 6. Anemia - Hgb down to 9.5, on  Aranesp 40 mcg, Fe per HD. 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 8. Nutrition - Alb 2.6, carb-mod renal diet, vitamin. 9. DM - per primary 10. Back pain / lumbar spinal stenosis- s/p epidural steroid injection 5/8 ordered by neurosurgery   LOS: 5 days   Ramiro Harvest 01/20/2014,1:31 PM  Pt seen, examined and agree w A/P as above.  Kelly Splinter MD pager [phone]    cell [phone] 01/20/2014, 4:10 PM

## 2014-01-21 LAB — RENAL FUNCTION PANEL
Albumin: 2.6 g/dL — ABNORMAL LOW (ref 3.5–5.2)
BUN: 55 mg/dL — ABNORMAL HIGH (ref 6–23)
CALCIUM: 9.6 mg/dL (ref 8.4–10.5)
CO2: 27 meq/L (ref 19–32)
Chloride: 91 mEq/L — ABNORMAL LOW (ref 96–112)
Creatinine, Ser: 8.5 mg/dL — ABNORMAL HIGH (ref 0.50–1.35)
GFR calc Af Amer: 6 mL/min — ABNORMAL LOW (ref >90)
GFR calc non Af Amer: 5 mL/min — ABNORMAL LOW (ref >90)
GLUCOSE: 145 mg/dL — AB (ref 70–99)
Phosphorus: 4.7 mg/dL — ABNORMAL HIGH (ref 2.3–4.6)
Potassium: 5 mEq/L (ref 3.7–5.3)
Sodium: 134 mEq/L — ABNORMAL LOW (ref 137–147)

## 2014-01-21 LAB — CBC
HCT: 29.4 % — ABNORMAL LOW (ref 39.0–52.0)
Hemoglobin: 9.5 g/dL — ABNORMAL LOW (ref 13.0–17.0)
MCH: 31 pg (ref 26.0–34.0)
MCHC: 32.3 g/dL (ref 30.0–36.0)
MCV: 96.1 fL (ref 78.0–100.0)
Platelets: 310 10*3/uL (ref 150–400)
RBC: 3.06 MIL/uL — AB (ref 4.22–5.81)
RDW: 14.5 % (ref 11.5–15.5)
WBC: 11.1 10*3/uL — ABNORMAL HIGH (ref 4.0–10.5)

## 2014-01-21 LAB — GLUCOSE, CAPILLARY
GLUCOSE-CAPILLARY: 114 mg/dL — AB (ref 70–99)
GLUCOSE-CAPILLARY: 114 mg/dL — AB (ref 70–99)
Glucose-Capillary: 147 mg/dL — ABNORMAL HIGH (ref 70–99)

## 2014-01-21 MED ORDER — LIDOCAINE-PRILOCAINE 2.5-2.5 % EX CREA: 1.0000 "application " | TOPICAL_CREAM | CUTANEOUS | Status: DC | PRN

## 2014-01-21 MED ORDER — LIDOCAINE HCL (PF) 1 % IJ SOLN
5.0000 mL | INTRAMUSCULAR | Status: DC | PRN
Start: 2014-01-21 — End: 2014-01-24

## 2014-01-21 MED ORDER — SODIUM CHLORIDE 0.9 % IV SOLN: 100.0000 mL | INTRAVENOUS | Status: DC | PRN

## 2014-01-21 MED ORDER — ALTEPLASE 2 MG IJ SOLR: 2.0000 mg | Freq: Once | INTRAMUSCULAR | Status: AC | PRN

## 2014-01-21 MED ORDER — HEPARIN SODIUM (PORCINE) 1000 UNIT/ML DIALYSIS: 1000.0000 [IU] | INTRAMUSCULAR | Status: DC | PRN

## 2014-01-21 MED ORDER — STROKE: EARLY STAGES OF RECOVERY BOOK
Freq: Once | Status: AC
  Administered 2014-01-21: 08:00:00
  Filled 2014-01-21 (×2): qty 1

## 2014-01-21 MED ORDER — PENTAFLUOROPROP-TETRAFLUOROETH EX AERO: 1.0000 "application " | INHALATION_SPRAY | CUTANEOUS | Status: DC | PRN

## 2014-01-21 MED ORDER — SENNOSIDES-DOCUSATE SODIUM 8.6-50 MG PO TABS: 1.0000 | ORAL_TABLET | Freq: Every evening | ORAL | Status: DC | PRN

## 2014-01-21 MED ORDER — NEPRO/CARBSTEADY PO LIQD: 237.0000 mL | ORAL | Status: DC | PRN

## 2014-01-21 NOTE — Clinical Social Work Note (Signed)
CSW informed by RN, patient is pending MRI and per MD is not ready for d/c on this date. Patient and wife notified by RN. CSW made [place] ([place]) aware. CSW to provide follow-up tomorrow.   [place], [place] Weekend Clinical Social Worker [phone]

## 2014-01-21 NOTE — Clinical Social Work Note (Addendum)
CSW informed by RN patient is ready for D/C to [facility] ([place]). CSW contacted facility and confirmed patient return to facility. CSW instructed to fax d/c summary and FL2 to [phone].  Patient and spouse [name]) agreeable to plans. Per RN, patient is to have dialysis with a start time between 12 and 2. RN to discuss patient beginning dialysis earlier with hemo. CSW provided RN with room and report number. CSW to arrange transport via [place]. No further needs identified. CSW signing off.   [place], [place] Weekend Clinical Social Worker [phone]

## 2014-01-21 NOTE — Progress Notes (Signed)
Subjective:  Currently comfortable in bed, but concerned that he cannot lift his left arm Says that it started a few days ago.  He had R brain surgery for tumor in 2009 but denies having had L arm weakness before.  The MRI showed encephalomalacia of the R temporal lobe area.   Objective: Vital signs in last 24 hours: Temp:  [97.4 F (36.3 C)-98.4 F (36.9 C)] 97.4 F (36.3 C) (05/09 0804) Pulse Rate:  [63-83] 73 (05/09 0804) Resp:  [16-20] 16 (05/09 0804) BP: (107-140)/(64-72) 112/65 mmHg (05/09 0804) SpO2:  [95 %-97 %] 95 % (05/09 0804) Weight:  [116.2 kg (256 lb 2.8 oz)] 116.2 kg (256 lb 2.8 oz) (05/09 0400) Weight change: -2.1 kg (-4 lb 10.1 oz)  Intake/Output from previous day: 05/08 0701 - 05/09 0700 In: 120 [P.O.:120] Out: -    Lab Results:  Recent Labs  01/19/14 0916 01/21/14  WBC 8.7 11.1*  HGB 9.5* 9.5*  HCT 28.9* 29.4*  PLT 317 310   BMET:  Recent Labs  01/19/14 0916 01/21/14 0140  NA 134* 134*  K 5.1 5.0  CL 91* 91*  CO2 24 27  GLUCOSE 103* 145*  BUN 65* 55*  CREATININE 10.18* 8.50*  CALCIUM 9.3 9.6  ALBUMIN 2.4* 2.6*   No results found for this basename: PTH,  in the last 72 hours Iron Studies: No results found for this basename: IRON, TIBC, TRANSFERRIN, FERRITIN,  in the last 72 hours  Studies/Results: Mr Brain Wo Contrast  01/20/2014   CLINICAL DATA:  Code stroke.  Left-sided weakness.  EXAM: MRI HEAD WITHOUT CONTRAST  TECHNIQUE: Multiplanar, multiecho pulse sequences of the brain and surrounding structures were obtained without intravenous contrast.  COMPARISON:  MRI 01/16/2014  FINDINGS: Prior right temporal craniotomy with encephalomalacia in the right temporal lobe which is unchanged. There is generalized atrophy.  Negative for acute infarct. Negative for hemorrhage or mass lesion. No change from the recent study.  IMPRESSION: No acute abnormality.   Electronically Signed   By: Franchot Gallo M.D.   On: 01/20/2014 16:37   Ir  Epidurography  01/21/2014   CLINICAL DATA:  Low back and right lower extremity pain. MR demonstrates multilevel spinal stenosis most marked L4-5. No previous lumbar surgery.  The procedure, risks, benefits, and alternatives were explained to the patient. Questions regarding the procedure were encouraged and answered. The patient understands and consents to the procedure.  PROCEDURE: LUMBAR EPIDURAL INJECTION: An interlaminar approach was performed on the right at L4-5. Operator donned sterile gloves and mask. The overlying skin was cleansed and anesthetized. A 20 gauge Crawford epidural needle was advanced using loss-of-resistance technique.  DIAGNOSTIC EPIDURAL INJECTION: Injection of Omnipaque 180 shows a good epidural pattern with spread above and below the level of needle placement. No intrathecal or vascular opacification is seen.  THERAPEUTIC EPIDURAL INJECTION: 120mg  of Depo-Medrol mixed with 23ml lidocaine 1% were instilled. The procedure was well-tolerated, and the patient was discharged thirty minutes following the injection in good condition.  FLUOROSCOPY TIME:  24 seconds  COMPLICATIONS: None  IMPRESSION: Technically successful epidural injection on the right at L4-5.   Electronically Signed   By: Arne Cleveland M.D.   On: 01/21/2014 08:46   EXAM:  General appearance: Alert, in no apparent distress  Resp: CTA without rales, rhonchi, or wheezes  Cardio: RRR without murmur or rub  GI: + BS, soft and nontender  Extremities: L BKA, no edema LUE weakness 2/5 Access: AVF @ LUA with + bruit  [place] [facility] [place]  4.5h 117.5kg 2/2.0 [place] Heparin 8000 LUA AVF  EPO 5000 Venofer 100/hd thru 5/19   Assessment/Plan: 1. AMS - MRI negative, EEG negative, possibly sec to UTI; MS now at baseline.  2. UTI - culture 5/3 + for E coli, on Rocephin.  3. LE pain - MRI of lumbar spine 5/4 showed severe spinal stenosis at L3-4 & L4-5 and L foraminal disc protrusion at L2-3, s/p lumbar epidural injection  yesterday.  4. ESRD - HD on TTS @ [place], K 5. HD pending today.  5. HTN/Volume - BP 112/65 on Metoprolol, no meds; wt 116.2 kg with HD pending, below EDW.  6. Anemia - Hgb 9.5, on Aranesp 40 mcg on Tues, Fe per HD.  7. 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 Use 2Ca bath. 8. Nutrition - Alb 2.6, carb-mod renal diet, vitamin.  9. DM - per primary Back pain / lumbar spinal stenosis- s/p epidural steroid injection 5/8 ordered by neurosurgery 10. LUE weakness - MRI brain yesterday showed no acute abnormality D/W Dr Ellene Route, will get C-spine MRI     LOS: 6 days   Ramiro Harvest 01/21/2014,10:39 AM  Pt seen, examined, agree w assess/plan as above with additions as indicated.  Kelly Splinter MD pager [phone]    cell [phone] 01/21/2014, 10:56 AM

## 2014-01-21 NOTE — Progress Notes (Signed)
Patient ID: [name] [name]  male  VHQ:[id number]    DOB: 07/03/1941    DOA: 01/15/2014  PCP: Donetta Potts, MD  Assessment/Plan: Principal Problem:   Acute encephalopathy possibly due to the UTI, medication, resolved - EEG negative for epileptic disorder  - MRI brain negative for stroke.  - Ammonia normal at 30. Valproic acid at 54.  - Hold percocet. resume gabapentin.  - Continue Rocephin IV  Left upper extremity weakness: - Patient actually reported to me during the encounter that it was better from yesterday, MRI of the brain was negative for acute CVA, unchanged encephalomalacia in the right temporal from prior surgery. - Renal discussed with Dr. Ellene Route who recommended MRI of the C-spine, which is ordered    ESRD (end stage renal disease) on dialysis: TTS - Renal following, HD today   Right lower extremity pain - differentials include osteoarthritis of the right hip but patient's pain radiates all the way to the ankle so MRI of the L-spine ordered.  - Doppler: There is no DVT or SVT noted in the right lower extremity. - ABI; unable to ascertain ABI secondary to calcified vessels -  MRI of lumbar spine showed small left foraminal disc protrusion at L2-3, and spinal stenosis at multiple levels.  -Neurosurgery was consulted, seen by Dr. 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  - Continue pain control, status post ESI 5/8 by interventional radiology  Escherichia coli UTI (lower urinary tract infection) - Continue with Ceftriaxone. Urine growing E coli. Dr Junious Silk with urology recommended out patient cystoscopy and PVR check  Seizure disorder - Continue Depakote   DVT Prophylaxis:  Code Status:  Family Communication: Patient alert and oriented x4, discussed in detail with the patient Disposition:   Consultants:  Neurosurgeon    Urology  Nephrology  Intervention radiology  Procedures:  EEG  Epidural steroid injection 01/20/14  Antibiotics:  IV rocephin     Subjective:  Patient seen and examined,mental status now at baseline. Patient reports that his left upper arm weakness is somewhat better from yesterday, however he still concerned. MRI was negative for acute CVA.  Objective: Weight change: -2.1 kg (-4 lb 10.1 oz) No intake or output data in the 24 hours ending 01/21/14 1140 Blood pressure 112/65, pulse 73, temperature 97.4 F (36.3 C), temperature source Oral, resp. rate 16, height 6\' 4"  (1.93 m), weight 116.2 kg (256 lb 2.8 oz), SpO2 95.00%.  Physical Exam: General: Alert and awake, oriented x3, not in any acute distress. 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   Lab Results: Basic Metabolic Panel:  Recent Labs Lab 01/19/14 0916 01/21/14 0140  NA 134* 134*  K 5.1 5.0  CL 91* 91*  CO2 24 27  GLUCOSE 103* 145*  BUN 65* 55*  CREATININE 10.18* 8.50*  CALCIUM 9.3 9.6  PHOS 5.7* 4.7*   Liver Function Tests:  Recent Labs Lab 01/15/14 1717 01/16/14 0405 01/19/14 0916 01/21/14 0140  AST 16 17  --   --   ALT <5 <5  --   --   ALKPHOS 56 56  --   --   BILITOT 0.3 0.2*  --   --   PROT 8.0 7.1  --   --  ALBUMIN 2.9* 2.6* 2.4* 2.6*   No results found for this basename: LIPASE, AMYLASE,  in the last 168 hours  Recent Labs Lab 01/15/14 1717  AMMONIA 30   CBC:  Recent Labs Lab 01/16/14 0405  01/19/14 0916 01/21/14  WBC 10.4  < > 8.7 11.1*  NEUTROABS 5.5  --   --   --   HGB 9.5*  < > 9.5* 9.5*  HCT 29.1*  < > 28.9* 29.4*  MCV 96.4  < > 94.8 96.1  PLT 323  < > 317 310  < > = values in this interval not displayed. Cardiac Enzymes: No results found for this basename: CKTOTAL, CKMB, CKMBINDEX, TROPONINI,   in the last 168 hours BNP: No components found with this basename: POCBNP,  CBG:  Recent Labs Lab 01/20/14 0744 01/20/14 1149 01/20/14 1643 01/20/14 2110 01/21/14 0807  GLUCAP 116* 167* 106* 177* 114*     Micro Results: Recent Results (from the past 240 hour(s))  URINE CULTURE     Status: None   Collection Time    01/15/14  6:08 PM      Result Value Ref Range Status   Specimen Description URINE, CLEAN CATCH   Final   Special Requests ADDED 1958   Final   Culture  Setup Time     Final   Value: 01/15/2014 20:37     Performed at [place]     Final   Value: >=100,000 COLONIES/ML     Performed at [facility]   Culture     Final   Value: ESCHERICHIA COLI     Performed at [facility]   Report Status 01/17/2014 FINAL   Final   Organism ID, Bacteria ESCHERICHIA COLI   Final    Studies/Results: Dg Chest 2 View  01/15/2014   CLINICAL DATA:  Leg swelling.  Lethargy.  EXAM: CHEST  2 VIEW  COMPARISON:  09/30/2013.  FINDINGS: Poor inspiration. Mild bibasilar atelectasis. Grossly normal sized heart. Thoracic spine degenerative changes.  IMPRESSION: Poor inspiration with mild bibasilar atelectasis.   Electronically Signed   By: Enrique Sack M.D.   On: 01/15/2014 14:36   Dg Hip Complete Right  01/13/2014   CLINICAL DATA:  HIP PAIN  EXAM: RIGHT HIP - COMPLETE 2+ VIEW  COMPARISON:  None.  FINDINGS: There is no evidence of hip fracture or dislocation. Areas of hypertrophic spurring and periarticular sclerosis involving the femoral head and acetabulum. Atherosclerotic calcifications are appreciated. Soft tissues otherwise unremarkable. There is scattered areas of retained contrast within the colon greatest area of confluence in the rectosigmoid region.  IMPRESSION: Osteoarthritic changes without acute osseous abnormalities.   Electronically Signed   By: Margaree Mackintosh M.D.   On: 01/13/2014 12:21   Ct Head Wo Contrast  01/15/2014   CLINICAL DATA:  Head  pain  EXAM: CT HEAD WITHOUT CONTRAST  TECHNIQUE: Contiguous axial images were obtained from the base of the skull through the vertex without intravenous contrast.  COMPARISON:  Brain MRI, 12/18/2013 Hand head CT, 09/08/13.  FINDINGS: Ventricles are normal in configuration. There is ventricular and sulcal enlargement reflecting mild to moderate atrophy  No parenchymal masses or mass effect. There is no evidence of a recent infarct. Encephalomalacia along the right temporal lobe is stable due to a previous right frontotemporal craniotomy.  No extra-axial masses or abnormal fluid collections.  There is no intracranial hemorrhage right craniotomy changes are stable. Minimal maxillary sinus mucosal thickening. Remaining sinuses  and mastoid air cells are clear.  IMPRESSION: 1. No acute intracranial abnormalities. No change from the prior study.   Electronically Signed   By: Lajean Manes M.D.   On: 01/15/2014 19:10   Mr Brain Wo Contrast  01/16/2014   CLINICAL DATA:  Stroke  EXAM: MRI HEAD WITHOUT CONTRAST  TECHNIQUE: Multiplanar, multiecho pulse sequences of the brain and surrounding structures were obtained without intravenous contrast.  COMPARISON:  MRI 12/18/2013.  CT 01/15/2014  FINDINGS: Prior right temporal craniotomy. Encephalomalacia right temporal lobe is stable.  Mild atrophy.  Negative for hydrocephalus.  Negative for acute infarct.  Negative for mass lesion.  IMPRESSION: No acute abnormality.   Electronically Signed   By: Franchot Gallo M.D.   On: 01/16/2014 20:36   Mr Lumbar Spine Wo Contrast  01/16/2014   CLINICAL DATA:  Right lower extremity pain.  Dialysis patient.  EXAM: MRI LUMBAR SPINE WITHOUT CONTRAST  TECHNIQUE: Multiplanar, multisequence MR imaging of the lumbar spine was performed. No intravenous contrast was administered.  COMPARISON:  None.  FINDINGS: Negative for acute fracture or mass. Normal lumbar alignment. Conus medullaris is normal and terminates at L1-2.  Small gallstones are present.   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  L1-2: Bilateral facet hypertrophy with mild to moderate spinal stenosis  L2-3: Diffuse disc bulging bilateral facet hypertrophy and moderate spinal stenosis. Left foraminal disc protrusion is present  L3-4: Diffuse disc bulging and bilateral facet hypertrophy. Moderate to severe spinal stenosis. Bilateral foraminal narrowing  L4-5: Disc bulging and spondylosis. Central disc protrusion and moderate facet hypertrophy. Severe spinal stenosis. Lateral recess and foraminal encroachment bilaterally.  L5-S1: Disc bulging and spondylosis. Bilateral facet hypertrophy. Spinal stenosis and foraminal encroachment bilaterally of mild-to-moderate degree.  IMPRESSION: The patient has a congenitally small spinal canal. There is superimposed degenerative change and spinal stenosis at multiple levels. Spinal stenosis most severe at L3-4 and L4-5. Small left foraminal disc protrusion at L2-3.  Small gallstones.   Electronically Signed   By: Franchot Gallo M.D.   On: 01/16/2014 20:41   US Renal  01/16/2014   CLINICAL DATA:  Recurrent UTI  EXAM: RENAL/URINARY TRACT ULTRASOUND COMPLETE  COMPARISON:  None.  FINDINGS: Right Kidney:  Length: 10.8 cm. There is mild increased echogenicity of the renal cortex. No mass or hydronephrosis.  Left Kidney:  Length: 12.2 cm. There is mild increased echogenicity of the renal cortex. No mass or hydronephrosis.  Bladder:  Appears normal for degree of bladder distention.  IMPRESSION: 1. No obstructive uropathy. 2. Mild increased renal cortical echogenicity as can be seen with medical renal disease.   Electronically Signed   By: Kathreen Devoid   On: 01/16/2014 19:18    Medications: Scheduled Meds: .  stroke: mapping our early stages of recovery book   Does not apply Once  . aspirin EC  325 mg Oral Daily  . atorvastatin  30 mg Oral QHS  . folic acid  1 mg Oral Daily   And  . B-complex with vitamin C  1 tablet Oral Daily  .  carbidopa-levodopa  0.5 tablet Oral TID  . cefTRIAXone (ROCEPHIN)  IV  1 g Intravenous Q24H  . darbepoetin  40 mcg Intravenous Q Tue-HD  . divalproex  1,500 mg Oral Daily  . docusate sodium  100 mg Oral QHS  . escitalopram  10 mg Oral Daily  . feeding supplement (NEPRO CARB STEADY)  237 mL Oral BID BM  . ferric gluconate (FERRLECIT/NULECIT)  IV  125 mg Intravenous Q T,Th,Sa-HD  . gabapentin  300 mg Oral QHS  . insulin aspart  0-9 Units Subcutaneous TID WC  . lanthanum  2,000 mg Oral TID WC  . metoprolol tartrate  6.25 mg Oral BID      LOS: 6 days   Blannie Shedlock Krystal Eaton M.D. [facility] 01/21/2014, 11:40 AM Pager: [phone]  If 7PM-7AM, please contact night-coverage [email or web address] Password [email or web address]  **Disclaimer: This note was dictated with voice recognition software. Similar sounding words can inadvertently be transcribed and this note may contain transcription errors which may not have been corrected upon publication of note.**

## 2014-01-21 NOTE — Progress Notes (Signed)
Patient ID: [name] [name], male   DOB: 03/09/1941, 73 y.o.   MRN: [id number] Back pain is better after epidural injection. Patient now has some left-sided weakness in the upper extremity. This presents mostly in the deltoid and the bicep with 1/5 strength in the deltoid and 2-3/5 strength in the bicep wrist extensor and grip is also weak on that left side. CT scan of passes been performed which demonstrates that the patient has diffuse spondylosis. An MRI of the cervical spine is appropriate and is being ordered by Dr. Carolynne Edouard will followup with this.

## 2014-01-21 NOTE — Procedures (Signed)
I was present at this dialysis session, have reviewed the session itself and made  appropriate changes  Kelly Splinter MD (pgr) [phone]    ([phone] 01/21/2014, 3:10 PM

## 2014-01-22 ENCOUNTER — Inpatient Hospital Stay (HOSPITAL_COMMUNITY): Payer: Medicare Other

## 2014-01-22 LAB — GLUCOSE, CAPILLARY
GLUCOSE-CAPILLARY: 112 mg/dL — AB (ref 70–99)
GLUCOSE-CAPILLARY: 187 mg/dL — AB (ref 70–99)
GLUCOSE-CAPILLARY: 150 mg/dL — AB (ref 70–99)
GLUCOSE-CAPILLARY: 140 mg/dL — AB (ref 70–99)

## 2014-01-22 MED ORDER — LORAZEPAM 2 MG/ML IJ SOLN
1.0000 mg | Freq: Once | INTRAMUSCULAR | Status: AC
  Administered 2014-01-22: 1 mg via INTRAVENOUS
  Filled 2014-01-22: qty 1

## 2014-01-22 MED ORDER — HYDROMORPHONE HCL PF 1 MG/ML IJ SOLN
1.0000 mg | INTRAMUSCULAR | Status: DC | PRN
  Administered 2014-01-22: 1 mg via INTRAVENOUS
  Filled 2014-01-22: qty 1

## 2014-01-22 NOTE — Progress Notes (Signed)
Patient ID: [name] [name], male   DOB: 1941-05-05, 73 y.o.   MRN: [id number] Feels fair. Still has left arm pain and weakness. MRI has not been completed.  Lumbar spine feels better. Patient notes that he is more mobile.  Await MRI cervical spine.

## 2014-01-22 NOTE — Progress Notes (Signed)
Subjective:  No new complaints  Objective: Vital signs in last 24 hours: Temp:  [97 F (36.1 C)-98.1 F (36.7 C)] 98.1 F (36.7 C) (05/09 2000) Pulse Rate:  [63-75] 75 (05/09 2000) Resp:  [16-18] 16 (05/09 2000) BP: (93-142)/(60-74) 126/67 mmHg (05/09 2000) SpO2:  [95 %-97 %] 97 % (05/09 2000) Weight:  [114.5 kg (252 lb 6.8 oz)-117 kg (257 lb 15 oz)] 114.5 kg (252 lb 6.8 oz) (05/09 1530) Weight change: 0.8 kg (1 lb 12.2 oz)  Intake/Output from previous day: 05/09 0701 - 05/10 0700 In: -  Out: 2100 [Urine:200] Total I/O In: -  Out: 200 [Urine:200] Lab Results:  Recent Labs  01/19/14 0916 01/21/14  WBC 8.7 11.1*  HGB 9.5* 9.5*  HCT 28.9* 29.4*  PLT 317 310   BMET:   Recent Labs  01/19/14 0916 01/21/14 0140  NA 134* 134*  K 5.1 5.0  CL 91* 91*  CO2 24 27  GLUCOSE 103* 145*  BUN 65* 55*  CREATININE 10.18* 8.50*  CALCIUM 9.3 9.6  ALBUMIN 2.4* 2.6*   No results found for this basename: PTH,  in the last 72 hours Iron Studies: No results found for this basename: IRON, TIBC, TRANSFERRIN, FERRITIN,  in the last 72 hours  Studies/Results: No results found. EXAM:  General appearance: Alert, in no apparent distress  Resp: CTA without rales, rhonchi, or wheezes  Cardio: RRR without murmur or rub  GI: + BS, soft and nontender  Extremities: L BKA, no edema LUE weakness 2/5 Access: AVF @ LUA with + bruit   Dialysis: TTS [place]  4.5h 117.5kg 2/2.0 Bath Heparin 8000 LUA AVF  EPO 5000 Venofer 100/hd thru 5/19   Assessment/Plan: 1. AMS - sec to UTI, resolved 2. UTI - culture 5/3 + for E coli, on Rocephin.  3. LE pain - MRI of lumbar spine 5/4 showed severe spinal stenosis at L3-4 & L4-5 and L foraminal disc protrusion at L2-3, s/p lumbar epidural injection 4. ESRD on HD.  5. HTN/Volume - BP 112/65 on Metoprolol, no meds; wt 116.2 kg with HD pending, below EDW.  6. Anemia - Hgb 9.5, on Aranesp 40 mcg on Tues, Fe per HD.  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 Use 2Ca bath. 8. Nutrition - Alb 2.6, carb-mod renal diet, vitamin.  9. DM - per primary 10. LUE weakness - MRI brain neg for acute CVA, MRI C-spine pending    Kelly Splinter MD (pgr) [phone]    (c) [phone] 01/22/2014, 6:01 AM

## 2014-01-22 NOTE — Progress Notes (Signed)
Patient ID: [name] [name]  male  QIH:[id number]    DOB: May 20, 1941    DOA: 01/15/2014  PCP: Donetta Potts, MD  Assessment/Plan: Principal Problem:   Acute encephalopathy possibly due to the UTI, medication, resolved - EEG negative for epileptic disorder  - MRI brain negative for stroke.  - Ammonia normal at 30. Valproic acid at 54.  - Hold percocet. resumed gabapentin.  - Continue Rocephin IV, started on 5/3, will not need any antibiotics upon dc  Left upper extremity weakness: - States improving MRI of the brain was negative for acute CVA, unchanged encephalomalacia in the right temporal from prior surgery. - Renal d/w Dr. Ellene Route who recommended MRI of the C-spine, which is ordered, still pending    ESRD (end stage renal disease) on dialysis: TTS - Renal following for HD  Back pain with right lower extremity pain -improved - Doppler: There is no DVT or SVT noted in the right lower extremity. - ABI; unable to ascertain ABI secondary to calcified vessels - MRI of lumbar spine showed small left foraminal disc protrusion at L2-3, and spinal stenosis at multiple levels.  - Neurosurgery was consulted, seen by Dr. 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  - Continue pain control, status post ESI 5/8 by interventional radiology  Escherichia coli UTI (lower urinary tract infection) - Continue with Ceftriaxone, no abx upon DC. -  Urine growing E coli. Dr Junious Silk with urology recommended out patient cystoscopy and PVR check  Seizure disorder - Continue Depakote   DVT Prophylaxis:  Code Status:  Family Communication: Patient alert and oriented x4, discussed in detail with the patient  Disposition: Hopefully tomorrow  Consultants:  Neurosurgeon   Urology  Nephrology  Intervention radiology  Procedures:  EEG  Epidural steroid injection  01/20/14  Antibiotics:  IV rocephin     Subjective:  Left upper extremity weakness improving, no acute issues overnight  Objective: Weight change: 0.8 kg (1 lb 12.2 oz)  Intake/Output Summary (Last 24 hours) at 01/22/14 1052 Last data filed at 01/21/14 2200  Gross per 24 hour  Intake      0 ml  Output   2100 ml  Net  -2100 ml   Blood pressure 119/62, pulse 60, temperature 97.4 F (36.3 C), temperature source Oral, resp. rate 18, height 6\' 4"  (1.93 m), weight 111.6 kg (246 lb 0.5 oz), SpO2 100.00%.  Physical Exam: General: Alert and awake, oriented x3, not in any acute distress. 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   Lab Results: Basic Metabolic Panel:  Recent Labs Lab 01/19/14 0916 01/21/14 0140  NA 134* 134*  K 5.1 5.0  CL 91* 91*  CO2 24 27  GLUCOSE 103* 145*  BUN 65* 55*  CREATININE 10.18* 8.50*  CALCIUM 9.3 9.6  PHOS 5.7* 4.7*   Liver Function Tests:  Recent Labs Lab 01/15/14 1717 01/16/14 0405 01/19/14 0916 01/21/14 0140  AST 16 17  --   --   ALT <5 <5  --   --   ALKPHOS 56 56  --   --   BILITOT 0.3 0.2*  --   --   PROT 8.0 7.1  --   --   ALBUMIN 2.9* 2.6* 2.4* 2.6*  No results found for this basename: LIPASE, AMYLASE,  in the last 168 hours  Recent Labs Lab 01/15/14 1717  AMMONIA 30   CBC:  Recent Labs Lab 01/16/14 0405  01/19/14 0916 01/21/14  WBC 10.4  < > 8.7 11.1*  NEUTROABS 5.5  --   --   --   HGB 9.5*  < > 9.5* 9.5*  HCT 29.1*  < > 28.9* 29.4*  MCV 96.4  < > 94.8 96.1  PLT 323  < > 317 310  < > = values in this interval not displayed. Cardiac Enzymes: No results found for this basename: CKTOTAL, CKMB, CKMBINDEX, TROPONINI,  in the last 168 hours BNP: No components found with this basename: POCBNP,  CBG:  Recent Labs Lab 01/20/14 2110 01/21/14 0807  01/21/14 1715 01/21/14 2039 01/22/14 0757  GLUCAP 177* 114* 114* 147* 112*     Micro Results: Recent Results (from the past 240 hour(s))  URINE CULTURE     Status: None   Collection Time    01/15/14  6:08 PM      Result Value Ref Range Status   Specimen Description URINE, CLEAN CATCH   Final   Special Requests ADDED 1958   Final   Culture  Setup Time     Final   Value: 01/15/2014 20:37     Performed at [place]     Final   Value: >=100,000 COLONIES/ML     Performed at [facility]   Culture     Final   Value: ESCHERICHIA COLI     Performed at [facility]   Report Status 01/17/2014 FINAL   Final   Organism ID, Bacteria ESCHERICHIA COLI   Final    Studies/Results: Dg Chest 2 View  01/15/2014   CLINICAL DATA:  Leg swelling.  Lethargy.  EXAM: CHEST  2 VIEW  COMPARISON:  09/30/2013.  FINDINGS: Poor inspiration. Mild bibasilar atelectasis. Grossly normal sized heart. Thoracic spine degenerative changes.  IMPRESSION: Poor inspiration with mild bibasilar atelectasis.   Electronically Signed   By: Enrique Sack M.D.   On: 01/15/2014 14:36   Dg Hip Complete Right  01/13/2014   CLINICAL DATA:  HIP PAIN  EXAM: RIGHT HIP - COMPLETE 2+ VIEW  COMPARISON:  None.  FINDINGS: There is no evidence of hip fracture or dislocation. Areas of hypertrophic spurring and periarticular sclerosis involving the femoral head and acetabulum. Atherosclerotic calcifications are appreciated. Soft tissues otherwise unremarkable. There is scattered areas of retained contrast within the colon greatest area of confluence in the rectosigmoid region.  IMPRESSION: Osteoarthritic changes without acute osseous abnormalities.   Electronically Signed   By: Margaree Mackintosh M.D.   On: 01/13/2014 12:21   Ct Head Wo Contrast  01/15/2014   CLINICAL DATA:  Head pain  EXAM: CT HEAD WITHOUT CONTRAST  TECHNIQUE: Contiguous axial images were obtained from the base of the skull through the vertex  without intravenous contrast.  COMPARISON:  Brain MRI, 12/18/2013 Hand head CT, 09/08/13.  FINDINGS: Ventricles are normal in configuration. There is ventricular and sulcal enlargement reflecting mild to moderate atrophy  No parenchymal masses or mass effect. There is no evidence of a recent infarct. Encephalomalacia along the right temporal lobe is stable due to a previous right frontotemporal craniotomy.  No extra-axial masses or abnormal fluid collections.  There is no intracranial hemorrhage right craniotomy changes are stable. Minimal maxillary sinus mucosal thickening. Remaining sinuses and mastoid air cells are clear.  IMPRESSION: 1. No acute intracranial abnormalities. No change from the prior study.   Electronically Signed   By: Lajean Manes M.D.   On: 01/15/2014 19:10   Mr Brain Wo Contrast  01/16/2014   CLINICAL DATA:  Stroke  EXAM: MRI HEAD WITHOUT CONTRAST  TECHNIQUE: Multiplanar, multiecho pulse sequences of the brain and surrounding structures were obtained without intravenous contrast.  COMPARISON:  MRI 12/18/2013.  CT 01/15/2014  FINDINGS: Prior right temporal craniotomy. Encephalomalacia right temporal lobe is stable.  Mild atrophy.  Negative for hydrocephalus.  Negative for acute infarct.  Negative for mass lesion.  IMPRESSION: No acute abnormality.   Electronically Signed   By: Franchot Gallo M.D.   On: 01/16/2014 20:36   Mr Lumbar Spine Wo Contrast  01/16/2014   CLINICAL DATA:  Right lower extremity pain.  Dialysis patient.  EXAM: MRI LUMBAR SPINE WITHOUT CONTRAST  TECHNIQUE: Multiplanar, multisequence MR imaging of the lumbar spine was performed. No intravenous contrast was administered.  COMPARISON:  None.  FINDINGS: Negative for acute fracture or mass. Normal lumbar alignment. Conus medullaris is normal and terminates at L1-2.  Small gallstones are present.  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  L1-2: Bilateral facet hypertrophy with mild to moderate  spinal stenosis  L2-3: Diffuse disc bulging bilateral facet hypertrophy and moderate spinal stenosis. Left foraminal disc protrusion is present  L3-4: Diffuse disc bulging and bilateral facet hypertrophy. Moderate to severe spinal stenosis. Bilateral foraminal narrowing  L4-5: Disc bulging and spondylosis. Central disc protrusion and moderate facet hypertrophy. Severe spinal stenosis. Lateral recess and foraminal encroachment bilaterally.  L5-S1: Disc bulging and spondylosis. Bilateral facet hypertrophy. Spinal stenosis and foraminal encroachment bilaterally of mild-to-moderate degree.  IMPRESSION: The patient has a congenitally small spinal canal. There is superimposed degenerative change and spinal stenosis at multiple levels. Spinal stenosis most severe at L3-4 and L4-5. Small left foraminal disc protrusion at L2-3.  Small gallstones.   Electronically Signed   By: Franchot Gallo M.D.   On: 01/16/2014 20:41   US Renal  01/16/2014   CLINICAL DATA:  Recurrent UTI  EXAM: RENAL/URINARY TRACT ULTRASOUND COMPLETE  COMPARISON:  None.  FINDINGS: Right Kidney:  Length: 10.8 cm. There is mild increased echogenicity of the renal cortex. No mass or hydronephrosis.  Left Kidney:  Length: 12.2 cm. There is mild increased echogenicity of the renal cortex. No mass or hydronephrosis.  Bladder:  Appears normal for degree of bladder distention.  IMPRESSION: 1. No obstructive uropathy. 2. Mild increased renal cortical echogenicity as can be seen with medical renal disease.   Electronically Signed   By: Kathreen Devoid   On: 01/16/2014 19:18    Medications: Scheduled Meds: . aspirin EC  325 mg Oral Daily  . atorvastatin  30 mg Oral QHS  . folic acid  1 mg Oral Daily   And  . B-complex with vitamin C  1 tablet Oral Daily  . carbidopa-levodopa  0.5 tablet Oral TID  . cefTRIAXone (ROCEPHIN)  IV  1 g Intravenous Q24H  . darbepoetin  40 mcg Intravenous Q Tue-HD  . divalproex  1,500 mg Oral Daily  . docusate sodium  100 mg Oral  QHS  . escitalopram  10 mg Oral Daily  . feeding supplement (NEPRO CARB STEADY)  237 mL Oral BID BM  . ferric gluconate (FERRLECIT/NULECIT) IV  125 mg Intravenous Q T,Th,Sa-HD  . gabapentin  300 mg Oral QHS  . insulin aspart  0-9 Units Subcutaneous TID  WC  . lanthanum  2,000 mg Oral TID WC  . metoprolol tartrate  6.25 mg Oral BID      LOS: 7 days   Ripudeep Krystal Eaton M.D. [facility] 01/22/2014, 10:52 AM Pager: [phone]  If 7PM-7AM, please contact night-coverage [email or web address] Password [email or web address]  **Disclaimer: This note was dictated with voice recognition software. Similar sounding words can inadvertently be transcribed and this note may contain transcription errors which may not have been corrected upon publication of note.**

## 2014-01-23 LAB — GLUCOSE, CAPILLARY
GLUCOSE-CAPILLARY: 212 mg/dL — AB (ref 70–99)
GLUCOSE-CAPILLARY: 187 mg/dL — AB (ref 70–99)
Glucose-Capillary: 168 mg/dL — ABNORMAL HIGH (ref 70–99)
Glucose-Capillary: 197 mg/dL — ABNORMAL HIGH (ref 70–99)

## 2014-01-23 NOTE — Progress Notes (Signed)
PT recommended SNF level of care. CSW met with patient discussed recommendations at length. Patient agrees that he needs extra PT before returning to his ALF. CSW will fax patient out for SNF placement.   Rhea Pink, MSW, [place]

## 2014-01-23 NOTE — Care Management (Signed)
1548 01-23-14  Medicare IM signed by patient and signed copy left on chart. Ocie Cornfield Isabel, RN,BSN [phone]

## 2014-01-23 NOTE — Progress Notes (Signed)
[facility] ROUNDING NOTE   Subjective:   Interval History: no complaints except for the upper arm weakness  Objective:  Vital signs in last 24 hours:  Temp:  [97.3 F (36.3 C)-97.9 F (36.6 C)] 97.7 F (36.5 C) (05/11 0833) Pulse Rate:  [54-69] 61 (05/11 0833) Resp:  [18] 18 (05/11 0833) BP: (107-148)/(65-83) 148/83 mmHg (05/11 0833) SpO2:  [96 %-100 %] 99 % (05/11 0833) Weight:  [116.438 kg (256 lb 11.2 oz)] 116.438 kg (256 lb 11.2 oz) (05/11 0400)  Weight change: -0.562 kg (-1 lb 3.8 oz) Filed Weights   01/21/14 1530 01/22/14 0400 01/23/14 0400  Weight: 114.5 kg (252 lb 6.8 oz) 111.6 kg (246 lb 0.5 oz) 116.438 kg (256 lb 11.2 oz)    Intake/Output: I/O last 3 completed shifts: In: -  Out: 200 [Urine:200]   Intake/Output this shift:     CVS- RRR RS- CTA ABD- BS present soft non-distended EXT- no edema L BKA, no edema LUE weakness 2/5  Access: AVF @ LUA with + bruit   Basic Metabolic Panel:  Recent Labs Lab 01/17/14 0640 01/19/14 0916 01/21/14 0140  NA 136* 134* 134*  K 5.5* 5.1 5.0  CL 91* 91* 91*  CO2 22 24 27   GLUCOSE 143* 103* 145*  BUN 95* 65* 55*  CREATININE 14.29* 10.18* 8.50*  CALCIUM 9.7 9.3 9.6  PHOS  --  5.7* 4.7*    Liver Function Tests:  Recent Labs Lab 01/19/14 0916 01/21/14 0140  ALBUMIN 2.4* 2.6*   No results found for this basename: LIPASE, AMYLASE,  in the last 168 hours No results found for this basename: AMMONIA,  in the last 168 hours  CBC:  Recent Labs Lab 01/17/14 0640 01/19/14 0916 01/21/14  WBC 10.9* 8.7 11.1*  HGB 10.1* 9.5* 9.5*  HCT 30.0* 28.9* 29.4*  MCV 94.3 94.8 96.1  PLT 318 317 310    Cardiac Enzymes: No results found for this basename: CKTOTAL, CKMB, CKMBINDEX, TROPONINI,  in the last 168 hours  BNP: No components found with this basename: POCBNP,   CBG:  Recent Labs Lab 01/22/14 0757 01/22/14 1214 01/22/14 1705 01/22/14 2104 01/23/14 0751  GLUCAP 112* 187* 150* 140* 168*     Microbiology: Results for orders placed during the hospital encounter of 01/15/14  URINE CULTURE     Status: None   Collection Time    01/15/14  6:08 PM      Result Value Ref Range Status   Specimen Description URINE, CLEAN CATCH   Final   Special Requests ADDED 1958   Final   Culture  Setup Time     Final   Value: 01/15/2014 20:37     Performed at [facility]     Final   Value: >=100,000 COLONIES/ML     Performed at [place]     Final   Value: ESCHERICHIA COLI     Performed at [facility]   Report Status 01/17/2014 FINAL   Final   Organism ID, Bacteria ESCHERICHIA COLI   Final    Coagulation Studies: No results found for this basename: LABPROT, INR,  in the last 72 hours  Urinalysis: No results found for this basename: COLORURINE, APPERANCEUR, LABSPEC, PHURINE, GLUCOSEU, HGBUR, BILIRUBINUR, KETONESUR, PROTEINUR, UROBILINOGEN, NITRITE, LEUKOCYTESUR,  in the last 72 hours    Imaging: Mr Cervical Spine Wo Contrast  01/23/2014   CLINICAL DATA:  Neck pain with left arm pain  EXAM:  MRI CERVICAL SPINE WITHOUT CONTRAST  TECHNIQUE: Multiplanar, multisequence MR imaging of the cervical spine was performed. No intravenous contrast was administered.  COMPARISON:  None.  FINDINGS: Normal cervical alignment with straightening of the cervical lordosis. Negative for fracture or mass. No spinal cord signal abnormality is identified. The patient has a relatively small spinal canal.  C2-3:  Mild degenerative change  C3-4: Moderately severe spinal stenosis with flattening of the cord. There is spondylosis and diffuse disc bulging.  C4-5: Moderate to severe spinal stenosis. Central disc protrusion with associated spondylosis. There is flattening of the cord right greater than left  C5-6: Disc degeneration and spondylosis with mild spinal stenosis. There is foraminal encroachment bilaterally  C6-7: Disc degeneration and spondylosis. Mild spinal  stenosis. Foraminal encroachment bilaterally  C7-T1:  Mild degenerative change without stenosis.  IMPRESSION: Multilevel cervical spondylosis. The patient has a congenitally small canal with superimposed spinal stenosis which is most severe at C3-4 and C4-5.   Electronically Signed   By: Franchot Gallo M.D.   On: 01/23/2014 08:16     Medications:     . aspirin EC  325 mg Oral Daily  . atorvastatin  30 mg Oral QHS  . folic acid  1 mg Oral Daily   And  . B-complex with vitamin C  1 tablet Oral Daily  . carbidopa-levodopa  0.5 tablet Oral TID  . cefTRIAXone (ROCEPHIN)  IV  1 g Intravenous Q24H  . darbepoetin  40 mcg Intravenous Q Tue-HD  . divalproex  1,500 mg Oral Daily  . docusate sodium  100 mg Oral QHS  . escitalopram  10 mg Oral Daily  . feeding supplement (NEPRO CARB STEADY)  237 mL Oral BID BM  . ferric gluconate (FERRLECIT/NULECIT) IV  125 mg Intravenous Q T,Th,Sa-HD  . gabapentin  300 mg Oral QHS  . insulin aspart  0-9 Units Subcutaneous TID WC  . lanthanum  2,000 mg Oral TID WC  . metoprolol tartrate  6.25 mg Oral BID   sodium chloride, sodium chloride, acetaminophen, feeding supplement (NEPRO CARB STEADY), heparin, HYDROmorphone (DILAUDID) injection, lidocaine (PF), lidocaine-prilocaine, nitroGLYCERIN, ondansetron, pantoprazole, pentafluoroprop-tetrafluoroeth, senna-docusate, simethicone  Assessment/ Plan:  1. AMS - sec to UTI, resolved 2. UTI - culture 5/3 + for E coli, on Rocephin.  3. LE pain - MRI of lumbar spine 5/4 showed severe spinal stenosis at L3-4 & L4-5 and L foraminal disc protrusion at L2-3, s/p lumbar epidural injection 4. ESRD  HD TTS 5. HTN/Volume - BP 112/65 on Metoprolol, no meds;  below EDW.  6. Anemia - Hgb 9.5, on Aranesp 40 mcg on Tues 7. 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 Use 2Ca bath. 8. Nutrition - Alb 2.6, carb-mod renal diet, vitamin.  9. DM - per primary 10. LUE weakness - MRI brain neg for acute CVA, MRI C-spine  pending for Left upper arm weakness     LOS: [place] @TODAY @9 :04 AM

## 2014-01-23 NOTE — Progress Notes (Signed)
Physical Therapy Treatment Patient Details Name: [name] [name] MRN: [id number] DOB: [birth year] Today's Date: Feb 07, 2014    History of Present Illness 73 year old male with a history of ESRD, PVD, L BKA with prosthesis, HTN, Sz d/o, DM, PAF, resected oligoastrocytoma presents with RLE pain and AMS. Pt with UTI and encephalopathy with CT(-)    PT Comments    Pt pleasant & agreeable to participate in therapy.  Pt ambulated ~150' with RW & min guard level.  Cont's to require assistance for safe mobility.   Follow Up Recommendations  SNF     Equipment Recommendations  3in1 (PT)    Recommendations for Other Services       Precautions / Restrictions Precautions Precautions: Fall Precaution Comments: L BKA with prosthesis Restrictions Weight Bearing Restrictions: No    Mobility  Bed Mobility Overal bed mobility: Needs Assistance Bed Mobility: Supine to Sit     Supine to sit: Supervision     General bed mobility comments: Incr time and use of rail  Transfers Overall transfer level: Needs assistance Equipment used: Rolling walker (2 wheeled) Transfers: Sit to/from Stand Sit to Stand: Min guard         General transfer comment: Pt braces back of LE's for stability.  Cues for body positioning before sitting in recliner  Ambulation/Gait Ambulation/Gait assistance: Min guard Ambulation Distance (Feet): 150 Feet Assistive device: Rolling walker (2 wheeled) Gait Pattern/deviations: Step-through pattern;Decreased stride length;Trunk flexed     General Gait Details: Pt relies heavily on RW with UE's.  Cues for posture & to stay closer to wakler.     Stairs            Wheelchair Mobility    Modified Rankin (Stroke Patients Only)       Balance                                    Cognition Arousal/Alertness: Awake/alert Behavior During Therapy: WFL for tasks assessed/performed Overall Cognitive Status: No family/caregiver present to determine  baseline cognitive functioning               Problem Solving: Slow processing;Decreased initiation;Difficulty sequencing;Requires verbal cues;Requires tactile cues      Exercises      General Comments        Pertinent Vitals/Pain No pain reported.     Home Living                      Prior Function            PT Goals (current goals can now be found in the care plan section) Acute Rehab PT Goals PT Goal Formulation: With patient/family Time For Goal Achievement: 01/30/14 Potential to Achieve Goals: Fair Progress towards PT goals: Progressing toward goals    Frequency  Min 3X/week    PT Plan Current plan remains appropriate    Co-evaluation             End of Session Equipment Utilized During Treatment: Gait belt Activity Tolerance: Patient tolerated treatment well Patient left: in chair;with call bell/phone within reach;with chair alarm set     Time: 8502-7741 PT Time Calculation (min): 18 min  Charges:  $Gait Training: 8-22 mins                    G Codes:      Sena Hitch 02-07-2014, 1:19 PM   Claiborne Billings  [place], [place] 01/23/2014

## 2014-01-23 NOTE — Progress Notes (Signed)
CSW (Clinical Education officer, museum) spoke with [name] [name] at facility. If pt is ready to dc today, facility will need dc summary/FL2 faxed and pt transport to be scheduled no later than 4pm.  [place], [place]

## 2014-01-23 NOTE — Progress Notes (Signed)
Patient ID: [name] [name], male   DOB: 03/13/1941, 73 y.o.   MRN: [id number] Discussed results of MRI of the cervical spine with the patient. He has significant cord compression at C3-4 and C4-5 secondary to chronic spondylosis. Weakness and left arm is fairly new starting the past week's time but at this time he feels that his left arm may be a bit stronger. I discussed with him surgical correction for the problem of spinal cord compression at C3-4 and C4-5 whether this would actually improve his functional capacity in that left arm would remain to be determined but I did note that he has active spinal cord compression there. There are no intrinsic cord signal changes that we can determine on the basis of this MRI. The patient opted to do further conservative therapy as he like to avoid surgery if at all possible. I feel this is reasonable and if he continues to improve his perhaps the best thing for him if however he has further deterioration and I believe a 2 level anterior decompression arthrodesis at C3-4 and C4-5 should be considered. I can follow patient up as an outpatient in approximately a month's time.

## 2014-01-23 NOTE — Discharge Summary (Signed)
Physician Discharge Summary  Patient ID: [name] [name] MRN: [id number] DOB/AGE: March 04, 1941 73 y.o.  Admit date: 01/15/2014 Discharge date: 01/23/2014  Primary Care Physician:  Donetta Potts, MD  Discharge Diagnoses:    . Escherichia coli UTI (lower urinary tract infection) . Acute encephalopathy resolved  . Lower extremity pain, right improved  Left upper extremity pain and weakness , due to cord compression at C3-4 and C4-C5  . ESRD (end stage renal disease) on dialysis . Paroxysmal atrial fibrillation  Consults:  Neurosurgery, Dr. Ellene Route                   Nephrology Dr. Jonnie Finner    Recommendations for Outpatient Follow-up:  Patient will need to followup with Dr. Ellene Route in 3-4 weeks in the office to discuss if he needs cervical spine surgery. At this time, patient wants to pursue conservative management with physical therapy and pain control for the left upper extremity weakness and pain.  He also needs to followup with urology in 2 weeks, Dr. Junious Silk for outpatient cystoscopy  Allergies:  No Known Allergies   Discharge Medications:   Medication List         acetaminophen 325 MG tablet  Commonly known as:  TYLENOL  Take 325 mg by mouth every 6 (six) hours as needed for moderate pain or fever.     aspirin EC 325 MG tablet  Take 325 mg by mouth daily.     atorvastatin 20 MG tablet  Commonly known as:  LIPITOR  Take 30 mg by mouth at bedtime.     carbidopa-levodopa 25-100 MG per tablet  Commonly known as:  SINEMET IR  Take 0.5 tablets by mouth 3 (three) times daily.     divalproex 500 MG DR tablet  Commonly known as:  DEPAKOTE  Take 1,500 mg by mouth daily.     docusate sodium 100 MG capsule  Commonly known as:  COLACE  Take 100 mg by mouth at bedtime.     escitalopram 10 MG tablet  Commonly known as:  LEXAPRO  Take 10 mg by mouth daily.     gabapentin 300 MG capsule  Commonly known as:  NEURONTIN  Take 300 mg by mouth at bedtime.     lanthanum  1000 MG chewable tablet  Commonly known as:  FOSRENOL  Chew 2,000 mg by mouth 3 (three) times daily with meals.     metoprolol tartrate 25 MG tablet  Commonly known as:  LOPRESSOR  Take 6.25 mg by mouth 2 (two) times daily. Take 6.25mg  by mouth daily     nitroGLYCERIN 0.4 MG SL tablet  Commonly known as:  NITROSTAT  Place 1 tablet (0.4 mg total) under the tongue every 5 (five) minutes x 3 doses as needed for chest pain.     omeprazole 20 MG tablet  Commonly known as:  PRILOSEC OTC  Take 20 mg by mouth daily as needed (heartburn).     oxyCODONE-acetaminophen 5-325 MG per tablet  Commonly known as:  PERCOCET/ROXICET  Take 1-2 tablets by mouth every 6 (six) hours as needed for severe pain.     RENO CAPS 1 MG Caps  Take 1 mg by mouth every evening. 8pm     senna-docusate 8.6-50 MG per tablet  Commonly known as:  Senokot-S  Take 1 tablet by mouth at bedtime as needed for mild constipation.     simethicone 80 MG chewable tablet  Commonly known as:  MYLICON  Chew 80 mg by mouth 2 (two) times  daily as needed for flatulence.         Brief H and P:[name] [name] is a 73 y.o. male with history of ESRD on hemodialysis on Tuesday Thursday and Saturday, diabetes mellitus type, peripheral vascular disease, left BKA, seizures was brought to the ER patient was complaining of persistent pain in the right lower extremity. Patient had come to the ER 2 days ago and had complained of right hip pain and x-ray showed osteoarthritis changes and patient was discharged to the rehabilitation on Percocet. Last 2 days patient has continued to have pain and patient states the pain radiates all the way to the right ankle. In the ER Dopplers negative for DVT but ABI was unable to be completed due to calcification. Patient was able to move extremities and has mild edema but no acute findings for ischemia. In addition patient's wife had noticed that patient has become increasingly confused talking out of his head  and lethargic last 2 days. In the ER UA shows features concerning for UTI. CT of the head is negative and patient has been on med for further management. Patient was recently admitted last month for diarrhea and transient aphasia.    Hospital Course:  The patient is a 73 year old male with history of ESRD diabetes, left BKA, seizures, PVD who had presented with right lower leg pain and confusion. Acute encephalopathy possibly due to the UTI, medication, resolved , patient is currently at his baseline mental status, pleasant and interactive. EEG was negative for epileptic disorder . MRI of the brain was negative for stroke.  Ammonia normal at 30. Valproic acid at 54. Percocet was held initially, patient was also treated for UTI. He has received 8 days of IV antibiotics, does not need any further antibiotics.  Left upper extremity weakness:  States improving MRI of the brain was negative for acute CVA, unchanged encephalomalacia in the right temporal from prior surgery. Neurosurgery was consulted and patient underwent MRI of the C-spine, which showed significant cortical pressure at C3-4 and C4-5 secondary to chronic spondylosis. Weakness in the left arm has been fairly new but is now improving. Dr. Ellene Route discussed in detail with the patient regarding surgical correction for the problem. There is no intrinsic cord signal changes on the MRI. After detailed discussion with neurosurgery however patient opted to do for the conservative management as he would like to avoid surgery if at all possible. Dr. Ellene Route suggested that if he has any further deterioration, 2 level anterior decompression arthrodesis at C3-4 and C4-5 be considered. Patient will followup with Dr. Ellene Route in 3-4 weeks for further management.  ESRD (end stage renal disease) on dialysis: TTS continue hemodialysis Tuesday Thursday and Saturday  Back pain with right lower extremity pain -improved  - Doppler: There is no DVT or SVT noted in the  right lower extremity.  - ABI; unable to ascertain ABI secondary to calcified vessels  - MRI of lumbar spine showed small left foraminal disc protrusion at L2-3, and spinal stenosis at multiple levels.  - Neurosurgery was consulted, seen by Dr. 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  - Continue pain control, status post epidural steroid injection 5/8 by interventional radiology   Escherichia coli UTI (lower urinary tract infection)  Patient was placed on IV Rocephin and has received 8 days of IV antibiotics in the hospitalization. He does not need any further  antibiotics upon discharge. Urine culture showed Escherichia coli. Urology was consulted and Dr Junious Silk with urology recommended out patient cystoscopy and PVR check.   Seizure disorder  - Continue Depakote      Day of Discharge BP 133/66  Pulse 60  Temp(Src) 97.4 F (36.3 C) (Oral)  Resp 18  Ht 6\' 4"  (1.93 m)  Wt 116.801 kg (257 lb 8 oz)  BMI 31.36 kg/m2  SpO2 97%  Physical Exam:   General: Alert and awake, oriented x3, not in any acute distress.  CVS: S1-S2 clear, no murmur rubs or gallops  Chest: clear to auscultation bilaterally, no wheezing, rales or rhonchi  Abdomen: soft nontender, nondistended, normal bowel sounds  Extremities left BKA, LUE 4/5     The results of significant diagnostics from this hospitalization (including imaging, microbiology, ancillary and laboratory) are listed below for reference.    LAB RESULTS: Basic Metabolic Panel:  Recent Labs Lab 01/19/14 0916 01/21/14 0140  NA 134* 134*  K 5.1 5.0  CL 91* 91*  CO2 24 27  GLUCOSE 103* 145*  BUN 65* 55*  CREATININE 10.18* 8.50*  CALCIUM 9.3 9.6  PHOS 5.7* 4.7*   Liver Function Tests:  Recent Labs Lab 01/19/14 0916 01/21/14 0140  ALBUMIN 2.4* 2.6*   No results found for this basename: LIPASE, AMYLASE,  in  the last 168 hours No results found for this basename: AMMONIA,  in the last 168 hours CBC:  Recent Labs Lab 01/19/14 0916 01/21/14  WBC 8.7 11.1*  HGB 9.5* 9.5*  HCT 28.9* 29.4*  MCV 94.8 96.1  PLT 317 310   Cardiac Enzymes: No results found for this basename: CKTOTAL, CKMB, CKMBINDEX, TROPONINI,  in the last 168 hours BNP: No components found with this basename: POCBNP,  CBG:  Recent Labs Lab 01/23/14 0751 01/23/14 1148  GLUCAP 168* 197*    Significant Diagnostic Studies:  Dg Chest 2 View  01/15/2014   CLINICAL DATA:  Leg swelling.  Lethargy.  EXAM: CHEST  2 VIEW  COMPARISON:  09/30/2013.  FINDINGS: Poor inspiration. Mild bibasilar atelectasis. Grossly normal sized heart. Thoracic spine degenerative changes.  IMPRESSION: Poor inspiration with mild bibasilar atelectasis.   Electronically Signed   By: Enrique Sack M.D.   On: 01/15/2014 14:36   Ct Head Wo Contrast  01/15/2014   CLINICAL DATA:  Head pain  EXAM: CT HEAD WITHOUT CONTRAST  TECHNIQUE: Contiguous axial images were obtained from the base of the skull through the vertex without intravenous contrast.  COMPARISON:  Brain MRI, 12/18/2013 Hand head CT, 09/08/13.  FINDINGS: Ventricles are normal in configuration. There is ventricular and sulcal enlargement reflecting mild to moderate atrophy  No parenchymal masses or mass effect. There is no evidence of a recent infarct. Encephalomalacia along the right temporal lobe is stable due to a previous right frontotemporal craniotomy.  No extra-axial masses or abnormal fluid collections.  There is no intracranial hemorrhage right craniotomy changes are stable. Minimal maxillary sinus mucosal thickening. Remaining sinuses and mastoid air cells are clear.  IMPRESSION: 1. No acute intracranial abnormalities. No change from the prior study.   Electronically Signed   By: Lajean Manes M.D.   On: 01/15/2014 19:10   Mr Brain Wo Contrast  01/16/2014   CLINICAL DATA:  Stroke  EXAM: MRI HEAD WITHOUT  CONTRAST  TECHNIQUE: Multiplanar, multiecho pulse sequences of the brain and surrounding structures were obtained without intravenous contrast.  COMPARISON:  MRI 12/18/2013.  CT 01/15/2014  FINDINGS: Prior right temporal craniotomy. Encephalomalacia  right temporal lobe is stable.  Mild atrophy.  Negative for hydrocephalus.  Negative for acute infarct.  Negative for mass lesion.  IMPRESSION: No acute abnormality.   Electronically Signed   By: Franchot Gallo M.D.   On: 01/16/2014 20:36   Mr Lumbar Spine Wo Contrast  01/16/2014   CLINICAL DATA:  Right lower extremity pain.  Dialysis patient.  EXAM: MRI LUMBAR SPINE WITHOUT CONTRAST  TECHNIQUE: Multiplanar, multisequence MR imaging of the lumbar spine was performed. No intravenous contrast was administered.  COMPARISON:  None.  FINDINGS: Negative for acute fracture or mass. Normal lumbar alignment. Conus medullaris is normal and terminates at L1-2.  Small gallstones are present.  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  L1-2: Bilateral facet hypertrophy with mild to moderate spinal stenosis  L2-3: Diffuse disc bulging bilateral facet hypertrophy and moderate spinal stenosis. Left foraminal disc protrusion is present  L3-4: Diffuse disc bulging and bilateral facet hypertrophy. Moderate to severe spinal stenosis. Bilateral foraminal narrowing  L4-5: Disc bulging and spondylosis. Central disc protrusion and moderate facet hypertrophy. Severe spinal stenosis. Lateral recess and foraminal encroachment bilaterally.  L5-S1: Disc bulging and spondylosis. Bilateral facet hypertrophy. Spinal stenosis and foraminal encroachment bilaterally of mild-to-moderate degree.  IMPRESSION: The patient has a congenitally small spinal canal. There is superimposed degenerative change and spinal stenosis at multiple levels. Spinal stenosis most severe at L3-4 and L4-5. Small left foraminal disc protrusion at L2-3.  Small gallstones.   Electronically Signed   By:  Franchot Gallo M.D.   On: 01/16/2014 20:41   US Renal  01/16/2014   CLINICAL DATA:  Recurrent UTI  EXAM: RENAL/URINARY TRACT ULTRASOUND COMPLETE  COMPARISON:  None.  FINDINGS: Right Kidney:  Length: 10.8 cm. There is mild increased echogenicity of the renal cortex. No mass or hydronephrosis.  Left Kidney:  Length: 12.2 cm. There is mild increased echogenicity of the renal cortex. No mass or hydronephrosis.  Bladder:  Appears normal for degree of bladder distention.  IMPRESSION: 1. No obstructive uropathy. 2. Mild increased renal cortical echogenicity as can be seen with medical renal disease.   Electronically Signed   By: Kathreen Devoid   On: 01/16/2014 19:18       Disposition and Follow-up: Discharge Orders   Future Orders Complete By Expires   Diet Carb Modified  As directed    Increase activity slowly  As directed    Vital signs  As directed        DISPOSITION: Back to ALF DIET: Renal diet  DISCHARGE FOLLOW-UP Follow-up Information   Follow up with Fredricka Bonine, MD. Schedule an appointment as soon as possible for a visit in 2 weeks. (in 2-3 weeks, for hospital follow-up)    Specialty:  Urology   Contact information:   [facility]  [place] [address] [phone]       Follow up with Earleen Newport, MD. Schedule an appointment as soon as possible for a visit in 3 weeks. (for hospital follow-up, to discuss if you need cervical spine surgery )    Specialty:  Neurosurgery   Contact information:   [address]  [place] [address] [phone]       Time spent on Discharge: 45 minutes  Signed:   Ripudeep Krystal Eaton M.D. [facility] 01/23/2014, 1:37 PM Pager: [phone]   **Disclaimer: This note was dictated with voice recognition software. Similar sounding words can inadvertently be transcribed and this  note may contain transcription errors which may not have been corrected upon publication of  note.**

## 2014-01-24 LAB — CBC
HCT: 32.1 % — ABNORMAL LOW (ref 39.0–52.0)
Hemoglobin: 10.8 g/dL — ABNORMAL LOW (ref 13.0–17.0)
MCH: 31.2 pg (ref 26.0–34.0)
MCHC: 33.6 g/dL (ref 30.0–36.0)
MCV: 92.8 fL (ref 78.0–100.0)
PLATELETS: 278 10*3/uL (ref 150–400)
RBC: 3.46 MIL/uL — ABNORMAL LOW (ref 4.22–5.81)
RDW: 14.7 % (ref 11.5–15.5)
WBC: 14.1 10*3/uL — AB (ref 4.0–10.5)

## 2014-01-24 LAB — RENAL FUNCTION PANEL
Albumin: 2.9 g/dL — ABNORMAL LOW (ref 3.5–5.2)
BUN: 82 mg/dL — ABNORMAL HIGH (ref 6–23)
CALCIUM: 9.5 mg/dL (ref 8.4–10.5)
CHLORIDE: 85 meq/L — AB (ref 96–112)
CO2: 24 mEq/L (ref 19–32)
Creatinine, Ser: 10.04 mg/dL — ABNORMAL HIGH (ref 0.50–1.35)
GFR, EST AFRICAN AMERICAN: 5 mL/min — AB (ref >90)
GFR, EST NON AFRICAN AMERICAN: 4 mL/min — AB (ref >90)
GLUCOSE: 197 mg/dL — AB (ref 70–99)
POTASSIUM: 5 meq/L (ref 3.7–5.3)
Phosphorus: 4.2 mg/dL (ref 2.3–4.6)
Sodium: 129 mEq/L — ABNORMAL LOW (ref 137–147)

## 2014-01-24 LAB — GLUCOSE, CAPILLARY: GLUCOSE-CAPILLARY: 180 mg/dL — AB (ref 70–99)

## 2014-01-24 MED ORDER — HEPARIN SODIUM (PORCINE) 1000 UNIT/ML DIALYSIS
8000.0000 [IU] | Freq: Once | INTRAMUSCULAR | Status: DC
  Filled 2014-01-24: qty 8

## 2014-01-24 MED ORDER — LIDOCAINE HCL (PF) 1 % IJ SOLN: 5.0000 mL | INTRAMUSCULAR | Status: DC | PRN

## 2014-01-24 MED ORDER — SODIUM CHLORIDE 0.9 % IV SOLN: 100.0000 mL | INTRAVENOUS | Status: DC | PRN

## 2014-01-24 MED ORDER — HEPARIN SODIUM (PORCINE) 1000 UNIT/ML DIALYSIS: 1000.0000 [IU] | INTRAMUSCULAR | Status: DC | PRN

## 2014-01-24 MED ORDER — LIDOCAINE-PRILOCAINE 2.5-2.5 % EX CREA: 1.0000 "application " | TOPICAL_CREAM | CUTANEOUS | Status: DC | PRN

## 2014-01-24 MED ORDER — DARBEPOETIN ALFA-POLYSORBATE 40 MCG/0.4ML IJ SOLN
INTRAMUSCULAR | Status: AC
  Filled 2014-01-24: qty 0.4

## 2014-01-24 MED ORDER — OXYCODONE-ACETAMINOPHEN 5-325 MG PO TABS
1.0000 | ORAL_TABLET | Freq: Four times a day (QID) | ORAL | Status: DC | PRN
Start: 2014-01-24 — End: 2014-05-15

## 2014-01-24 MED ORDER — NEPRO/CARBSTEADY PO LIQD
237.0000 mL | ORAL | Status: DC | PRN
Start: 2014-01-24 — End: 2014-01-24

## 2014-01-24 MED ORDER — ESCITALOPRAM OXALATE 10 MG PO TABS: 10.0000 mg | ORAL_TABLET | Freq: Every day | ORAL | Status: DC

## 2014-01-24 MED ORDER — DIVALPROEX SODIUM 500 MG PO DR TAB: 1500.0000 mg | DELAYED_RELEASE_TABLET | Freq: Every day | ORAL | Status: DC

## 2014-01-24 MED ORDER — ALTEPLASE 2 MG IJ SOLR
2.0000 mg | Freq: Once | INTRAMUSCULAR | Status: DC | PRN
  Filled 2014-01-24: qty 2

## 2014-01-24 MED ORDER — ALTEPLASE 2 MG IJ SOLR: 2.0000 mg | Freq: Once | INTRAMUSCULAR | Status: DC | PRN

## 2014-01-24 MED ORDER — NEPRO/CARBSTEADY PO LIQD: 237.0000 mL | ORAL | Status: DC | PRN

## 2014-01-24 MED ORDER — GI COCKTAIL ~~LOC~~
30.0000 mL | Freq: Once | ORAL | Status: AC
  Administered 2014-01-24: 30 mL via ORAL
  Filled 2014-01-24: qty 30

## 2014-01-24 MED ORDER — OXYCODONE-ACETAMINOPHEN 5-325 MG PO TABS: 1.0000 | ORAL_TABLET | Freq: Four times a day (QID) | ORAL | Status: DC | PRN

## 2014-01-24 MED ORDER — PENTAFLUOROPROP-TETRAFLUOROETH EX AERO: 1.0000 "application " | INHALATION_SPRAY | CUTANEOUS | Status: DC | PRN

## 2014-01-24 MED ORDER — SENNOSIDES-DOCUSATE SODIUM 8.6-50 MG PO TABS: 1.0000 | ORAL_TABLET | Freq: Every evening | ORAL | Status: DC | PRN

## 2014-01-24 NOTE — Procedures (Signed)
I have seen and examined this patient and agree with the plan of care , no complaints on dialysis  Sherril Croon 01/24/2014, 8:14 AM

## 2014-01-24 NOTE — Progress Notes (Signed)
Per MD- patient is medically stable for d/c today. Patient and wife want return to [facility]- where patient resides in the ALF.  Physical Therapy recommended SNF but per patient's request- facility will accept back for continued care and can provide PT services. Fl2 and d/c summary sent to facility and reviewed by [facility]; CSW received a call from [facility] that patient was approved to return to ALF today. EMS notified as patient's wife states that she cannot transport patient and facility cannot come and pick up patient. Nursing notified of above.  CSW will sign off.  Lorie Phenix. [place], [place]

## 2014-01-24 NOTE — Progress Notes (Signed)
Patient ID: [name] [name]  male  YWV:[id number]    DOB: September 24, 1940    DOA: 01/15/2014  PCP: Donetta Potts, MD   Please note DC summary was done yesterday 01/23/14, no changes  Assessment/Plan: Principal Problem:   Acute encephalopathy possibly due to the UTI, medication, resolved - EEG negative for epileptic disorder  - MRI brain negative for stroke.  - Ammonia normal at 30. Valproic acid at 54.  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 Dr. Ellene Route discussed in detail with the patient regarding surgical correction for the problem. There is no intrinsic cord signal changes on the MRI. After detailed discussion with neurosurgery however patient opted to do for the conservative management as he would like to avoid surgery if at all possible. Dr. Ellene Route suggested that if he has any further deterioration, 2 level anterior decompression arthrodesis at C3-4 and C4-5 be considered. Patient will followup with Dr. Ellene Route in 3-4 weeks for further management.     ESRD (end stage renal disease) on dialysis: TTS - Renal following for HD  Back pain with right lower extremity pain -improved - Doppler: There is no DVT or SVT noted in the right lower extremity. - ABI; unable to ascertain ABI secondary to calcified vessels - MRI of lumbar spine showed small left foraminal disc protrusion at L2-3, and spinal stenosis at multiple levels.  - Neurosurgery was consulted, seen by Dr. 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  - Continue pain control, status post ESI 5/8 by interventional radiology  Escherichia coli UTI (lower urinary tract infection) - Continue with  Ceftriaxone, no abx upon DC. -  Urine growing E coli. Dr Junious Silk with urology recommended out patient cystoscopy and PVR check  Seizure disorder - Continue Depakote   DVT Prophylaxis:  Code Status:  Family Communication: Patient alert and oriented x4, discussed in detail with the patient  Disposition: Hopefully today  Consultants:  Neurosurgeon   Urology  Nephrology  Intervention radiology  Procedures:  EEG  Epidural steroid injection 01/20/14  Antibiotics:  IV rocephin     Subjective:  Left upper extremity weakness improving, no acute issues overnight, HD today  Objective: Weight change: 0.363 kg (12.8 oz)  Intake/Output Summary (Last 24 hours) at 01/24/14 1254 Last data filed at 01/23/14 1722  Gross per 24 hour  Intake     75 ml  Output      0 ml  Net     75 ml   Blood pressure 110/66, pulse 70, temperature 97.5 F (36.4 C), temperature source Oral, resp. rate 18, height 6\' 4"  (1.93 m), weight 117.2 kg (258 lb 6.1 oz), SpO2 99.00%.  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   Lab Results: Basic Metabolic Panel:  Recent Labs Lab 01/21/14 0140 01/24/14 0846  NA 134* 129*  K 5.0 5.0  CL 91* 85*  CO2 27 24  GLUCOSE 145* 197*  BUN 55* 82*  CREATININE 8.50* 10.04*  CALCIUM 9.6 9.5  PHOS 4.7* 4.2   Liver Function Tests:  Recent Labs Lab 01/21/14 0140 01/24/14 0846  ALBUMIN 2.6* 2.9*   No results found for this basename: LIPASE, AMYLASE,  in the last 168 hours No results found for this basename: AMMONIA,  in the last 168 hours CBC:  Recent Labs Lab 01/21/14 01/24/14 0846  WBC 11.1* 14.1*  HGB 9.5* 10.8*  HCT 29.4* 32.1*  MCV 96.1 92.8  PLT 310 278   Cardiac Enzymes: No results found for this basename: CKTOTAL, CKMB, CKMBINDEX,  TROPONINI,  in the last 168 hours BNP: No components found with this basename: POCBNP,  CBG:  Recent Labs Lab 01/23/14 0751 01/23/14 1148 01/23/14 1612 01/23/14 2128 01/24/14 0746  GLUCAP 168* 197* 212* 187* 180*     Micro Results: Recent Results (from the past 240 hour(s))  URINE CULTURE     Status: None   Collection Time    01/15/14  6:08 PM      Result Value Ref Range Status   Specimen Description URINE, CLEAN CATCH   Final   Special Requests ADDED 1958   Final   Culture  Setup Time     Final   Value: 01/15/2014 20:37     Performed at [facility]     Final   Value: >=100,000 COLONIES/ML     Performed at [facility]   Culture     Final   Value: ESCHERICHIA COLI     Performed at [facility]   Report Status 01/17/2014 FINAL   Final   Organism ID, Bacteria ESCHERICHIA COLI   Final    Studies/Results: Dg Chest 2 View  01/15/2014   CLINICAL DATA:  Leg swelling.  Lethargy.  EXAM: CHEST  2 VIEW  COMPARISON:  09/30/2013.  FINDINGS: Poor inspiration. Mild bibasilar atelectasis. Grossly normal sized heart. Thoracic spine degenerative changes.  IMPRESSION: Poor inspiration with mild bibasilar atelectasis.   Electronically Signed   By: Enrique Sack M.D.   On: 01/15/2014 14:36   Dg Hip Complete Right  01/13/2014   CLINICAL DATA:  HIP PAIN  EXAM: RIGHT HIP - COMPLETE 2+ VIEW  COMPARISON:  None.  FINDINGS: There is no evidence of hip fracture or dislocation. Areas of hypertrophic spurring and periarticular sclerosis involving the femoral head and acetabulum. Atherosclerotic calcifications are appreciated. Soft tissues otherwise unremarkable. There is scattered areas of retained contrast within the colon greatest area of confluence in the rectosigmoid region.  IMPRESSION: Osteoarthritic changes without acute osseous abnormalities.   Electronically Signed   By: Margaree Mackintosh M.D.   On: 01/13/2014 12:21   Ct Head Wo Contrast  01/15/2014   CLINICAL  DATA:  Head pain  EXAM: CT HEAD WITHOUT CONTRAST  TECHNIQUE: Contiguous axial images were obtained from the base of the skull through the vertex without intravenous contrast.  COMPARISON:  Brain MRI, 12/18/2013 Hand head CT, 09/08/13.  FINDINGS: Ventricles are normal in configuration. There is ventricular and sulcal enlargement reflecting mild to moderate atrophy  No parenchymal masses or mass effect. There is no evidence of a recent infarct. Encephalomalacia along the right temporal lobe is stable due to a previous right frontotemporal craniotomy.  No extra-axial masses or abnormal fluid collections.  There is no intracranial hemorrhage right craniotomy changes are stable. Minimal maxillary sinus mucosal thickening. Remaining sinuses and mastoid air cells are clear.  IMPRESSION: 1. No acute intracranial abnormalities. No change from the prior study.   Electronically Signed   By: Lajean Manes M.D.   On: 01/15/2014 19:10  Mr Brain Wo Contrast  01/16/2014   CLINICAL DATA:  Stroke  EXAM: MRI HEAD WITHOUT CONTRAST  TECHNIQUE: Multiplanar, multiecho pulse sequences of the brain and surrounding structures were obtained without intravenous contrast.  COMPARISON:  MRI 12/18/2013.  CT 01/15/2014  FINDINGS: Prior right temporal craniotomy. Encephalomalacia right temporal lobe is stable.  Mild atrophy.  Negative for hydrocephalus.  Negative for acute infarct.  Negative for mass lesion.  IMPRESSION: No acute abnormality.   Electronically Signed   By: Franchot Gallo M.D.   On: 01/16/2014 20:36   Mr Lumbar Spine Wo Contrast  01/16/2014   CLINICAL DATA:  Right lower extremity pain.  Dialysis patient.  EXAM: MRI LUMBAR SPINE WITHOUT CONTRAST  TECHNIQUE: Multiplanar, multisequence MR imaging of the lumbar spine was performed. No intravenous contrast was administered.  COMPARISON:  None.  FINDINGS: Negative for acute fracture or mass. Normal lumbar alignment. Conus medullaris is normal and terminates at L1-2.  Small gallstones  are present.  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  L1-2: Bilateral facet hypertrophy with mild to moderate spinal stenosis  L2-3: Diffuse disc bulging bilateral facet hypertrophy and moderate spinal stenosis. Left foraminal disc protrusion is present  L3-4: Diffuse disc bulging and bilateral facet hypertrophy. Moderate to severe spinal stenosis. Bilateral foraminal narrowing  L4-5: Disc bulging and spondylosis. Central disc protrusion and moderate facet hypertrophy. Severe spinal stenosis. Lateral recess and foraminal encroachment bilaterally.  L5-S1: Disc bulging and spondylosis. Bilateral facet hypertrophy. Spinal stenosis and foraminal encroachment bilaterally of mild-to-moderate degree.  IMPRESSION: The patient has a congenitally small spinal canal. There is superimposed degenerative change and spinal stenosis at multiple levels. Spinal stenosis most severe at L3-4 and L4-5. Small left foraminal disc protrusion at L2-3.  Small gallstones.   Electronically Signed   By: Franchot Gallo M.D.   On: 01/16/2014 20:41   US Renal  01/16/2014   CLINICAL DATA:  Recurrent UTI  EXAM: RENAL/URINARY TRACT ULTRASOUND COMPLETE  COMPARISON:  None.  FINDINGS: Right Kidney:  Length: 10.8 cm. There is mild increased echogenicity of the renal cortex. No mass or hydronephrosis.  Left Kidney:  Length: 12.2 cm. There is mild increased echogenicity of the renal cortex. No mass or hydronephrosis.  Bladder:  Appears normal for degree of bladder distention.  IMPRESSION: 1. No obstructive uropathy. 2. Mild increased renal cortical echogenicity as can be seen with medical renal disease.   Electronically Signed   By: Kathreen Devoid   On: 01/16/2014 19:18    Medications: Scheduled Meds: . aspirin EC  325 mg Oral Daily  . atorvastatin  30 mg Oral QHS  . folic acid  1 mg Oral Daily   And  . B-complex with vitamin C  1 tablet Oral Daily  . carbidopa-levodopa  0.5 tablet Oral TID  . cefTRIAXone  (ROCEPHIN)  IV  1 g Intravenous Q24H  . darbepoetin      . darbepoetin  40 mcg Intravenous Q Tue-HD  . divalproex  1,500 mg Oral Daily  . docusate sodium  100 mg Oral QHS  . escitalopram  10 mg Oral Daily  . feeding supplement (NEPRO CARB STEADY)  237 mL Oral BID BM  . ferric gluconate (FERRLECIT/NULECIT) IV  125 mg Intravenous Q T,Th,Sa-HD  . gabapentin  300 mg Oral QHS  . heparin  8,000 Units Dialysis Once in dialysis  . insulin aspart  0-9 Units Subcutaneous TID WC  . lanthanum  2,000 mg Oral TID WC  . metoprolol  tartrate  6.25 mg Oral BID      LOS: 9 days   Ripudeep Krystal Eaton M.D. [facility] 01/24/2014, 12:54 PM Pager: [phone]  If 7PM-7AM, please contact night-coverage [email or web address] Password [email or web address]  **Disclaimer: This note was dictated with voice recognition software. Similar sounding words can inadvertently be transcribed and this note may contain transcription errors which may not have been corrected upon publication of note.**

## 2014-02-14 ENCOUNTER — Ambulatory Visit: Payer: Medicare Other | Admitting: Diagnostic Neuroimaging

## 2014-04-17 ENCOUNTER — Ambulatory Visit: Payer: Medicare Other | Admitting: Diagnostic Neuroimaging

## 2014-05-02 ENCOUNTER — Encounter: Payer: Self-pay | Admitting: Diagnostic Neuroimaging

## 2014-05-15 ENCOUNTER — Encounter: Payer: Self-pay | Admitting: Diagnostic Neuroimaging

## 2014-05-15 ENCOUNTER — Ambulatory Visit (INDEPENDENT_AMBULATORY_CARE_PROVIDER_SITE_OTHER): Payer: Medicare Other | Admitting: Diagnostic Neuroimaging

## 2014-05-15 VITALS — BP 161/79 | HR 74 | Temp 97.5°F | Ht 76.0 in | Wt 274.0 lb

## 2014-05-15 DIAGNOSIS — R251 Tremor, unspecified: Secondary | ICD-10-CM

## 2014-05-15 DIAGNOSIS — G40209 Localization-related (focal) (partial) symptomatic epilepsy and epileptic syndromes with complex partial seizures, not intractable, without status epilepticus: Secondary | ICD-10-CM

## 2014-05-15 DIAGNOSIS — R259 Unspecified abnormal involuntary movements: Secondary | ICD-10-CM

## 2014-05-15 NOTE — Progress Notes (Signed)
PATIENT: [name] [name] DOB: 01/21/1941   REASON FOR VISIT: follow up for complex partial seizures HISTORY FROM: patient  HISTORY OF PRESENT ILLNESS:  UPDATE 05/15/14: Since last visit, carb/levo has helped his tremor. Then in May 2015, developed confusion, encephalopathy, left arm weakness, right leg pain. Has severe cervical and lumbar spine stenosis (opted for conservative mgmt and PT). Also had UTI. Now doing better.  Last Wed, had left > right arm shaking (39mnutes), no LOC, no lower ext shaking.   UPDATE 09/22/13: Since last visit, has started to develop action and postural, occ resting, tremor in right > left hand. He discussed with renal doctor who recommended follow up here. No seizures.  UPDATE 08/16/13 (LL): Since last visit, patient was having chest pain and had Left heart Cardiac catheterization 05/27/13 which demonstrated normal coronary arteries.  He was later taken to the emergency room for evaluation of possible TIA or stroke on 06/19/13. Patient had intermittent episodes of shaking of his extremities on day of admission. During one of those episodes he had a 15 minute period of slurred speech which was noticed by his wife. Symptoms lasted about 15 minutes then resolved with no recurrence, even though he's had recurrent episodes of shaking of his extremities.  Stroke workup was negative.  His valproic acid level was low and he was increased to divalproex ER 15052mdaily for seizure control.  Just started rehab last week at his assisted living facility, twice a week for walking/balance.  Looking back over the last year retrospectively patient feels like discord between his wife and himself caused many of his physical complaints.  He has no seizures or other shaking/ neuro symptoms since leaving the hospital. They are living apart now and he feels the best he has felt in a long time, he states.  UPDATE 03/30/13 (VP): Since last visit, dilantin level was very low, so I started divalproex  ER 100058maily, and then tapered dilantin off. He is doing much better now. No further seizures. He has some periodic limb movements of sleep type kicks at night. No day time seizures. Short term memory still affected. Also went to ER in June for fever, found to have UTI.   UPDATE 02/09/13 (VP): Since last visit, still having 3-4 episodes per week, with slurred speech, random jerking movements, but cannot talk, but able to hear and stay awake. Episodes do not seem to be correlated with dialysis timing. Patient did not bring his medicines for me to review. Is not sure about his Dilantin dosing, but he thinks he takes 50 mg tablets, 10 tablets every morning. [facility]/12, which shows stable right temporal post surgical changes, and is stable from MRI on 01/07/11. He did not see them in clinic.   Also, I received prior records, which I summarize as follows:  Patient had 2 focal seizures in January 2009. These initially consisted of feeling off balance, disorientation, difficulty speaking, left-sided numbness. He [facility] stereot[facility] demonstrating a well-differentiated astrocytoma. [facility]. He underwent right temporal lobe tumor resection (11/09/07, Dr. [facility]aybon [facility] pathology demonstrated oligoastrocytoma. He then [facility] up [facility]. Additional pathology studies: Ki-67 4%, MGMT 25% (MGMT promoter methylation negative), 1p intact, 19q loss. No further radiation or chemotherapy was suggested. Followup MRIs were recommended over time. He follow up in 2010 multiple times with  stable MRIs, then not again in [facility] until 07/15/12. Last 2 MRIs were 01/07/11 and 11/10/12, which have been stable. Has not been back to [facility] for clinic evaluation since 07/15/12.   PRIOR HPI (10/22/12): 73 year old right-handed male  with history of diabetes, paroxysmal atrial fibrillation, gout, end-stage renal disease on hemodialysis, depression, anxiety, brain tumor (patient not sure what type) here for evaluation of slurred speech trouble talking, trouble swallowing and ringing in the ears. Patient is here with his wife. Patient previously lived in [place], treated at [facility] for a brain tumor. He points to the right frontal region of his head. Is not sure if this was a benign or malignant tumor. Patient states that he was having left leg weakness problems, diagnosed with a brain tumor. Apparently this was treated at [facility] with biopsy and ultimately resection. He does not recall chemotherapy or radiation treatments. He has had followup scans and visits at [facility] with neurosurgery and neurology but does not know with whom. Last MRI scan was in April 2012. Last visit with neurologist was 4 months ago. Patient also reports history of seizures, around the time of diagnosis of brain tumor. Patient describes seizures as episodes where he is unable to talk, foaming at the mouth, but awake, aware, able to hear. No convulsions. Patient has been treated with Dilantin. He reports taking 10 tablets of Dilantin the morning and 5 tablets at night. This is contradictory to his referring notes where he is apparently on 100 mg 3 times a day. Patient is not sure of the strength of his tablets.   REVIEW OF SYSTEMS: Full 14 system review of systems performed and notable only for as per HPI.   ALLERGIES: No Known Allergies  HOME MEDICATIONS: Outpatient Prescriptions Prior to Visit  Medication Sig Dispense Refill  . acetaminophen (TYLENOL) 325 MG tablet Take 325 mg by mouth every 6 (six) hours as needed for moderate pain or fever.      Marland Kitchen aspirin EC 325 MG tablet Take 325 mg by mouth daily.      Marland Kitchen atorvastatin (LIPITOR) 20 MG tablet Take 30 mg by mouth at bedtime.      . B Complex-C-Folic Acid (RENO CAPS) 1 MG CAPS Take 1 mg by  mouth every evening. 8pm      . carbidopa-levodopa (SINEMET IR) 25-100 MG per tablet Take 0.5 tablets by mouth 3 (three) times daily.      . divalproex (DEPAKOTE) 500 MG DR tablet Take 3 tablets (1,500 mg total) by mouth daily.  30 tablet  0  . escitalopram (LEXAPRO) 10 MG tablet Take 1 tablet (10 mg total) by mouth daily.  30 tablet  0  . gabapentin (NEURONTIN) 300 MG capsule Take 300 mg by mouth 2 (two) times daily.       . metoprolol tartrate (LOPRESSOR) 25 MG tablet Take 6.25 mg by mouth 2 (two) times daily. Take 6.20m by mouth daily      . nitroGLYCERIN (NITROSTAT) 0.4 MG SL tablet Place 1 tablet (0.4 mg total) under the tongue every 5 (five) minutes x 3 doses as needed for chest pain.  30 tablet  12  . omeprazole (PRILOSEC OTC) 20 MG tablet Take 20 mg by mouth daily as needed (heartburn).       . senna-docusate (SENOKOT-S) 8.6-50 MG per tablet Take 1 tablet by mouth at bedtime as needed for mild constipation.  30 tablet  0  . simethicone (MYLICON) 80 MG chewable tablet  Chew 80 mg by mouth 2 (two) times daily as needed for flatulence.      . docusate sodium (COLACE) 100 MG capsule Take 100 mg by mouth at bedtime.       Marland Kitchen lanthanum (FOSRENOL) 1000 MG chewable tablet Chew 2,000 mg by mouth 3 (three) times daily with meals.      Marland Kitchen oxyCODONE-acetaminophen (PERCOCET/ROXICET) 5-325 MG per tablet Take 1-2 tablets by mouth every 6 (six) hours as needed for severe pain.  30 tablet  0   No facility-administered medications prior to visit.     PAST MEDICAL HISTORY: Past Medical History  Diagnosis Date  . ESRD on hemodialysis     Started HD around 2009, maybe longer.  Was living in [place], [place] then.  Moved to [place] in 2014 and gets HD now at [facility] on [address] on a TTS schedule.  ESRD was due to DM and HTN.     Marland Kitchen Peripheral vascular disease   . Hypertension   . Hyperlipidemia   . Arthritis     Gout  . Paroxysmal atrial fibrillation   . Brain tumor 2009  . Seizure disorder 2009    . Anemia   . Hyperparathyroidism, secondary   . Diabetes mellitus without complication     Type II, diet controlled  . Proteus mirabilis infection   . CHF (congestive heart failure)     grade 1 DD, preserved EF, [facility] records  . 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 brain tumor  . GERD (gastroesophageal reflux disease)   . Hx of cardiac cath     a. LHC (9/14):  Normal cors, EF 65%.     PAST SURGICAL HISTORY: Past Surgical History  Procedure Laterality Date  . Av fistula placement Left     arm  . 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 Eye surgery Bilateral 2000    cataract  . Retinopathy surgery      Hx. of laser treatments for diabetics    FAMILY HISTORY: Family History  Problem Relation Age of Onset  . Seizures Mother   . Stroke Mother 63  . Hypertension Mother   . Alcohol abuse Father   . Hypertension Father   . Arthritis Sister   . Heart attack Neg Hx   . Heart failure Neg Hx     SOCIAL HISTORY: History   Social History  . Marital Status: Married    Spouse Name: [name]    Number of Children: 3  . Years of Education: 12th   Occupational History  .      N/A   Social History Main Topics  . Smoking status: Never Smoker   . Smokeless tobacco: Never Used  . Alcohol Use: 0.6 oz/week    1 Shots of liquor per week  . Drug Use: No  . Sexual Activity: No   Other Topics Concern  . Not on file   Social History Narrative   Pt lives at home with spouse.   Caffeine Use: 1-2 cups daily.   Uses a cane for ambulation and prosthetic limb  Left leg.       PHYSICAL EXAM  Filed Vitals:   05/15/14 1432  BP: 161/79  Pulse: 74  Temp: 97.5 F (36.4 C)  TempSrc: Oral  Height: 6' 4"  (1.93 m)  Weight: 274 lb (124.286 kg)   Body mass index is 33.37  kg/(m^2).  Generalized: Well developed, in no acute distress  Neck: Supple, no carotid bruits  Cardiac:  Regular rate rhythm, no murmur  Musculoskeletal: LEFT BELOW KNEE AMPUTATION.   Neurologic Exam  Mental Status: Awake, alert. Language is fluent and comprehension intact.  Cranial Nerves:  Pupils are equal and reactive to light. Visual fields are full to confrontation, EXCEPT LEFT SUPERIOR QUADRANTANOPSIA. Conjugate eye movements are full and symmetric. Facial sensation and strength are symmetric. Hearing is intact. Palate elevated symmetrically and uvula is midline. Shoulder shrug is symmetric. Tongue is midline.  Motor: ACTION > POSTURAL TREMOR (LUE > RUE). INTERMITTENT MOUTH/CHIN TREMOR. MILD BRADYKINESIA IN BUE. Full strength in the upper and lower extremities. LEFT BELOW KNEE AMPUTATION. No pronator drift.  Sensory: DECR VIB AT TOES.  Coordination: No ataxia or dysmetria on finger-nose or rapid alternating movement testing.  Gait and Station: UNSTEADY GAIT, ambulates with cane, has left leg prosthesis. Reflexes: BUE TRACE, RIGHT KNEE TRACE, RIGHT ANKLE 0. LEFT BELOW KNEE AMPUTATION.  DIAGNOSTIC DATA (LABS, IMAGING, TESTING) - I reviewed patient records, labs, notes, testing and imaging myself where available.  Lab Results  Component Value Date   WBC 14.1* 01/24/2014   HGB 10.8* 01/24/2014   HCT 32.1* 01/24/2014   MCV 92.8 01/24/2014   PLT 278 01/24/2014      Component Value Date/Time   NA 129* 01/24/2014 0846   NA 138 02/09/2013 1350   K 5.0 01/24/2014 0846   CL 85* 01/24/2014 0846   CO2 24 01/24/2014 0846   GLUCOSE 197* 01/24/2014 0846   GLUCOSE 137* 02/09/2013 1350   BUN 82* 01/24/2014 0846   BUN 25 02/09/2013 1350   CREATININE 10.04* 01/24/2014 0846   CALCIUM 9.5 01/24/2014 0846   PROT 7.1 01/16/2014 0405   PROT 6.8 02/09/2013 1350   ALBUMIN 2.9* 01/24/2014 0846   AST 17 01/16/2014 0405   ALT <5 01/16/2014 0405   ALKPHOS 56 01/16/2014 0405   BILITOT 0.2* 01/16/2014 0405   GFRNONAA 4* 01/24/2014 0846   GFRAA 5* 01/24/2014 0846   Lab Results  Component Value Date   CHOL 123 01/16/2014   HDL 32*  01/16/2014   LDLCALC 66 01/16/2014   TRIG 125 01/16/2014   CHOLHDL 3.8 01/16/2014   Lab Results  Component Value Date   HGBA1C 5.8* 01/16/2014   Lab Results  Component Value Date   PHENYTOIN 3.1* 02/15/2013   VALPROATE 54.9 01/15/2014    03/17/13 EEG - moderate diffuse slowing with intermittent frontal rhythmic activity (FIRDA) consistent with moderate encephalopathy. No focal or epileptiform activity is seen. No electrographic seizures are recorded.   01/16/14 EEG - mild to moderate generalized continuous nonspecific slowing of cerebral activity. This pattern of slowing can be seen with degenerative as well as metabolic and toxic encephalopathies. No evidence of an epileptic disorder was demonstrated.  01/20/14 MRI brain - nothing acute; prior right temporal craniotomy with encephalomalacia in the right temporal lobe which is unchanged. There is generalized atrophy.  01/23/14 MRI cervical spine - Multilevel cervical spondylosis. The patient has a congenitally small canal with superimposed spinal stenosis which is most severe at C3-4 and C4-5.  01/16/14 MRI lumbar spine - The patient has a congenitally small spinal canal. There is superimposed degenerative change and spinal stenosis at multiple levels. Spinal stenosis most severe at L3-4 and L4-5. Small left foraminal disc protrusion at L2-3.   ASSESSMENT AND PLAN  73 y.o. male with intermittent slurrred speech, jerking movements, ringing in ears, could be partial seizures.  Much better controlled with divalproex than with dilantin. 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 Continue divalproex ER 1540m daily for seizure control  2. Continue carb/levo for tremor control 3. Follow up with Dr. EEllene Routefor cervical and lumbar spinal stenosis  Return in about 6 months (around  11/13/2014).    VPenni Bombard MD 86/15/3794 33:27PM Certified in Neurology, Neurophysiology and Neuroimaging  [facility] [address] [place] [phone]

## 2014-05-15 NOTE — Patient Instructions (Signed)
Continue carbidopa-levodopa.   Continue divalproex.

## 2014-05-18 ENCOUNTER — Emergency Department (HOSPITAL_COMMUNITY)
Admission: EM | Admit: 2014-05-18 | Discharge: 2014-05-18 | Disposition: A | Discharge status: Home / Self Care | Payer: Medicare Other | Attending: Emergency Medicine | Admitting: Emergency Medicine

## 2014-05-18 ENCOUNTER — Encounter (HOSPITAL_COMMUNITY): Payer: Self-pay | Admitting: Emergency Medicine

## 2014-05-18 DIAGNOSIS — Z79899 Other long term (current) drug therapy: Secondary | ICD-10-CM | POA: Diagnosis not present

## 2014-05-18 DIAGNOSIS — Z87448 Personal history of other diseases of urinary system: Secondary | ICD-10-CM | POA: Insufficient documentation

## 2014-05-18 DIAGNOSIS — I120 Hypertensive chronic kidney disease with stage 5 chronic kidney disease or end stage renal disease: Secondary | ICD-10-CM | POA: Diagnosis not present

## 2014-05-18 DIAGNOSIS — Z992 Dependence on renal dialysis: Secondary | ICD-10-CM | POA: Insufficient documentation

## 2014-05-18 DIAGNOSIS — Z8619 Personal history of other infectious and parasitic diseases: Secondary | ICD-10-CM | POA: Insufficient documentation

## 2014-05-18 DIAGNOSIS — Z862 Personal history of diseases of the blood and blood-forming organs and certain disorders involving the immune mechanism: Secondary | ICD-10-CM | POA: Diagnosis not present

## 2014-05-18 DIAGNOSIS — E119 Type 2 diabetes mellitus without complications: Secondary | ICD-10-CM | POA: Diagnosis not present

## 2014-05-18 DIAGNOSIS — M109 Gout, unspecified: Secondary | ICD-10-CM | POA: Insufficient documentation

## 2014-05-18 DIAGNOSIS — Z9889 Other specified postprocedural states: Secondary | ICD-10-CM | POA: Diagnosis not present

## 2014-05-18 DIAGNOSIS — E785 Hyperlipidemia, unspecified: Secondary | ICD-10-CM | POA: Diagnosis not present

## 2014-05-18 DIAGNOSIS — T82590A Other mechanical complication of surgically created arteriovenous fistula, initial encounter: Secondary | ICD-10-CM

## 2014-05-18 DIAGNOSIS — Y832 Surgical operation with anastomosis, bypass or graft as the cause of abnormal reaction of the patient, or of later complication, without mention of misadventure at the time of the procedure: Secondary | ICD-10-CM | POA: Insufficient documentation

## 2014-05-18 DIAGNOSIS — I509 Heart failure, unspecified: Secondary | ICD-10-CM | POA: Diagnosis not present

## 2014-05-18 DIAGNOSIS — I4891 Unspecified atrial fibrillation: Secondary | ICD-10-CM | POA: Insufficient documentation

## 2014-05-18 DIAGNOSIS — T82898A Other specified complication of vascular prosthetic devices, implants and grafts, initial encounter: Secondary | ICD-10-CM

## 2014-05-18 DIAGNOSIS — N186 End stage renal disease: Secondary | ICD-10-CM | POA: Insufficient documentation

## 2014-05-18 DIAGNOSIS — K219 Gastro-esophageal reflux disease without esophagitis: Secondary | ICD-10-CM | POA: Insufficient documentation

## 2014-05-18 DIAGNOSIS — Z85841 Personal history of malignant neoplasm of brain: Secondary | ICD-10-CM | POA: Diagnosis not present

## 2014-05-18 DIAGNOSIS — Z8669 Personal history of other diseases of the nervous system and sense organs: Secondary | ICD-10-CM | POA: Insufficient documentation

## 2014-05-18 DIAGNOSIS — T82598A Other mechanical complication of other cardiac and vascular devices and implants, initial encounter: Secondary | ICD-10-CM | POA: Diagnosis not present

## 2014-05-18 LAB — CBC WITH DIFFERENTIAL/PLATELET
BASOS ABS: 0 10*3/uL (ref 0.0–0.1)
BASOS PCT: 0 % (ref 0–1)
EOS PCT: 3 % (ref 0–5)
Eosinophils Absolute: 0.2 10*3/uL (ref 0.0–0.7)
HCT: 32.2 % — ABNORMAL LOW (ref 39.0–52.0)
HEMOGLOBIN: 10.8 g/dL — AB (ref 13.0–17.0)
Lymphocytes Relative: 32 % (ref 12–46)
Lymphs Abs: 2.4 10*3/uL (ref 0.7–4.0)
MCH: 33.2 pg (ref 26.0–34.0)
MCHC: 33.5 g/dL (ref 30.0–36.0)
MCV: 99.1 fL (ref 78.0–100.0)
Monocytes Absolute: 0.7 10*3/uL (ref 0.1–1.0)
Monocytes Relative: 9 % (ref 3–12)
Neutro Abs: 4.1 10*3/uL (ref 1.7–7.7)
Neutrophils Relative %: 56 % (ref 43–77)
Platelets: 285 10*3/uL (ref 150–400)
RBC: 3.25 MIL/uL — ABNORMAL LOW (ref 4.22–5.81)
RDW: 14.8 % (ref 11.5–15.5)
WBC: 7.4 10*3/uL (ref 4.0–10.5)

## 2014-05-18 LAB — BASIC METABOLIC PANEL
Anion gap: 20 — ABNORMAL HIGH (ref 5–15)
BUN: 70 mg/dL — AB (ref 6–23)
CALCIUM: 9.6 mg/dL (ref 8.4–10.5)
CHLORIDE: 94 meq/L — AB (ref 96–112)
CO2: 26 meq/L (ref 19–32)
Creatinine, Ser: 10.25 mg/dL — ABNORMAL HIGH (ref 0.50–1.35)
GFR calc Af Amer: 5 mL/min — ABNORMAL LOW (ref >90)
GFR calc non Af Amer: 4 mL/min — ABNORMAL LOW (ref >90)
GLUCOSE: 203 mg/dL — AB (ref 70–99)
Potassium: 5.6 mEq/L — ABNORMAL HIGH (ref 3.7–5.3)
Sodium: 140 mEq/L (ref 137–147)

## 2014-05-18 NOTE — Consult Note (Signed)
Agree with above.  Deitra Mayo, MD, [place] [phone] 05/18/2014

## 2014-05-18 NOTE — ED Provider Notes (Signed)
CSN: [id number]     Arrival date & time 05/18/14  1314 History   First MD Initiated Contact with Patient 05/18/14 1332     Chief Complaint  Patient presents with  . Vascular Access Problem    bleeding L dialysis fistula      (Consider location/radiation/quality/duration/timing/severity/associated sxs/prior Treatment) The history is provided by the patient.   The patient's graft, left upper arm started bleeding, prior to dialysis today. His regular dialysis, was cancelled, and he was transferred here for evaluation. He was rescheduled for tomorrow morning. He denies weakness, dizziness, nausea, or vomiting. There are no other known modifying factors.  Past Medical History  Diagnosis Date  . ESRD on hemodialysis     Started HD around 2009, maybe longer.  Was living in [place], [place] then.  Moved to [place] in 2014 and gets HD now at [facility] on [address] on a TTS schedule.  ESRD was due to DM and HTN.     Marland Kitchen Peripheral vascular disease   . Hypertension   . Hyperlipidemia   . Arthritis     Gout  . Paroxysmal atrial fibrillation   . Brain tumor 2009  . Seizure disorder 2009  . Anemia   . Hyperparathyroidism, secondary   . Diabetes mellitus without complication     Type II, diet controlled  . Proteus mirabilis infection   . CHF (congestive heart failure)     grade 1 DD, preserved EF, [facility] records  . 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 brain tumor  . GERD (gastroesophageal reflux disease)   . Hx of cardiac cath     a. LHC (9/14):  Normal cors, EF 65%.    Past Surgical History  Procedure Laterality Date  . Av fistula placement Left     arm  . 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 Eye surgery Bilateral 2000    cataract  . Retinopathy surgery      Hx. of laser treatments for diabetics   Family History  Problem Relation Age of Onset  . Seizures Mother   . Stroke  Mother 40  . Hypertension Mother   . Alcohol abuse Father   . Hypertension Father   . Arthritis Sister   . Heart attack Neg Hx   . Heart failure Neg Hx    History  Substance Use Topics  . Smoking status: Never Smoker   . Smokeless tobacco: Never Used  . Alcohol Use: 0.6 oz/week    1 Shots of liquor per week    Review of Systems  All other systems reviewed and are negative.     Allergies  Review of patient's allergies indicates no known allergies.  Home Medications   Prior to Admission medications   Medication Sig Start Date End Date Taking? Authorizing Provider  acetaminophen (TYLENOL) 325 MG tablet Take 325 mg by mouth every 6 (six) hours as needed for moderate pain or fever.   Yes Historical Provider, MD  aspirin EC 325 MG tablet Take 325 mg by mouth daily.   Yes Historical Provider, MD  atorvastatin (LIPITOR) 20 MG tablet Take 30 mg by mouth at bedtime.   Yes Historical Provider, MD  B Complex-C-Folic Acid (RENO CAPS) 1 MG CAPS Take 1 mg by mouth every evening. 8pm   Yes Historical Provider, MD  carbidopa-levodopa (SINEMET IR) 25-100 MG per tablet Take 0.5 tablets by  mouth 3 (three) times daily. For dialysis days, give 45 mins prior to HD tx for restless leg syndrome   Yes Historical Provider, MD  Cholecalciferol (VITAMIN D3) 1000 UNITS CAPS Take 1 capsule by mouth daily.   Yes Historical Provider, MD  divalproex (DEPAKOTE) 500 MG DR tablet Take 3 tablets (1,500 mg total) by mouth daily. 01/24/14  Yes Ripudeep Krystal Eaton, MD  escitalopram (LEXAPRO) 10 MG tablet Take 1 tablet (10 mg total) by mouth daily. 01/24/14  Yes Ripudeep Krystal Eaton, MD  gabapentin (NEURONTIN) 300 MG capsule Take 300 mg by mouth 2 (two) times daily.    Yes Historical Provider, MD  metoprolol tartrate (LOPRESSOR) 25 MG tablet Take 6.25 mg by mouth 2 (two) times daily. Take 6.25mg  by mouth daily   Yes Historical Provider, MD  nitroGLYCERIN (NITROSTAT) 0.4 MG SL tablet Place 1 tablet (0.4 mg total) under the tongue  every 5 (five) minutes x 3 doses as needed for chest pain. 05/28/13  Yes Nita Sells, MD  omeprazole (PRILOSEC OTC) 20 MG tablet Take 20 mg by mouth daily as needed (heartburn).    Yes Historical Provider, MD  oxyCODONE-acetaminophen (PERCOCET/ROXICET) 5-325 MG per tablet Take 1 tablet by mouth every 6 (six) hours as needed for severe pain.   Yes Historical Provider, MD  sevelamer carbonate (RENVELA) 800 MG tablet Take 3,200 mg by mouth 3 (three) times daily.   Yes Historical Provider, MD  simethicone (MYLICON) 80 MG chewable tablet Chew 80 mg by mouth 2 (two) times daily as needed for flatulence.   Yes Historical Provider, MD  sucroferric oxyhydroxide (VELPHORO) 500 MG chewable tablet Chew 500 mg by mouth 3 (three) times daily with meals.    Yes Historical Provider, MD  UNABLE TO FIND Med Name: BIO FREEZE   Yes Historical Provider, MD   BP 125/65  Pulse 73  Temp(Src) 98 F (36.7 C) (Oral)  Resp 15  Ht 6\' 4"  (1.93 m)  Wt 262 lb (118.842 kg)  BMI 31.90 kg/m2  SpO2 97% Physical Exam  Nursing note and vitals reviewed. Constitutional: He is oriented to person, place, and time. He appears well-developed and well-nourished.  HENT:  Head: Normocephalic and atraumatic.  Right Ear: External ear normal.  Left Ear: External ear normal.  Eyes: Conjunctivae and EOM are normal. Pupils are equal, round, and reactive to light.  Neck: Normal range of motion and phonation normal. Neck supple.  Cardiovascular: Normal rate.   Pulmonary/Chest: Effort normal. He exhibits no bony tenderness.  Nonfunctional left upper arm graft, has an overlying skin defect, that is not actively bleeding.  Abdominal: Soft. There is no tenderness.  Musculoskeletal: Normal range of motion.  Neurological: He is alert and oriented to person, place, and time. No cranial nerve deficit or sensory deficit. He exhibits normal muscle tone. Coordination normal.  Skin: Skin is warm, dry and intact.  Psychiatric: He has a normal  mood and affect. His behavior is normal. Judgment and thought content normal.    ED Course  Procedures (including critical care time)   I discussed the case with Dr. Scot Dock, came to the ER in the patient. He placed a horizontal mattress suture over the defect in the left arm, at the site of his bleeding. This control further bleeding.  Medications - No data to display  Patient Vitals for the past 24 hrs:  BP Temp Temp src Pulse Resp SpO2 Height Weight  05/18/14 1415 125/65 mmHg - - 73 - 97 % - -  05/18/14  1400 106/71 mmHg - - 72 - 99 % - -  05/18/14 1345 117/56 mmHg - - 72 - 92 % - -  05/18/14 1330 112/48 mmHg - - 71 15 100 % - -  05/18/14 1326 130/52 mmHg - - 75 14 99 % 6\' 4"  (1.93 m) 262 lb (118.842 kg)  05/18/14 1325 130/52 mmHg 98 F (36.7 C) Oral 71 12 100 % - -    3:16 PM Reevaluation with update and discussion. After initial assessment and treatment, an updated evaluation reveals no further c/o. Findings discussed with pt.Daleen Bo L    Labs Review Labs Reviewed  BASIC METABOLIC PANEL - Abnormal; Notable for the following:    Potassium 5.6 (*)    Chloride 94 (*)    Glucose, Bld 203 (*)    BUN 70 (*)    Creatinine, Ser 10.25 (*)    GFR calc non Af Amer 4 (*)    GFR calc Af Amer 5 (*)    Anion gap 20 (*)    All other components within normal limits  CBC WITH DIFFERENTIAL - Abnormal; Notable for the following:    RBC 3.25 (*)    Hemoglobin 10.8 (*)    HCT 32.2 (*)    All other components within normal limits    Imaging Review No results found.   EKG Interpretation None      MDM   Final diagnoses:  Dialysis AV fistula malfunction, initial encounter    Bleeding dialysis graft. Hemoglobin is stable.  Nursing Notes Reviewed/ Care Coordinated Applicable Imaging Reviewed Interpretation of Laboratory Data incorporated into ED treatment  The patient appears reasonably screened and/or stabilized for discharge and I doubt any other medical condition or  other [facility] requiring further screening, evaluation, or treatment in the ED at this time prior to discharge.  Plan: Home Medications- usual; Home Treatments- rest; return here if the recommended treatment, does not improve the symptoms; Recommended follow up- Dialysis tomorrow as scheduled    Richarda Blade, MD 05/18/14 [phone]

## 2014-05-18 NOTE — ED Notes (Signed)
Per EMS pt at dialysis, old dialysis access started bleeding prior to dialysis beginning, bleeding was controled and dialysis continued. Unable to identify how much blood was lost, pt sts it was not a lot.

## 2014-05-18 NOTE — Consult Note (Signed)
[facility]  Reason for Consult:  Bleeding fistula Referring Physician:  EDP MRN #:  [id number]  History of Present Illness: This is a 73 y.o. male with ESRD on HD T TH S who dialyzes at [facility] [address] presented to the [facility] ED via EMS with a "profusely bleeding" left upper arm fistula. He was scheduled for dialysis today, but his fistula started bleeding before he was able to get dialyzed. The fistula is cannulated with a button hole technique. He is unsure of how much blood was lost, but he says it was not a lot. He denies any pain in his hands.   His fistula was placed a year and a half ago in [place]. He moved to [place] in 2014. There are no records on epic of his past vascular surgeries. He reports multiple revisions to his fistula along with thrombectomies for clotted fistulas.   He has hypertension managed with on a beta blocker. He has hyperlipidemia managed with a statin. He has diabetes. No records of diabetes medications on record. He takes a daily aspirin.   Past Medical History  Diagnosis Date  . ESRD on hemodialysis     Started HD around 2009, maybe longer.  Was living in [place], [place] then.  Moved to [place] in 2014 and gets HD now at [facility] on [address] on a TTS schedule.  ESRD was due to DM and HTN.     Marland Kitchen Peripheral vascular disease   . Hypertension   . Hyperlipidemia   . Arthritis     Gout  . Paroxysmal atrial fibrillation   . Brain tumor 2009  . Seizure disorder 2009  . Anemia   . Hyperparathyroidism, secondary   . Diabetes mellitus without complication     Type II, diet controlled  . Proteus mirabilis infection   . CHF (congestive heart failure)     grade 1 DD, preserved EF, [facility] records  . 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 brain tumor  . GERD (gastroesophageal reflux disease)   . Hx of cardiac cath     a. LHC (9/14):  Normal cors, EF 65%.     Past Surgical History  Procedure Laterality Date  . Av fistula placement Left     arm  . 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 Eye surgery Bilateral 2000    cataract  . Retinopathy surgery      Hx. of laser treatments for diabetics    No Known Allergies  Prior to Admission medications   Medication Sig Start Date End Date Taking? Authorizing Provider  acetaminophen (TYLENOL) 325 MG tablet Take 325 mg by mouth every 6 (six) hours as needed for moderate pain or fever.    Historical Provider, MD  aspirin EC 325 MG tablet Take 325 mg by mouth daily.    Historical Provider, MD  atorvastatin (LIPITOR) 20 MG tablet Take 30 mg by mouth at bedtime.    Historical Provider, MD  B Complex-C-Folic Acid (RENO CAPS) 1 MG CAPS Take 1 mg by mouth every evening. 8pm    Historical Provider, MD  carbidopa-levodopa (SINEMET IR) 25-100 MG per tablet Take 0.5 tablets by mouth 3 (three) times daily.    Historical Provider, MD  Cholecalciferol (VITAMIN D3) 1000 UNITS CAPS Take 1 capsule by mouth daily.    Historical Provider, MD  divalproex (  DEPAKOTE) 500 MG DR tablet Take 3 tablets (1,500 mg total) by mouth daily. 01/24/14   Ripudeep Krystal Eaton, MD  escitalopram (LEXAPRO) 10 MG tablet Take 1 tablet (10 mg total) by mouth daily. 01/24/14   Ripudeep Krystal Eaton, MD  gabapentin (NEURONTIN) 300 MG capsule Take 300 mg by mouth 2 (two) times daily.     Historical Provider, MD  metoprolol tartrate (LOPRESSOR) 25 MG tablet Take 6.25 mg by mouth 2 (two) times daily. Take 6.25mg  by mouth daily    Historical Provider, MD  nitroGLYCERIN (NITROSTAT) 0.4 MG SL tablet Place 1 tablet (0.4 mg total) under the tongue every 5 (five) minutes x 3 doses as needed for chest pain. 05/28/13   Nita Sells, MD  omeprazole (PRILOSEC OTC) 20 MG tablet Take 20 mg by mouth daily as needed (heartburn).     Historical Provider, MD  oxyCODONE-acetaminophen (PERCOCET/ROXICET) 5-325 MG per  tablet Take 1 tablet by mouth every 6 (six) hours as needed for severe pain.    Historical Provider, MD  senna-docusate (SENOKOT-S) 8.6-50 MG per tablet Take 1 tablet by mouth at bedtime as needed for mild constipation. 01/24/14   Ripudeep Krystal Eaton, MD  simethicone (MYLICON) 80 MG chewable tablet Chew 80 mg by mouth 2 (two) times daily as needed for flatulence.    Historical Provider, MD  sucroferric oxyhydroxide (VELPHORO) 500 MG chewable tablet Chew 1,000 mg by mouth 3 (three) times daily with meals.    Historical Provider, MD  Wright Name: BIO FREEZE    Historical Provider, MD    History   Social History  . Marital Status: Married    Spouse Name: [name]    Number of Children: 3  . Years of Education: 12th   Occupational History  .      N/A   Social History Main Topics  . Smoking status: Never Smoker   . Smokeless tobacco: Never Used  . Alcohol Use: 0.6 oz/week    1 Shots of liquor per week  . Drug Use: No  . Sexual Activity: No   Other Topics Concern  . Not on file   Social History Narrative   Pt lives at home with spouse.   Caffeine Use: 1-2 cups daily.   Uses a cane for ambulation and prosthetic limb  Left leg.     Family History  Problem Relation Age of Onset  . Seizures Mother   . Stroke Mother 2  . Hypertension Mother   . Alcohol abuse Father   . Hypertension Father   . Arthritis Sister   . Heart attack Neg Hx   . Heart failure Neg Hx     ROS: [x]  Positive   [ ]  Negative   [ ]  All sytems reviewed and are negative  Cardiovascular: []  chest pain/pressure []  palpitations []  SOB lying flat []  DOE []  pain in legs while walking []  pain in legs at rest []  pain in legs at night []  non-healing ulcers []  hx of DVT []  swelling in legs  Pulmonary: []  productive cough []  asthma/wheezing []  home O2  Neurologic: []  weakness in []  arms []  legs []  numbness in []  arms []  legs []  hx of CVA []  mini stroke [] difficulty speaking or slurred  speech []  temporary loss of vision in one eye []  dizziness  Hematologic: []  hx of cancer []  bleeding problems []  problems with blood clotting easily  Endocrine:   [x]  diabetes []  thyroid disease  GI []  vomiting blood []  blood in  stool  GU: [x]  CKD/renal failure []  HD--[]  M/W/F or [x]  T/T/S []  burning with urination []  blood in urine  Psychiatric: []  anxiety []  depression  Musculoskeletal: []  arthritis []  joint pain  Integumentary: []  rashes []  ulcers  Constitutional: []  fever []  chills   Physical Examination  Filed Vitals:   05/18/14 1326  BP: 130/52  Pulse: 75  Temp:   Resp: 14   Body mass index is 31.9 kg/(m^2).  General:  WD obese male in NAD Gait: Not observed HENT: WNL, normocephalic Pulmonary: normal non-labored breathing, without Rales, rhonchi,  wheezing Cardiac: regular, without  Murmurs, rubs or gallops; without carotid bruits Abdomen: soft, NT/ND, no masses Skin: without rashes, without ulcers  Vascular Exam/Pulses: palpable radial pulses bilaterally.  Extremities:  Pulsatile left upper arm AV fistula. Small puncture site with previous active bleeding. Previous dialysis cannulation sites with eschar. left BKA with prosthesis. Right foot is warm. Musculoskeletal: no muscle wasting or atrophy  Neurologic: A&O X 3; Appropriate Affect ; Sensation and motor intact to all extremities. Speech is fluent/normal  CBC    Component Value Date/Time   WBC 14.1* 01/24/2014 0846   WBC 8.0 02/09/2013 1350   RBC 3.46* 01/24/2014 0846   RBC 3.43* 02/09/2013 1350   HGB 10.8* 01/24/2014 0846   HCT 32.1* 01/24/2014 0846   PLT 278 01/24/2014 0846   MCV 92.8 01/24/2014 0846   MCH 31.2 01/24/2014 0846   MCH 31.8 02/09/2013 1350   MCHC 33.6 01/24/2014 0846   MCHC 34.3 02/09/2013 1350   RDW 14.7 01/24/2014 0846   RDW 13.7 02/09/2013 1350   LYMPHSABS 2.1 01/16/2014 0405   LYMPHSABS 2.5 02/09/2013 1350   MONOABS 1.4* 01/16/2014 0405   EOSABS 1.4* 01/16/2014 0405   EOSABS 0.2  02/09/2013 1350   BASOSABS 0.0 01/16/2014 0405   BASOSABS 0.0 02/09/2013 1350    BMET    Component Value Date/Time   NA 129* 01/24/2014 0846   NA 138 02/09/2013 1350   K 5.0 01/24/2014 0846   CL 85* 01/24/2014 0846   CO2 24 01/24/2014 0846   GLUCOSE 197* 01/24/2014 0846   GLUCOSE 137* 02/09/2013 1350   BUN 82* 01/24/2014 0846   BUN 25 02/09/2013 1350   CREATININE 10.04* 01/24/2014 0846   CALCIUM 9.5 01/24/2014 0846   GFRNONAA 4* 01/24/2014 0846   GFRAA 5* 01/24/2014 0846    COAGS: Lab Results  Component Value Date   INR 0.97 05/26/2013    ASSESSMENT: This is a 73 y.o. male with ESRD on dialysis with bleeding left upper extremity fistula.   PLAN: The fistula was no longer bleeding upon entering the patient's room. There was pulsatile flow upon palpation of the fistula and some minor bleeding began. Pressure was held and the bleeding ceased shortly after. The wound was clean and prepped and 1cc of 2% lidocaine without epinephrine was injected surround the wound. One horizontal mattress stitch using 4.0 nylon was placed to secure the wound. The wound was dressed with gauze and tape.    He is getting a CBC to evaluate his acute blood loss. He is safe for discharge from a vascular standpoint. His dialysis center was contacted and arrangements were made for him to dialyze this afternoon. He will need to follow up with [facility] for a  fistulogram at some point in the future due to his pulsatile fistula.    Virgina Jock, PA-C [facility] Office: [phone] Pager: [phone]

## 2014-05-18 NOTE — ED Notes (Signed)
Dr. Dickson at bedside.

## 2014-05-18 NOTE — ED Notes (Signed)
With RN at [facility]. States that she will call back regarding them providing transportation

## 2014-05-19 ENCOUNTER — Emergency Department (HOSPITAL_COMMUNITY): Payer: Medicare Other

## 2014-05-19 ENCOUNTER — Encounter (HOSPITAL_COMMUNITY): Payer: Self-pay | Admitting: Emergency Medicine

## 2014-05-19 ENCOUNTER — Emergency Department (HOSPITAL_COMMUNITY)
Admission: EM | Admit: 2014-05-19 | Discharge: 2014-05-19 | Disposition: A | Discharge status: Home / Self Care | Payer: Medicare Other | Attending: Emergency Medicine | Admitting: Emergency Medicine

## 2014-05-19 DIAGNOSIS — M129 Arthropathy, unspecified: Secondary | ICD-10-CM | POA: Diagnosis not present

## 2014-05-19 DIAGNOSIS — Z7982 Long term (current) use of aspirin: Secondary | ICD-10-CM | POA: Diagnosis not present

## 2014-05-19 DIAGNOSIS — I509 Heart failure, unspecified: Secondary | ICD-10-CM | POA: Diagnosis not present

## 2014-05-19 DIAGNOSIS — G40909 Epilepsy, unspecified, not intractable, without status epilepticus: Secondary | ICD-10-CM | POA: Diagnosis not present

## 2014-05-19 DIAGNOSIS — R259 Unspecified abnormal involuntary movements: Secondary | ICD-10-CM | POA: Diagnosis present

## 2014-05-19 DIAGNOSIS — E119 Type 2 diabetes mellitus without complications: Secondary | ICD-10-CM | POA: Insufficient documentation

## 2014-05-19 DIAGNOSIS — Z79899 Other long term (current) drug therapy: Secondary | ICD-10-CM | POA: Insufficient documentation

## 2014-05-19 DIAGNOSIS — Z9889 Other specified postprocedural states: Secondary | ICD-10-CM | POA: Insufficient documentation

## 2014-05-19 DIAGNOSIS — K219 Gastro-esophageal reflux disease without esophagitis: Secondary | ICD-10-CM | POA: Diagnosis not present

## 2014-05-19 DIAGNOSIS — R251 Tremor, unspecified: Secondary | ICD-10-CM

## 2014-05-19 DIAGNOSIS — D649 Anemia, unspecified: Secondary | ICD-10-CM | POA: Diagnosis not present

## 2014-05-19 DIAGNOSIS — Z86011 Personal history of benign neoplasm of the brain: Secondary | ICD-10-CM | POA: Insufficient documentation

## 2014-05-19 DIAGNOSIS — Z8619 Personal history of other infectious and parasitic diseases: Secondary | ICD-10-CM | POA: Diagnosis not present

## 2014-05-19 DIAGNOSIS — N19 Unspecified kidney failure: Secondary | ICD-10-CM

## 2014-05-19 DIAGNOSIS — N186 End stage renal disease: Secondary | ICD-10-CM | POA: Insufficient documentation

## 2014-05-19 DIAGNOSIS — I4891 Unspecified atrial fibrillation: Secondary | ICD-10-CM | POA: Diagnosis not present

## 2014-05-19 DIAGNOSIS — E785 Hyperlipidemia, unspecified: Secondary | ICD-10-CM | POA: Diagnosis not present

## 2014-05-19 DIAGNOSIS — Z992 Dependence on renal dialysis: Secondary | ICD-10-CM | POA: Diagnosis not present

## 2014-05-19 DIAGNOSIS — I120 Hypertensive chronic kidney disease with stage 5 chronic kidney disease or end stage renal disease: Secondary | ICD-10-CM | POA: Insufficient documentation

## 2014-05-19 LAB — BASIC METABOLIC PANEL
ANION GAP: 22 — AB (ref 5–15)
BUN: 85 mg/dL — ABNORMAL HIGH (ref 6–23)
CALCIUM: 9.6 mg/dL (ref 8.4–10.5)
CHLORIDE: 94 meq/L — AB (ref 96–112)
CO2: 24 meq/L (ref 19–32)
CREATININE: 12.55 mg/dL — AB (ref 0.50–1.35)
GFR calc non Af Amer: 3 mL/min — ABNORMAL LOW (ref >90)
GFR, EST AFRICAN AMERICAN: 4 mL/min — AB (ref >90)
Glucose, Bld: 110 mg/dL — ABNORMAL HIGH (ref 70–99)
Potassium: 5.7 mEq/L — ABNORMAL HIGH (ref 3.7–5.3)
Sodium: 140 mEq/L (ref 137–147)

## 2014-05-19 LAB — CBC
HEMATOCRIT: 32.7 % — AB (ref 39.0–52.0)
HEMOGLOBIN: 10.8 g/dL — AB (ref 13.0–17.0)
MCH: 33.5 pg (ref 26.0–34.0)
MCHC: 33 g/dL (ref 30.0–36.0)
MCV: 101.6 fL — ABNORMAL HIGH (ref 78.0–100.0)
Platelets: 273 10*3/uL (ref 150–400)
RBC: 3.22 MIL/uL — ABNORMAL LOW (ref 4.22–5.81)
RDW: 14.9 % (ref 11.5–15.5)
WBC: 12.9 10*3/uL — ABNORMAL HIGH (ref 4.0–10.5)

## 2014-05-19 MED ORDER — OXYCODONE-ACETAMINOPHEN 5-325 MG PO TABS
1.0000 | ORAL_TABLET | Freq: Once | ORAL | Status: AC
  Administered 2014-05-19: 1 via ORAL
  Filled 2014-05-19: qty 1

## 2014-05-19 MED ORDER — DEXTROSE 50 % IV SOLN
1.0000 | Freq: Once | INTRAVENOUS | Status: DC
  Filled 2014-05-19: qty 50

## 2014-05-19 MED ORDER — SODIUM POLYSTYRENE SULFONATE 15 GM/60ML PO SUSP
45.0000 g | Freq: Once | ORAL | Status: AC
  Administered 2014-05-19: 45 g via ORAL
  Filled 2014-05-19: qty 180

## 2014-05-19 MED ORDER — INSULIN ASPART 100 UNIT/ML ~~LOC~~ SOLN
10.0000 [IU] | Freq: Once | SUBCUTANEOUS | Status: DC
  Filled 2014-05-19: qty 1

## 2014-05-19 NOTE — ED Notes (Signed)
Seen one day in ED for bleeding at left fistula. Today [facility] sent patient to be evaluated for his history of tremors. Patient alert answering and following commands appropriate.

## 2014-05-19 NOTE — Discharge Instructions (Signed)
Return to the ED with any concerns including chest pain, difficulty breathing, vomiting and not able to keep down liquids, decreased level of alertness/lethargy, or any other alarming symptoms °

## 2014-05-19 NOTE — ED Notes (Signed)
To ED with C/O tremors and weakness.  States that he began having tremors this AM.  States that his arms and legs became very weak but he was able to sit down with out falling. Denies LOC.  States that he is having pain in his neck.  States that he has nerve damage in his neck per MD and also that surgery was recomended.

## 2014-05-19 NOTE — ED Notes (Signed)
PT monitored by pulse ox, bp cuff, and 5-lead. 

## 2014-05-19 NOTE — ED Provider Notes (Signed)
CSN: [id number]     Arrival date & time 05/19/14  1452 History   First MD Initiated Contact with Patient 05/19/14 1503     Chief Complaint  Patient presents with  . Tremors     (Consider location/radiation/quality/duration/timing/severity/associated sxs/prior Treatment) HPI Pt presents with c/o generalized weakness and tremor.  He states the symptoms began this morning.  He states he didn't feel well and called to cancel his dialysis.  Dialysis had been rescheduled for today as he missed session yesterday due to bleeding dialysis fistula- was seen in the ED yesterday and discharged.  No chest pain, no difficulty breathing.  No headache.  No focal weakness of arms or legs.  No changes in vision or speech.  He c/o chronic pain in his right leg and neck and is requesting pain medication.  There are no other associated systemic symptoms, there are no other alleviating or modifying factors.   Past Medical History  Diagnosis Date  . ESRD on hemodialysis     Started HD around 2009, maybe longer.  Was living in [place], [place] then.  Moved to [place] in 2014 and gets HD now at [facility] on [address] on a TTS schedule.  ESRD was due to DM and HTN.     Marland Kitchen Peripheral vascular disease   . Hypertension   . Hyperlipidemia   . Arthritis     Gout  . Paroxysmal atrial fibrillation   . Brain tumor 2009  . Seizure disorder 2009  . Anemia   . Hyperparathyroidism, secondary   . Diabetes mellitus without complication     Type II, diet controlled  . Proteus mirabilis infection   . CHF (congestive heart failure)     grade 1 DD, preserved EF, [facility] records  . 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 brain tumor  . GERD (gastroesophageal reflux disease)   . Hx of cardiac cath     a. LHC (9/14):  Normal cors, EF 65%.    Past Surgical History  Procedure Laterality Date  . Av fistula placement Left     arm  . Below knee leg amputation Left April 2012     Left below knee amputation for Osteomyelitis  . Eye surgery Bilateral 2000    cataract  . Retinopathy surgery      Hx. of laser treatments for diabetics   Family History  Problem Relation Age of Onset  . Seizures Mother   . Stroke Mother 37  . Hypertension Mother   . Alcohol abuse Father   . Hypertension Father   . Arthritis Sister   . Heart attack Neg Hx   . Heart failure Neg Hx    History  Substance Use Topics  . Smoking status: Never Smoker   . Smokeless tobacco: Never Used  . Alcohol Use: 0.6 oz/week    1 Shots of liquor per week    Review of Systems ROS reviewed and all otherwise negative except for mentioned in HPI    Allergies  Review of patient's allergies indicates no known allergies.  Home Medications   Prior to Admission medications   Medication Sig Start Date End Date Taking? Authorizing Provider  acetaminophen (TYLENOL) 325 MG tablet Take 325 mg by mouth every 6 (six) hours as needed for moderate pain or fever.   Yes Historical Provider, MD  aspirin EC 325 MG tablet Take 325 mg by mouth daily.   Yes Historical Provider, MD  atorvastatin (  LIPITOR) 20 MG tablet Take 20 mg by mouth at bedtime.    Yes Historical Provider, MD  B Complex-C-Folic Acid (RENO CAPS) 1 MG CAPS Take 1 mg by mouth at bedtime.   Yes Historical Provider, MD  carbidopa-levodopa (SINEMET IR) 25-100 MG per tablet Take 0.5 tablets by mouth 3 (three) times daily. For dialysis days, give 45 mins prior to HD tx for restless leg syndrome   Yes Historical Provider, MD  Cholecalciferol (VITAMIN D3) 1000 UNITS CAPS Take 1 capsule by mouth daily.   Yes Historical Provider, MD  divalproex (DEPAKOTE) 500 MG DR tablet Take 3 tablets (1,500 mg total) by mouth daily. 01/24/14  Yes Ripudeep Krystal Eaton, MD  escitalopram (LEXAPRO) 10 MG tablet Take 1 tablet (10 mg total) by mouth daily. 01/24/14  Yes Ripudeep Krystal Eaton, MD  gabapentin (NEURONTIN) 300 MG capsule Take 300 mg by mouth 2 (two) times daily.    Yes Historical  Provider, MD  Menthol, Topical Analgesic, (BIOFREEZE EX) Apply 1 application topically 4 (four) times daily as needed (For neck pain).   Yes Historical Provider, MD  metoprolol succinate (TOPROL-XL) 25 MG 24 hr tablet Take 12.5 mg by mouth at bedtime.   Yes Historical Provider, MD  nitroGLYCERIN (NITROSTAT) 0.4 MG SL tablet Place 1 tablet (0.4 mg total) under the tongue every 5 (five) minutes x 3 doses as needed for chest pain. 05/28/13  Yes Nita Sells, MD  omeprazole (PRILOSEC OTC) 20 MG tablet Take 20 mg by mouth daily as needed (heartburn).    Yes Historical Provider, MD  oxyCODONE-acetaminophen (PERCOCET/ROXICET) 5-325 MG per tablet Take 1 tablet by mouth every 6 (six) hours as needed for severe pain.   Yes Historical Provider, MD  senna-docusate (SENOKOT-S) 8.6-50 MG per tablet Take 1 tablet by mouth at bedtime as needed for mild constipation.   Yes Historical Provider, MD  sucroferric oxyhydroxide (VELPHORO) 500 MG chewable tablet Chew 1,500 mg by mouth 3 (three) times daily with meals.   Yes Historical Provider, MD  simethicone (MYLICON) 80 MG chewable tablet Chew 80 mg by mouth 2 (two) times daily as needed for flatulence.    Historical Provider, MD   BP 122/97  Pulse 83  Temp(Src) 99.2 F (37.3 C) (Oral)  Resp 10  Ht 5\' 9"  (1.753 m)  Wt 262 lb (118.842 kg)  BMI 38.67 kg/m2  SpO2 98% Vitals reviewed Physical Exam Physical Examination: General appearance - alert, well appearing, and in no distress Mental status - alert, oriented to person, place, and time Eyes - no conjunctival injection, no scleral icterus Mouth - mucous membranes moist, pharynx normal without lesions Chest - clear to auscultation, no wheezes, rales or rhonchi, symmetric air entry Heart - normal rate, regular rhythm, normal S1, S2, no murmurs, rubs, clicks or gallops Abdomen - soft, nontender, nondistended, no masses or organomegaly Neurological - alert, oriented x 3, normal speech, cranial nerves 2-12  tested and intact, strength 5/5 in extremities x 3- left BKA, sensation intact, mild intention tremor/possible asterixis of upper extremities, not present at rest.   Extremities - peripheral pulses normal, no pedal edema, no clubbing or cyanosis Skin - normal coloration and turgor, no rashes  ED Course  Procedures (including critical care time)  6:34 PM d/w Dr. Jimmy Footman, he states that he would only need kayexalate today in the ED and that it is very important that he go for his dialysis tomorrow.  I have stressed this to the patient as well.  No need for overnight  admission per Dr. Jimmy Footman.   Labs Review Labs Reviewed  BASIC METABOLIC PANEL - Abnormal; Notable for the following:    Potassium 5.7 (*)    Chloride 94 (*)    Glucose, Bld 110 (*)    BUN 85 (*)    Creatinine, Ser 12.55 (*)    GFR calc non Af Amer 3 (*)    GFR calc Af Amer 4 (*)    Anion gap 22 (*)    All other components within normal limits  CBC - Abnormal; Notable for the following:    WBC 12.9 (*)    RBC 3.22 (*)    Hemoglobin 10.8 (*)    HCT 32.7 (*)    MCV 101.6 (*)    All other components within normal limits    Imaging Review Ct Head Wo Contrast  05/19/2014   CLINICAL DATA:  Tremors and headache with history of end-stage renal disease seizure disorder and diabetes  EXAM: CT HEAD WITHOUT CONTRAST  TECHNIQUE: Contiguous axial images were obtained from the base of the skull through the vertex without intravenous contrast.  COMPARISON:  Noncontrast CT scan of the brain dated Jan 15, 2014 and MRI of the brain of Jan 20, 2014  FINDINGS: There is stable encephalomalacia of much of the inferior right temporal lobe status post craniectomy. There is mild stable diffuse cerebral and cerebellar atrophy with compensatory ventriculomegaly. There is no shift of the midline. There is a subcentimeter lacune in the posterior limb of the left basal ganglia new since the previous studies. There is no acute intracranial hemorrhage nor  evidence of acute ischemic change. The cerebellum and brainstem are unremarkable.  The observed paranasal sinuses and mastoid air cells are clear. There is no acute skull fracture.  IMPRESSION: 1. There is no acute ischemic or hemorrhagic event. There are chronic stable changes of small vessel ischemia. A subcentimeter lacunar infarction in the posterior limb of the left basal ganglia has appeared since the previous studies. 2. There are stable changes of encephalomalacia status post craniectomy in the right temple region.   Electronically Signed   By: David  Martinique   On: 05/19/2014 16:45     EKG Interpretation   Date/Time:  Friday May 19 2014 17:44:53 EDT Ventricular Rate:  82 PR Interval:    QRS Duration: 95 QT Interval:  389 QTC Calculation: 454 R Axis:   23 Text Interpretation:  Normal sinus rhythm nonspecific ST t wave  abnormalities Baseline wander in lead(s) I III aVL V1 V6 No significant  change since last tracing Confirmed by [facility]  MD, Jalayne Ganesh [phone]) on  05/19/2014 9:26:53 PM      MDM   Final diagnoses:  Renal failure  Tremor    Pt presenting with generalized weakness and upper extremity tremor.  BUN/creat is increased from yesterday and mild hyperkalemia- no EKG changes.  Head CT shows new lacunar infarct, but otherwise no acute changes.  D/w renal and he recommends patient attend his dialysis as scheduled tomorrow and kayexalate for his elevated potassium.  I have discussed with patient the importance of attending this session of dialysis. He verbalizes understanding.  Discharged with strict return precautions.  Pt agreeable with plan.    Threasa Beards, MD 05/19/14 [phone]

## 2014-05-19 NOTE — ED Notes (Signed)
[facility] called to take pt back to [facility]

## 2014-05-19 NOTE — ED Notes (Signed)
[facility] here to get pt  

## 2014-05-23 ENCOUNTER — Inpatient Hospital Stay (HOSPITAL_COMMUNITY)
Admission: EM | Admit: 2014-05-23 | Discharge: 2014-06-01 | DRG: 166 | Disposition: A | Discharge status: Skilled Nursing Facility | Payer: Medicare Other | Attending: Internal Medicine | Admitting: Internal Medicine

## 2014-05-23 ENCOUNTER — Encounter (HOSPITAL_COMMUNITY): Payer: Self-pay | Admitting: Emergency Medicine

## 2014-05-23 ENCOUNTER — Emergency Department (HOSPITAL_COMMUNITY): Payer: Medicare Other

## 2014-05-23 DIAGNOSIS — T8140XA Infection following a procedure, unspecified, initial encounter: Secondary | ICD-10-CM | POA: Diagnosis present

## 2014-05-23 DIAGNOSIS — Z79899 Other long term (current) drug therapy: Secondary | ICD-10-CM | POA: Diagnosis not present

## 2014-05-23 DIAGNOSIS — N390 Urinary tract infection, site not specified: Secondary | ICD-10-CM | POA: Diagnosis present

## 2014-05-23 DIAGNOSIS — R7881 Bacteremia: Secondary | ICD-10-CM | POA: Diagnosis present

## 2014-05-23 DIAGNOSIS — E119 Type 2 diabetes mellitus without complications: Secondary | ICD-10-CM | POA: Diagnosis present

## 2014-05-23 DIAGNOSIS — Z7982 Long term (current) use of aspirin: Secondary | ICD-10-CM | POA: Diagnosis not present

## 2014-05-23 DIAGNOSIS — Z23 Encounter for immunization: Secondary | ICD-10-CM | POA: Diagnosis not present

## 2014-05-23 DIAGNOSIS — C719 Malignant neoplasm of brain, unspecified: Secondary | ICD-10-CM

## 2014-05-23 DIAGNOSIS — N2581 Secondary hyperparathyroidism of renal origin: Secondary | ICD-10-CM | POA: Diagnosis present

## 2014-05-23 DIAGNOSIS — G40909 Epilepsy, unspecified, not intractable, without status epilepticus: Secondary | ICD-10-CM | POA: Diagnosis present

## 2014-05-23 DIAGNOSIS — IMO0001 Reserved for inherently not codable concepts without codable children: Secondary | ICD-10-CM | POA: Diagnosis present

## 2014-05-23 DIAGNOSIS — A4901 Methicillin susceptible Staphylococcus aureus infection, unspecified site: Secondary | ICD-10-CM | POA: Diagnosis present

## 2014-05-23 DIAGNOSIS — J9819 Other pulmonary collapse: Secondary | ICD-10-CM | POA: Diagnosis present

## 2014-05-23 DIAGNOSIS — M899 Disorder of bone, unspecified: Secondary | ICD-10-CM | POA: Diagnosis present

## 2014-05-23 DIAGNOSIS — N186 End stage renal disease: Secondary | ICD-10-CM | POA: Diagnosis not present

## 2014-05-23 DIAGNOSIS — E785 Hyperlipidemia, unspecified: Secondary | ICD-10-CM | POA: Diagnosis present

## 2014-05-23 DIAGNOSIS — H3500 Unspecified background retinopathy: Secondary | ICD-10-CM | POA: Diagnosis present

## 2014-05-23 DIAGNOSIS — Z531 Procedure and treatment not carried out because of patient's decision for reasons of belief and group pressure: Secondary | ICD-10-CM

## 2014-05-23 DIAGNOSIS — Y841 Kidney dialysis as the cause of abnormal reaction of the patient, or of later complication, without mention of misadventure at the time of the procedure: Secondary | ICD-10-CM | POA: Diagnosis present

## 2014-05-23 DIAGNOSIS — R531 Weakness: Secondary | ICD-10-CM

## 2014-05-23 DIAGNOSIS — I509 Heart failure, unspecified: Secondary | ICD-10-CM | POA: Diagnosis present

## 2014-05-23 DIAGNOSIS — G8929 Other chronic pain: Secondary | ICD-10-CM | POA: Diagnosis present

## 2014-05-23 DIAGNOSIS — M949 Disorder of cartilage, unspecified: Secondary | ICD-10-CM | POA: Diagnosis present

## 2014-05-23 DIAGNOSIS — I480 Paroxysmal atrial fibrillation: Secondary | ICD-10-CM

## 2014-05-23 DIAGNOSIS — Z8249 Family history of ischemic heart disease and other diseases of the circulatory system: Secondary | ICD-10-CM

## 2014-05-23 DIAGNOSIS — T82898A Other specified complication of vascular prosthetic devices, implants and grafts, initial encounter: Secondary | ICD-10-CM | POA: Diagnosis present

## 2014-05-23 DIAGNOSIS — J189 Pneumonia, unspecified organism: Principal | ICD-10-CM | POA: Diagnosis present

## 2014-05-23 DIAGNOSIS — I10 Essential (primary) hypertension: Secondary | ICD-10-CM

## 2014-05-23 DIAGNOSIS — A498 Other bacterial infections of unspecified site: Secondary | ICD-10-CM | POA: Diagnosis present

## 2014-05-23 DIAGNOSIS — S88119A Complete traumatic amputation at level between knee and ankle, unspecified lower leg, initial encounter: Secondary | ICD-10-CM | POA: Diagnosis not present

## 2014-05-23 DIAGNOSIS — G20 Parkinson's disease: Secondary | ICD-10-CM | POA: Diagnosis present

## 2014-05-23 DIAGNOSIS — D638 Anemia in other chronic diseases classified elsewhere: Secondary | ICD-10-CM | POA: Diagnosis present

## 2014-05-23 DIAGNOSIS — G9341 Metabolic encephalopathy: Secondary | ICD-10-CM | POA: Diagnosis present

## 2014-05-23 DIAGNOSIS — I120 Hypertensive chronic kidney disease with stage 5 chronic kidney disease or end stage renal disease: Secondary | ICD-10-CM | POA: Diagnosis present

## 2014-05-23 DIAGNOSIS — G20A1 Parkinson's disease without dyskinesia, without mention of fluctuations: Secondary | ICD-10-CM | POA: Diagnosis present

## 2014-05-23 DIAGNOSIS — E118 Type 2 diabetes mellitus with unspecified complications: Secondary | ICD-10-CM

## 2014-05-23 DIAGNOSIS — I639 Cerebral infarction, unspecified: Secondary | ICD-10-CM

## 2014-05-23 DIAGNOSIS — I200 Unstable angina: Secondary | ICD-10-CM

## 2014-05-23 DIAGNOSIS — K219 Gastro-esophageal reflux disease without esophagitis: Secondary | ICD-10-CM | POA: Diagnosis present

## 2014-05-23 DIAGNOSIS — Z992 Dependence on renal dialysis: Secondary | ICD-10-CM

## 2014-05-23 DIAGNOSIS — M47812 Spondylosis without myelopathy or radiculopathy, cervical region: Secondary | ICD-10-CM | POA: Diagnosis present

## 2014-05-23 DIAGNOSIS — C712 Malignant neoplasm of temporal lobe: Secondary | ICD-10-CM

## 2014-05-23 DIAGNOSIS — I739 Peripheral vascular disease, unspecified: Secondary | ICD-10-CM | POA: Diagnosis present

## 2014-05-23 DIAGNOSIS — R4701 Aphasia: Secondary | ICD-10-CM

## 2014-05-23 DIAGNOSIS — M79604 Pain in right leg: Secondary | ICD-10-CM

## 2014-05-23 DIAGNOSIS — I70269 Atherosclerosis of native arteries of extremities with gangrene, unspecified extremity: Secondary | ICD-10-CM

## 2014-05-23 DIAGNOSIS — G9340 Encephalopathy, unspecified: Secondary | ICD-10-CM

## 2014-05-23 DIAGNOSIS — R4182 Altered mental status, unspecified: Secondary | ICD-10-CM | POA: Diagnosis present

## 2014-05-23 HISTORY — DX: Type 2 diabetes mellitus with unspecified complications: E11.8

## 2014-05-23 LAB — VALPROIC ACID LEVEL: Valproic Acid Lvl: 33.7 ug/mL — ABNORMAL LOW (ref 50.0–100.0)

## 2014-05-23 LAB — COMPREHENSIVE METABOLIC PANEL
ALBUMIN: 2.9 g/dL — AB (ref 3.5–5.2)
AST: 12 U/L (ref 0–37)
Alkaline Phosphatase: 66 U/L (ref 39–117)
Anion gap: 24 — ABNORMAL HIGH (ref 5–15)
BUN: 94 mg/dL — ABNORMAL HIGH (ref 6–23)
CALCIUM: 10.1 mg/dL (ref 8.4–10.5)
CO2: 22 mEq/L (ref 19–32)
CREATININE: 12.63 mg/dL — AB (ref 0.50–1.35)
Chloride: 90 mEq/L — ABNORMAL LOW (ref 96–112)
GFR calc Af Amer: 4 mL/min — ABNORMAL LOW (ref >90)
GFR calc non Af Amer: 3 mL/min — ABNORMAL LOW (ref >90)
Glucose, Bld: 180 mg/dL — ABNORMAL HIGH (ref 70–99)
Potassium: 5.3 mEq/L (ref 3.7–5.3)
SODIUM: 136 meq/L — AB (ref 137–147)
TOTAL PROTEIN: 7.7 g/dL (ref 6.0–8.3)
Total Bilirubin: 0.2 mg/dL — ABNORMAL LOW (ref 0.3–1.2)

## 2014-05-23 LAB — CBC
HCT: 27.3 % — ABNORMAL LOW (ref 39.0–52.0)
Hemoglobin: 9 g/dL — ABNORMAL LOW (ref 13.0–17.0)
MCH: 32.3 pg (ref 26.0–34.0)
MCHC: 33 g/dL (ref 30.0–36.0)
MCV: 97.8 fL (ref 78.0–100.0)
PLATELETS: 309 10*3/uL (ref 150–400)
RBC: 2.79 MIL/uL — ABNORMAL LOW (ref 4.22–5.81)
RDW: 14.2 % (ref 11.5–15.5)
WBC: 16.1 10*3/uL — ABNORMAL HIGH (ref 4.0–10.5)

## 2014-05-23 LAB — URINALYSIS, ROUTINE W REFLEX MICROSCOPIC
Bilirubin Urine: NEGATIVE
Glucose, UA: NEGATIVE mg/dL
Ketones, ur: NEGATIVE mg/dL
Nitrite: NEGATIVE
PH: 6 (ref 5.0–8.0)
PROTEIN: 100 mg/dL — AB
Specific Gravity, Urine: 1.015 (ref 1.005–1.030)
Urobilinogen, UA: 0.2 mg/dL (ref 0.0–1.0)

## 2014-05-23 LAB — URINE MICROSCOPIC-ADD ON

## 2014-05-23 LAB — MRSA PCR SCREENING: MRSA BY PCR: NEGATIVE

## 2014-05-23 LAB — I-STAT TROPONIN, ED: Troponin i, poc: 0.01 ng/mL (ref 0.00–0.08)

## 2014-05-23 MED ORDER — METOPROLOL SUCCINATE 12.5 MG HALF TABLET
12.5000 mg | ORAL_TABLET | Freq: Every day | ORAL | Status: DC
  Administered 2014-05-24 – 2014-05-29 (×6): 12.5 mg via ORAL
  Filled 2014-05-23 (×10): qty 1

## 2014-05-23 MED ORDER — ASPIRIN EC 325 MG PO TBEC
325.0000 mg | DELAYED_RELEASE_TABLET | Freq: Every day | ORAL | Status: DC
  Administered 2014-05-24 – 2014-06-01 (×10): 325 mg via ORAL
  Filled 2014-05-23 (×11): qty 1

## 2014-05-23 MED ORDER — VANCOMYCIN HCL IN DEXTROSE 1-5 GM/200ML-% IV SOLN
1000.0000 mg | INTRAVENOUS | Status: DC
  Filled 2014-05-23: qty 200

## 2014-05-23 MED ORDER — HEPARIN SODIUM (PORCINE) 1000 UNIT/ML DIALYSIS: 6000.0000 [IU] | Freq: Once | INTRAMUSCULAR | Status: DC

## 2014-05-23 MED ORDER — VANCOMYCIN HCL 10 G IV SOLR
2500.0000 mg | INTRAVENOUS | Status: DC
  Filled 2014-05-23: qty 2500

## 2014-05-23 MED ORDER — PNEUMOCOCCAL VAC POLYVALENT 25 MCG/0.5ML IJ INJ
0.5000 mL | INJECTION | INTRAMUSCULAR | Status: AC
  Administered 2014-05-24: 0.5 mL via INTRAMUSCULAR
  Filled 2014-05-23: qty 0.5

## 2014-05-23 MED ORDER — CARBIDOPA-LEVODOPA 25-100 MG PO TABS
0.5000 | ORAL_TABLET | Freq: Three times a day (TID) | ORAL | Status: DC
  Administered 2014-05-24: via ORAL
  Administered 2014-05-24 – 2014-05-25 (×5): 0.5 via ORAL
  Administered 2014-05-26: 10:00:00 via ORAL
  Administered 2014-05-26: 0.5 via ORAL
  Administered 2014-05-26: 18:00:00 via ORAL
  Administered 2014-05-27 – 2014-05-29 (×7): 0.5 via ORAL
  Administered 2014-05-30: 1 via ORAL
  Administered 2014-05-30 – 2014-06-01 (×4): 0.5 via ORAL
  Filled 2014-05-23 (×30): qty 0.5

## 2014-05-23 MED ORDER — GABAPENTIN 300 MG PO CAPS
300.0000 mg | ORAL_CAPSULE | Freq: Two times a day (BID) | ORAL | Status: DC
  Filled 2014-05-23: qty 1

## 2014-05-23 MED ORDER — ATORVASTATIN CALCIUM 20 MG PO TABS
20.0000 mg | ORAL_TABLET | Freq: Every day | ORAL | Status: DC
  Administered 2014-05-24 – 2014-05-31 (×9): 20 mg via ORAL
  Filled 2014-05-23 (×10): qty 1

## 2014-05-23 MED ORDER — SODIUM CHLORIDE 0.9 % IV SOLN: 100.0000 mL | INTRAVENOUS | Status: DC | PRN

## 2014-05-23 MED ORDER — VANCOMYCIN HCL 10 G IV SOLR
2500.0000 mg | INTRAVENOUS | Status: AC
  Administered 2014-05-23: 2500 mg via INTRAVENOUS
  Filled 2014-05-23: qty 2500

## 2014-05-23 MED ORDER — LIDOCAINE HCL (PF) 1 % IJ SOLN: 5.0000 mL | INTRAMUSCULAR | Status: DC | PRN

## 2014-05-23 MED ORDER — DIVALPROEX SODIUM 500 MG PO DR TAB
1500.0000 mg | DELAYED_RELEASE_TABLET | Freq: Every day | ORAL | Status: DC
  Administered 2014-05-24 – 2014-06-01 (×9): 1500 mg via ORAL
  Filled 2014-05-23 (×10): qty 3

## 2014-05-23 MED ORDER — HEPARIN SODIUM (PORCINE) 1000 UNIT/ML DIALYSIS: 1000.0000 [IU] | INTRAMUSCULAR | Status: DC | PRN

## 2014-05-23 MED ORDER — SIMETHICONE 80 MG PO CHEW
80.0000 mg | CHEWABLE_TABLET | Freq: Two times a day (BID) | ORAL | Status: DC | PRN
  Filled 2014-05-23: qty 1

## 2014-05-23 MED ORDER — DEXTROSE 5 % IV SOLN: 1.0000 g | Freq: Once | INTRAVENOUS | Status: DC

## 2014-05-23 MED ORDER — VANCOMYCIN HCL IN DEXTROSE 1-5 GM/200ML-% IV SOLN: 1000.0000 mg | INTRAVENOUS | Status: DC

## 2014-05-23 MED ORDER — RENA-VITE PO TABS
1.0000 | ORAL_TABLET | Freq: Every day | ORAL | Status: DC
Start: 2014-05-23 — End: 2014-06-01
  Administered 2014-05-24 – 2014-05-31 (×9): 1 via ORAL
  Filled 2014-05-23 (×11): qty 1

## 2014-05-23 MED ORDER — DEXTROSE 5 % IV SOLN
2.0000 g | INTRAVENOUS | Status: DC
  Filled 2014-05-23: qty 2

## 2014-05-23 MED ORDER — ACETAMINOPHEN 325 MG PO TABS
325.0000 mg | ORAL_TABLET | Freq: Four times a day (QID) | ORAL | Status: DC | PRN
  Administered 2014-05-28 – 2014-06-01 (×3): 325 mg via ORAL
  Filled 2014-05-23 (×3): qty 1

## 2014-05-23 MED ORDER — SENNOSIDES-DOCUSATE SODIUM 8.6-50 MG PO TABS: 1.0000 | ORAL_TABLET | Freq: Every evening | ORAL | Status: DC | PRN

## 2014-05-23 MED ORDER — DEXTROSE 5 % IV SOLN
2.0000 g | INTRAVENOUS | Status: AC
  Administered 2014-05-23: 2 g via INTRAVENOUS
  Filled 2014-05-23 (×2): qty 2

## 2014-05-23 MED ORDER — DARBEPOETIN ALFA-POLYSORBATE 60 MCG/0.3ML IJ SOLN: 60.0000 ug | INTRAMUSCULAR | Status: DC

## 2014-05-23 MED ORDER — DEXTROSE 5 % IV SOLN: 2.0000 g | INTRAVENOUS | Status: DC

## 2014-05-23 MED ORDER — OXYCODONE-ACETAMINOPHEN 5-325 MG PO TABS: 1.0000 | ORAL_TABLET | Freq: Four times a day (QID) | ORAL | Status: DC | PRN

## 2014-05-23 MED ORDER — PENTAFLUOROPROP-TETRAFLUOROETH EX AERO: 1.0000 "application " | INHALATION_SPRAY | CUTANEOUS | Status: DC | PRN

## 2014-05-23 MED ORDER — SUCROFERRIC OXYHYDROXIDE 500 MG PO CHEW
500.0000 mg | CHEWABLE_TABLET | Freq: Every day | ORAL | Status: DC
  Administered 2014-05-24 – 2014-05-31 (×6): 500 mg via ORAL
  Filled 2014-05-23 (×11): qty 1

## 2014-05-23 MED ORDER — ALTEPLASE 2 MG IJ SOLR: 2.0000 mg | Freq: Once | INTRAMUSCULAR | Status: DC | PRN

## 2014-05-23 MED ORDER — NEPRO/CARBSTEADY PO LIQD: 237.0000 mL | ORAL | Status: DC | PRN

## 2014-05-23 MED ORDER — ENOXAPARIN SODIUM 30 MG/0.3ML ~~LOC~~ SOLN
30.0000 mg | SUBCUTANEOUS | Status: DC
Start: 2014-05-23 — End: 2014-05-27
  Administered 2014-05-24 – 2014-05-26 (×4): 30 mg via SUBCUTANEOUS
  Filled 2014-05-23 (×5): qty 0.3

## 2014-05-23 MED ORDER — VANCOMYCIN HCL IN DEXTROSE 1-5 GM/200ML-% IV SOLN
1000.0000 mg | INTRAVENOUS | Status: DC
  Administered 2014-05-25: 1000 mg via INTRAVENOUS
  Filled 2014-05-23 (×2): qty 200

## 2014-05-23 MED ORDER — NITROGLYCERIN 0.4 MG SL SUBL: 0.4000 mg | SUBLINGUAL_TABLET | SUBLINGUAL | Status: DC | PRN

## 2014-05-23 MED ORDER — OMEPRAZOLE MAGNESIUM 20 MG PO TBEC: 20.0000 mg | DELAYED_RELEASE_TABLET | Freq: Every day | ORAL | Status: DC | PRN

## 2014-05-23 MED ORDER — LIDOCAINE-PRILOCAINE 2.5-2.5 % EX CREA: 1.0000 "application " | TOPICAL_CREAM | CUTANEOUS | Status: DC | PRN

## 2014-05-23 MED ORDER — ESCITALOPRAM OXALATE 10 MG PO TABS
10.0000 mg | ORAL_TABLET | Freq: Every day | ORAL | Status: DC
  Administered 2014-05-24: 10 mg via ORAL
  Filled 2014-05-23 (×2): qty 1

## 2014-05-23 MED ORDER — VANCOMYCIN HCL 10 G IV SOLR
2500.0000 mg | Freq: Once | INTRAVENOUS | Status: AC
  Administered 2014-05-23: 2500 mg via INTRAVENOUS
  Filled 2014-05-23: qty 2500

## 2014-05-23 MED ORDER — INFLUENZA VAC SPLIT QUAD 0.5 ML IM SUSY
0.5000 mL | PREFILLED_SYRINGE | INTRAMUSCULAR | Status: DC
  Filled 2014-05-23: qty 0.5

## 2014-05-23 MED ORDER — SEVELAMER CARBONATE 800 MG PO TABS
3200.0000 mg | ORAL_TABLET | Freq: Three times a day (TID) | ORAL | Status: DC
  Administered 2014-05-24 – 2014-06-01 (×15): 3200 mg via ORAL
  Filled 2014-05-23 (×29): qty 4

## 2014-05-23 MED ORDER — PANTOPRAZOLE SODIUM 40 MG PO TBEC
40.0000 mg | DELAYED_RELEASE_TABLET | Freq: Every day | ORAL | Status: DC | PRN
Start: 2014-05-23 — End: 2014-06-01
  Filled 2014-05-23: qty 1

## 2014-05-23 NOTE — Progress Notes (Signed)
ANTIBIOTIC CONSULT NOTE - INITIAL  Pharmacy Consult for Vancomycin Indication: pneumonia  No Known Allergies  Patient Measurements:   Adjusted Body Weight: n/a   Vital Signs: Temp: 98.4 F (36.9 C) (09/08 1142) Temp src: Oral (09/08 1142) BP: 156/65 mmHg (09/08 1233) Pulse Rate: 93 (09/08 1233) Intake/Output from previous day:   Intake/Output from this shift:    Labs:  Recent Labs  05/23/14 1156  WBC 16.1*  HGB 9.0*  PLT 309  CREATININE 12.63*   The CrCl is unknown because both a height and weight (above a minimum accepted value) are required for this calculation. No results found for this basename: VANCOTROUGH, VANCOPEAK, VANCORANDOM, GENTTROUGH, GENTPEAK, GENTRANDOM, TOBRATROUGH, TOBRAPEAK, TOBRARND, AMIKACINPEAK, AMIKACINTROU, AMIKACIN,  in the last 72 hours   Microbiology: No results found for this or any previous visit (from the past 720 hour(s)).  Medical History: Past Medical History  Diagnosis Date  . ESRD on hemodialysis     Started HD around 2009, maybe longer.  Was living in [place], [place] then.  Moved to [place] in 2014 and gets HD now at [facility] on [address] on a TTS schedule.  ESRD was due to DM and HTN.     Marland Kitchen Peripheral vascular disease   . Hypertension   . Hyperlipidemia   . Arthritis     Gout  . Paroxysmal atrial fibrillation   . Brain tumor 2009  . Seizure disorder 2009  . Anemia   . Hyperparathyroidism, secondary   . Diabetes mellitus without complication     Type II, diet controlled  . Proteus mirabilis infection   . CHF (congestive heart failure)     grade 1 DD, preserved EF, [facility] records  . 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 brain tumor  . GERD (gastroesophageal reflux disease)   . Hx of cardiac cath     a. LHC (9/14):  Normal cors, EF 65%.     Medications:   (Not in a hospital admission) Assessment: 30 YOM presented to the ED for altered mental status. He was seen in  the ED 4 days ago for the same and discharged back to SNF. Pt states he feels weak and unwell. He has a h/o ESRD on HD TTS. CXR shows right basilar atelectasis and right mid lung infiltrate. Pharmacy consulted to start vancomycin for pneumonia. WBC elevated at 16.1. Afebrile. CrCl < 10   Cultures:  9/8 Blood Cx x2>> 9/8 Urine >>   Goal of Therapy:  Vancomycin trough level 15-20 mcg/ml  Plan:  -Give a Vancomycin loading dose of 2500 mg x 1 dose followed by Vanc 1 gm IV Q HD  -Monitor CBC, cultures and patient's clinical progress -F/u HD plans while admitted - due for session today?  Albertina Parr, PharmD.  Clinical Pharmacist Pager [phone]

## 2014-05-23 NOTE — ED Notes (Signed)
Pt reports he doesn't make much urine anymore.

## 2014-05-23 NOTE — ED Notes (Signed)
Per EMS - pt coming from [facility] nursing home. Pt was sent here from SNF for altered mental status and because the pt c/o pain "all over". Pt is a dialysis pt, last went on Saturday, was supposed to go today but never went. Pt here for the same 2 days ago, symptoms have not gone away and staff denies them worsening. Pt has a left BKA but normally can walk with a walker, staff has noticed increased difficulty of walking x 4 days. No neuro deficits seen. BP 122/80 HR 76 irreg, 99% on room air. CBG 168.

## 2014-05-23 NOTE — Progress Notes (Signed)
When pt arrived in HD for tx this evening around 1715p, a cut on his left arm approximately 2 to 3 inches under access began squirting blood.  Applied pressure immediately and held for fifteen minutes.  Blood was still pulsing out when pressure was not applied.  Notes section of patient chart stated that patient had suture placed 9.3.15 by VVS.  The suture was not in place today, but do not know what happened to it.  MD was paged.  I was instructed by MD to page Trula Slade MD, on call for [facility].  He was scheduled in the OR for the next couple of hours so his PA instructed me to page the Nephrologist on call.  Posey Pronto, MD came to assess patient's arm.  I was instructed to apply steri strips, gel foam and gauze and try to start HD tx. PA from OR also came to assess patient and agreed.  All together, pressure was held on site for one hour before bleeding stopped. HD tx was initiated and completed w/ no more complications.  Site is still bandaged on patient arm.

## 2014-05-23 NOTE — ED Notes (Signed)
Pt c/o some left neck discomfort, reports hx of nerve pain in same location and when he gets biofreeze it helps the pain.

## 2014-05-23 NOTE — ED Notes (Signed)
Attempted report 

## 2014-05-23 NOTE — H&P (Signed)
[facility] History and Physical  [name] [name] CVE:[id number] DOB: [birth year] DOA: 05/23/2014  Referring physician: EDP PCP: Donetta Potts, MD   Chief Complaint: sent from [facility] for confusion.   HPI: [name] [name] is a 73 y.o. male with prior h/o ESRD ON HD due for HD today , was brought in for confusion. On arrival to ED, pt was found to have right sided pneumonia and a urinary tract infection. He is confused and does not give me a detailed history. No family id not at bedside. Most of the history obtained from EDP. He reports having neck pain for several months and wants pain medications . He denies any other complaints. He was referred to medical service for management of HCAP.    Review of Systems:  Could not be obtained  Past Medical History  Diagnosis Date  . ESRD on hemodialysis     Started HD around 2009, maybe longer.  Was living in [place], [place] then.  Moved to [place] in 2014 and gets HD now at [facility] on [address] on a TTS schedule.  ESRD was due to DM and HTN.     Marland Kitchen Peripheral vascular disease   . Hypertension   . Hyperlipidemia   . Arthritis     Gout  . Paroxysmal atrial fibrillation   . Brain tumor 2009  . Seizure disorder 2009  . Anemia   . Hyperparathyroidism, secondary   . Diabetes mellitus without complication     Type II, diet controlled  . Proteus mirabilis infection   . CHF (congestive heart failure)     grade 1 DD, preserved EF, [facility] records  . 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 brain tumor  . GERD (gastroesophageal reflux disease)   . Hx of cardiac cath     a. LHC (9/14):  Normal cors, EF 65%.    Past Surgical History  Procedure Laterality Date  . Av fistula placement Left     arm  . 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 Eye surgery Bilateral 2000    cataract  . Retinopathy surgery      Hx. of laser treatments for  diabetics   Social History:  reports that he has never smoked. He has never used smokeless tobacco. He reports that he drinks about .6 ounces of alcohol per week. He reports that he does not use illicit drugs.  No Known Allergies  Family History  Problem Relation Age of Onset  . Seizures Mother   . Stroke Mother 33  . Hypertension Mother   . Alcohol abuse Father   . Hypertension Father   . Arthritis Sister   . Heart attack Neg Hx   . Heart failure Neg Hx      Prior to Admission medications   Medication Sig Start Date End Date Taking? Authorizing Provider  acetaminophen (TYLENOL) 325 MG tablet Take 325 mg by mouth every 6 (six) hours as needed for moderate pain or fever.   Yes Historical Provider, MD  aspirin EC 325 MG tablet Take 325 mg by mouth daily.   Yes Historical Provider, MD  atorvastatin (LIPITOR) 20 MG tablet Take 20 mg by mouth at bedtime.    Yes Historical Provider, MD  B Complex-C-Folic Acid (RENO CAPS) 1 MG CAPS Take 1 mg by mouth at bedtime.   Yes Historical Provider, MD  carbidopa-levodopa (SINEMET IR) 25-100 MG per  tablet Take 0.5 tablets by mouth 3 (three) times daily. For dialysis days, give 45 mins prior to HD tx for restless leg syndrome   Yes Historical Provider, MD  Cholecalciferol (VITAMIN D3) 1000 UNITS CAPS Take 1 capsule by mouth daily.   Yes Historical Provider, MD  divalproex (DEPAKOTE) 500 MG DR tablet Take 3 tablets (1,500 mg total) by mouth daily. 01/24/14  Yes Ripudeep Krystal Eaton, MD  escitalopram (LEXAPRO) 10 MG tablet Take 1 tablet (10 mg total) by mouth daily. 01/24/14  Yes Ripudeep Krystal Eaton, MD  gabapentin (NEURONTIN) 300 MG capsule Take 300 mg by mouth 2 (two) times daily.    Yes Historical Provider, MD  Menthol, Topical Analgesic, (BIOFREEZE EX) Apply 1 application topically 4 (four) times daily as needed (For neck pain).   Yes Historical Provider, MD  metoprolol succinate (TOPROL-XL) 25 MG 24 hr tablet Take 12.5 mg by mouth at bedtime.   Yes Historical  Provider, MD  nitroGLYCERIN (NITROSTAT) 0.4 MG SL tablet Place 1 tablet (0.4 mg total) under the tongue every 5 (five) minutes x 3 doses as needed for chest pain. 05/28/13  Yes Nita Sells, MD  omeprazole (PRILOSEC OTC) 20 MG tablet Take 20 mg by mouth daily as needed (heartburn).    Yes Historical Provider, MD  oxyCODONE-acetaminophen (PERCOCET/ROXICET) 5-325 MG per tablet Take 1 tablet by mouth every 6 (six) hours as needed for severe pain.   Yes Historical Provider, MD  senna-docusate (SENOKOT-S) 8.6-50 MG per tablet Take 1 tablet by mouth at bedtime as needed for mild constipation.   Yes Historical Provider, MD  simethicone (MYLICON) 80 MG chewable tablet Chew 80 mg by mouth 2 (two) times daily as needed for flatulence.   Yes Historical Provider, MD   Physical Exam: Filed Vitals:   05/23/14 1300 05/23/14 1330 05/23/14 1400 05/23/14 1648  BP: 161/75 157/78  144/72  Pulse: 80 83 82 84  Temp:    98.9 F (37.2 C)  TempSrc:    Oral  Resp: 12 14 18 17   Height: 5' 8.9" (1.75 m)     Weight: 118 kg (260 lb 2.3 oz)     SpO2: 94% 96% 98% 96%    Wt Readings from Last 3 Encounters:  05/23/14 118 kg (260 lb 2.3 oz)  05/19/14 118.842 kg (262 lb)  05/18/14 118.842 kg (262 lb)    General:  Appears calm and comfortable Eyes: PERRL, normal lids, irises & conjunctiva Neck: no LAD, masses or thyromegaly Cardiovascular: RRR, no m/r/g. No LE edema. Respiratory: diminished air entry at bases.  Abdomen: soft, ntnd Skin: no rash or induration seen on limited exam Musculoskeletal: PEDAL EDEMA 1+           Labs on Admission:  Basic Metabolic Panel:  Recent Labs Lab 05/18/14 1333 05/19/14 1514 05/23/14 1156  NA 140 140 136*  K 5.6* 5.7* 5.3  CL 94* 94* 90*  CO2 26 24 22   GLUCOSE 203* 110* 180*  BUN 70* 85* 94*  CREATININE 10.25* 12.55* 12.63*  CALCIUM 9.6 9.6 10.1   Liver Function Tests:  Recent Labs Lab 05/23/14 1156  AST 12  ALT <5  ALKPHOS 66  BILITOT 0.2*  PROT 7.7    ALBUMIN 2.9*   No results found for this basename: LIPASE, AMYLASE,  in the last 168 hours No results found for this basename: AMMONIA,  in the last 168 hours CBC:  Recent Labs Lab 05/18/14 1333 05/19/14 1514 05/23/14 1156  WBC 7.4 12.9* 16.1*  NEUTROABS  4.1  --   --   HGB 10.8* 10.8* 9.0*  HCT 32.2* 32.7* 27.3*  MCV 99.1 101.6* 97.8  PLT 285 273 309   Cardiac Enzymes: No results found for this basename: CKTOTAL, CKMB, CKMBINDEX, TROPONINI,  in the last 168 hours  BNP (last 3 results)  Recent Labs  05/26/13 1700 01/15/14 1316  PROBNP 699.3* 1399.0*   CBG: No results found for this basename: GLUCAP,  in the last 168 hours  Radiological Exams on Admission: Dg Chest 2 View  05/23/2014   CLINICAL DATA:  Weakness  EXAM: CHEST  2 VIEW  COMPARISON:  01/15/2014  FINDINGS: Cardiac shadow is mildly enlarged. The lungs are hypoinflated. Right basilar atelectasis is noted. Additionally some right mid lung infiltrate is seen. No acute bony abnormality is noted.  IMPRESSION: Right basilar atelectasis and right mid lung infiltrate.   Electronically Signed   By: Inez Catalina M.D.   On: 05/23/2014 12:26    EKG: SINUS RHYTHM  Assessment/Plan   HCAP (healthcare-associated pneumonia); - admit to med surg. Started on IV vancomycin a nd IV cefepime. Sputum and blood cultures ordered and keep oxygen sats> 90% Urinary Tract infection.; Makes very little urine. Cultures pending.  Parkinson's disease: Resume home meds.  Leukocytosis; ESRD on HD: renal consulted and plan for HD today Anemia: normocytic and continue to monitor.     Code Status: full code.  DVT Prophylaxis: Family Communication: none at bedside. Will call son. Disposition Plan: admit  Time spent: 60 min  [facility] Pager [phone]  **Disclaimer: This note may have been dictated with voice recognition software. Similar sounding words can inadvertently be transcribed and this note may contain  transcription errors which may not have been corrected upon publication of note.**

## 2014-05-23 NOTE — Consult Note (Signed)
Indication for Consultation:  Management of ESRD/hemodialysis; anemia, hypertension/volume and secondary hyperparathyroidism  HPI: [name] [name] is a 73 y.o. male who was sent to the ED this AM from his SNF with concerns of AMS, though he denies any confusion at this time. He receives HD TTS @ [place], last HD Saturday. He was seen in the ED twice last week. On 9/3 he presented to the ED from his HD center with bleeding from AVF prior to cannulation, he was seen by [facility], a suture was placed and they recommended follow up with a fistulogram. On 9/4 he was sent from SNF with weakness, and upper extremity tremor. Head CT was negative for any acute events. He got kayexalate for high K and was dc'd with instructions to keep his OP HD appt the next day. Today he was feeling extremely weak and tired, didn't think he could make it to his outpt dialysis so he was send to the ED for eval. Will arrange treatment today. Pt is confused now and a vague historian but he described a recent diagnosis of painful neuropathy symptoms and was started on a new medication  Neurontin about 2 weeks ago.  He has been confused. He thinks the new medication may be part of the problem. +feverish, nonsmoker, no etoh, lives alone.   Past Medical History  Diagnosis Date  . ESRD on hemodialysis     Started HD around 2009, maybe longer.  Was living in [place], [place] then.  Moved to [place] in 2014 and gets HD now at [facility] on [address] on a TTS schedule.  ESRD was due to DM and HTN.     Marland Kitchen Peripheral vascular disease   . Hypertension   . Hyperlipidemia   . Arthritis     Gout  . Paroxysmal atrial fibrillation   . Brain tumor 2009  . Seizure disorder 2009  . Anemia   . Hyperparathyroidism, secondary   . Diabetes mellitus without complication     Type II, diet controlled  . Proteus mirabilis infection   . CHF (congestive heart failure)     grade 1 DD, preserved EF, [facility] records  . Mixed oligoastrocytoma 2009    with  resection.  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 brain tumor  . GERD (gastroesophageal reflux disease)   . Hx of cardiac cath     a. LHC (9/14):  Normal cors, EF 65%.    Past Surgical History  Procedure Laterality Date  . Av fistula placement Left     arm  . 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 Eye surgery Bilateral 2000    cataract  . Retinopathy surgery      Hx. of laser treatments for diabetics   Family History  Problem Relation Age of Onset  . Seizures Mother   . Stroke Mother 9  . Hypertension Mother   . Alcohol abuse Father   . Hypertension Father   . Arthritis Sister   . Heart attack Neg Hx   . Heart failure Neg Hx    Social History:  reports that he has never smoked. He has never used smokeless tobacco. He reports that he drinks about .6 ounces of alcohol per week. He reports that he does not use illicit drugs. No Known Allergies Prior to Admission medications   Medication Sig Start Date End Date Taking? Authorizing Provider  acetaminophen (TYLENOL) 325 MG tablet Take 325 mg by mouth every  6 (six) hours as needed for moderate pain or fever.   Yes Historical Provider, MD  aspirin EC 325 MG tablet Take 325 mg by mouth daily.   Yes Historical Provider, MD  atorvastatin (LIPITOR) 20 MG tablet Take 20 mg by mouth at bedtime.    Yes Historical Provider, MD  B Complex-C-Folic Acid (RENO CAPS) 1 MG CAPS Take 1 mg by mouth at bedtime.   Yes Historical Provider, MD  carbidopa-levodopa (SINEMET IR) 25-100 MG per tablet Take 0.5 tablets by mouth 3 (three) times daily. For dialysis days, give 45 mins prior to HD tx for restless leg syndrome   Yes Historical Provider, MD  Cholecalciferol (VITAMIN D3) 1000 UNITS CAPS Take 1 capsule by mouth daily.   Yes Historical Provider, MD  divalproex (DEPAKOTE) 500 MG DR tablet Take 3 tablets (1,500 mg total) by mouth daily. 01/24/14  Yes Ripudeep Krystal Eaton, MD  escitalopram (LEXAPRO) 10 MG  tablet Take 1 tablet (10 mg total) by mouth daily. 01/24/14  Yes Ripudeep Krystal Eaton, MD  gabapentin (NEURONTIN) 300 MG capsule Take 300 mg by mouth 2 (two) times daily.    Yes Historical Provider, MD  Menthol, Topical Analgesic, (BIOFREEZE EX) Apply 1 application topically 4 (four) times daily as needed (For neck pain).   Yes Historical Provider, MD  metoprolol succinate (TOPROL-XL) 25 MG 24 hr tablet Take 12.5 mg by mouth at bedtime.   Yes Historical Provider, MD  nitroGLYCERIN (NITROSTAT) 0.4 MG SL tablet Place 1 tablet (0.4 mg total) under the tongue every 5 (five) minutes x 3 doses as needed for chest pain. 05/28/13  Yes Nita Sells, MD  omeprazole (PRILOSEC OTC) 20 MG tablet Take 20 mg by mouth daily as needed (heartburn).    Yes Historical Provider, MD  oxyCODONE-acetaminophen (PERCOCET/ROXICET) 5-325 MG per tablet Take 1 tablet by mouth every 6 (six) hours as needed for severe pain.   Yes Historical Provider, MD  senna-docusate (SENOKOT-S) 8.6-50 MG per tablet Take 1 tablet by mouth at bedtime as needed for mild constipation.   Yes Historical Provider, MD  simethicone (MYLICON) 80 MG chewable tablet Chew 80 mg by mouth 2 (two) times daily as needed for flatulence.   Yes Historical Provider, MD   Current Facility-Administered Medications  Medication Dose Route Frequency Provider Last Rate Last Dose  . ceFEPIme (MAXIPIME) 1 g in dextrose 5 % 50 mL IVPB  1 g Intravenous Once Larene Pickett, PA-C      . vancomycin (VANCOCIN) 2,500 mg in sodium chloride 0.9 % 500 mL IVPB  2,500 mg Intravenous Once Lavenia Atlas, [facility]       Current Outpatient Prescriptions  Medication Sig Dispense Refill  . acetaminophen (TYLENOL) 325 MG tablet Take 325 mg by mouth every 6 (six) hours as needed for moderate pain or fever.      Marland Kitchen aspirin EC 325 MG tablet Take 325 mg by mouth daily.      Marland Kitchen atorvastatin (LIPITOR) 20 MG tablet Take 20 mg by mouth at bedtime.       . B Complex-C-Folic Acid (RENO CAPS) 1 MG  CAPS Take 1 mg by mouth at bedtime.      . carbidopa-levodopa (SINEMET IR) 25-100 MG per tablet Take 0.5 tablets by mouth 3 (three) times daily. For dialysis days, give 45 mins prior to HD tx for restless leg syndrome      . Cholecalciferol (VITAMIN D3) 1000 UNITS CAPS Take 1 capsule by mouth daily.      Marland Kitchen  divalproex (DEPAKOTE) 500 MG DR tablet Take 3 tablets (1,500 mg total) by mouth daily.  30 tablet  0  . escitalopram (LEXAPRO) 10 MG tablet Take 1 tablet (10 mg total) by mouth daily.  30 tablet  0  . gabapentin (NEURONTIN) 300 MG capsule Take 300 mg by mouth 2 (two) times daily.       . Menthol, Topical Analgesic, (BIOFREEZE EX) Apply 1 application topically 4 (four) times daily as needed (For neck pain).      . metoprolol succinate (TOPROL-XL) 25 MG 24 hr tablet Take 12.5 mg by mouth at bedtime.      . nitroGLYCERIN (NITROSTAT) 0.4 MG SL tablet Place 1 tablet (0.4 mg total) under the tongue every 5 (five) minutes x 3 doses as needed for chest pain.  30 tablet  12  . omeprazole (PRILOSEC OTC) 20 MG tablet Take 20 mg by mouth daily as needed (heartburn).       Marland Kitchen oxyCODONE-acetaminophen (PERCOCET/ROXICET) 5-325 MG per tablet Take 1 tablet by mouth every 6 (six) hours as needed for severe pain.      Marland Kitchen senna-docusate (SENOKOT-S) 8.6-50 MG per tablet Take 1 tablet by mouth at bedtime as needed for mild constipation.      . simethicone (MYLICON) 80 MG chewable tablet Chew 80 mg by mouth 2 (two) times daily as needed for flatulence.       Labs: Basic Metabolic Panel:  Recent Labs Lab 05/18/14 1333 05/19/14 1514 05/23/14 1156  NA 140 140 136*  K 5.6* 5.7* 5.3  CL 94* 94* 90*  CO2 26 24 22   GLUCOSE 203* 110* 180*  BUN 70* 85* 94*  CREATININE 10.25* 12.55* 12.63*  CALCIUM 9.6 9.6 10.1   Liver Function Tests:  Recent Labs Lab 05/23/14 1156  AST 12  ALT <5  ALKPHOS 66  BILITOT 0.2*  PROT 7.7  ALBUMIN 2.9*   No results found for this basename: LIPASE, AMYLASE,  in the last 168  hours No results found for this basename: AMMONIA,  in the last 168 hours CBC:  Recent Labs Lab 05/18/14 1333 05/19/14 1514 05/23/14 1156  WBC 7.4 12.9* 16.1*  NEUTROABS 4.1  --   --   HGB 10.8* 10.8* 9.0*  HCT 32.2* 32.7* 27.3*  MCV 99.1 101.6* 97.8  PLT 285 273 309   Cardiac Enzymes: No results found for this basename: CKTOTAL, CKMB, CKMBINDEX, TROPONINI,  in the last 168 hours CBG: No results found for this basename: GLUCAP,  in the last 168 hours Iron Studies: No results found for this basename: IRON, TIBC, TRANSFERRIN, FERRITIN,  in the last 72 hours Studies/Results: Dg Chest 2 View  05/23/2014   CLINICAL DATA:  Weakness  EXAM: CHEST  2 VIEW  COMPARISON:  01/15/2014  FINDINGS: Cardiac shadow is mildly enlarged. The lungs are hypoinflated. Right basilar atelectasis is noted. Additionally some right mid lung infiltrate is seen. No acute bony abnormality is noted.  IMPRESSION: Right basilar atelectasis and right mid lung infiltrate.   Electronically Signed   By: Inez Catalina M.D.   On: 05/23/2014 12:26   Review of Systems: Negative except for weakness and fatigue x 4 days Denies sob, cough, fever, chills. Denies urinary symptoms  Physical Exam: Filed Vitals:   05/23/14 1142 05/23/14 1200 05/23/14 1233 05/23/14 1300  BP: 143/71 128/70 156/65   Pulse:  78 93   Temp: 98.4 F (36.9 C)     TempSrc: Oral     Resp: 16 16  Height:    5' 8.9" (1.75 m)  Weight:    118 kg (260 lb 2.3 oz)  SpO2: 96% 95%       General: Well developed, well nourished, in no acute distress.  Head: Normocephalic, atraumatic, sclera non-icteric, mucus membranes are moist Confused Neck: Supple. JVD not elevated. Lungs: Clear bilaterally to auscultation without wheezes, rales, or rhonchi. Breathing is unlabored. Heart: RRR with S1 S2. No murmurs, rubs, or gallops appreciated. Abdomen: Soft, non-tender, non-distended with normoactive bowel sounds. No rebound/guarding. No obvious abdominal masses. M-S:   Strength and tone appear normal for age. Lower extremities: L BKA. RLE +1 pedal edema.  Neuro: Alert and oriented X 3. Moves all extremities spontaneously. Upper extremity resting tremor R>L Psych:  Responds to questions appropriately with a normal affect. Dialysis Access: L AVF + b/t several cc's of light brown pus returned with removal of tape from the more distal needle stick, then there was a brief arterial bleed which responded to pressure, was not able to culture the purulent drainage; no obvious abcess or cellulitis noted  Dialysis Orders:  [facility] 30 mins 118kg   2K/2Ca+ 350/8000  6000 Heparin L AVF  hectorol 4 mcg Aranesp 50 q week No Fe  Assessment/Plan: 1.  PNA- R infiltrate on xray. Cefepime and Vanc per pharm. Blood cultures pending. Afebrile.  Sputum culture pending.  2. UTI- culture pending. antibx per pharm. Has had AMS in the past (may 2015) secondary to UTI, +Ecoli and was treated with Rocephin 3. Tremor- Head CT- no acute event on 9/4. Follows with neurology, last appt 8/31- continue divalproex for seizure and carb/levo for tremor   4.  ESRD -  TTS @ [place]. HD pending today. K+ 5.3  5.  Hypertension/volume  - 156/65, under edw last tx, lower.  Metop.  6.  Anemia  -hgb 9 down from baseline of 10, ESA q Thursday, give today, missed last weeks dose. No Fe, last tsat 28. 7.  Metabolic bone disease -  KC+12.7/51 corrected. Hold hectorol.  2 Ca+ bath. - last phos 8.9 (8/27) PTH 391 (7/23) Cont binders - renvela and velphoro 8.  Nutrition - alb 2.9. Multi vit renal diet.  9. AVF bleeding- follow up with VVS for fistulogram 10. Spinal stenosis  Shelle Iron, NP [facility] [phone] 05/23/2014, 2:19 PM   Pt seen, examined, agree w assess/plan as above with additions as indicated. ESRD patient w AMS and gen weakness , ^WBC, recently started on Neurontin. CXR with wedge shaped small infiltrate on R side, UA with heavy pyuria. Small amount of pus drained  from distal button hole site, and he was here also this week for a spontaneous bleed from the same AVF. This raises question of infected access. Will ask VVS to see in am.  Plan HD tonight, hold Neurontin, hold narcotics and sedating meds, check VA level, on empiric abx per primary.  Kelly Splinter MD pager [phone]    cell [phone] 05/23/2014, 5:37 PM

## 2014-05-23 NOTE — Progress Notes (Signed)
Admission note:   Arrival Method: Via stretcher from ED.  Mental Status: A&OX3, occasionally confused.  Telemetry: N/A.  Skin: Intact. Lower extremities flaky.   Tubes: N/A. IV: RAC NSL 05/23/14. Pain: Denies.  Family: No one at bedside. Living Situation: From [facility]. Safety Measures: Bed alarm in place, call bell within reach, safety video viewed, fall prevention safety plan reviewed with patient. 6E Orientation: Oriented to unit and surroundings. Informed patient he'll be going to HD later today.  Patient had IV Vanco infusing upon arrival to unit. Patient scheduled to go to HD soon. Verified with Shelle Iron, NP that medication could be finished during HD. MRSA PCR sent per protocol.   Joellen Jersey, RN.

## 2014-05-23 NOTE — ED Provider Notes (Cosign Needed)
CSN: [id number]     Arrival date & time 05/23/14  1127 History   First MD Initiated Contact with Patient 05/23/14 1136     Chief Complaint  Patient presents with  . Altered Mental Status     (Consider location/radiation/quality/duration/timing/severity/associated sxs/prior Treatment) The history is provided by the patient and medical records.   This is a 73 year old male with past medical history significant for hypertension, hyperlipidemia, paroxysmal A. Fib, seizure disorder, diabetes, congestive heart failure, presenting to the ED for altered mental status. Patient was seen in the ED 4 days ago for the same and discharged back to his SNF.  Patient states he just feels generally weak and unwell.  Nursing staff reports that symptoms have not worsened, it has not seemed to improve.  Patient denies any numbness, weakness, or paresthesias of his extremities. Last dialysis was on Saturday, had an appointment today but staff sent him to the ED for evaluation instead. Patient denies any cough, fever, chills. Patient makes very little urine.  Past Medical History  Diagnosis Date  . ESRD on hemodialysis     Started HD around 2009, maybe longer.  Was living in [place], [place] then.  Moved to [place] in 2014 and gets HD now at [facility] on [address] on a TTS schedule.  ESRD was due to DM and HTN.     Marland Kitchen Peripheral vascular disease   . Hypertension   . Hyperlipidemia   . Arthritis     Gout  . Paroxysmal atrial fibrillation   . Brain tumor 2009  . Seizure disorder 2009  . Anemia   . Hyperparathyroidism, secondary   . Diabetes mellitus without complication     Type II, diet controlled  . Proteus mirabilis infection   . CHF (congestive heart failure)     grade 1 DD, preserved EF, [facility] records  . 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 brain tumor  . GERD (gastroesophageal reflux disease)   . Hx of cardiac cath     a. LHC (9/14):  Normal cors,  EF 65%.    Past Surgical History  Procedure Laterality Date  . Av fistula placement Left     arm  . 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 Eye surgery Bilateral 2000    cataract  . Retinopathy surgery      Hx. of laser treatments for diabetics   Family History  Problem Relation Age of Onset  . Seizures Mother   . Stroke Mother 90  . Hypertension Mother   . Alcohol abuse Father   . Hypertension Father   . Arthritis Sister   . Heart attack Neg Hx   . Heart failure Neg Hx    History  Substance Use Topics  . Smoking status: Never Smoker   . Smokeless tobacco: Never Used  . Alcohol Use: 0.6 oz/week    1 Shots of liquor per week    Review of Systems  Constitutional: Positive for fatigue.  Neurological: Positive for weakness (generalized).  All other systems reviewed and are negative.     Allergies  Review of patient's allergies indicates no known allergies.  Home Medications   Prior to Admission medications   Medication Sig Start Date End Date Taking? Authorizing Provider  acetaminophen (TYLENOL) 325 MG tablet Take 325 mg by mouth every 6 (six) hours as needed for moderate pain or fever.  Historical Provider, MD  aspirin EC 325 MG tablet Take 325 mg by mouth daily.    Historical Provider, MD  atorvastatin (LIPITOR) 20 MG tablet Take 20 mg by mouth at bedtime.     Historical Provider, MD  B Complex-C-Folic Acid (RENO CAPS) 1 MG CAPS Take 1 mg by mouth at bedtime.    Historical Provider, MD  carbidopa-levodopa (SINEMET IR) 25-100 MG per tablet Take 0.5 tablets by mouth 3 (three) times daily. For dialysis days, give 45 mins prior to HD tx for restless leg syndrome    Historical Provider, MD  Cholecalciferol (VITAMIN D3) 1000 UNITS CAPS Take 1 capsule by mouth daily.    Historical Provider, MD  divalproex (DEPAKOTE) 500 MG DR tablet Take 3 tablets (1,500 mg total) by mouth daily. 01/24/14   Ripudeep Krystal Eaton, MD   escitalopram (LEXAPRO) 10 MG tablet Take 1 tablet (10 mg total) by mouth daily. 01/24/14   Ripudeep Krystal Eaton, MD  gabapentin (NEURONTIN) 300 MG capsule Take 300 mg by mouth 2 (two) times daily.     Historical Provider, MD  Menthol, Topical Analgesic, (BIOFREEZE EX) Apply 1 application topically 4 (four) times daily as needed (For neck pain).    Historical Provider, MD  metoprolol succinate (TOPROL-XL) 25 MG 24 hr tablet Take 12.5 mg by mouth at bedtime.    Historical Provider, MD  nitroGLYCERIN (NITROSTAT) 0.4 MG SL tablet Place 1 tablet (0.4 mg total) under the tongue every 5 (five) minutes x 3 doses as needed for chest pain. 05/28/13   Nita Sells, MD  omeprazole (PRILOSEC OTC) 20 MG tablet Take 20 mg by mouth daily as needed (heartburn).     Historical Provider, MD  oxyCODONE-acetaminophen (PERCOCET/ROXICET) 5-325 MG per tablet Take 1 tablet by mouth every 6 (six) hours as needed for severe pain.    Historical Provider, MD  senna-docusate (SENOKOT-S) 8.6-50 MG per tablet Take 1 tablet by mouth at bedtime as needed for mild constipation.    Historical Provider, MD  simethicone (MYLICON) 80 MG chewable tablet Chew 80 mg by mouth 2 (two) times daily as needed for flatulence.    Historical Provider, MD  sucroferric oxyhydroxide (VELPHORO) 500 MG chewable tablet Chew 1,500 mg by mouth 3 (three) times daily with meals.    Historical Provider, MD   BP 156/65  Pulse 93  Temp(Src) 98.4 F (36.9 C) (Oral)  Resp 16  Ht 5' 8.9" (1.75 m)  Wt 260 lb 2.3 oz (118 kg)  BMI 38.53 kg/m2  SpO2 95%  Physical Exam  Nursing note and vitals reviewed. Constitutional: He is oriented to person, place, and time. He appears well-developed and well-nourished. No distress.  Appears generally weak  HENT:  Head: Normocephalic and atraumatic.  Mouth/Throat: Oropharynx is clear and moist.  Eyes: Conjunctivae and EOM are normal. Pupils are equal, round, and reactive to light.  Neck: Normal range of motion. Neck  supple.  Cardiovascular: Normal rate, regular rhythm and normal heart sounds.   Pulmonary/Chest: Effort normal and breath sounds normal. No respiratory distress. He has no wheezes.  Abdominal: Soft. Bowel sounds are normal. There is no tenderness. There is no guarding.  Musculoskeletal: Normal range of motion. He exhibits no edema.  AV fistula of left upper extremity with strong thrill present  Neurological: He is alert and oriented to person, place, and time.  AAOx3, answering questions and following commands appropriately; equal strength UE bilaterally; limited movement of LLE due to BKA; moving RLE without ataxia, normal strength; no facial  asymmetry noted; normal sensation to light though throughout  Skin: Skin is warm and dry. He is not diaphoretic.  Psychiatric: He has a normal mood and affect.    ED Course  Procedures (including critical care time) Labs Review Labs Reviewed  CBC - Abnormal; Notable for the following:    WBC 16.1 (*)    RBC 2.79 (*)    Hemoglobin 9.0 (*)    HCT 27.3 (*)    All other components within normal limits  COMPREHENSIVE METABOLIC PANEL - Abnormal; Notable for the following:    Sodium 136 (*)    Chloride 90 (*)    Glucose, Bld 180 (*)    BUN 94 (*)    Creatinine, Ser 12.63 (*)    Albumin 2.9 (*)    Total Bilirubin 0.2 (*)    GFR calc non Af Amer 3 (*)    GFR calc Af Amer 4 (*)    Anion gap 24 (*)    All other components within normal limits  URINALYSIS, ROUTINE W REFLEX MICROSCOPIC - Abnormal; Notable for the following:    APPearance TURBID (*)    Hgb urine dipstick LARGE (*)    Protein, ur 100 (*)    Leukocytes, UA LARGE (*)    All other components within normal limits  URINE MICROSCOPIC-ADD ON - Abnormal; Notable for the following:    Bacteria, UA MANY (*)    All other components within normal limits  URINE CULTURE  CULTURE, BLOOD (ROUTINE X 2)  CULTURE, BLOOD (ROUTINE X 2)  I-STAT TROPOININ, ED    Imaging Review Dg Chest 2  View  05/23/2014   CLINICAL DATA:  Weakness  EXAM: CHEST  2 VIEW  COMPARISON:  01/15/2014  FINDINGS: Cardiac shadow is mildly enlarged. The lungs are hypoinflated. Right basilar atelectasis is noted. Additionally some right mid lung infiltrate is seen. No acute bony abnormality is noted.  IMPRESSION: Right basilar atelectasis and right mid lung infiltrate.   Electronically Signed   By: Inez Catalina M.D.   On: 05/23/2014 12:26     EKG Interpretation None      MDM   Final diagnoses:  HCAP (healthcare-associated pneumonia)  UTI (lower urinary tract infection)  Generalized weakness  Diabetes mellitus without complication  ESRD (end stage renal disease) on dialysis   73 y.o. M sent here for AMS.  On arrival, patient is AAOx3.  He is following all commands and answering questions appropriately.  Recent CT head on 05/19/14 which was negative for acute findings.  Patient states he just feels very fatigued and weak all over.  He denies any specific symptoms.  Patient afebrile and non-toxic appearing.  He did miss dialysis today due to this ED visit.  Will obtain basic labs, u/a, CXR.  Labs with leukocytosis of 16.1.  SrCr slightly above patient's baseline, K+ remains WNL.  CXR with right basilar atelectasis and right mid infiltrate.  U/a appears infectious. Patient started on broad spectrum abx for coverage of HCAP as well as UTI.  Blood and urine cultures pending at this time.  Case discussed with hospitalist, Dr. Karleen Hampshire who will admit to med-surg.  Nephrology, Dr. Moshe Cipro will help arrange dialysis.  Temp admission orders placed, VS remain stable at this time.  Larene Pickett, PA-C 05/23/14 1421

## 2014-05-23 NOTE — Progress Notes (Addendum)
     HPI: This 73 y/o male was seen in the ER by Dr. Scot Dock on 05/18/2014 for fistula bleeding post dialysis.  A button hole technique was being used.  The distal button hole was stitched closed.  The patient still had a palpable thrill throughout the fistula.  He was discharged home.  He returned to the ER secondary to altered mental status.  We were called during his dialysis today due to left av fistula bleeding.  Pressure was held for 30 min.   Objective 154/56 85 98.8 F (37.1 C) (Oral) 16 97% No intake or output data in the 24 hours ending 05/23/14 2020  Left AV fistula palpable thrill Distal button hole uncovered, no bleeding occurred The nurse note there was no stitch in the distal button hole when dialysis was started   Assessment/Planning: Bleeding fistula left upper arm  They should resume dialysis.  We will check on him tomorrow  Laurence Slate [facility] 05/23/2014 8:20 PM --  Laboratory Lab Results:  Recent Labs  05/23/14 1156  WBC 16.1*  HGB 9.0*  HCT 27.3*  PLT 309   BMET  Recent Labs  05/23/14 1156  NA 136*  K 5.3  CL 90*  CO2 22  GLUCOSE 180*  BUN 94*  CREATININE 12.63*  CALCIUM 10.1    COAG Lab Results  Component Value Date   INR 0.97 05/26/2013   No results found for this basename: PTT      Patient now back on HD and tolerating it without difficulty.  Please call if further concerns.  Annamarie Major

## 2014-05-24 DIAGNOSIS — N186 End stage renal disease: Secondary | ICD-10-CM

## 2014-05-24 DIAGNOSIS — J189 Pneumonia, unspecified organism: Secondary | ICD-10-CM | POA: Diagnosis not present

## 2014-05-24 DIAGNOSIS — Z992 Dependence on renal dialysis: Secondary | ICD-10-CM

## 2014-05-24 DIAGNOSIS — I70269 Atherosclerosis of native arteries of extremities with gangrene, unspecified extremity: Secondary | ICD-10-CM

## 2014-05-24 DIAGNOSIS — E119 Type 2 diabetes mellitus without complications: Secondary | ICD-10-CM

## 2014-05-24 LAB — HIV ANTIBODY (ROUTINE TESTING W REFLEX): HIV: NONREACTIVE

## 2014-05-24 MED ORDER — DARBEPOETIN ALFA-POLYSORBATE 60 MCG/0.3ML IJ SOLN
60.0000 ug | INTRAMUSCULAR | Status: DC
  Administered 2014-06-01: 60 ug via INTRAVENOUS
  Filled 2014-05-24 (×2): qty 0.3

## 2014-05-24 MED ORDER — DEXTROSE 5 % IV SOLN
2.0000 g | INTRAVENOUS | Status: DC
  Filled 2014-05-24: qty 2

## 2014-05-24 MED ORDER — ESCITALOPRAM OXALATE 5 MG PO TABS
5.0000 mg | ORAL_TABLET | Freq: Every day | ORAL | Status: DC
  Administered 2014-05-24 – 2014-06-01 (×9): 5 mg via ORAL
  Filled 2014-05-24 (×9): qty 1

## 2014-05-24 NOTE — Progress Notes (Signed)
Chaplain responded to consult concerning AD. Pt was asleep. Will return tomorrow.  [name] [name] 05/24/2014 2:49 PM

## 2014-05-24 NOTE — Progress Notes (Signed)
ANTIBIOTIC CONSULT NOTE - FOLLOW UP  Pharmacy Consult for Vancomycin + Cefepime Indication: rule out pneumonia  No Known Allergies  Patient Measurements: Height: 5' 8.9" (175 cm) Weight: 264 lb 1.8 oz (119.8 kg) IBW/kg (Calculated) : 70.47  Vital Signs: Temp: 99.2 F (37.3 C) (09/09 0909) Temp src: Oral (09/09 0909) BP: 112/62 mmHg (09/09 0909) Pulse Rate: 90 (09/09 0909) Intake/Output from previous day: 09/08 0701 - 09/09 0700 In: -  Out: 3300  Intake/Output from this shift: Total I/O In: 200 [P.O.:200] Out: -   Labs:  Recent Labs  05/23/14 1156  WBC 16.1*  HGB 9.0*  PLT 309  CREATININE 12.63*   Estimated Creatinine Clearance: 6.5 ml/min (by C-G formula based on Cr of 12.63). No results found for this basename: VANCOTROUGH, VANCOPEAK, VANCORANDOM, GENTTROUGH, GENTPEAK, GENTRANDOM, TOBRATROUGH, TOBRAPEAK, TOBRARND, AMIKACINPEAK, AMIKACINTROU, AMIKACIN,  in the last 72 hours   Microbiology: Recent Results (from the past 720 hour(s))  CULTURE, BLOOD (ROUTINE X 2)     Status: None   Collection Time    05/23/14  1:17 PM      Result Value Ref Range Status   Specimen Description BLOOD HAND RIGHT   Final   Special Requests BOTTLES DRAWN AEROBIC ONLY 3CC   Final   Culture  Setup Time     Final   Value: 05/23/2014 16:42     Performed at [facility]   Culture     Final   Value:        BLOOD CULTURE RECEIVED NO GROWTH TO DATE CULTURE WILL BE HELD FOR 5 DAYS BEFORE ISSUING A FINAL NEGATIVE REPORT     Performed at [facility]   Report Status PENDING   Incomplete  CULTURE, BLOOD (ROUTINE X 2)     Status: None   Collection Time    05/23/14  2:15 PM      Result Value Ref Range Status   Specimen Description BLOOD RIGHT HAND   Final   Special Requests BOTTLES DRAWN AEROBIC AND ANAEROBIC 5CC   Final   Culture  Setup Time     Final   Value: 05/23/2014 18:52     Performed at [facility]   Culture     Final   Value:        BLOOD CULTURE RECEIVED  NO GROWTH TO DATE CULTURE WILL BE HELD FOR 5 DAYS BEFORE ISSUING A FINAL NEGATIVE REPORT     Performed at [facility]   Report Status PENDING   Incomplete  MRSA PCR SCREENING     Status: None   Collection Time    05/23/14  3:49 PM      Result Value Ref Range Status   MRSA by PCR NEGATIVE  NEGATIVE Final   Comment:            The GeneXpert MRSA Assay (FDA     approved for NASAL specimens     only), is one component of a     comprehensive MRSA colonization     surveillance program. It is not     intended to diagnose MRSA     infection nor to guide or     monitor treatment for     MRSA infections.    Anti-infectives   Start     Dose/Rate Route Frequency Ordered Stop   05/25/14 1200  vancomycin (VANCOCIN) IVPB 1000 mg/200 mL premix  Status:  Discontinued     1,000 mg 200 mL/hr over 60 Minutes Intravenous  Every T-Th-Sa (Hemodialysis) 05/23/14 1729 05/23/14 1752   05/25/14 1200  vancomycin (VANCOCIN) IVPB 1000 mg/200 mL premix     1,000 mg 200 mL/hr over 60 Minutes Intravenous Every T-Th-Sa (Hemodialysis) 05/23/14 1854     05/25/14 1200  ceFEPIme (MAXIPIME) 2 g in dextrose 5 % 50 mL IVPB     2 g 100 mL/hr over 30 Minutes Intravenous Every T-Th-Sa (Hemodialysis) 05/23/14 1855     05/23/14 2100  ceFEPIme (MAXIPIME) 2 g in dextrose 5 % 50 mL IVPB  Status:  Discontinued     2 g 100 mL/hr over 30 Minutes Intravenous Every T-Th-Sa (Hemodialysis) 05/23/14 1751 05/23/14 1855   05/23/14 2100  vancomycin (VANCOCIN) IVPB 1000 mg/200 mL premix  Status:  Discontinued     1,000 mg 200 mL/hr over 60 Minutes Intravenous Every T-Th-Sa (Hemodialysis) 05/23/14 1752 05/23/14 1854   05/23/14 1930  vancomycin (VANCOCIN) IVPB 1000 mg/200 mL premix  Status:  Discontinued     1,000 mg 200 mL/hr over 60 Minutes Intravenous To Hemodialysis 05/23/14 1854 05/23/14 1856   05/23/14 1930  ceFEPIme (MAXIPIME) 2 g in dextrose 5 % 50 mL IVPB     2 g 100 mL/hr over 30 Minutes Intravenous To Hemodialysis  05/23/14 1855 05/23/14 2318   05/23/14 1900  ceFEPIme (MAXIPIME) 2 g in dextrose 5 % 50 mL IVPB  Status:  Discontinued     2 g 100 mL/hr over 30 Minutes Intravenous Every T-Th-Sa (Hemodialysis) 05/23/14 1537 05/23/14 1751   05/23/14 1900  vancomycin (VANCOCIN) IVPB 1000 mg/200 mL premix  Status:  Discontinued     1,000 mg 200 mL/hr over 60 Minutes Intravenous Every T-Th-Sa (Hemodialysis) 05/23/14 1537 05/23/14 1729   05/23/14 1900  vancomycin (VANCOCIN) 2,500 mg in sodium chloride 0.9 % 500 mL IVPB     2,500 mg 250 mL/hr over 120 Minutes Intravenous To Hemodialysis 05/23/14 1856 05/24/14 0014   05/23/14 1830  vancomycin (VANCOCIN) 2,500 mg in sodium chloride 0.9 % 500 mL IVPB  Status:  Discontinued     2,500 mg 250 mL/hr over 120 Minutes Intravenous To Hemodialysis 05/23/14 1729 05/23/14 1853   05/23/14 1400  vancomycin (VANCOCIN) 2,500 mg in sodium chloride 0.9 % 500 mL IVPB     2,500 mg 250 mL/hr over 120 Minutes Intravenous  Once 05/23/14 1309 05/23/14 1530   05/23/14 1300  ceFEPIme (MAXIPIME) 1 g in dextrose 5 % 50 mL IVPB  Status:  Discontinued     1 g 100 mL/hr over 30 Minutes Intravenous  Once 05/23/14 1257 05/23/14 1535      Assessment: 59 YOM with ESRD who continues on Vancomycin + Cefepime for r/o PNA. The patient's loading dose was started in the [facility] on 9/8 but only infused for 30 minutes prior to going to HD - therefore the loading dose was repeated after HD. The patient also received a dose of Cefepime yesterday - will schedule additional Cefepime doses to be given after HD on dialysis days. Cultures are pending.  Goal of Therapy:  Pre-HD Vancomycin goal of 15-25 mcg/ml Proper antibiotics for infection/cultures adjusted for renal/hepatic function   Plan:  1. Continue Vancomycin 1g post HD-T/Th/Sat 2. Continue Cefepime 2g post HD-T/Th/Sat 3. Will continue to follow HD schedule/duration, culture results, LOT, and antibiotic de-escalation plans   Alycia Rossetti, PharmD,  BCPS Clinical Pharmacist Pager: [phone] 05/24/2014 10:24 AM

## 2014-05-24 NOTE — Progress Notes (Signed)
[facility] PROGRESS NOTE  [name] [name] QQV:[id number] DOB: [birth year] DOA: 05/23/2014 PCP: Donetta Potts, MD  Assessment/Plan: HCAP/PNA - R infiltrate on xray.  -continue Cefepime and Vanc per pharmacy -FU Blood cultures  UTI - continue Cefepime -FU urine Cx  Metabolic encephalopathy -due to 1,2, gabapentin, narcotics -Gabapentin and percocet held -if doesn't improve will check MRI brain  Spinal stenosis and cervical spondylosis -FU with Dr.Elsner -Narcotics on hold due to mentation'  Seizure d/o -continue Depakote, level slightly low  Tremors -continue carb/levo   ESRD  -HD per Renal  Hypertension/volume  -stable, continue TOprol  Anemia of chronic disease -Aranesp with HD   AVF bleeding yesterday -per Renal/VVS  DVt proph: lovenox  Code Status: Full Code Family Communication: no family at bedside Disposition Plan: SNF when stable   Consultants:  Renal  Antibiotics:  Cefepime/Vanc  HPI/Subjective: No complaints, remains drowsy  Objective: Filed Vitals:   05/24/14 0909  BP: 112/62  Pulse: 90  Temp: 99.2 F (37.3 C)  Resp: 18    Intake/Output Summary (Last 24 hours) at 05/24/14 0952 Last data filed at 05/24/14 0909  Gross per 24 hour  Intake    200 ml  Output   3300 ml  Net  -3100 ml   Filed Weights   05/23/14 1300 05/23/14 1845 05/23/14 2315  Weight: 118 kg (260 lb 2.3 oz) 123 kg (271 lb 2.7 oz) 119.8 kg (264 lb 1.8 oz)    Exam:   General:  Drowsy, but arousible, oriented to self only  Cardiovascular: S1S2/RRR  Respiratory: poor air movt  Abdomen: soft, Nt, BS present  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, L BKA  NEuro: moves all ext, no localizing signs   Data Reviewed: Basic Metabolic Panel:  Recent Labs Lab 05/18/14 1333 05/19/14 1514 05/23/14 1156  NA 140 140 136*  K 5.6* 5.7* 5.3  CL 94* 94* 90*  CO2 26 24 22   GLUCOSE 203* 110* 180*  BUN 70* 85* 94*  CREATININE 10.25* 12.55* 12.63*  CALCIUM 9.6 9.6  10.1   Liver Function Tests:  Recent Labs Lab 05/23/14 1156  AST 12  ALT <5  ALKPHOS 66  BILITOT 0.2*  PROT 7.7  ALBUMIN 2.9*   No results found for this basename: LIPASE, AMYLASE,  in the last 168 hours No results found for this basename: AMMONIA,  in the last 168 hours CBC:  Recent Labs Lab 05/18/14 1333 05/19/14 1514 05/23/14 1156  WBC 7.4 12.9* 16.1*  NEUTROABS 4.1  --   --   HGB 10.8* 10.8* 9.0*  HCT 32.2* 32.7* 27.3*  MCV 99.1 101.6* 97.8  PLT 285 273 309   Cardiac Enzymes: No results found for this basename: CKTOTAL, CKMB, CKMBINDEX, TROPONINI,  in the last 168 hours BNP (last 3 results)  Recent Labs  05/26/13 1700 01/15/14 1316  PROBNP 699.3* 1399.0*   CBG: No results found for this basename: GLUCAP,  in the last 168 hours  Recent Results (from the past 240 hour(s))  CULTURE, BLOOD (ROUTINE X 2)     Status: None   Collection Time    05/23/14  1:17 PM      Result Value Ref Range Status   Specimen Description BLOOD HAND RIGHT   Final   Special Requests BOTTLES DRAWN AEROBIC ONLY 3CC   Final   Culture  Setup Time     Final   Value: 05/23/2014 16:42     Performed at [facility]     Final  Value:        BLOOD CULTURE RECEIVED NO GROWTH TO DATE CULTURE WILL BE HELD FOR 5 DAYS BEFORE ISSUING A FINAL NEGATIVE REPORT     Performed at [facility]   Report Status PENDING   Incomplete  CULTURE, BLOOD (ROUTINE X 2)     Status: None   Collection Time    05/23/14  2:15 PM      Result Value Ref Range Status   Specimen Description BLOOD RIGHT HAND   Final   Special Requests BOTTLES DRAWN AEROBIC AND ANAEROBIC 5CC   Final   Culture  Setup Time     Final   Value: 05/23/2014 18:52     Performed at [facility]   Culture     Final   Value:        BLOOD CULTURE RECEIVED NO GROWTH TO DATE CULTURE WILL BE HELD FOR 5 DAYS BEFORE ISSUING A FINAL NEGATIVE REPORT     Performed at [facility]   Report Status PENDING    Incomplete  MRSA PCR SCREENING     Status: None   Collection Time    05/23/14  3:49 PM      Result Value Ref Range Status   MRSA by PCR NEGATIVE  NEGATIVE Final   Comment:            The GeneXpert MRSA Assay (FDA     approved for NASAL specimens     only), is one component of a     comprehensive MRSA colonization     surveillance program. It is not     intended to diagnose MRSA     infection nor to guide or     monitor treatment for     MRSA infections.     Studies: Dg Chest 2 View  05/23/2014   CLINICAL DATA:  Weakness  EXAM: CHEST  2 VIEW  COMPARISON:  01/15/2014  FINDINGS: Cardiac shadow is mildly enlarged. The lungs are hypoinflated. Right basilar atelectasis is noted. Additionally some right mid lung infiltrate is seen. No acute bony abnormality is noted.  IMPRESSION: Right basilar atelectasis and right mid lung infiltrate.   Electronically Signed   By: Inez Catalina M.D.   On: 05/23/2014 12:26    Scheduled Meds: . aspirin EC  325 mg Oral Daily  . atorvastatin  20 mg Oral QHS  . carbidopa-levodopa  0.5 tablet Oral TID  . [START ON 05/25/2014] ceFEPime (MAXIPIME) IV  2 g Intravenous Q T,Th,Sa-HD  . [START ON 05/30/2014] darbepoetin (ARANESP) injection - DIALYSIS  60 mcg Intravenous Q Tue-HD  . divalproex  1,500 mg Oral Daily  . enoxaparin (LOVENOX) injection  30 mg Subcutaneous Q24H  . escitalopram  10 mg Oral Daily  . Influenza vac split quadrivalent PF  0.5 mL Intramuscular Tomorrow-1000  . metoprolol succinate  12.5 mg Oral QHS  . multivitamin  1 tablet Oral QHS  . pneumococcal 23 valent vaccine  0.5 mL Intramuscular Tomorrow-1000  . sevelamer carbonate  3,200 mg Oral TID WC  . sucroferric oxyhydroxide  500 mg Oral Q supper  . [START ON 05/25/2014] vancomycin  1,000 mg Intravenous Q T,Th,Sa-HD   Continuous Infusions:  Antibiotics Given (last 72 hours)   Date/Time Action Medication Dose Rate   05/23/14 2214 Given   vancomycin (VANCOCIN) 2,500 mg in sodium chloride 0.9 % 500  mL IVPB 2,500 mg 250 mL/hr   05/23/14 2248 Given   ceFEPIme (MAXIPIME) 2 g in dextrose 5 %  50 mL IVPB 2 g 100 mL/hr      Active Problems:   HCAP (healthcare-associated pneumonia)    Time spent: 19min    Lue Sykora  [facility] Pager [phone]. If 7PM-7AM, please contact night-coverage at [email or web address], password [facility] 05/24/2014, 9:52 AM  LOS: 1 day

## 2014-05-24 NOTE — Progress Notes (Signed)
Subjective:   Had a good night, still feeling very tired.   Objective Filed Vitals:   05/23/14 2315 05/23/14 2345 05/24/14 0557 05/24/14 0909  BP: 133/71 134/106 117/62 112/62  Pulse: 91 95 93 90  Temp: 99.1 F (37.3 C) 99.3 F (37.4 C) 100.2 F (37.9 C) 99.2 F (37.3 C)  TempSrc: Oral Oral Oral Oral  Resp: 16 18 17 18   Height:      Weight: 119.8 kg (264 lb 1.8 oz)     SpO2:  97% 94% 97%   Physical Exam General: alert, lethargic, confused. No acute distress Heart: RRR, no murmur Lungs: CTA, unlabored.  Abdomen: soft, notnender +BS Extremities: L BKA. RLE trace edema Dialysis Access:  L AVF + b/t   Dialysis Orders: [facility] 4 hr 30 mins 118kg 2K/2Ca+ 350/8000 6000 Heparin L AVF  hectorol 4 mcg Aranesp 50 q week No Fe  Assessment/Plan:  1. PNA- R infiltrate on xray. Cefepime and Vanc per pharm. Blood cultures- NGTD. Afebrile. Sputum culture pending.  2. UTI- culture pending. antibx per pharm. Has had AMS in the past (may 2015) secondary to UTI, +Ecoli and was treated with Rocephin 3. Tremor- Head CT- no acute event on 9/4. Follows with neurology, last appt 8/31- continue divalproex for seizure and carb/levo for tremor  4. ESRD - TTS [facility] K+ 5.3 HD tomorrow, 5. Hypertension/volume - 112/62, under edw last tx, lower. Metop.  6. Anemia -hgb 9 down from baseline of 10, ESA q Thursday, missed last weeks dose- given 9/8. No Fe, last tsat 28. 7. Metabolic bone disease - NT+70.0/17 corrected. Hold hectorol. 2 Ca+ bath. - last phos 8.9 (8/27) PTH 391 (7/23) Cont binders - renvela and velphoro 8. Nutrition - alb 2.9. Multi vit renal diet.  9. AVF bleeding- VVS following, some bleeding last night.  10. Spinal stenosis   Shelle Iron, NP [place] [phone] 05/24/2014,9:45 AM  LOS: 1 day    Additional Objective Labs: Basic Metabolic Panel:  Recent Labs Lab 05/18/14 1333 05/19/14 1514 05/23/14 1156  NA 140 140 136*  K 5.6* 5.7* 5.3  CL 94*  94* 90*  CO2 26 24 22   GLUCOSE 203* 110* 180*  BUN 70* 85* 94*  CREATININE 10.25* 12.55* 12.63*  CALCIUM 9.6 9.6 10.1   Liver Function Tests:  Recent Labs Lab 05/23/14 1156  AST 12  ALT <5  ALKPHOS 66  BILITOT 0.2*  PROT 7.7  ALBUMIN 2.9*   No results found for this basename: LIPASE, AMYLASE,  in the last 168 hours CBC:  Recent Labs Lab 05/18/14 1333 05/19/14 1514 05/23/14 1156  WBC 7.4 12.9* 16.1*  NEUTROABS 4.1  --   --   HGB 10.8* 10.8* 9.0*  HCT 32.2* 32.7* 27.3*  MCV 99.1 101.6* 97.8  PLT 285 273 309   Blood Culture    Component Value Date/Time   SDES BLOOD RIGHT HAND 05/23/2014 1415   SPECREQUEST BOTTLES DRAWN AEROBIC AND ANAEROBIC 5CC 05/23/2014 1415   CULT  Value:        BLOOD CULTURE RECEIVED NO GROWTH TO DATE CULTURE WILL BE HELD FOR 5 DAYS BEFORE ISSUING A FINAL NEGATIVE REPORT Performed at [facility] 05/23/2014 1415   REPTSTATUS PENDING 05/23/2014 1415    Cardiac Enzymes: No results found for this basename: CKTOTAL, CKMB, CKMBINDEX, TROPONINI,  in the last 168 hours CBG: No results found for this basename: GLUCAP,  in the last 168 hours Iron Studies: No results found for this  basename: IRON, TIBC, TRANSFERRIN, FERRITIN,  in the last 72 hours @lablastinr3 @ Studies/Results: Dg Chest 2 View  05/23/2014   CLINICAL DATA:  Weakness  EXAM: CHEST  2 VIEW  COMPARISON:  01/15/2014  FINDINGS: Cardiac shadow is mildly enlarged. The lungs are hypoinflated. Right basilar atelectasis is noted. Additionally some right mid lung infiltrate is seen. No acute bony abnormality is noted.  IMPRESSION: Right basilar atelectasis and right mid lung infiltrate.   Electronically Signed   By: Inez Catalina M.D.   On: 05/23/2014 12:26   Medications:   . aspirin EC  325 mg Oral Daily  . atorvastatin  20 mg Oral QHS  . carbidopa-levodopa  0.5 tablet Oral TID  . [START ON 05/25/2014] ceFEPime (MAXIPIME) IV  2 g Intravenous Q T,Th,Sa-HD  . [START ON 05/30/2014] darbepoetin (ARANESP)  injection - DIALYSIS  60 mcg Intravenous Q Tue-HD  . divalproex  1,500 mg Oral Daily  . enoxaparin (LOVENOX) injection  30 mg Subcutaneous Q24H  . escitalopram  10 mg Oral Daily  . Influenza vac split quadrivalent PF  0.5 mL Intramuscular Tomorrow-1000  . metoprolol succinate  12.5 mg Oral QHS  . multivitamin  1 tablet Oral QHS  . pneumococcal 23 valent vaccine  0.5 mL Intramuscular Tomorrow-1000  . sevelamer carbonate  3,200 mg Oral TID WC  . sucroferric oxyhydroxide  500 mg Oral Q supper  . [START ON 05/25/2014] vancomycin  1,000 mg Intravenous Q T,Th,Sa-HD

## 2014-05-24 NOTE — Progress Notes (Signed)
INITIAL NUTRITION ASSESSMENT  DOCUMENTATION CODES Per approved criteria  -Morbid Obesity   INTERVENTION:  Provide Nepro Shake po TID, each supplement provides 425 kcal and 19 grams protein  Encourage PO intake  Will continue to monitor  NUTRITION DIAGNOSIS: Inadequate oral intake related to altered mental status as evidenced by poor Po intake 25%.   Goal: Pt to meet >/= 90% of their estimated nutrition needs   Monitor:  PO and supplemental intake, weight, labs, I/O's  Reason for Assessment: Pt identified as at nutrition risk on the Malnutrition Screen Tool   Admitting Dx: <principal problem not specified>  ASSESSMENT: 73 y.o. male with prior h/o ESRD ON HD due for HD today , was brought in for confusion. On arrival to ED, pt was found to have right sided pneumonia and a urinary tract infection. He is confused.  Most of the history obtained from EDP. He reports having neck pain for several months and wants pain medications . He denies any other complaints. He was referred to medical service for management of HCAP.   Pt with altered mental status, was unable to obtain history, no family was present during visit. Per RN, patient will only eat when encouraged. PO intake this AM 25%. Suggested sending Nepro shakes and RN agreed that he may drink them. Per documentation, wt has decreased by 12 lbs since 8/31, question if this is d/t fluid.  Unable to perform Nutrition Focused Physical Exam d/t altered mental status.  Labs reviewed: Low Na  Height: Ht Readings from Last 1 Encounters:  05/23/14 5' 8.9" (1.75 m)    Weight: Wt Readings from Last 1 Encounters:  05/23/14 264 lb 1.8 oz (119.8 kg)  Dry Weight     260 lbs  Ideal Body Weight: 150 lb (adjusted for BKA)  % Ideal Body Weight: 176%  Wt Readings from Last 10 Encounters:  05/23/14 264 lb 1.8 oz (119.8 kg)  05/19/14 262 lb (118.842 kg)  05/18/14 262 lb (118.842 kg)  05/15/14 274 lb (124.286 kg)  01/24/14 254 lb  3.1 oz (115.3 kg)  12/18/13 260 lb 2.3 oz (118 kg)  09/22/13 265 lb (120.203 kg)  08/16/13 253 lb (114.76 kg)  08/09/13 258 lb (117.028 kg)  06/22/13 249 lb 9 oz (113.2 kg)    Usual Body Weight: variable   % Usual Body Weight: NA  BMI:  41.5% (adjusted for BKA)  Estimated Nutritional Needs: Kcal: 2500-2600 Protein: 100-110g Fluid: 2.5L/day  Skin: intact, RLE & LLE edema  Diet Order: Renal  EDUCATION NEEDS: -No education needs identified at this time   Intake/Output Summary (Last 24 hours) at 05/24/14 0950 Last data filed at 05/24/14 0909  Gross per 24 hour  Intake    200 ml  Output   3300 ml  Net  -3100 ml    Last BM: 9/8  Labs:   Recent Labs Lab 05/18/14 1333 05/19/14 1514 05/23/14 1156  NA 140 140 136*  K 5.6* 5.7* 5.3  CL 94* 94* 90*  CO2 26 24 22   BUN 70* 85* 94*  CREATININE 10.25* 12.55* 12.63*  CALCIUM 9.6 9.6 10.1  GLUCOSE 203* 110* 180*    CBG (last 3)  No results found for this basename: GLUCAP,  in the last 72 hours  Scheduled Meds: . aspirin EC  325 mg Oral Daily  . atorvastatin  20 mg Oral QHS  . carbidopa-levodopa  0.5 tablet Oral TID  . [START ON 05/25/2014] ceFEPime (MAXIPIME) IV  2 g Intravenous Q T,Th,Sa-HD  . [  START ON 05/30/2014] darbepoetin (ARANESP) injection - DIALYSIS  60 mcg Intravenous Q Tue-HD  . divalproex  1,500 mg Oral Daily  . enoxaparin (LOVENOX) injection  30 mg Subcutaneous Q24H  . escitalopram  10 mg Oral Daily  . Influenza vac split quadrivalent PF  0.5 mL Intramuscular Tomorrow-1000  . metoprolol succinate  12.5 mg Oral QHS  . multivitamin  1 tablet Oral QHS  . pneumococcal 23 valent vaccine  0.5 mL Intramuscular Tomorrow-1000  . sevelamer carbonate  3,200 mg Oral TID WC  . sucroferric oxyhydroxide  500 mg Oral Q supper  . [START ON 05/25/2014] vancomycin  1,000 mg Intravenous Q T,Th,Sa-HD    Continuous Infusions:   Past Medical History  Diagnosis Date  . ESRD on hemodialysis     Started HD around 2009,  maybe longer.  Was living in [place], [place] then.  Moved to [place] in 2014 and gets HD now at [facility] on [address] on a TTS schedule.  ESRD was due to DM and HTN.     Marland Kitchen Peripheral vascular disease   . Hypertension   . Hyperlipidemia   . Arthritis     Gout  . Paroxysmal atrial fibrillation   . Brain tumor 2009  . Seizure disorder 2009  . Anemia   . Hyperparathyroidism, secondary   . Diabetes mellitus without complication     Type II, diet controlled  . Proteus mirabilis infection   . CHF (congestive heart failure)     grade 1 DD, preserved EF, [facility] records  . 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 brain tumor  . GERD (gastroesophageal reflux disease)   . Hx of cardiac cath     a. LHC (9/14):  Normal cors, EF 65%.     Past Surgical History  Procedure Laterality Date  . Av fistula placement Left     arm  . 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 Eye surgery Bilateral 2000    cataract  . Retinopathy surgery      Hx. of laser treatments for diabetics    Clayton Bibles, MS, [place] Licensed Dietitian Nutritionist Pager: [phone]

## 2014-05-24 NOTE — Progress Notes (Signed)
I saw the patient and agree with the above assessment and plan.     Pt somnolent this AM.  Prolonged bleeding from AVF last evening noted.  VVS following, no recurrence since and HD uneventful.

## 2014-05-25 ENCOUNTER — Encounter (HOSPITAL_COMMUNITY): Payer: Self-pay | Admitting: Infectious Diseases

## 2014-05-25 DIAGNOSIS — A4901 Methicillin susceptible Staphylococcus aureus infection, unspecified site: Secondary | ICD-10-CM

## 2014-05-25 DIAGNOSIS — R7881 Bacteremia: Secondary | ICD-10-CM

## 2014-05-25 DIAGNOSIS — I6350 Cerebral infarction due to unspecified occlusion or stenosis of unspecified cerebral artery: Secondary | ICD-10-CM

## 2014-05-25 LAB — BASIC METABOLIC PANEL
ANION GAP: 22 — AB (ref 5–15)
BUN: 79 mg/dL — ABNORMAL HIGH (ref 6–23)
CALCIUM: 10.1 mg/dL (ref 8.4–10.5)
CO2: 24 mEq/L (ref 19–32)
CREATININE: 10.5 mg/dL — AB (ref 0.50–1.35)
Chloride: 92 mEq/L — ABNORMAL LOW (ref 96–112)
GFR, EST AFRICAN AMERICAN: 5 mL/min — AB (ref >90)
GFR, EST NON AFRICAN AMERICAN: 4 mL/min — AB (ref >90)
Glucose, Bld: 199 mg/dL — ABNORMAL HIGH (ref 70–99)
Potassium: 5.3 mEq/L (ref 3.7–5.3)
Sodium: 138 mEq/L (ref 137–147)

## 2014-05-25 LAB — CBC
HCT: 30 % — ABNORMAL LOW (ref 39.0–52.0)
Hemoglobin: 10 g/dL — ABNORMAL LOW (ref 13.0–17.0)
MCH: 33.4 pg (ref 26.0–34.0)
MCHC: 33.3 g/dL (ref 30.0–36.0)
MCV: 100.3 fL — AB (ref 78.0–100.0)
PLATELETS: 201 10*3/uL (ref 150–400)
RBC: 2.99 MIL/uL — ABNORMAL LOW (ref 4.22–5.81)
RDW: 14.5 % (ref 11.5–15.5)
WBC: 17.7 10*3/uL — ABNORMAL HIGH (ref 4.0–10.5)

## 2014-05-25 MED ORDER — NEPRO/CARBSTEADY PO LIQD: 237.0000 mL | ORAL | Status: DC | PRN

## 2014-05-25 MED ORDER — HEPARIN SODIUM (PORCINE) 1000 UNIT/ML DIALYSIS: 1000.0000 [IU] | INTRAMUSCULAR | Status: DC | PRN

## 2014-05-25 MED ORDER — ALTEPLASE 2 MG IJ SOLR
2.0000 mg | Freq: Once | INTRAMUSCULAR | Status: DC | PRN
  Filled 2014-05-25: qty 2

## 2014-05-25 MED ORDER — LIDOCAINE HCL (PF) 1 % IJ SOLN: 5.0000 mL | INTRAMUSCULAR | Status: DC | PRN

## 2014-05-25 MED ORDER — ACETAMINOPHEN 325 MG PO TABS
ORAL_TABLET | ORAL | Status: AC
  Administered 2014-05-25: 325 mg
  Filled 2014-05-25: qty 1

## 2014-05-25 MED ORDER — PENTAFLUOROPROP-TETRAFLUOROETH EX AERO: 1.0000 "application " | INHALATION_SPRAY | CUTANEOUS | Status: DC | PRN

## 2014-05-25 MED ORDER — SODIUM CHLORIDE 0.9 % IV SOLN: 100.0000 mL | INTRAVENOUS | Status: DC | PRN

## 2014-05-25 MED ORDER — DARBEPOETIN ALFA-POLYSORBATE 60 MCG/0.3ML IJ SOLN
INTRAMUSCULAR | Status: AC
  Administered 2014-05-25: 60 ug
  Filled 2014-05-25: qty 0.3

## 2014-05-25 MED ORDER — LIDOCAINE-PRILOCAINE 2.5-2.5 % EX CREA: 1.0000 "application " | TOPICAL_CREAM | CUTANEOUS | Status: DC | PRN

## 2014-05-25 MED ORDER — HEPARIN SODIUM (PORCINE) 1000 UNIT/ML DIALYSIS: 6000.0000 [IU] | Freq: Once | INTRAMUSCULAR | Status: DC

## 2014-05-25 MED ORDER — NEPRO/CARBSTEADY PO LIQD
237.0000 mL | Freq: Three times a day (TID) | ORAL | Status: DC
  Administered 2014-05-26 – 2014-06-01 (×11): 237 mL via ORAL

## 2014-05-25 NOTE — Consult Note (Addendum)
[name] [name] for Infectious Disease  Date of Admission:  05/23/2014  Date of Consult:  05/25/2014  Reason for Consult: Bacteremia Referring Physician: CHAMP  Impression/Recommendation Bacteremia/Staph aureus E coli in UCx ESRD Spinal Pain- hx of cervical spondylosis R hip pain   Would- Await repeat BCx No change in anbx for now. Investigate HD fistula? Check TEE consider MRI of R hip   Comment- Interpreting UCx in patients who do not make urine is often difficult. They are often colonized.  His hip may be septic.   Thank you so much for this interesting consult,   [name] [name] (pager) [phone] [email or web address]  [name] [name] is an 73 y.o. male.  HPI: 73 yo M with hx of DM2, ESRD was brought to [facility] on 9-8 after becoming confused in HD. He was found to have RML infitrate and R basilar atx on CXR and pyuria on UA. WBC was 16.1.   He was unable to give further hx in ED but did note neck and back pain for several months.  He was started on vanco/cefepime.  He is now found to have Staph aureus bacteremia.   Past Medical History  Diagnosis Date  . ESRD on hemodialysis     Started HD around 2009, maybe longer.  Was living in [place], [place] then.  Moved to [place] in 2014 and gets HD now at [facility] on [address] on a TTS schedule.  ESRD was due to DM and HTN.     Marland Kitchen Peripheral vascular disease   . Hypertension   . Hyperlipidemia   . Arthritis     Gout  . Paroxysmal atrial fibrillation   . Brain tumor 2009  . Seizure disorder 2009  . Anemia   . Hyperparathyroidism, secondary   . Diabetes mellitus without complication     Type II, diet controlled  . Proteus mirabilis infection   . CHF (congestive heart failure)     grade 1 DD, preserved EF, [facility] records  . 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 brain tumor  . GERD (gastroesophageal reflux disease)   . Hx of cardiac cath     a. LHC (9/14):   Normal cors, EF 65%.     Past Surgical History  Procedure Laterality Date  . Av fistula placement Left     arm  . 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 Eye surgery Bilateral 2000    cataract  . Retinopathy surgery      Hx. of laser treatments for diabetics     No Known Allergies  Medications:  Scheduled: . aspirin EC  325 mg Oral Daily  . atorvastatin  20 mg Oral QHS  . carbidopa-levodopa  0.5 tablet Oral TID  . ceFEPime (MAXIPIME) IV  2 g Intravenous Q T,Th,Sat-1800  . darbepoetin (ARANESP) injection - DIALYSIS  60 mcg Intravenous Q Thu-HD  . divalproex  1,500 mg Oral Daily  . enoxaparin (LOVENOX) injection  30 mg Subcutaneous Q24H  . escitalopram  5 mg Oral Daily  . feeding supplement (NEPRO CARB STEADY)  237 mL Oral TID WC  . heparin  6,000 Units Dialysis Once in dialysis  . Influenza vac split quadrivalent PF  0.5 mL Intramuscular Tomorrow-1000  . metoprolol succinate  12.5 mg Oral QHS  . multivitamin  1 tablet Oral QHS  . sevelamer carbonate  3,200 mg Oral TID WC  .  sucroferric oxyhydroxide  500 mg Oral Q supper  . vancomycin  1,000 mg Intravenous Q T,Th,Sa-HD    Abtx:  Anti-infectives   Start     Dose/Rate Route Frequency Ordered Stop   05/25/14 1800  ceFEPIme (MAXIPIME) 2 g in dextrose 5 % 50 mL IVPB     2 g 100 mL/hr over 30 Minutes Intravenous Every T-Th-Sa (1800) 05/24/14 1023     05/25/14 1200  vancomycin (VANCOCIN) IVPB 1000 mg/200 mL premix  Status:  Discontinued     1,000 mg 200 mL/hr over 60 Minutes Intravenous Every T-Th-Sa (Hemodialysis) 05/23/14 1729 05/23/14 1752   05/25/14 1200  vancomycin (VANCOCIN) IVPB 1000 mg/200 mL premix     1,000 mg 200 mL/hr over 60 Minutes Intravenous Every T-Th-Sa (Hemodialysis) 05/23/14 1854     05/25/14 1200  ceFEPIme (MAXIPIME) 2 g in dextrose 5 % 50 mL IVPB  Status:  Discontinued     2 g 100 mL/hr over 30 Minutes Intravenous Every T-Th-Sa (Hemodialysis) 05/23/14  1855 05/24/14 1023   05/23/14 2100  ceFEPIme (MAXIPIME) 2 g in dextrose 5 % 50 mL IVPB  Status:  Discontinued     2 g 100 mL/hr over 30 Minutes Intravenous Every T-Th-Sa (Hemodialysis) 05/23/14 1751 05/23/14 1855   05/23/14 2100  vancomycin (VANCOCIN) IVPB 1000 mg/200 mL premix  Status:  Discontinued     1,000 mg 200 mL/hr over 60 Minutes Intravenous Every T-Th-Sa (Hemodialysis) 05/23/14 1752 05/23/14 1854   05/23/14 1930  vancomycin (VANCOCIN) IVPB 1000 mg/200 mL premix  Status:  Discontinued     1,000 mg 200 mL/hr over 60 Minutes Intravenous To Hemodialysis 05/23/14 1854 05/23/14 1856   05/23/14 1930  ceFEPIme (MAXIPIME) 2 g in dextrose 5 % 50 mL IVPB     2 g 100 mL/hr over 30 Minutes Intravenous To Hemodialysis 05/23/14 1855 05/23/14 2318   05/23/14 1900  ceFEPIme (MAXIPIME) 2 g in dextrose 5 % 50 mL IVPB  Status:  Discontinued     2 g 100 mL/hr over 30 Minutes Intravenous Every T-Th-Sa (Hemodialysis) 05/23/14 1537 05/23/14 1751   05/23/14 1900  vancomycin (VANCOCIN) IVPB 1000 mg/200 mL premix  Status:  Discontinued     1,000 mg 200 mL/hr over 60 Minutes Intravenous Every T-Th-Sa (Hemodialysis) 05/23/14 1537 05/23/14 1729   05/23/14 1900  vancomycin (VANCOCIN) 2,500 mg in sodium chloride 0.9 % 500 mL IVPB     2,500 mg 250 mL/hr over 120 Minutes Intravenous To Hemodialysis 05/23/14 1856 05/24/14 0014   05/23/14 1830  vancomycin (VANCOCIN) 2,500 mg in sodium chloride 0.9 % 500 mL IVPB  Status:  Discontinued     2,500 mg 250 mL/hr over 120 Minutes Intravenous To Hemodialysis 05/23/14 1729 05/23/14 1853   05/23/14 1400  vancomycin (VANCOCIN) 2,500 mg in sodium chloride 0.9 % 500 mL IVPB     2,500 mg 250 mL/hr over 120 Minutes Intravenous  Once 05/23/14 1309 05/23/14 1530   05/23/14 1300  ceFEPIme (MAXIPIME) 1 g in dextrose 5 % 50 mL IVPB  Status:  Discontinued     1 g 100 mL/hr over 30 Minutes Intravenous  Once 05/23/14 1257 05/23/14 1535      Total days of antibiotics: 3  (vanco/cefepime)          Social History:  reports that he has never smoked. He has never used smokeless tobacco. He reports that he drinks about .6 ounces of alcohol per week. He reports that he does not use illicit drugs.  Family History  Problem Relation Age of Onset  . Seizures Mother   . Stroke Mother 49  . Hypertension Mother   . Alcohol abuse Father   . Hypertension Father   . Arthritis Sister   . Heart attack Neg Hx   . Heart failure Neg Hx     General ROS: pt is not able to answer questions appropriately.   Blood pressure 136/50, pulse 103, temperature 98.9 F (37.2 C), temperature source Oral, resp. rate 18, height 5' 8.9" (1.75 m), weight 119.8 kg (264 lb 1.8 oz), SpO2 97.00%. General appearance: alert, delirious and no distress Eyes: negative findings: pupils equal, round, reactive to light and accomodation Throat: normal findings: oropharynx pink & moist without lesions or evidence of thrush Neck: no adenopathy and supple, symmetrical, trachea midline Lungs: clear to auscultation bilaterally Heart: regular rate and rhythm Abdomen: normal findings: bowel sounds normal and soft, non-tender Extremities: L BKA, no lesions on R foot, L UE HD fistula clean, non-tender. R hip very tender with rotation.  MSE- 1/3 (person)   Results for orders placed during the hospital encounter of 05/23/14 (from the past 48 hour(s))  VALPROIC ACID LEVEL     Status: Abnormal   Collection Time    05/23/14  7:40 PM      Result Value Ref Range   Valproic Acid Lvl 33.7 (*) 50.0 - 100.0 ug/mL  CBC     Status: Abnormal   Collection Time    05/25/14 12:49 PM      Result Value Ref Range   WBC 17.7 (*) 4.0 - 10.5 K/uL   RBC 2.99 (*) 4.22 - 5.81 MIL/uL   Hemoglobin 10.0 (*) 13.0 - 17.0 g/dL   HCT 30.0 (*) 39.0 - 52.0 %   MCV 100.3 (*) 78.0 - 100.0 fL   MCH 33.4  26.0 - 34.0 pg   MCHC 33.3  30.0 - 36.0 g/dL   RDW 14.5  11.5 - 15.5 %   Platelets 201  150 - 400 K/uL  BASIC METABOLIC PANEL      Status: Abnormal   Collection Time    05/25/14 12:49 PM      Result Value Ref Range   Sodium 138  137 - 147 mEq/L   Potassium 5.3  3.7 - 5.3 mEq/L   Chloride 92 (*) 96 - 112 mEq/L   CO2 24  19 - 32 mEq/L   Glucose, Bld 199 (*) 70 - 99 mg/dL   BUN 79 (*) 6 - 23 mg/dL   Creatinine, Ser 10.50 (*) 0.50 - 1.35 mg/dL   Calcium 10.1  8.4 - 10.5 mg/dL   GFR calc non Af Amer 4 (*) >90 mL/min   GFR calc Af Amer 5 (*) >90 mL/min   Comment: (NOTE)     The eGFR has been calculated using the CKD EPI equation.     This calculation has not been validated in all clinical situations.     eGFR's persistently <90 mL/min signify possible Chronic Kidney     Disease.   Anion gap 22 (*) 5 - 15      Component Value Date/Time   SDES BLOOD RIGHT HAND 05/23/2014 1415   SPECREQUEST BOTTLES DRAWN AEROBIC AND ANAEROBIC 5CC 05/23/2014 1415   CULT  Value: STAPHYLOCOCCUS AUREUS Note: Gram Stain Report Called to,Read Back By and Verified With: JULIA OSBORNE ON 05/24/2014 AT 8:52P BY WILEJ Performed at [facility] 05/23/2014 1415   REPTSTATUS PENDING 05/23/2014 1415   No results found. Recent Results (from  the past 240 hour(s))  URINE CULTURE     Status: None   Collection Time    05/23/14 11:50 AM      Result Value Ref Range Status   Specimen Description URINE, RANDOM   Final   Special Requests NONE   Final   Culture  Setup Time     Final   Value: 05/23/2014 17:20     Performed at [facility]     Final   Value: >=100,000 COLONIES/ML     Performed at [facility]   Culture     Final   Value: ESCHERICHIA COLI     Performed at [facility]   Report Status PENDING   Incomplete  CULTURE, BLOOD (ROUTINE X 2)     Status: None   Collection Time    05/23/14  1:17 PM      Result Value Ref Range Status   Specimen Description BLOOD HAND RIGHT   Final   Special Requests BOTTLES DRAWN AEROBIC ONLY 3CC   Final   Culture  Setup Time     Final   Value: 05/23/2014 16:42      Performed at [facility]   Culture     Final   Value: STAPHYLOCOCCUS AUREUS     Note: RIFAMPIN AND GENTAMICIN SHOULD NOT BE USED AS SINGLE DRUGS FOR TREATMENT OF STAPH INFECTIONS.     Note: Gram Stain Report Called to,Read Back By and Verified With: BONITA@12 :50PM ON 05/24/14 BY DANTS     Performed at [facility]   Report Status PENDING   Incomplete  CULTURE, BLOOD (ROUTINE X 2)     Status: None   Collection Time    05/23/14  2:15 PM      Result Value Ref Range Status   Specimen Description BLOOD RIGHT HAND   Final   Special Requests BOTTLES DRAWN AEROBIC AND ANAEROBIC 5CC   Final   Culture  Setup Time     Final   Value: 05/23/2014 18:52     Performed at [facility]   Culture     Final   Value: STAPHYLOCOCCUS AUREUS     Note: Gram Stain Report Called to,Read Back By and Verified With: JULIA OSBORNE ON 05/24/2014 AT 8:52P BY WILEJ     Performed at [facility]   Report Status PENDING   Incomplete  MRSA PCR SCREENING     Status: None   Collection Time    05/23/14  3:49 PM      Result Value Ref Range Status   MRSA by PCR NEGATIVE  NEGATIVE Final   Comment:            The GeneXpert MRSA Assay (FDA     approved for NASAL specimens     only), is one component of a     comprehensive MRSA colonization     surveillance program. It is not     intended to diagnose MRSA     infection nor to guide or     monitor treatment for     MRSA infections.      05/25/2014, 5:27 PM     LOS: 2 days

## 2014-05-25 NOTE — Progress Notes (Signed)
Date Medicare IM given:  05/25/2014 Medicare IM given by:  [facility]

## 2014-05-25 NOTE — Progress Notes (Signed)
Subjective:   Tired. Confused.   Objective Filed Vitals:   05/24/14 0909 05/24/14 2134 05/25/14 0515 05/25/14 0942  BP: 112/62 96/43 118/57 134/57  Pulse: 90 85 81 79  Temp: 99.2 F (37.3 C) 99.6 F (37.6 C) 99.4 F (37.4 C) 99 F (37.2 C)  TempSrc: Oral Oral Oral Oral  Resp: 18 19 18 19   Height:      Weight:      SpO2: 97% 93% 100% 94%   Physical Exam General: alert, no acute distress. Oriented to person and place only Heart: RRR. No murmur Lungs: CTA, unlabored Abdomen: soft. nontender +BS Extremities: L BKA.  Dialysis Access: L AVF + b/t  [facility]  4 hr 30 mins 118kg 2K/2Ca+ 350/8000 6000 Heparin L AVF  hectorol 4 mcg Aranesp 50 q week No Fe  Assessment/Plan:  1. PNA- R infiltrate on xray. Cefepime and Vanc per pharm. Blood cultures- staph aureus. Afebrile. Sputum culture pending. 2D echo pending.  2. UTI- culture - Ecoli. antibx per pharm. Has had AMS in the past (may 2015) secondary to UTI, +Ecoli and was treated with Rocephin 3. Tremor- Head CT- no acute event on 9/4. Follows with neurology, last appt 8/31- continue divalproex for seizure and carb/levo for tremor  4. ESRD - [facility] K+ 5.3 HD today 5. Hypertension/volume - 134/57, under edw last tx, lower. Metop.  6. Anemia -hgb 9 down from baseline of 10, ESA q Thursday, missed last weeks dose- given 9/8. No Fe, last tsat 28. 7. Metabolic bone disease - FU+93.2/35 corrected. Hold hectorol. 2 Ca+ bath. - last phos 8.9 (8/27) PTH 391 (7/23) Cont binders - renvela and velphoro 8. Nutrition - alb 2.9. Multi vit renal diet.  9. AVF bleeding- VVS following,  bleeding on Tuesday 10. Spinal stenosis  Shelle Iron, NP [facility] Beeper [phone] 05/25/2014,12:03 PM  LOS: 2 days   Pt seen, examined, agree w assess/plan as above with additions as indicated.  Kelly Splinter MD pager [phone]    cell [phone] 05/25/2014, 1:36 PM       Additional Objective Labs: Basic  Metabolic Panel:  Recent Labs Lab 05/18/14 1333 05/19/14 1514 05/23/14 1156  NA 140 140 136*  K 5.6* 5.7* 5.3  CL 94* 94* 90*  CO2 26 24 22   GLUCOSE 203* 110* 180*  BUN 70* 85* 94*  CREATININE 10.25* 12.55* 12.63*  CALCIUM 9.6 9.6 10.1   Liver Function Tests:  Recent Labs Lab 05/23/14 1156  AST 12  ALT <5  ALKPHOS 66  BILITOT 0.2*  PROT 7.7  ALBUMIN 2.9*   No results found for this basename: LIPASE, AMYLASE,  in the last 168 hours CBC:  Recent Labs Lab 05/18/14 1333 05/19/14 1514 05/23/14 1156  WBC 7.4 12.9* 16.1*  NEUTROABS 4.1  --   --   HGB 10.8* 10.8* 9.0*  HCT 32.2* 32.7* 27.3*  MCV 99.1 101.6* 97.8  PLT 285 273 309   Blood Culture    Component Value Date/Time   SDES BLOOD RIGHT HAND 05/23/2014 1415   SPECREQUEST BOTTLES DRAWN AEROBIC AND ANAEROBIC 5CC 05/23/2014 1415   CULT  Value: STAPHYLOCOCCUS AUREUS Note: Gram Stain Report Called to,Read Back By and Verified With: JULIA OSBORNE ON 05/24/2014 AT 8:52P BY WILEJ Performed at [facility] 05/23/2014 1415   REPTSTATUS PENDING 05/23/2014 1415    Cardiac Enzymes: No results found for this basename: CKTOTAL, CKMB, CKMBINDEX, TROPONINI,  in the last 168 hours CBG: No results found  for this basename: GLUCAP,  in the last 168 hours [place] Studies: No results found for this basename: IRON, TIBC, TRANSFERRIN, FERRITIN,  in the last 72 hours @lablastinr3 @ Studies/Results: Dg Chest 2 View  05/23/2014   CLINICAL DATA:  Weakness  EXAM: CHEST  2 VIEW  COMPARISON:  01/15/2014  FINDINGS: Cardiac shadow is mildly enlarged. The lungs are hypoinflated. Right basilar atelectasis is noted. Additionally some right mid lung infiltrate is seen. No acute bony abnormality is noted.  IMPRESSION: Right basilar atelectasis and right mid lung infiltrate.   Electronically Signed   By: Inez Catalina M.D.   On: 05/23/2014 12:26   Medications:   . aspirin EC  325 mg Oral Daily  . atorvastatin  20 mg Oral QHS  . carbidopa-levodopa  0.5  tablet Oral TID  . ceFEPime (MAXIPIME) IV  2 g Intravenous Q T,Th,Sat-1800  . darbepoetin (ARANESP) injection - DIALYSIS  60 mcg Intravenous Q Thu-HD  . divalproex  1,500 mg Oral Daily  . enoxaparin (LOVENOX) injection  30 mg Subcutaneous Q24H  . escitalopram  5 mg Oral Daily  . Influenza vac split quadrivalent PF  0.5 mL Intramuscular Tomorrow-1000  . metoprolol succinate  12.5 mg Oral QHS  . multivitamin  1 tablet Oral QHS  . sevelamer carbonate  3,200 mg Oral TID WC  . sucroferric oxyhydroxide  500 mg Oral Q supper  . vancomycin  1,000 mg Intravenous Q T,Th,Sa-HD

## 2014-05-25 NOTE — Progress Notes (Addendum)
[facility] PROGRESS NOTE  [name] [name] SNK:[id number] DOB: 1941-04-27 DOA: 05/23/2014 PCP: Donetta Potts, MD  Assessment/Plan: GPC bacteremia likely due to HCAP/PNA - R infiltrate on xray - has LUE AVF for HD -continue Cefepime and Vanc per pharmacy - repeat Blood CX x2, check ECHO - if turns out to be MRSA will consult ID  UTI - continue Cefepime -Urine Cx with GNR >937T  Metabolic encephalopathy -due to 1,2, gabapentin, narcotics -Gabapentin and percocet held -improving  Spinal stenosis and cervical spondylosis -FU with Dr.Elsner -Narcotics on hold due to mentation'  Seizure d/o -continue Depakote, level slightly low  Tremors -continue carb/levo   ESRD  -HD per Renal  Hypertension/volume  -stable, continue TOprol  Anemia of chronic disease -Aranesp with HD   AVF bleeding yesterday -per Renal/VVS  DVt proph: lovenox  Code Status: Full Code Family Communication: no family at bedside Disposition Plan: SNF when stable   Consultants:  Renal  Antibiotics:  Cefepime/Vanc  HPI/Subjective: No complaints, more awake and lucid today  Objective: Filed Vitals:   05/25/14 0942  BP: 134/57  Pulse: 79  Temp: 99 F (37.2 C)  Resp: 19    Intake/Output Summary (Last 24 hours) at 05/25/14 1105 Last data filed at 05/25/14 0900  Gross per 24 hour  Intake    480 ml  Output      0 ml  Net    480 ml   Filed Weights   05/23/14 1300 05/23/14 1845 05/23/14 2315  Weight: 118 kg (260 lb 2.3 oz) 123 kg (271 lb 2.7 oz) 119.8 kg (264 lb 1.8 oz)    Exam:   General:  Awake, more alert, oriented to self and place, less confused  Cardiovascular: S1S2/RRR  Respiratory: poor air movt  Abdomen: soft, Nt, BS present  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, L BKA  NEuro: moves all ext, no localizing signs   Data Reviewed: Basic Metabolic Panel:  Recent Labs Lab 05/18/14 1333 05/19/14 1514 05/23/14 1156  NA 140 140 136*  K 5.6* 5.7* 5.3  CL 94* 94*  90*  CO2 26 24 22   GLUCOSE 203* 110* 180*  BUN 70* 85* 94*  CREATININE 10.25* 12.55* 12.63*  CALCIUM 9.6 9.6 10.1   Liver Function Tests:  Recent Labs Lab 05/23/14 1156  AST 12  ALT <5  ALKPHOS 66  BILITOT 0.2*  PROT 7.7  ALBUMIN 2.9*   No results found for this basename: LIPASE, AMYLASE,  in the last 168 hours No results found for this basename: AMMONIA,  in the last 168 hours CBC:  Recent Labs Lab 05/18/14 1333 05/19/14 1514 05/23/14 1156  WBC 7.4 12.9* 16.1*  NEUTROABS 4.1  --   --   HGB 10.8* 10.8* 9.0*  HCT 32.2* 32.7* 27.3*  MCV 99.1 101.6* 97.8  PLT 285 273 309   Cardiac Enzymes: No results found for this basename: CKTOTAL, CKMB, CKMBINDEX, TROPONINI,  in the last 168 hours BNP (last 3 results)  Recent Labs  05/26/13 1700 01/15/14 1316  PROBNP 699.3* 1399.0*   CBG: No results found for this basename: GLUCAP,  in the last 168 hours  Recent Results (from the past 240 hour(s))  URINE CULTURE     Status: None   Collection Time    05/23/14 11:50 AM      Result Value Ref Range Status   Specimen Description URINE, RANDOM   Final   Special Requests NONE   Final   Culture  Setup Time     Final  Value: 05/23/2014 17:20     Performed at [place]     Final   Value: >=100,000 COLONIES/ML     Performed at [facility]   Culture     Final   Value: ESCHERICHIA COLI     Performed at [facility]   Report Status PENDING   Incomplete  CULTURE, BLOOD (ROUTINE X 2)     Status: None   Collection Time    05/23/14  1:17 PM      Result Value Ref Range Status   Specimen Description BLOOD HAND RIGHT   Final   Special Requests BOTTLES DRAWN AEROBIC ONLY 3CC   Final   Culture  Setup Time     Final   Value: 05/23/2014 16:42     Performed at [facility]   Culture     Final   Value: GRAM POSITIVE COCCI IN CLUSTERS     Note: Gram Stain Report Called to,Read Back By and Verified With: BONITA@12 :50PM ON 05/24/14 BY  DANTS     Performed at [facility]   Report Status PENDING   Incomplete  CULTURE, BLOOD (ROUTINE X 2)     Status: None   Collection Time    05/23/14  2:15 PM      Result Value Ref Range Status   Specimen Description BLOOD RIGHT HAND   Final   Special Requests BOTTLES DRAWN AEROBIC AND ANAEROBIC 5CC   Final   Culture  Setup Time     Final   Value: 05/23/2014 18:52     Performed at [facility]   Culture     Final   Value: GRAM POSITIVE COCCI IN CLUSTERS     Note: Gram Stain Report Called to,Read Back By and Verified With: JULIA OSBORNE ON 05/24/2014 AT 8:52P BY WILEJ     Performed at [facility]   Report Status PENDING   Incomplete  MRSA PCR SCREENING     Status: None   Collection Time    05/23/14  3:49 PM      Result Value Ref Range Status   MRSA by PCR NEGATIVE  NEGATIVE Final   Comment:            The GeneXpert MRSA Assay (FDA     approved for NASAL specimens     only), is one component of a     comprehensive MRSA colonization     surveillance program. It is not     intended to diagnose MRSA     infection nor to guide or     monitor treatment for     MRSA infections.     Studies: Dg Chest 2 View  05/23/2014   CLINICAL DATA:  Weakness  EXAM: CHEST  2 VIEW  COMPARISON:  01/15/2014  FINDINGS: Cardiac shadow is mildly enlarged. The lungs are hypoinflated. Right basilar atelectasis is noted. Additionally some right mid lung infiltrate is seen. No acute bony abnormality is noted.  IMPRESSION: Right basilar atelectasis and right mid lung infiltrate.   Electronically Signed   By: Inez Catalina M.D.   On: 05/23/2014 12:26    Scheduled Meds: . aspirin EC  325 mg Oral Daily  . atorvastatin  20 mg Oral QHS  . carbidopa-levodopa  0.5 tablet Oral TID  . ceFEPime (MAXIPIME) IV  2 g Intravenous Q T,Th,Sat-1800  . darbepoetin (ARANESP) injection - DIALYSIS  60 mcg Intravenous Q Thu-HD  . divalproex  1,500  mg Oral Daily  . enoxaparin (LOVENOX) injection  30 mg  Subcutaneous Q24H  . escitalopram  5 mg Oral Daily  . Influenza vac split quadrivalent PF  0.5 mL Intramuscular Tomorrow-1000  . metoprolol succinate  12.5 mg Oral QHS  . multivitamin  1 tablet Oral QHS  . sevelamer carbonate  3,200 mg Oral TID WC  . sucroferric oxyhydroxide  500 mg Oral Q supper  . vancomycin  1,000 mg Intravenous Q T,Th,Sa-HD   Continuous Infusions:  Antibiotics Given (last 72 hours)   Date/Time Action Medication Dose Rate   05/23/14 2214 Given   vancomycin (VANCOCIN) 2,500 mg in sodium chloride 0.9 % 500 mL IVPB 2,500 mg 250 mL/hr   05/23/14 2248 Given   ceFEPIme (MAXIPIME) 2 g in dextrose 5 % 50 mL IVPB 2 g 100 mL/hr      Active Problems:   HCAP (healthcare-associated pneumonia)    Time spent: 11min    Shaylyn Bawa  [facility] Pager [phone]. If 7PM-7AM, please contact night-coverage at [email or web address], password [facility] 05/25/2014, 11:05 AM  LOS: 2 days

## 2014-05-25 NOTE — Plan of Care (Signed)
Problem: Consults Goal: Pneumonia Patient Education See Patient Educatio Module for education specifics.  Outcome: Not Met (add Reason) Unable to educate due to altered mental status  Problem: Phase I Progression Outcomes Goal: Initial discharge plan identified Outcome: Completed/Met Date Met:  05/25/14 SNF placement on discharge

## 2014-05-26 ENCOUNTER — Inpatient Hospital Stay (HOSPITAL_COMMUNITY): Payer: Medicare Other

## 2014-05-26 DIAGNOSIS — I059 Rheumatic mitral valve disease, unspecified: Secondary | ICD-10-CM

## 2014-05-26 DIAGNOSIS — J189 Pneumonia, unspecified organism: Principal | ICD-10-CM

## 2014-05-26 LAB — CBC
HCT: 27.7 % — ABNORMAL LOW (ref 39.0–52.0)
Hemoglobin: 9.2 g/dL — ABNORMAL LOW (ref 13.0–17.0)
MCH: 33 pg (ref 26.0–34.0)
MCHC: 33.2 g/dL (ref 30.0–36.0)
MCV: 99.3 fL (ref 78.0–100.0)
PLATELETS: 331 10*3/uL (ref 150–400)
RBC: 2.79 MIL/uL — ABNORMAL LOW (ref 4.22–5.81)
RDW: 14.3 % (ref 11.5–15.5)
WBC: 12.3 10*3/uL — ABNORMAL HIGH (ref 4.0–10.5)

## 2014-05-26 LAB — CULTURE, BLOOD (ROUTINE X 2)

## 2014-05-26 LAB — BASIC METABOLIC PANEL
Anion gap: 19 — ABNORMAL HIGH (ref 5–15)
BUN: 48 mg/dL — ABNORMAL HIGH (ref 6–23)
CALCIUM: 10 mg/dL (ref 8.4–10.5)
CO2: 25 mEq/L (ref 19–32)
Chloride: 91 mEq/L — ABNORMAL LOW (ref 96–112)
Creatinine, Ser: 7.03 mg/dL — ABNORMAL HIGH (ref 0.50–1.35)
GFR calc Af Amer: 8 mL/min — ABNORMAL LOW (ref >90)
GFR, EST NON AFRICAN AMERICAN: 7 mL/min — AB (ref >90)
Glucose, Bld: 166 mg/dL — ABNORMAL HIGH (ref 70–99)
Potassium: 4.8 mEq/L (ref 3.7–5.3)
SODIUM: 135 meq/L — AB (ref 137–147)

## 2014-05-26 LAB — URINE CULTURE: Colony Count: >=100000

## 2014-05-26 MED ORDER — HYDROCORTISONE 2.5 % RE CREA
TOPICAL_CREAM | Freq: Two times a day (BID) | RECTAL | Status: DC
  Administered 2014-05-26 – 2014-05-27 (×3): via RECTAL
  Administered 2014-05-28: 1 via RECTAL
  Administered 2014-05-29 – 2014-05-30 (×3): via RECTAL
  Administered 2014-05-31: 1 via RECTAL
  Filled 2014-05-26 (×2): qty 28.35

## 2014-05-26 MED ORDER — CEFAZOLIN SODIUM-DEXTROSE 2-3 GM-% IV SOLR
2.0000 g | INTRAVENOUS | Status: AC
  Administered 2014-05-27: 2 g via INTRAVENOUS
  Filled 2014-05-26: qty 50

## 2014-05-26 NOTE — Progress Notes (Addendum)
[facility] PROGRESS NOTE  [name] [name] QGB:[id number] DOB: 09-Mar-1941 DOA: 05/23/2014 PCP: Donetta Potts, MD  Assessment/Plan: MSSA bacteremia likely due to HCAP/PNA - R infiltrate on xray - has LUE AVF for HD - continue Vanc per pharmacy and Cefepime for UTI - repeat Blood CX from 9/10 pending - i called Cards for TEE as recommended by ID, scheduled for Monday  Ecoli UTI - pan sensitive, > 100K colonies - currently on Cefepime, could change to Po Cipro, will await ID input  Metabolic encephalopathy -due to 1,2, gabapentin, narcotics -Gabapentin and percocet held -improving  Spinal stenosis and cervical spondylosis -FU with Dr.Elsner -Narcotics on hold due to mentation'  Seizure d/o -continue Depakote, level slightly low  Tremors -continue carb/levo   ESRD  -HD per Renal  Hypertension/volume  -stable, continue TOprol  Anemia of chronic disease -Aranesp with HD   AVF bleeding yesterday -per Renal/VVS  DVt proph: lovenox  Code Status: Full Code Family Communication: no family at bedside, attempted to reach wife [name] x2 @ [phone] Disposition Plan: SNF when stable   Consultants:  Renal  Antibiotics:  Cefepime/Vanc  HPI/Subjective: No complaints, reports having a rough night yesterday but unable to elaborate  Objective: Filed Vitals:   05/26/14 1000  BP: 118/67  Pulse: 82  Temp: 98.4 F (36.9 C)  Resp: 18    Intake/Output Summary (Last 24 hours) at 05/26/14 1103 Last data filed at 05/26/14 0900  Gross per 24 hour  Intake    680 ml  Output   2100 ml  Net  -1420 ml   Filed Weights   05/23/14 2315 05/25/14 1426 05/25/14 1927  Weight: 119.8 kg (264 lb 1.8 oz) 121.6 kg (268 lb 1.3 oz) 119.5 kg (263 lb 7.2 oz)    Exam:   General:  Awake, more alert, oriented to self and place, less confused  Cardiovascular: S1S2/RRR  Respiratory: poor air movt  Abdomen: soft, Nt, BS present  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, L  BKA  NEuro: moves all ext, no localizing signs   Data Reviewed: Basic Metabolic Panel:  Recent Labs Lab 05/19/14 1514 05/23/14 1156 05/25/14 1249  NA 140 136* 138  K 5.7* 5.3 5.3  CL 94* 90* 92*  CO2 24 22 24   GLUCOSE 110* 180* 199*  BUN 85* 94* 79*  CREATININE 12.55* 12.63* 10.50*  CALCIUM 9.6 10.1 10.1   Liver Function Tests:  Recent Labs Lab 05/23/14 1156  AST 12  ALT <5  ALKPHOS 66  BILITOT 0.2*  PROT 7.7  ALBUMIN 2.9*   No results found for this basename: LIPASE, AMYLASE,  in the last 168 hours No results found for this basename: AMMONIA,  in the last 168 hours CBC:  Recent Labs Lab 05/19/14 1514 05/23/14 1156 05/25/14 1249  WBC 12.9* 16.1* 17.7*  HGB 10.8* 9.0* 10.0*  HCT 32.7* 27.3* 30.0*  MCV 101.6* 97.8 100.3*  PLT 273 309 201   Cardiac Enzymes: No results found for this basename: CKTOTAL, CKMB, CKMBINDEX, TROPONINI,  in the last 168 hours BNP (last 3 results)  Recent Labs  05/26/13 1700 01/15/14 1316  PROBNP 699.3* 1399.0*   CBG: No results found for this basename: GLUCAP,  in the last 168 hours  Recent Results (from the past 240 hour(s))  URINE CULTURE     Status: None   Collection Time    05/23/14 11:50 AM      Result Value Ref Range Status   Specimen Description URINE, RANDOM   Final   Special Requests  NONE   Final   Culture  Setup Time     Final   Value: 05/23/2014 17:20     Performed at [place]     Final   Value: >=100,000 COLONIES/ML     Performed at [facility]   Culture     Final   Value: ESCHERICHIA COLI     Performed at [facility]   Report Status 05/26/2014 FINAL   Final   Organism ID, Bacteria ESCHERICHIA COLI   Final  CULTURE, BLOOD (ROUTINE X 2)     Status: None   Collection Time    05/23/14  1:17 PM      Result Value Ref Range Status   Specimen Description BLOOD HAND RIGHT   Final   Special Requests BOTTLES DRAWN AEROBIC ONLY 3CC   Final   Culture  Setup Time      Final   Value: 05/23/2014 16:42     Performed at [facility]   Culture     Final   Value: STAPHYLOCOCCUS AUREUS     Note: RIFAMPIN AND GENTAMICIN SHOULD NOT BE USED AS SINGLE DRUGS FOR TREATMENT OF STAPH INFECTIONS.     Note: Gram Stain Report Called to,Read Back By and Verified With: BONITA@12 :50PM ON 05/24/14 BY DANTS     Performed at [facility]   Report Status 05/26/2014 FINAL   Final   Organism ID, Bacteria STAPHYLOCOCCUS AUREUS   Final  CULTURE, BLOOD (ROUTINE X 2)     Status: None   Collection Time    05/23/14  2:15 PM      Result Value Ref Range Status   Specimen Description BLOOD RIGHT HAND   Final   Special Requests BOTTLES DRAWN AEROBIC AND ANAEROBIC 5CC   Final   Culture  Setup Time     Final   Value: 05/23/2014 18:52     Performed at [facility]   Culture     Final   Value: STAPHYLOCOCCUS AUREUS     Note: SUSCEPTIBILITIES PERFORMED ON PREVIOUS CULTURE WITHIN THE LAST 5 DAYS.     Note: Gram Stain Report Called to,Read Back By and Verified With: JULIA OSBORNE ON 05/24/2014 AT 8:52P BY WILEJ     Performed at [facility]   Report Status 05/26/2014 FINAL   Final  MRSA PCR SCREENING     Status: None   Collection Time    05/23/14  3:49 PM      Result Value Ref Range Status   MRSA by PCR NEGATIVE  NEGATIVE Final   Comment:            The GeneXpert MRSA Assay (FDA     approved for NASAL specimens     only), is one component of a     comprehensive MRSA colonization     surveillance program. It is not     intended to diagnose MRSA     infection nor to guide or     monitor treatment for     MRSA infections.  CULTURE, BLOOD (ROUTINE X 2)     Status: None   Collection Time    05/25/14  3:05 PM      Result Value Ref Range Status   Specimen Description BLOOD HEMODIALYSIS GRAFT AVF   Final   Special Requests BOTTLES DRAWN AEROBIC AND ANAEROBIC 10CC   Final   Culture  Setup Time     Final   Value: 05/25/2014  19:18     Performed at [facility]     Final   Value:        BLOOD CULTURE RECEIVED NO GROWTH TO DATE CULTURE WILL BE HELD FOR 5 DAYS BEFORE ISSUING A FINAL NEGATIVE REPORT     Performed at [facility]   Report Status PENDING   Incomplete  CULTURE, BLOOD (ROUTINE X 2)     Status: None   Collection Time    05/25/14  3:15 PM      Result Value Ref Range Status   Specimen Description HEMODIALYSIS GRAFT   Final   Special Requests BOTTLES DRAWN AEROBIC AND ANAEROBIC 10CC AVF   Final   Culture  Setup Time     Final   Value: 05/25/2014 19:22     Performed at [facility]   Culture     Final   Value:        BLOOD CULTURE RECEIVED NO GROWTH TO DATE CULTURE WILL BE HELD FOR 5 DAYS BEFORE ISSUING A FINAL NEGATIVE REPORT     Performed at [facility]   Report Status PENDING   Incomplete     Studies: No results found.  Scheduled Meds: . aspirin EC  325 mg Oral Daily  . atorvastatin  20 mg Oral QHS  . carbidopa-levodopa  0.5 tablet Oral TID  . ceFEPime (MAXIPIME) IV  2 g Intravenous Q T,Th,Sat-1800  . darbepoetin (ARANESP) injection - DIALYSIS  60 mcg Intravenous Q Thu-HD  . divalproex  1,500 mg Oral Daily  . enoxaparin (LOVENOX) injection  30 mg Subcutaneous Q24H  . escitalopram  5 mg Oral Daily  . feeding supplement (NEPRO CARB STEADY)  237 mL Oral TID WC  . Influenza vac split quadrivalent PF  0.5 mL Intramuscular Tomorrow-1000  . metoprolol succinate  12.5 mg Oral QHS  . multivitamin  1 tablet Oral QHS  . sevelamer carbonate  3,200 mg Oral TID WC  . sucroferric oxyhydroxide  500 mg Oral Q supper  . vancomycin  1,000 mg Intravenous Q T,Th,Sa-HD   Continuous Infusions:  Antibiotics Given (last 72 hours)   Date/Time Action Medication Dose Rate   05/23/14 2214 Given   vancomycin (VANCOCIN) 2,500 mg in sodium chloride 0.9 % 500 mL IVPB 2,500 mg 250 mL/hr   05/23/14 2248 Given   ceFEPIme (MAXIPIME) 2 g in dextrose 5 % 50 mL IVPB 2 g 100 mL/hr   05/25/14 1754 Given  [given for  HD]   vancomycin (VANCOCIN) IVPB 1000 mg/200 mL premix 1,000 mg 200 mL/hr      Active Problems:   HCAP (healthcare-associated pneumonia)    Time spent: 60min    [name] [name]  [facility] Pager [phone]. If 7PM-7AM, please contact night-coverage at [email or web address], password [facility] 05/26/2014, 11:03 AM  LOS: 3 days

## 2014-05-26 NOTE — Progress Notes (Addendum)
Subjective:   Is 'disoriented'   Objective Filed Vitals:   05/25/14 1900 05/25/14 1927 05/25/14 2013 05/26/14 0600  BP: 123/70 131/75 145/73 104/72  Pulse: 83 86 88 86  Temp:  99.1 F (37.3 C) 99.3 F (37.4 C) 99.8 F (37.7 C)  TempSrc:  Oral Oral Oral  Resp:  20 20 20   Height:      Weight:  119.5 kg (263 lb 7.2 oz)    SpO2:  95% 100% 100%   Physical Exam General: alert, no acute distress. Oriented to person and place. Does not remember me but thinks he should Heart: RRR, no murmur Lungs: CTA, unlabored Abdomen: soft, non tender +BS Extremities: L BKA. R no edema Dialysis Access: L AVF +b/t, no drainage, erythema or abcess  [facility]: [facility]  4 hr 30 mins 118kg 2K/2Ca+ 350/8000 6000 Heparin L AVF  hectorol 4 mcg Aranesp 50 q week No Fe  Assessment/Plan:  1. AMS- due to infection, is a little better than on admission 2. PNA- R infiltrate on xray. Cefepime and Vanc per pharm. Blood cultures- staph aureus. Afebrile. Sputum culture pending. 2D echo pending. ID following.  3. UTI- culture - Ecoli. antibx per pharm. Has had AMS in the past (may 2015) secondary to UTI, +Ecoli and was treated with Rocephin 4. MSSA bacteremia- ID evaluating, he did have a stitch abcess on the AVF the day of admission which drained a small amt of pus, there is no sign of active infection now, have d/w VVS 5. Tremor- Head CT- no acute event on 9/4. Follows with neurology, last appt 8/31- continue divalproex for seizure and carb/levo for tremor  6. ESRD - TTS @ [place]. HD tomorrow. K+5.3 7. Hypertension/volume - 118/67, under edw last tx, lower. Metop. Just over dry wt, minimum UF w HD Sat 8. Anemia -hgb 10.  ESA q Thursday, missed last weeks dose- given 9/8. No Fe, last tsat 28. 9. Metabolic bone disease - OF+75.1/02 corrected. Hold hectorol. 2 Ca+ bath. - last phos 8.9 (8/27) PTH 391 (7/23) Cont binders - renvela and velphoro 10. Nutrition - alb 2.9. Multi vit renal diet.  11. AVF bleeding-  VVS following, bleeding on Tuesday 12. Spinal stenosis   Shelle Iron, NP [place] [phone] 05/26/2014,10:17 AM  LOS: 3 days   Pt seen, examined, agree w assess/plan as above with additions as indicated.  Kelly Splinter MD pager [phone]    cell [phone] 05/26/2014, 12:22 PM      Additional Objective Labs: Basic Metabolic Panel:  Recent Labs Lab 05/19/14 1514 05/23/14 1156 05/25/14 1249  NA 140 136* 138  K 5.7* 5.3 5.3  CL 94* 90* 92*  CO2 24 22 24   GLUCOSE 110* 180* 199*  BUN 85* 94* 79*  CREATININE 12.55* 12.63* 10.50*  CALCIUM 9.6 10.1 10.1   Liver Function Tests:  Recent Labs Lab 05/23/14 1156  AST 12  ALT <5  ALKPHOS 66  BILITOT 0.2*  PROT 7.7  ALBUMIN 2.9*   No results found for this basename: LIPASE, AMYLASE,  in the last 168 hours CBC:  Recent Labs Lab 05/19/14 1514 05/23/14 1156 05/25/14 1249  WBC 12.9* 16.1* 17.7*  HGB 10.8* 9.0* 10.0*  HCT 32.7* 27.3* 30.0*  MCV 101.6* 97.8 100.3*  PLT 273 309 201   Blood Culture    Component Value Date/Time   SDES HEMODIALYSIS GRAFT 05/25/2014 1515   SPECREQUEST BOTTLES DRAWN AEROBIC AND ANAEROBIC 10CC AVF 05/25/2014 1515   CULT  Value:  BLOOD CULTURE RECEIVED NO GROWTH TO DATE CULTURE WILL BE HELD FOR 5 DAYS BEFORE ISSUING A FINAL NEGATIVE REPORT Performed at [facility] 05/25/2014 1515   REPTSTATUS PENDING 05/25/2014 1515    Cardiac Enzymes: No results found for this basename: CKTOTAL, CKMB, CKMBINDEX, TROPONINI,  in the last 168 hours CBG: No results found for this basename: GLUCAP,  in the last 168 hours [place] Studies: No results found for this basename: IRON, TIBC, TRANSFERRIN, FERRITIN,  in the last 72 hours @lablastinr3 @ Studies/Results: No results found. Medications:   . aspirin EC  325 mg Oral Daily  . atorvastatin  20 mg Oral QHS  . carbidopa-levodopa  0.5 tablet Oral TID  . ceFEPime (MAXIPIME) IV  2 g Intravenous Q T,Th,Sat-1800  .  darbepoetin (ARANESP) injection - DIALYSIS  60 mcg Intravenous Q Thu-HD  . divalproex  1,500 mg Oral Daily  . enoxaparin (LOVENOX) injection  30 mg Subcutaneous Q24H  . escitalopram  5 mg Oral Daily  . feeding supplement (NEPRO CARB STEADY)  237 mL Oral TID WC  . Influenza vac split quadrivalent PF  0.5 mL Intramuscular Tomorrow-1000  . metoprolol succinate  12.5 mg Oral QHS  . multivitamin  1 tablet Oral QHS  . sevelamer carbonate  3,200 mg Oral TID WC  . sucroferric oxyhydroxide  500 mg Oral Q supper  . vancomycin  1,000 mg Intravenous Q T,Th,Sa-HD

## 2014-05-26 NOTE — Progress Notes (Signed)
Echo Lab  2D Echocardiogram completed.  [place], [place] 05/26/2014 2:04 PM

## 2014-05-26 NOTE — Progress Notes (Signed)
CARE MANAGEMENT NOTE 05/26/2014  Patient:  [name],[name]   Account Number:  [id number]  Date Initiated:  05/25/2014  Documentation initiated by:  [facility]  Subjective/Objective Assessment:   HCAP     Action/Plan:   SNF   Anticipated DC Date:  05/27/2014   Anticipated DC Plan:  [place]  CM consult      Choice offered to / List presented to:             Status of service:  Completed, signed off Medicare Important Message given?  YES (If response is "NO", the following Medicare IM given date fields will be blank) Date Medicare IM given:  05/25/2014 Medicare IM given by:  [facility] Date Additional Medicare IM given:   Additional Medicare IM given by:    Discharge Disposition:  [place]  Per UR Regulation:    If discussed at Long Length of Stay Meetings, dates discussed:    Comments:  05/26/2014 1400 NCM spoke to wife, [name] [name], # [phone]. States the plan is dc back to SNF. [name] Finner RN CCM Case Mgmt phone [phone]

## 2014-05-26 NOTE — Progress Notes (Addendum)
INFECTIOUS DISEASE PROGRESS NOTE  ID: [name] [name] is a 73 y.o. male with  Active Problems:   HCAP (healthcare-associated pneumonia)  Subjective: Resting quietly  Abtx:  Anti-infectives   Start     Dose/Rate Route Frequency Ordered Stop   05/25/14 1800  ceFEPIme (MAXIPIME) 2 g in dextrose 5 % 50 mL IVPB     2 g 100 mL/hr over 30 Minutes Intravenous Every T-Th-Sa (1800) 05/24/14 1023     05/25/14 1200  vancomycin (VANCOCIN) IVPB 1000 mg/200 mL premix  Status:  Discontinued     1,000 mg 200 mL/hr over 60 Minutes Intravenous Every T-Th-Sa (Hemodialysis) 05/23/14 1729 05/23/14 1752   05/25/14 1200  vancomycin (VANCOCIN) IVPB 1000 mg/200 mL premix     1,000 mg 200 mL/hr over 60 Minutes Intravenous Every T-Th-Sa (Hemodialysis) 05/23/14 1854     05/25/14 1200  ceFEPIme (MAXIPIME) 2 g in dextrose 5 % 50 mL IVPB  Status:  Discontinued     2 g 100 mL/hr over 30 Minutes Intravenous Every T-Th-Sa (Hemodialysis) 05/23/14 1855 05/24/14 1023   05/23/14 2100  ceFEPIme (MAXIPIME) 2 g in dextrose 5 % 50 mL IVPB  Status:  Discontinued     2 g 100 mL/hr over 30 Minutes Intravenous Every T-Th-Sa (Hemodialysis) 05/23/14 1751 05/23/14 1855   05/23/14 2100  vancomycin (VANCOCIN) IVPB 1000 mg/200 mL premix  Status:  Discontinued     1,000 mg 200 mL/hr over 60 Minutes Intravenous Every T-Th-Sa (Hemodialysis) 05/23/14 1752 05/23/14 1854   05/23/14 1930  vancomycin (VANCOCIN) IVPB 1000 mg/200 mL premix  Status:  Discontinued     1,000 mg 200 mL/hr over 60 Minutes Intravenous To Hemodialysis 05/23/14 1854 05/23/14 1856   05/23/14 1930  ceFEPIme (MAXIPIME) 2 g in dextrose 5 % 50 mL IVPB     2 g 100 mL/hr over 30 Minutes Intravenous To Hemodialysis 05/23/14 1855 05/23/14 2318   05/23/14 1900  ceFEPIme (MAXIPIME) 2 g in dextrose 5 % 50 mL IVPB  Status:  Discontinued     2 g 100 mL/hr over 30 Minutes Intravenous Every T-Th-Sa (Hemodialysis) 05/23/14 1537 05/23/14 1751   05/23/14 1900  vancomycin  (VANCOCIN) IVPB 1000 mg/200 mL premix  Status:  Discontinued     1,000 mg 200 mL/hr over 60 Minutes Intravenous Every T-Th-Sa (Hemodialysis) 05/23/14 1537 05/23/14 1729   05/23/14 1900  vancomycin (VANCOCIN) 2,500 mg in sodium chloride 0.9 % 500 mL IVPB     2,500 mg 250 mL/hr over 120 Minutes Intravenous To Hemodialysis 05/23/14 1856 05/24/14 0014   05/23/14 1830  vancomycin (VANCOCIN) 2,500 mg in sodium chloride 0.9 % 500 mL IVPB  Status:  Discontinued     2,500 mg 250 mL/hr over 120 Minutes Intravenous To Hemodialysis 05/23/14 1729 05/23/14 1853   05/23/14 1400  vancomycin (VANCOCIN) 2,500 mg in sodium chloride 0.9 % 500 mL IVPB     2,500 mg 250 mL/hr over 120 Minutes Intravenous  Once 05/23/14 1309 05/23/14 1530   05/23/14 1300  ceFEPIme (MAXIPIME) 1 g in dextrose 5 % 50 mL IVPB  Status:  Discontinued     1 g 100 mL/hr over 30 Minutes Intravenous  Once 05/23/14 1257 05/23/14 1535      Medications:  Scheduled: . aspirin EC  325 mg Oral Daily  . atorvastatin  20 mg Oral QHS  . carbidopa-levodopa  0.5 tablet Oral TID  . ceFEPime (MAXIPIME) IV  2 g Intravenous Q T,Th,Sat-1800  . darbepoetin (ARANESP) injection - DIALYSIS  60 mcg  Intravenous Q Thu-HD  . divalproex  1,500 mg Oral Daily  . enoxaparin (LOVENOX) injection  30 mg Subcutaneous Q24H  . escitalopram  5 mg Oral Daily  . feeding supplement (NEPRO CARB STEADY)  237 mL Oral TID WC  . hydrocortisone   Rectal BID  . Influenza vac split quadrivalent PF  0.5 mL Intramuscular Tomorrow-1000  . metoprolol succinate  12.5 mg Oral QHS  . multivitamin  1 tablet Oral QHS  . sevelamer carbonate  3,200 mg Oral TID WC  . sucroferric oxyhydroxide  500 mg Oral Q supper  . vancomycin  1,000 mg Intravenous Q T,Th,Sa-HD    Objective: Vital signs in last 24 hours: Temp:  [98.4 F (36.9 C)-99.8 F (37.7 C)] 98.4 F (36.9 C) (09/11 1000) Pulse Rate:  [71-103] 82 (09/11 1000) Resp:  [18-20] 18 (09/11 1000) BP: (99-145)/(50-79) 118/67 mmHg  (09/11 1000) SpO2:  [94 %-100 %] 94 % (09/11 1000) Weight:  [119.5 kg (263 lb 7.2 oz)-121.6 kg (268 lb 1.3 oz)] 119.5 kg (263 lb 7.2 oz) (09/10 1927)   General appearance: fatigued and no distress Resp: clear to auscultation bilaterally Cardio: regular rate and rhythm GI: normal findings: bowel sounds normal and soft, non-tender  Lab Results  Recent Labs  05/25/14 1249 05/26/14 1239  WBC 17.7* 12.3*  HGB 10.0* 9.2*  HCT 30.0* 27.7*  NA 138 135*  K 5.3 4.8  CL 92* 91*  CO2 24 25  BUN 79* 48*  CREATININE 10.50* 7.03*   Liver Panel No results found for this basename: PROT, ALBUMIN, AST, ALT, ALKPHOS, BILITOT, BILIDIR, IBILI,  in the last 72 hours Sedimentation Rate No results found for this basename: ESRSEDRATE,  in the last 72 hours C-Reactive Protein No results found for this basename: CRP,  in the last 72 hours  Microbiology: Recent Results (from the past 240 hour(s))  URINE CULTURE     Status: None   Collection Time    05/23/14 11:50 AM      Result Value Ref Range Status   Specimen Description URINE, RANDOM   Final   Special Requests NONE   Final   Culture  Setup Time     Final   Value: 05/23/2014 17:20     Performed at [place]     Final   Value: >=100,000 COLONIES/ML     Performed at [facility]   Culture     Final   Value: ESCHERICHIA COLI     Performed at [facility]   Report Status 05/26/2014 FINAL   Final   Organism ID, Bacteria ESCHERICHIA COLI   Final  CULTURE, BLOOD (ROUTINE X 2)     Status: None   Collection Time    05/23/14  1:17 PM      Result Value Ref Range Status   Specimen Description BLOOD HAND RIGHT   Final   Special Requests BOTTLES DRAWN AEROBIC ONLY 3CC   Final   Culture  Setup Time     Final   Value: 05/23/2014 16:42     Performed at [facility]   Culture     Final   Value: STAPHYLOCOCCUS AUREUS     Note: RIFAMPIN AND GENTAMICIN SHOULD NOT BE USED AS SINGLE DRUGS FOR TREATMENT  OF STAPH INFECTIONS.     Note: Gram Stain Report Called to,Read Back By and Verified With: BONITA@12 :50PM ON 05/24/14 BY DANTS     Performed at [facility]   Report  Status 05/26/2014 FINAL   Final   Organism ID, Bacteria STAPHYLOCOCCUS AUREUS   Final  CULTURE, BLOOD (ROUTINE X 2)     Status: None   Collection Time    05/23/14  2:15 PM      Result Value Ref Range Status   Specimen Description BLOOD RIGHT HAND   Final   Special Requests BOTTLES DRAWN AEROBIC AND ANAEROBIC 5CC   Final   Culture  Setup Time     Final   Value: 05/23/2014 18:52     Performed at [facility]   Culture     Final   Value: STAPHYLOCOCCUS AUREUS     Note: SUSCEPTIBILITIES PERFORMED ON PREVIOUS CULTURE WITHIN THE LAST 5 DAYS.     Note: Gram Stain Report Called to,Read Back By and Verified With: JULIA OSBORNE ON 05/24/2014 AT 8:52P BY WILEJ     Performed at [facility]   Report Status 05/26/2014 FINAL   Final  MRSA PCR SCREENING     Status: None   Collection Time    05/23/14  3:49 PM      Result Value Ref Range Status   MRSA by PCR NEGATIVE  NEGATIVE Final   Comment:            The GeneXpert MRSA Assay (FDA     approved for NASAL specimens     only), is one component of a     comprehensive MRSA colonization     surveillance program. It is not     intended to diagnose MRSA     infection nor to guide or     monitor treatment for     MRSA infections.  CULTURE, BLOOD (ROUTINE X 2)     Status: None   Collection Time    05/25/14  3:05 PM      Result Value Ref Range Status   Specimen Description BLOOD HEMODIALYSIS GRAFT AVF   Final   Special Requests BOTTLES DRAWN AEROBIC AND ANAEROBIC 10CC   Final   Culture  Setup Time     Final   Value: 05/25/2014 19:18     Performed at [facility]   Culture     Final   Value:        BLOOD CULTURE RECEIVED NO GROWTH TO DATE CULTURE WILL BE HELD FOR 5 DAYS BEFORE ISSUING A FINAL NEGATIVE REPORT     Performed at [facility]   Report  Status PENDING   Incomplete  CULTURE, BLOOD (ROUTINE X 2)     Status: None   Collection Time    05/25/14  3:15 PM      Result Value Ref Range Status   Specimen Description HEMODIALYSIS GRAFT   Final   Special Requests BOTTLES DRAWN AEROBIC AND ANAEROBIC 10CC AVF   Final   Culture  Setup Time     Final   Value: 05/25/2014 19:22     Performed at [facility]   Culture     Final   Value:        BLOOD CULTURE RECEIVED NO GROWTH TO DATE CULTURE WILL BE HELD FOR 5 DAYS BEFORE ISSUING A FINAL NEGATIVE REPORT     Performed at [facility]   Report Status PENDING   Incomplete    Studies/Results: No results found.   Assessment/Plan: Bacteremia/MSSA E coli (pan-sens) in UCx  ESRD  Spinal Pain- hx of cervical spondylosis  R hip pain  Total days of antibiotics: 4 (vanco/cefepime) Will  change anbx to ancef Needs MRI of hip Needs TEE VVS aware of recent stitch abscess on vasc graft.  Await repeat BCx Dr Megan Salon available if questions over weekend.          [name] Infectious Diseases (pager) [phone] [email or web address] 05/26/2014, 2:23 PM  LOS: 3 days

## 2014-05-26 NOTE — Progress Notes (Signed)
abcess on L-AVF started bleeding moderate amount of red blood while pt being fed,pressure applied for 20 minutes bleeding stopped,and reinforced with dressing,DR Clover Mealy aware will continue to monitor ,Rockie Neighbours RN

## 2014-05-26 NOTE — Progress Notes (Signed)
ANTIBIOTIC CONSULT NOTE - INITIAL  Pharmacy Consult for ancef Indication: MSSA bacteremia  No Known Allergies  Patient Measurements: Height: 5' 8.9" (175 cm) Weight: 263 lb 7.2 oz (119.5 kg) IBW/kg (Calculated) : 70.47   Vital Signs: Temp: 98.4 F (36.9 C) (09/11 1000) Temp src: Oral (09/11 1000) BP: 118/67 mmHg (09/11 1000) Pulse Rate: 82 (09/11 1000) Intake/Output from previous day: 09/10 0701 - 09/11 0700 In: 680 [P.O.:480; IV Piggyback:200] Out: 2100  Intake/Output from this shift: Total I/O In: 180 [P.O.:180] Out: -   Labs:  Recent Labs  05/25/14 1249 05/26/14 1239  WBC 17.7* 12.3*  HGB 10.0* 9.2*  PLT 201 331  CREATININE 10.50* 7.03*   Estimated Creatinine Clearance: 11.7 ml/min (by C-G formula based on Cr of 7.03). No results found for this basename: VANCOTROUGH, Corlis Leak, VANCORANDOM, [place], GENTPEAK, GENTRANDOM, TOBRATROUGH, TOBRAPEAK, TOBRARND, AMIKACINPEAK, AMIKACINTROU, AMIKACIN,  in the last 72 hours   Microbiology: Recent Results (from the past 720 hour(s))  URINE CULTURE     Status: None   Collection Time    05/23/14 11:50 AM      Result Value Ref Range Status   Specimen Description URINE, RANDOM   Final   Special Requests NONE   Final   Culture  Setup Time     Final   Value: 05/23/2014 17:20     Performed at [place]     Final   Value: >=100,000 COLONIES/ML     Performed at [facility]   Culture     Final   Value: ESCHERICHIA COLI     Performed at [facility]   Report Status 05/26/2014 FINAL   Final   Organism ID, Bacteria ESCHERICHIA COLI   Final  CULTURE, BLOOD (ROUTINE X 2)     Status: None   Collection Time    05/23/14  1:17 PM      Result Value Ref Range Status   Specimen Description BLOOD HAND RIGHT   Final   Special Requests BOTTLES DRAWN AEROBIC ONLY 3CC   Final   Culture  Setup Time     Final   Value: 05/23/2014 16:42     Performed at [facility]   Culture      Final   Value: STAPHYLOCOCCUS AUREUS     Note: RIFAMPIN AND GENTAMICIN SHOULD NOT BE USED AS SINGLE DRUGS FOR TREATMENT OF STAPH INFECTIONS.     Note: Gram Stain Report Called to,Read Back By and Verified With: BONITA@12 :50PM ON 05/24/14 BY DANTS     Performed at [facility]   Report Status 05/26/2014 FINAL   Final   Organism ID, Bacteria STAPHYLOCOCCUS AUREUS   Final  CULTURE, BLOOD (ROUTINE X 2)     Status: None   Collection Time    05/23/14  2:15 PM      Result Value Ref Range Status   Specimen Description BLOOD RIGHT HAND   Final   Special Requests BOTTLES DRAWN AEROBIC AND ANAEROBIC 5CC   Final   Culture  Setup Time     Final   Value: 05/23/2014 18:52     Performed at [facility]   Culture     Final   Value: STAPHYLOCOCCUS AUREUS     Note: SUSCEPTIBILITIES PERFORMED ON PREVIOUS CULTURE WITHIN THE LAST 5 DAYS.     Note: Gram Stain Report Called to,Read Back By and Verified With: JULIA OSBORNE ON 05/24/2014 AT 8:52P BY [facility]     Performed  at [facility]   Report Status 05/26/2014 FINAL   Final  MRSA PCR SCREENING     Status: None   Collection Time    05/23/14  3:49 PM      Result Value Ref Range Status   MRSA by PCR NEGATIVE  NEGATIVE Final   Comment:            The GeneXpert MRSA Assay (FDA     approved for NASAL specimens     only), is one component of a     comprehensive MRSA colonization     surveillance program. It is not     intended to diagnose MRSA     infection nor to guide or     monitor treatment for     MRSA infections.  CULTURE, BLOOD (ROUTINE X 2)     Status: None   Collection Time    05/25/14  3:05 PM      Result Value Ref Range Status   Specimen Description BLOOD HEMODIALYSIS GRAFT AVF   Final   Special Requests BOTTLES DRAWN AEROBIC AND ANAEROBIC 10CC   Final   Culture  Setup Time     Final   Value: 05/25/2014 19:18     Performed at [facility]   Culture     Final   Value:        BLOOD CULTURE RECEIVED NO GROWTH TO  DATE CULTURE WILL BE HELD FOR 5 DAYS BEFORE ISSUING A FINAL NEGATIVE REPORT     Performed at [facility]   Report Status PENDING   Incomplete  CULTURE, BLOOD (ROUTINE X 2)     Status: None   Collection Time    05/25/14  3:15 PM      Result Value Ref Range Status   Specimen Description HEMODIALYSIS GRAFT   Final   Special Requests BOTTLES DRAWN AEROBIC AND ANAEROBIC 10CC AVF   Final   Culture  Setup Time     Final   Value: 05/25/2014 19:22     Performed at [facility]   Culture     Final   Value:        BLOOD CULTURE RECEIVED NO GROWTH TO DATE CULTURE WILL BE HELD FOR 5 DAYS BEFORE ISSUING A FINAL NEGATIVE REPORT     Performed at [facility]   Report Status PENDING   Incomplete    Medical History: Past Medical History  Diagnosis Date  . ESRD on hemodialysis     Started HD around 2009, maybe longer.  Was living in [place], [place] then.  Moved to [place] in 2014 and gets HD now at [facility] on [address] on a TTS schedule.  ESRD was due to DM and HTN.     Marland Kitchen Peripheral vascular disease   . Hypertension   . Hyperlipidemia   . Arthritis     Gout  . Paroxysmal atrial fibrillation   . Brain tumor 2009  . Seizure disorder 2009  . Anemia   . Hyperparathyroidism, secondary   . Diabetes mellitus type 2 with complications     Type II, diet controlled  . Proteus mirabilis infection   . CHF (congestive heart failure)     grade 1 DD, preserved EF, [facility] records  . 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 brain tumor  . GERD (gastroesophageal reflux disease)   . Hx of cardiac cath     a. LHC (9/14):  Normal cors, EF 65%.    Assessment: Patient is a 73 y.o currently on cefepime and vancomycin since 9/8.  Blood cultures now positive for MSSA and ucx positive or e.coli..  To change abx to ancef.  Vanc 9/8 >> 9/11 Cefepime 9/8 >> 9/11 Ancef 9/11 >>  9/8 BCx x2>>MSSA FINAL 9/8 UCx >> E.coli FINAL 9/8 RCx  >>ngtd 9/10 BCx >>ngtd   Plan:  1) ancef 2gm QHD 2) pharmacy will sign off.  Re-consult [place] if need further assistance.  Gabryella Murfin P 05/26/2014,3:51 PM

## 2014-05-26 NOTE — Progress Notes (Signed)
Note/chart reviewed. Agree with note.   Ihsan Nomura RD, LDN, CNSC [phone] Pager [phone] After Hours Pager   

## 2014-05-27 DIAGNOSIS — G9340 Encephalopathy, unspecified: Secondary | ICD-10-CM

## 2014-05-27 LAB — RENAL FUNCTION PANEL
ANION GAP: 17 — AB (ref 5–15)
Albumin: 2.3 g/dL — ABNORMAL LOW (ref 3.5–5.2)
BUN: 69 mg/dL — AB (ref 6–23)
CHLORIDE: 93 meq/L — AB (ref 96–112)
CO2: 26 mEq/L (ref 19–32)
CREATININE: 8.37 mg/dL — AB (ref 0.50–1.35)
Calcium: 9.6 mg/dL (ref 8.4–10.5)
GFR calc Af Amer: 6 mL/min — ABNORMAL LOW (ref >90)
GFR calc non Af Amer: 6 mL/min — ABNORMAL LOW (ref >90)
GLUCOSE: 155 mg/dL — AB (ref 70–99)
Phosphorus: 6.1 mg/dL — ABNORMAL HIGH (ref 2.3–4.6)
Potassium: 4.4 mEq/L (ref 3.7–5.3)
Sodium: 136 mEq/L — ABNORMAL LOW (ref 137–147)

## 2014-05-27 LAB — CBC
HEMATOCRIT: 25.2 % — AB (ref 39.0–52.0)
HEMOGLOBIN: 8.4 g/dL — AB (ref 13.0–17.0)
MCH: 33.7 pg (ref 26.0–34.0)
MCHC: 33.3 g/dL (ref 30.0–36.0)
MCV: 101.2 fL — AB (ref 78.0–100.0)
Platelets: 279 10*3/uL (ref 150–400)
RBC: 2.49 MIL/uL — ABNORMAL LOW (ref 4.22–5.81)
RDW: 14.3 % (ref 11.5–15.5)
WBC: 11.3 10*3/uL — ABNORMAL HIGH (ref 4.0–10.5)

## 2014-05-27 MED ORDER — HEPARIN SODIUM (PORCINE) 1000 UNIT/ML DIALYSIS: 1000.0000 [IU] | INTRAMUSCULAR | Status: DC | PRN

## 2014-05-27 MED ORDER — SODIUM CHLORIDE 0.9 % IV SOLN: 100.0000 mL | INTRAVENOUS | Status: DC | PRN

## 2014-05-27 MED ORDER — HYDROMORPHONE HCL PF 1 MG/ML IJ SOLN
INTRAMUSCULAR | Status: AC
  Administered 2014-05-27: 1 mg via INTRAVENOUS
  Filled 2014-05-27: qty 1

## 2014-05-27 MED ORDER — PENTAFLUOROPROP-TETRAFLUOROETH EX AERO: 1.0000 "application " | INHALATION_SPRAY | CUTANEOUS | Status: DC | PRN

## 2014-05-27 MED ORDER — ALTEPLASE 2 MG IJ SOLR: 2.0000 mg | Freq: Once | INTRAMUSCULAR | Status: DC | PRN

## 2014-05-27 MED ORDER — HYDROMORPHONE HCL PF 1 MG/ML IJ SOLN
1.0000 mg | INTRAMUSCULAR | Status: DC | PRN
  Administered 2014-05-27: 1 mg via INTRAVENOUS
  Filled 2014-05-27: qty 1

## 2014-05-27 MED ORDER — NEPRO/CARBSTEADY PO LIQD: 237.0000 mL | ORAL | Status: DC | PRN

## 2014-05-27 MED ORDER — HEPARIN SODIUM (PORCINE) 1000 UNIT/ML DIALYSIS: 6000.0000 [IU] | Freq: Once | INTRAMUSCULAR | Status: DC

## 2014-05-27 MED ORDER — LIDOCAINE-PRILOCAINE 2.5-2.5 % EX CREA: 1.0000 "application " | TOPICAL_CREAM | CUTANEOUS | Status: DC | PRN

## 2014-05-27 MED ORDER — LIDOCAINE HCL (PF) 1 % IJ SOLN
5.0000 mL | INTRAMUSCULAR | Status: DC | PRN
Start: 2014-05-27 — End: 2014-05-27

## 2014-05-27 NOTE — Progress Notes (Addendum)
[facility] PROGRESS NOTE  [name] [name] WUJ:[id number] DOB: 1941/08/25 DOA: 05/23/2014 PCP: Donetta Potts, MD  Assessment/Plan: MSSA bacteremia likely due to HCAP/PNA - R infiltrate on xray - has LUE AVF for HD and stitch abscesses on graft, VVS aware - continue Ancef per ID - repeat Blood CX from 9/10 NGTD - MRI R hip per ID recs pending - i called Cards for TEE as recommended by ID, scheduled for Monday  Ecoli UTI - pan sensitive, > 100K colonies - completed 4days of cefepime, now on ancef  Metabolic encephalopathy -due to 1,2, gabapentin, narcotics -Gabapentin and percocet held -improving  Spinal stenosis and cervical spondylosis -FU with Dr.Elsner -Narcotics were on hold due to mentation -will resume low dose due to severe chronic pain  Seizure d/o -continue Depakote, level slightly low  Tremors -continue carb/levo   ESRD  -HD per Renal  Hypertension/volume  -stable, continue TOprol  Anemia of chronic disease -Aranesp with HD   AVF bleeding yesterday -per Renal/VVS  DVt proph: lovenox  Code Status: Full Code Family Communication: no family at bedside, attempted to reach wife [name] x2 @ [phone], finally was able to contact wife 9/11; Disposition Plan: SNF when stable   Consultants:  Renal  Antibiotics:  Cefepime/Vanc 9/8 -9/11  Ancef 9/11  HPI/Subjective: Complains of pain all over  Objective: Filed Vitals:   05/27/14 1245  BP: 110/50  Pulse: 82  Temp: 98 F (36.7 C)  Resp:     Intake/Output Summary (Last 24 hours) at 05/27/14 1345 Last data filed at 05/27/14 1246  Gross per 24 hour  Intake     60 ml  Output    432 ml  Net   -372 ml   Filed Weights   05/25/14 1927 05/26/14 2035 05/27/14 0745  Weight: 119.5 kg (263 lb 7.2 oz) 119 kg (262 lb 5.6 oz) 120.8 kg (266 lb 5.1 oz)    Exam:   General:  Awake, more alert, oriented to self and place, less confused  Cardiovascular: S1S2/RRR  Respiratory: poor air  movt  Abdomen: soft, Nt, BS present  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, L BKA  NEuro: moves all ext, no localizing signs   Data Reviewed: Basic Metabolic Panel:  Recent Labs Lab 05/23/14 1156 05/25/14 1249 05/26/14 1239 05/27/14 0759  NA 136* 138 135* 136*  K 5.3 5.3 4.8 4.4  CL 90* 92* 91* 93*  CO2 22 24 25 26   GLUCOSE 180* 199* 166* 155*  BUN 94* 79* 48* 69*  CREATININE 12.63* 10.50* 7.03* 8.37*  CALCIUM 10.1 10.1 10.0 9.6  PHOS  --   --   --  6.1*   Liver Function Tests:  Recent Labs Lab 05/23/14 1156 05/27/14 0759  AST 12  --   ALT <5  --   ALKPHOS 66  --   BILITOT 0.2*  --   PROT 7.7  --   ALBUMIN 2.9* 2.3*   No results found for this basename: LIPASE, AMYLASE,  in the last 168 hours No results found for this basename: AMMONIA,  in the last 168 hours CBC:  Recent Labs Lab 05/23/14 1156 05/25/14 1249 05/26/14 1239 05/27/14 0500  WBC 16.1* 17.7* 12.3* 11.3*  HGB 9.0* 10.0* 9.2* 8.4*  HCT 27.3* 30.0* 27.7* 25.2*  MCV 97.8 100.3* 99.3 101.2*  PLT 309 201 331 279   Cardiac Enzymes: No results found for this basename: CKTOTAL, CKMB, CKMBINDEX, TROPONINI,  in the last 168 hours BNP (last 3 results)  Recent Labs  01/15/14 1316  PROBNP 1399.0*   CBG: No results found for this basename: GLUCAP,  in the last 168 hours  Recent Results (from the past 240 hour(s))  URINE CULTURE     Status: None   Collection Time    05/23/14 11:50 AM      Result Value Ref Range Status   Specimen Description URINE, RANDOM   Final   Special Requests NONE   Final   Culture  Setup Time     Final   Value: 05/23/2014 17:20     Performed at [place]     Final   Value: >=100,000 COLONIES/ML     Performed at [facility]   Culture     Final   Value: ESCHERICHIA COLI     Performed at [facility]   Report Status 05/26/2014 FINAL   Final   Organism ID, Bacteria ESCHERICHIA COLI   Final  CULTURE, BLOOD (ROUTINE X 2)     Status: None    Collection Time    05/23/14  1:17 PM      Result Value Ref Range Status   Specimen Description BLOOD HAND RIGHT   Final   Special Requests BOTTLES DRAWN AEROBIC ONLY 3CC   Final   Culture  Setup Time     Final   Value: 05/23/2014 16:42     Performed at [facility]   Culture     Final   Value: STAPHYLOCOCCUS AUREUS     Note: RIFAMPIN AND GENTAMICIN SHOULD NOT BE USED AS SINGLE DRUGS FOR TREATMENT OF STAPH INFECTIONS.     Note: Gram Stain Report Called to,Read Back By and Verified With: BONITA@12 :50PM ON 05/24/14 BY DANTS     Performed at [facility]   Report Status 05/26/2014 FINAL   Final   Organism ID, Bacteria STAPHYLOCOCCUS AUREUS   Final  CULTURE, BLOOD (ROUTINE X 2)     Status: None   Collection Time    05/23/14  2:15 PM      Result Value Ref Range Status   Specimen Description BLOOD RIGHT HAND   Final   Special Requests BOTTLES DRAWN AEROBIC AND ANAEROBIC 5CC   Final   Culture  Setup Time     Final   Value: 05/23/2014 18:52     Performed at [facility]   Culture     Final   Value: STAPHYLOCOCCUS AUREUS     Note: SUSCEPTIBILITIES PERFORMED ON PREVIOUS CULTURE WITHIN THE LAST 5 DAYS.     Note: Gram Stain Report Called to,Read Back By and Verified With: JULIA OSBORNE ON 05/24/2014 AT 8:52P BY WILEJ     Performed at [facility]   Report Status 05/26/2014 FINAL   Final  MRSA PCR SCREENING     Status: None   Collection Time    05/23/14  3:49 PM      Result Value Ref Range Status   MRSA by PCR NEGATIVE  NEGATIVE Final   Comment:            The GeneXpert MRSA Assay (FDA     approved for NASAL specimens     only), is one component of a     comprehensive MRSA colonization     surveillance program. It is not     intended to diagnose MRSA     infection nor to guide or     monitor treatment for     MRSA infections.  CULTURE, BLOOD (ROUTINE  X 2)     Status: None   Collection Time    05/25/14  3:05 PM      Result Value Ref Range Status    Specimen Description BLOOD HEMODIALYSIS GRAFT AVF   Final   Special Requests BOTTLES DRAWN AEROBIC AND ANAEROBIC 10CC   Final   Culture  Setup Time     Final   Value: 05/25/2014 19:18     Performed at [facility]   Culture     Final   Value:        BLOOD CULTURE RECEIVED NO GROWTH TO DATE CULTURE WILL BE HELD FOR 5 DAYS BEFORE ISSUING A FINAL NEGATIVE REPORT     Performed at [facility]   Report Status PENDING   Incomplete  CULTURE, BLOOD (ROUTINE X 2)     Status: None   Collection Time    05/25/14  3:15 PM      Result Value Ref Range Status   Specimen Description HEMODIALYSIS GRAFT   Final   Special Requests BOTTLES DRAWN AEROBIC AND ANAEROBIC 10CC AVF   Final   Culture  Setup Time     Final   Value: 05/25/2014 19:22     Performed at [facility]   Culture     Final   Value:        BLOOD CULTURE RECEIVED NO GROWTH TO DATE CULTURE WILL BE HELD FOR 5 DAYS BEFORE ISSUING A FINAL NEGATIVE REPORT     Performed at [facility]   Report Status PENDING   Incomplete     Studies: No results found.  Scheduled Meds: . aspirin EC  325 mg Oral Daily  . atorvastatin  20 mg Oral QHS  . carbidopa-levodopa  0.5 tablet Oral TID  . darbepoetin (ARANESP) injection - DIALYSIS  60 mcg Intravenous Q Thu-HD  . divalproex  1,500 mg Oral Daily  . escitalopram  5 mg Oral Daily  . feeding supplement (NEPRO CARB STEADY)  237 mL Oral TID WC  . hydrocortisone   Rectal BID  . Influenza vac split quadrivalent PF  0.5 mL Intramuscular Tomorrow-1000  . metoprolol succinate  12.5 mg Oral QHS  . multivitamin  1 tablet Oral QHS  . sevelamer carbonate  3,200 mg Oral TID WC  . sucroferric oxyhydroxide  500 mg Oral Q supper   Continuous Infusions:  Antibiotics Given (last 72 hours)   Date/Time Action Medication Dose Rate   05/25/14 1754 Given  [given for HD]   vancomycin (VANCOCIN) IVPB 1000 mg/200 mL premix 1,000 mg 200 mL/hr   05/27/14 1153 Given   ceFAZolin (ANCEF) IVPB 2  g/50 mL premix 2 g 100 mL/hr      Active Problems:   HCAP (healthcare-associated pneumonia)    Time spent: 38min    Kanesha Cadle  [facility] Pager [phone]. If 7PM-7AM, please contact night-coverage at [email or web address], password [facility] 05/27/2014, 1:45 PM  LOS: 4 days

## 2014-05-27 NOTE — Progress Notes (Signed)
   Daily Progress Note  Assessment/Planning: Bleeding complication from L BC AVF   3rd or 4th bleeding event from this fistula so will plan on L arm fistulogram, possible revision of fistula tomorrow AM  Obtaining consent may be a problem  Subjective   By report, severe bleeding from L BC AVF including non-puncture sites today  Objective Filed Vitals:   05/27/14 1130 05/27/14 1200 05/27/14 1228 05/27/14 1245  BP: 106/49 97/56 102/45 110/50  Pulse: 83 84 83 82  Temp:  98.5 F (36.9 C)  98 F (36.7 C)  TempSrc:  Oral  Oral  Resp:  18    Height:      Weight:      SpO2:    98%    Intake/Output Summary (Last 24 hours) at 05/27/14 1458 Last data filed at 05/27/14 1246  Gross per 24 hour  Intake     60 ml  Output    432 ml  Net   -372 ml   VASC  L BC AVF with thrill and bruit, no active bleeding  Laboratory CBC    Component Value Date/Time   WBC 11.3* 05/27/2014 0500   WBC 8.0 02/09/2013 1350   HGB 8.4* 05/27/2014 0500   HCT 25.2* 05/27/2014 0500   PLT 279 05/27/2014 0500    BMET    Component Value Date/Time   NA 136* 05/27/2014 0759   NA 138 02/09/2013 1350   K 4.4 05/27/2014 0759   CL 93* 05/27/2014 0759   CO2 26 05/27/2014 0759   GLUCOSE 155* 05/27/2014 0759   GLUCOSE 137* 02/09/2013 1350   BUN 69* 05/27/2014 0759   BUN 25 02/09/2013 1350   CREATININE 8.37* 05/27/2014 0759   CALCIUM 9.6 05/27/2014 0759   GFRNONAA 6* 05/27/2014 0759   GFRAA 6* 05/27/2014 [place], MD [facility] [place]: [phone] Pager: [phone]  05/27/2014, 2:58 PM

## 2014-05-27 NOTE — Procedures (Signed)
I was present at this dialysis session, have reviewed the session itself and made  appropriate changes  Kelly Splinter MD (pgr) [phone]    ([phone] 05/27/2014, 1:05 PM

## 2014-05-27 NOTE — Progress Notes (Signed)
[facility] Progress Note  Assessment/Plan: 1. AMS- improving, due to infection, better than on admission 2. PNA- R infiltrate on xray  off Cefepime and Vanc - changed to Ancef Blood cultures- staph aureus. Afebrile. Sputum culture pending. 3. UTI- culture - Ecoli. antibx per pharm. Has had AMS in the past (may 2015) secondary to UTI, +Ecoli and was treated with Rocephin- has condom cath 4. MSSA bacteremia- on Ancef  ID evaluating, he did have a stitch abcess on the AVF the day of admission which drained a small amt of pus, there is no sign of active infection now, Dr. Jonnie Finner d/w VVS; last nite the area bled - looks ok now; for TEE Monday Continue to follow AVF for any signs of active infection 5. Tremor- Head CT- no acute event on 9/4. Follows with neurology, last appt 8/31- continue divalproex for seizure and carb/levo for tremor  6. ESRD - TTS @ [place]. Labs pending 7. Hypertension/volume - .  Goal 2 L BP ok 8. Anemia -hgb 10> 9.2 > 8.4 . ESA q Thursday, missed last weeks dose- given 9/8. No Fe, last tsat 28.- repeat CBC Sunday 9. Metabolic bone disease - ^ corr Ca P 6.1. Hold hectorol. 2 Ca+ bath. - last phos 8.9 (8/27) PTH 391 (7/23) Cont binders - renvela and velphoro 10. Nutrition - alb 2.9. Multi vit renal diet.  11. AVF bleeding-  bleeding on Tuesday/ Friday night ? If enough to explain Hgb drop 12. Spinal stenosis  [name] Jacobson, PA-C [place] [phone] 05/27/2014,9:04 AM  LOS: 4 days   Pt seen, examined, agree w assess/plan as above with additions as indicated.  [name] Splinter MD pager [phone]    cell [phone] 05/27/2014, 11:55 AM     Subjective:   How are you [name] [name]? Tells me he is eating better  Objective Filed Vitals:   05/27/14 0745 05/27/14 0800 05/27/14 0830 05/27/14 0900  BP: 126/67 110/60 107/67 115/64  Pulse: 82 76 81 76  Temp: 98.4 F (36.9 C)     TempSrc: Oral     Resp: 18 18    Height:      Weight: 120.8 kg  (266 lb 5.1 oz)     SpO2: 96%      Physical Exam General: alert and then dozes off in conversation Heart: RRR Lungs: no wheezes, rlaes Abdomen:obese soft G-U- condom cath Extremities: left BKA right no edema Dialysis Access: :left AVF  [facility]  4 hr 30 mins 118kg 2K/2Ca+ 350/8000 6000 Heparin L AVF  hectorol 4 mcg Aranesp 50 q week No Fe  Additional Objective Labs: Basic Metabolic Panel:  Recent Labs Lab 05/25/14 1249 05/26/14 1239 05/27/14 0759  NA 138 135* 136*  K 5.3 4.8 4.4  CL 92* 91* 93*  CO2 24 25 26   GLUCOSE 199* 166* 155*  BUN 79* 48* 69*  CREATININE 10.50* 7.03* 8.37*  CALCIUM 10.1 10.0 9.6  PHOS  --   --  6.1*   Liver Function Tests:  Recent Labs Lab 05/23/14 1156 05/27/14 0759  AST 12  --   ALT <5  --   ALKPHOS 66  --   BILITOT 0.2*  --   PROT 7.7  --   ALBUMIN 2.9* 2.3*  CBC:  Recent Labs Lab 05/23/14 1156 05/25/14 1249 05/26/14 1239 05/27/14 0500  WBC 16.1* 17.7* 12.3* 11.3*  HGB 9.0* 10.0* 9.2* 8.4*  HCT 27.3* 30.0* 27.7* 25.2*  MCV 97.8 100.3* 99.3 101.2*  PLT 309 201 331 279   Blood Culture    Component Value Date/Time   SDES HEMODIALYSIS GRAFT 05/25/2014 1515   SPECREQUEST BOTTLES DRAWN AEROBIC AND ANAEROBIC 10CC AVF 05/25/2014 1515   CULT  Value:        BLOOD CULTURE RECEIVED NO GROWTH TO DATE CULTURE WILL BE HELD FOR 5 DAYS BEFORE ISSUING A FINAL NEGATIVE REPORT Performed at [facility] 05/25/2014 1515   REPTSTATUS PENDING 05/25/2014 1515   Medications:   . aspirin EC  325 mg Oral Daily  . atorvastatin  20 mg Oral QHS  . carbidopa-levodopa  0.5 tablet Oral TID  .  ceFAZolin (ANCEF) IV  2 g Intravenous Q T,Th,Sa-HD  . darbepoetin (ARANESP) injection - DIALYSIS  60 mcg Intravenous Q Thu-HD  . divalproex  1,500 mg Oral Daily  . enoxaparin (LOVENOX) injection  30 mg Subcutaneous Q24H  . escitalopram  5 mg Oral Daily  . feeding supplement (NEPRO CARB STEADY)  237 mL Oral TID WC  . heparin  6,000  Units Dialysis Once in dialysis  . hydrocortisone   Rectal BID  . Influenza vac split quadrivalent PF  0.5 mL Intramuscular Tomorrow-1000  . metoprolol succinate  12.5 mg Oral QHS  . multivitamin  1 tablet Oral QHS  . sevelamer carbonate  3,200 mg Oral TID WC  . sucroferric oxyhydroxide  500 mg Oral Q supper

## 2014-05-28 ENCOUNTER — Encounter (HOSPITAL_COMMUNITY)
Admission: EM | Disposition: A | Discharge status: Skilled Nursing Facility | Payer: Self-pay | Source: Home / Self Care | Attending: Internal Medicine

## 2014-05-28 ENCOUNTER — Inpatient Hospital Stay (HOSPITAL_COMMUNITY): Payer: Medicare Other | Admitting: Anesthesiology

## 2014-05-28 ENCOUNTER — Encounter (HOSPITAL_COMMUNITY): Payer: Medicare Other | Admitting: Anesthesiology

## 2014-05-28 ENCOUNTER — Encounter (HOSPITAL_COMMUNITY): Payer: Self-pay | Admitting: Anesthesiology

## 2014-05-28 DIAGNOSIS — T82898A Other specified complication of vascular prosthetic devices, implants and grafts, initial encounter: Secondary | ICD-10-CM

## 2014-05-28 HISTORY — PX: FISTULOGRAM: SHX5832

## 2014-05-28 HISTORY — PX: REVISION OF ARTERIOVENOUS GORETEX GRAFT: SHX6073

## 2014-05-28 LAB — CBC
HEMATOCRIT: 27.7 % — AB (ref 39.0–52.0)
HEMOGLOBIN: 9.2 g/dL — AB (ref 13.0–17.0)
MCH: 32.9 pg (ref 26.0–34.0)
MCHC: 33.2 g/dL (ref 30.0–36.0)
MCV: 98.9 fL (ref 78.0–100.0)
Platelets: 389 10*3/uL (ref 150–400)
RBC: 2.8 MIL/uL — AB (ref 4.22–5.81)
RDW: 14 % (ref 11.5–15.5)
WBC: 13.9 10*3/uL — AB (ref 4.0–10.5)

## 2014-05-28 LAB — BASIC METABOLIC PANEL
Anion gap: 18 — ABNORMAL HIGH (ref 5–15)
BUN: 48 mg/dL — ABNORMAL HIGH (ref 6–23)
CO2: 26 mEq/L (ref 19–32)
Calcium: 9.6 mg/dL (ref 8.4–10.5)
Chloride: 90 mEq/L — ABNORMAL LOW (ref 96–112)
Creatinine, Ser: 6.66 mg/dL — ABNORMAL HIGH (ref 0.50–1.35)
GFR calc non Af Amer: 7 mL/min — ABNORMAL LOW (ref >90)
GFR, EST AFRICAN AMERICAN: 8 mL/min — AB (ref >90)
GLUCOSE: 149 mg/dL — AB (ref 70–99)
POTASSIUM: 4.9 meq/L (ref 3.7–5.3)
Sodium: 134 mEq/L — ABNORMAL LOW (ref 137–147)

## 2014-05-28 LAB — GLUCOSE, CAPILLARY: GLUCOSE-CAPILLARY: 139 mg/dL — AB (ref 70–99)

## 2014-05-28 SURGERY — FISTULOGRAM
Anesthesia: General | Site: Arm Upper

## 2014-05-28 MED ORDER — PROPOFOL 10 MG/ML IV BOLUS
INTRAVENOUS | Status: AC
  Filled 2014-05-28: qty 20

## 2014-05-28 MED ORDER — ALBUMIN HUMAN 5 % IV SOLN
INTRAVENOUS | Status: DC | PRN
  Administered 2014-05-28: 08:00:00 via INTRAVENOUS

## 2014-05-28 MED ORDER — ONDANSETRON HCL 4 MG/2ML IJ SOLN
INTRAMUSCULAR | Status: DC | PRN
  Administered 2014-05-28: 4 mg via INTRAVENOUS

## 2014-05-28 MED ORDER — ONDANSETRON HCL 4 MG/2ML IJ SOLN: 4.0000 mg | Freq: Four times a day (QID) | INTRAMUSCULAR | Status: DC | PRN

## 2014-05-28 MED ORDER — OXYCODONE HCL 5 MG/5ML PO SOLN: 5.0000 mg | Freq: Once | ORAL | Status: DC | PRN

## 2014-05-28 MED ORDER — CEFAZOLIN SODIUM-DEXTROSE 2-3 GM-% IV SOLR
INTRAVENOUS | Status: AC
  Filled 2014-05-28: qty 50

## 2014-05-28 MED ORDER — PROPOFOL 10 MG/ML IV BOLUS
INTRAVENOUS | Status: DC | PRN
  Administered 2014-05-28: 100 mg via INTRAVENOUS

## 2014-05-28 MED ORDER — CEFAZOLIN SODIUM-DEXTROSE 2-3 GM-% IV SOLR
INTRAVENOUS | Status: DC | PRN
  Administered 2014-05-28: 2 g via INTRAVENOUS

## 2014-05-28 MED ORDER — 0.9 % SODIUM CHLORIDE (POUR BTL) OPTIME
TOPICAL | Status: DC | PRN
  Administered 2014-05-28: 100 mL

## 2014-05-28 MED ORDER — SODIUM CHLORIDE 0.9 % IV SOLN
INTRAVENOUS | Status: DC | PRN
  Administered 2014-05-28: 07:00:00 via INTRAVENOUS

## 2014-05-28 MED ORDER — IOHEXOL 300 MG/ML  SOLN
INTRAMUSCULAR | Status: DC | PRN
  Administered 2014-05-28: 37 mL via INTRAVENOUS

## 2014-05-28 MED ORDER — PHENYLEPHRINE HCL 10 MG/ML IJ SOLN
INTRAMUSCULAR | Status: DC | PRN
  Administered 2014-05-28: 120 ug via INTRAVENOUS
  Administered 2014-05-28 (×3): 80 ug via INTRAVENOUS

## 2014-05-28 MED ORDER — FENTANYL CITRATE 0.05 MG/ML IJ SOLN: 25.0000 ug | INTRAMUSCULAR | Status: DC | PRN

## 2014-05-28 MED ORDER — FENTANYL CITRATE 0.05 MG/ML IJ SOLN
INTRAMUSCULAR | Status: DC | PRN
  Administered 2014-05-28: 50 ug via INTRAVENOUS
  Administered 2014-05-28: 100 ug via INTRAVENOUS
  Administered 2014-05-28: 50 ug via INTRAVENOUS

## 2014-05-28 MED ORDER — SODIUM CHLORIDE 0.9 % IR SOLN
Status: DC | PRN
  Administered 2014-05-28: 08:00:00

## 2014-05-28 MED ORDER — ROCURONIUM BROMIDE 50 MG/5ML IV SOLN
INTRAVENOUS | Status: AC
  Filled 2014-05-28: qty 1

## 2014-05-28 MED ORDER — OXYCODONE HCL 5 MG PO TABS: 5.0000 mg | ORAL_TABLET | Freq: Once | ORAL | Status: DC | PRN

## 2014-05-28 MED ORDER — PHENYLEPHRINE HCL 10 MG/ML IJ SOLN
10.0000 mg | INTRAVENOUS | Status: DC | PRN
  Administered 2014-05-28: 75 ug/min via INTRAVENOUS

## 2014-05-28 MED ORDER — FENTANYL CITRATE 0.05 MG/ML IJ SOLN
INTRAMUSCULAR | Status: AC
  Filled 2014-05-28: qty 5

## 2014-05-28 MED ORDER — LIDOCAINE HCL (CARDIAC) 20 MG/ML IV SOLN
INTRAVENOUS | Status: DC | PRN
  Administered 2014-05-28: 80 mg via INTRAVENOUS

## 2014-05-28 MED ORDER — ONDANSETRON HCL 4 MG/2ML IJ SOLN
INTRAMUSCULAR | Status: AC
  Filled 2014-05-28: qty 2

## 2014-05-28 SURGICAL SUPPLY — 80 items
BAG DECANTER FOR FLEXI CONT (MISCELLANEOUS) ×4 IMPLANT
BALLN MUSTANG 6.0X40 75 (BALLOONS) ×3
BALLN MUSTANG 6.0X40 75CM (BALLOONS) ×1
BALLN MUSTANG 8X80X75 (BALLOONS) ×4
BALLOON MUSTANG 6.0X40 75 (BALLOONS) ×2 IMPLANT
BALLOON MUSTANG 8X80X75 (BALLOONS) ×2 IMPLANT
BANDAGE ESMARK 6X9 LF (GAUZE/BANDAGES/DRESSINGS) ×2 IMPLANT
BNDG ESMARK 6X9 LF (GAUZE/BANDAGES/DRESSINGS) ×4
CANISTER SUCTION 2500CC (MISCELLANEOUS) ×4 IMPLANT
CATH CANNON HEMO 15F 50CM (CATHETERS) IMPLANT
CATH CANNON HEMO 15FR 19 (HEMODIALYSIS SUPPLIES) IMPLANT
CATH CANNON HEMO 15FR 23CM (HEMODIALYSIS SUPPLIES) IMPLANT
CATH CANNON HEMO 15FR 31CM (HEMODIALYSIS SUPPLIES) IMPLANT
CATH CANNON HEMO 15FR 32CM (HEMODIALYSIS SUPPLIES) IMPLANT
CATH STRAIGHT 5FR 65CM (CATHETERS) IMPLANT
CLIP TI MEDIUM 6 (CLIP) ×4 IMPLANT
CLIP TI WIDE RED SMALL 6 (CLIP) ×4 IMPLANT
COVER PROBE W GEL 5X96 (DRAPES) ×4 IMPLANT
COVER SURGICAL LIGHT HANDLE (MISCELLANEOUS) ×4 IMPLANT
CUFF TOURNIQUET SINGLE 18IN (TOURNIQUET CUFF) ×4 IMPLANT
DECANTER SPIKE VIAL GLASS SM (MISCELLANEOUS) ×4 IMPLANT
DERMABOND ADVANCED (GAUZE/BANDAGES/DRESSINGS) ×2
DERMABOND ADVANCED .7 DNX12 (GAUZE/BANDAGES/DRESSINGS) ×2 IMPLANT
DRAPE C-ARM 42X72 X-RAY (DRAPES) ×4 IMPLANT
DRAPE CHEST BREAST 15X10 FENES (DRAPES) ×4 IMPLANT
ELECT REM PT RETURN 9FT ADLT (ELECTROSURGICAL) ×4
ELECTRODE REM PT RTRN 9FT ADLT (ELECTROSURGICAL) ×2 IMPLANT
GAUZE SPONGE 2X2 8PLY STRL LF (GAUZE/BANDAGES/DRESSINGS) ×2 IMPLANT
GAUZE SPONGE 4X4 12PLY STRL (GAUZE/BANDAGES/DRESSINGS) ×4 IMPLANT
GAUZE SPONGE 4X4 16PLY XRAY LF (GAUZE/BANDAGES/DRESSINGS) ×4 IMPLANT
GEL ULTRASOUND 20GR AQUASONIC (MISCELLANEOUS) ×4 IMPLANT
GLOVE BIO SURGEON STRL SZ 6.5 (GLOVE) ×3 IMPLANT
GLOVE BIO SURGEON STRL SZ7 (GLOVE) ×4 IMPLANT
GLOVE BIO SURGEON STRL SZ7.5 (GLOVE) ×8 IMPLANT
GLOVE BIO SURGEONS STRL SZ 6.5 (GLOVE) ×1
GLOVE BIOGEL PI IND STRL 7.5 (GLOVE) ×6 IMPLANT
GLOVE BIOGEL PI INDICATOR 7.5 (GLOVE) ×6
GOWN STRL REUS W/ TWL LRG LVL3 (GOWN DISPOSABLE) ×6 IMPLANT
GOWN STRL REUS W/TWL LRG LVL3 (GOWN DISPOSABLE) ×6
INTRODUCER COOK 11FR (CATHETERS) IMPLANT
INTRODUCER SET COOK 14FR (MISCELLANEOUS) IMPLANT
KIT BASIN OR (CUSTOM PROCEDURE TRAY) ×4 IMPLANT
KIT ENCORE 26 ADVANTAGE (KITS) ×4 IMPLANT
KIT ROOM TURNOVER OR (KITS) ×4 IMPLANT
NEEDLE 18GX1X1/2 (RX/OR ONLY) (NEEDLE) ×4 IMPLANT
NEEDLE HYPO 25GX1X1/2 BEV (NEEDLE) ×4 IMPLANT
NEEDLE PERC 18GX7CM (NEEDLE) ×4 IMPLANT
NS IRRIG 1000ML POUR BTL (IV SOLUTION) ×4 IMPLANT
PACK CV ACCESS (CUSTOM PROCEDURE TRAY) ×4 IMPLANT
PACK SURGICAL SETUP 50X90 (CUSTOM PROCEDURE TRAY) ×4 IMPLANT
PAD ARMBOARD 7.5X6 YLW CONV (MISCELLANEOUS) ×8 IMPLANT
PADDING CAST COTTON 6X4 STRL (CAST SUPPLIES) ×4 IMPLANT
SET INTRODUCER 12FR PACEMAKER (SHEATH) IMPLANT
SET MICROPUNCTURE 5F STIFF (MISCELLANEOUS) IMPLANT
SHEATH PINNACLE R/O II 6F 4CM (SHEATH) ×4 IMPLANT
SOAP 2 % CHG 4 OZ (WOUND CARE) ×4 IMPLANT
SPONGE GAUZE 2X2 STER 10/PKG (GAUZE/BANDAGES/DRESSINGS) ×2
SPONGE SURGIFOAM ABS GEL 100 (HEMOSTASIS) IMPLANT
STOPCOCK 4 WAY LG BORE MALE ST (IV SETS) ×4 IMPLANT
STOPCOCK MORSE 400PSI 3WAY (MISCELLANEOUS) ×4 IMPLANT
SUT ETHILON 3 0 PS 1 (SUTURE) ×12 IMPLANT
SUT MNCRL AB 4-0 PS2 18 (SUTURE) ×8 IMPLANT
SUT PROLENE 6 0 BV (SUTURE) ×8 IMPLANT
SUT PROLENE 7 0 BV 1 (SUTURE) IMPLANT
SUT SILK 2 0 FS (SUTURE) IMPLANT
SUT VIC AB 3-0 SH 27 (SUTURE) ×4
SUT VIC AB 3-0 SH 27X BRD (SUTURE) ×4 IMPLANT
SYR 20CC LL (SYRINGE) ×8 IMPLANT
SYR 3ML LL SCALE MARK (SYRINGE) ×4 IMPLANT
SYR 5ML LL (SYRINGE) ×4 IMPLANT
SYR CONTROL 10ML LL (SYRINGE) ×4 IMPLANT
SYRINGE 10CC LL (SYRINGE) ×4 IMPLANT
TAPE CLOTH SURG 4X10 WHT LF (GAUZE/BANDAGES/DRESSINGS) ×4 IMPLANT
TOWEL OR 17X24 6PK STRL BLUE (TOWEL DISPOSABLE) ×4 IMPLANT
TOWEL OR 17X26 10 PK STRL BLUE (TOWEL DISPOSABLE) ×4 IMPLANT
TUBING EXTENTION W/L.L. (IV SETS) ×4 IMPLANT
UNDERPAD 30X30 INCONTINENT (UNDERPADS AND DIAPERS) ×4 IMPLANT
WATER STERILE IRR 1000ML POUR (IV SOLUTION) ×4 IMPLANT
WIRE AMPLATZ SS-J .035X180CM (WIRE) IMPLANT
WIRE BENTSON .035X145CM (WIRE) ×8 IMPLANT

## 2014-05-28 NOTE — Progress Notes (Signed)
Subjective:   Feels he is confused post OR, but actually alert and oriented.   Objective Filed Vitals:   05/28/14 1045 05/28/14 1100 05/28/14 1115 05/28/14 1204  BP: 92/39 92/60 99/39  130/50  Pulse: 74 77 75 76  Temp:   98.5 F (36.9 C) 98.3 F (36.8 C)  TempSrc:    Oral  Resp: 8 12 10 16   Height:      Weight:      SpO2: 100% 100% 100% 99%   Physical Exam General: Alert and oriented. Drowsy. No acute distress Heart: RRR. No murmur  Lungs: CTA, shallow. Unlabored  Abdomen: soft, nontender +BS gu- condom cath Extremities: L BKA. No edema Dialysis Access: L AVF s/p revision with Dr Bridgett Larsson 9.13- post op dressing intact.  [facility]  4 hr 30 mins 118kg 2K/2Ca+ 350/8000 6000 Heparin L AVF  hectorol 4 mcg Aranesp 50 q week No Fe   Assessment/Plan:  1. AMS- improving, due to infection  2. PNA- R infiltrate on xray off Cefepime and Vanc - changed to Ancef - but DC'd no current antibx. Blood cultures- staph aureus. Afebrile.  3. UTI- culture - Ecoli. antibx per pharm. Has had AMS in the past (may 2015) secondary to UTI, +Ecoli and was treated with Rocephin- has condom cath 4. MSSA bacteremia- on Ancef ID evaluating, he did have a stitch abcess on the AVF the day of admission which drained a small amt of pus. for TEE Monday  5. Tremor- Head CT- no acute event on 9/4. Follows with neurology, last appt 8/31- continue divalproex for seizure and carb/levo for tremor  6. ESRD - TTS @ [place].next HD Tuesday. K+ 4.4 7. Hypertension/volume - . 130/50 Under edw 8. Anemia -hgb 10> 9.2 > 8.4 . ESA q Thursday, missed last weeks dose- given 9/8. No Fe, last tsat 28.- repeat CBC today- post op 9. Metabolic bone disease - ^ corr Ca P 6.1. Hold hectorol. 2 Ca+ bath. - last phos 8.9 (8/27) PTH 391 (7/23) Cont binders - renvela and velphoro 10. Nutrition - alb 2.3. Multi vit renal diet.  11. AVF bleeding- s/p fistulogram and revision 9.13 with dr Bridgett Larsson Tissue defect in AVF repaired today by  Dr Bridgett Larsson, and also had PTA of venous stenoses upstream. Appreciate VVS assistance   12. Spinal stenosis   Shelle Iron, NP [place] [phone] 05/28/2014,12:38 PM  LOS: 5 days   Pt seen, examined, agree w assess/plan as above with additions as indicated.  Kelly Splinter MD pager [phone]    cell [phone] 05/28/2014, 2:14 PM     Additional Objective Labs: Basic Metabolic Panel:  Recent Labs Lab 05/25/14 1249 05/26/14 1239 05/27/14 0759  NA 138 135* 136*  K 5.3 4.8 4.4  CL 92* 91* 93*  CO2 24 25 26   GLUCOSE 199* 166* 155*  BUN 79* 48* 69*  CREATININE 10.50* 7.03* 8.37*  CALCIUM 10.1 10.0 9.6  PHOS  --   --  6.1*   Liver Function Tests:  Recent Labs Lab 05/23/14 1156 05/27/14 0759  AST 12  --   ALT <5  --   ALKPHOS 66  --   BILITOT 0.2*  --   PROT 7.7  --   ALBUMIN 2.9* 2.3*   No results found for this basename: LIPASE, AMYLASE,  in the last 168 hours CBC:  Recent Labs Lab 05/23/14 1156 05/25/14 1249 05/26/14 1239 05/27/14 0500  WBC 16.1* 17.7* 12.3* 11.3*  HGB 9.0* 10.0* 9.2* 8.4*  HCT 27.3* 30.0* 27.7* 25.2*  MCV 97.8 100.3* 99.3 101.2*  PLT 309 201 331 279   Blood Culture    Component Value Date/Time   SDES HEMODIALYSIS GRAFT 05/25/2014 1515   SPECREQUEST BOTTLES DRAWN AEROBIC AND ANAEROBIC 10CC AVF 05/25/2014 1515   CULT  Value:        BLOOD CULTURE RECEIVED NO GROWTH TO DATE CULTURE WILL BE HELD FOR 5 DAYS BEFORE ISSUING A FINAL NEGATIVE REPORT Performed at [place] 05/25/2014 1515   REPTSTATUS PENDING 05/25/2014 1515    Cardiac Enzymes: No results found for this basename: CKTOTAL, CKMB, CKMBINDEX, TROPONINI,  in the last 168 hours CBG:  Recent Labs Lab 05/28/14 1130  GLUCAP 139*   Iron Studies: No results found for this basename: IRON, TIBC, TRANSFERRIN, FERRITIN,  in the last 72 hours @lablastinr3 @ Studies/Results: No results found. Medications:   . aspirin EC  325 mg Oral Daily  .  atorvastatin  20 mg Oral QHS  . carbidopa-levodopa  0.5 tablet Oral TID  . darbepoetin (ARANESP) injection - DIALYSIS  60 mcg Intravenous Q Thu-HD  . divalproex  1,500 mg Oral Daily  . escitalopram  5 mg Oral Daily  . feeding supplement (NEPRO CARB STEADY)  237 mL Oral TID WC  . hydrocortisone   Rectal BID  . Influenza vac split quadrivalent PF  0.5 mL Intramuscular Tomorrow-1000  . metoprolol succinate  12.5 mg Oral QHS  . multivitamin  1 tablet Oral QHS  . sevelamer carbonate  3,200 mg Oral TID WC  . sucroferric oxyhydroxide  500 mg Oral Q supper

## 2014-05-28 NOTE — Transfer of Care (Signed)
Immediate Anesthesia Transfer of Care Note  Patient: [name] [name]  Procedure(s) Performed: Procedure(s) with comments: FISTULOGRAM (ARM) (Left) - Angioplasty of left sephalic vein times two. POSSIBLE REVISION OF FISTULA (Left) - Revision of Left arm fistula.  Patient Location: PACU  Anesthesia Type:General  Level of Consciousness: awake, alert , oriented and patient cooperative  Airway & Oxygen Therapy: Patient Spontanous Breathing and Patient connected to nasal cannula oxygen  Post-op Assessment: Report given to PACU RN, Post -op Vital signs reviewed and stable and Patient moving all extremities X 4  Post vital signs: Reviewed and stable  Complications: No apparent anesthesia complications

## 2014-05-28 NOTE — Anesthesia Postprocedure Evaluation (Signed)
Anesthesia Post Note  Patient: [name] [name]  Procedure(s) Performed: Procedure(s) (LRB): FISTULOGRAM (ARM) (Left) POSSIBLE REVISION OF FISTULA (Left)  Anesthesia type: General  Patient location: PACU  Post pain: Pain level controlled and Adequate analgesia  Post assessment: Post-op Vital signs reviewed, Patient's Cardiovascular Status Stable, Respiratory Function Stable, Patent Airway and Pain level controlled  Last Vitals:  Filed Vitals:   05/28/14 1034  BP: 127/15  Pulse: 78  Temp:   Resp: 8    Post vital signs: Reviewed and stable  Level of consciousness: awake, alert  and oriented  Complications: No apparent anesthesia complications

## 2014-05-28 NOTE — H&P (View-Only) (Signed)
Agree with above.  Deitra Mayo, MD, [place] [phone] 05/18/2014

## 2014-05-28 NOTE — Anesthesia Preprocedure Evaluation (Signed)
Anesthesia Evaluation  Patient identified by MRN, date of birth, ID band Patient awake    Reviewed: Allergy & Precautions, H&P , NPO status , Patient's Chart, lab work & pertinent test results  Airway Mallampati: II  Neck ROM: full    Dental   Pulmonary          Cardiovascular hypertension, + angina + Peripheral Vascular Disease and +CHF  LHC (9/14):  Normal cors, EF 65%.   Neuro/Psych PSYCHIATRIC DISORDERS CVA    GI/Hepatic GERD-  ,  Endo/Other  diabetes, Type 2Morbid obesity  Renal/GU ESRF and DialysisRenal disease     Musculoskeletal  (+) Arthritis -,   Abdominal   Peds  Hematology  (+) anemia ,   Anesthesia Other Findings   Reproductive/Obstetrics                           Anesthesia Physical Anesthesia Plan  ASA: III  Anesthesia Plan: General   Post-op Pain Management:    Induction: Intravenous  Airway Management Planned: LMA  Additional Equipment:   Intra-op Plan:   Post-operative Plan:   Informed Consent: I have reviewed the patients History and Physical, chart, labs and discussed the procedure including the risks, benefits and alternatives for the proposed anesthesia with the patient or authorized representative who has indicated his/her understanding and acceptance.     Plan Discussed with: CRNA, Anesthesiologist and Surgeon  Anesthesia Plan Comments:         Anesthesia Quick Evaluation

## 2014-05-28 NOTE — Progress Notes (Addendum)
[facility] PROGRESS NOTE  [name] [name] KPT:[id number] DOB: 11-Feb-1941 DOA: 05/23/2014 PCP: Donetta Potts, MD  Assessment/Plan: MSSA bacteremia likely due to HCAP/PNA - R infiltrate on xray - has LUE AVF for HD and stitch abscesses on graft, VVS aware, s/p revision 9/13 due to bleeding from AVF - continue Ancef per ID - repeat Blood CX from 9/10 NGTD - MRI R hip per ID recs pending - i called Cards for TEE as recommended by ID, scheduled for Monday  Ecoli UTI - pan sensitive, > 100K colonies - completed 4days of cefepime, now on ancef  Metabolic encephalopathy -due to 1,2, gabapentin, narcotics -Gabapentin and percocet held -improving  Spinal stenosis and cervical spondylosis -FU with Dr.Elsner -Narcotics were on hold due to mentation -resumed low dose due to severe chronic pain  Seizure d/o -continue Depakote, level slightly low  Tremors -continue carb/levo   ESRD  -HD per Renal  Hypertension/volume  -stable, continue TOprol  Anemia of chronic disease -Aranesp with HD   AVF bleeding yesterday -per Renal/VVS  DVt proph: lovenox  Code Status: Full Code Family Communication: no family at bedside, attempted to reach wife [name] x2 @ [phone], finally was able to contact wife 9/11; Disposition Plan: SNF when stable   Consultants:  Renal  Antibiotics:  Cefepime/Vanc 9/8 -9/11  Ancef 9/11  HPI/Subjective: Just back from PACU, some bleeding from AVF site  Objective: Filed Vitals:   05/28/14 1950  BP: 116/61  Pulse: 90  Temp: 100.2 F (37.9 C)  Resp: 18    Intake/Output Summary (Last 24 hours) at 05/28/14 2034 Last data filed at 05/28/14 1950  Gross per 24 hour  Intake    770 ml  Output    250 ml  Net    520 ml   Filed Weights   05/27/14 0745 05/27/14 1947 05/28/14 1946  Weight: 120.8 kg (266 lb 5.1 oz) 113.1 kg (249 lb 5.4 oz) 114.7 kg (252 lb 13.9 oz)    Exam:   General:  Awake, more alert, oriented to self and place,  less confused  Cardiovascular: S1S2/RRR  Respiratory: poor air movt  Abdomen: soft, Nt, BS present  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, L BKA  NEuro: moves all ext, no localizing signs   Data Reviewed: Basic Metabolic Panel:  Recent Labs Lab 05/23/14 1156 05/25/14 1249 05/26/14 1239 05/27/14 0759 05/28/14 1640  NA 136* 138 135* 136* 134*  K 5.3 5.3 4.8 4.4 4.9  CL 90* 92* 91* 93* 90*  CO2 22 24 25 26 26   GLUCOSE 180* 199* 166* 155* 149*  BUN 94* 79* 48* 69* 48*  CREATININE 12.63* 10.50* 7.03* 8.37* 6.66*  CALCIUM 10.1 10.1 10.0 9.6 9.6  PHOS  --   --   --  6.1*  --    Liver Function Tests:  Recent Labs Lab 05/23/14 1156 05/27/14 0759  AST 12  --   ALT <5  --   ALKPHOS 66  --   BILITOT 0.2*  --   PROT 7.7  --   ALBUMIN 2.9* 2.3*   No results found for this basename: LIPASE, AMYLASE,  in the last 168 hours No results found for this basename: AMMONIA,  in the last 168 hours CBC:  Recent Labs Lab 05/23/14 1156 05/25/14 1249 05/26/14 1239 05/27/14 0500 05/28/14 1640  WBC 16.1* 17.7* 12.3* 11.3* 13.9*  HGB 9.0* 10.0* 9.2* 8.4* 9.2*  HCT 27.3* 30.0* 27.7* 25.2* 27.7*  MCV 97.8 100.3* 99.3 101.2* 98.9  PLT 309 201 331 279 389  Cardiac Enzymes: No results found for this basename: CKTOTAL, CKMB, CKMBINDEX, TROPONINI,  in the last 168 hours BNP (last 3 results)  Recent Labs  01/15/14 1316  PROBNP 1399.0*   CBG:  Recent Labs Lab 05/28/14 1130  GLUCAP 139*    Recent Results (from the past 240 hour(s))  URINE CULTURE     Status: None   Collection Time    05/23/14 11:50 AM      Result Value Ref Range Status   Specimen Description URINE, RANDOM   Final   Special Requests NONE   Final   Culture  Setup Time     Final   Value: 05/23/2014 17:20     Performed at [place]     Final   Value: >=100,000 COLONIES/ML     Performed at [facility]   Culture     Final   Value: ESCHERICHIA COLI     Performed at [facility]   Report Status 05/26/2014 FINAL   Final   Organism ID, Bacteria ESCHERICHIA COLI   Final  CULTURE, BLOOD (ROUTINE X 2)     Status: None   Collection Time    05/23/14  1:17 PM      Result Value Ref Range Status   Specimen Description BLOOD HAND RIGHT   Final   Special Requests BOTTLES DRAWN AEROBIC ONLY 3CC   Final   Culture  Setup Time     Final   Value: 05/23/2014 16:42     Performed at [facility]   Culture     Final   Value: STAPHYLOCOCCUS AUREUS     Note: RIFAMPIN AND GENTAMICIN SHOULD NOT BE USED AS SINGLE DRUGS FOR TREATMENT OF STAPH INFECTIONS.     Note: Gram Stain Report Called to,Read Back By and Verified With: BONITA@12 :50PM ON 05/24/14 BY DANTS     Performed at [facility]   Report Status 05/26/2014 FINAL   Final   Organism ID, Bacteria STAPHYLOCOCCUS AUREUS   Final  CULTURE, BLOOD (ROUTINE X 2)     Status: None   Collection Time    05/23/14  2:15 PM      Result Value Ref Range Status   Specimen Description BLOOD RIGHT HAND   Final   Special Requests BOTTLES DRAWN AEROBIC AND ANAEROBIC 5CC   Final   Culture  Setup Time     Final   Value: 05/23/2014 18:52     Performed at [facility]   Culture     Final   Value: STAPHYLOCOCCUS AUREUS     Note: SUSCEPTIBILITIES PERFORMED ON PREVIOUS CULTURE WITHIN THE LAST 5 DAYS.     Note: Gram Stain Report Called to,Read Back By and Verified With: JULIA OSBORNE ON 05/24/2014 AT 8:52P BY WILEJ     Performed at [facility]   Report Status 05/26/2014 FINAL   Final  MRSA PCR SCREENING     Status: None   Collection Time    05/23/14  3:49 PM      Result Value Ref Range Status   MRSA by PCR NEGATIVE  NEGATIVE Final   Comment:            The GeneXpert MRSA Assay (FDA     approved for NASAL specimens     only), is one component of a     comprehensive MRSA colonization     surveillance program. It is not     intended to diagnose  MRSA     infection nor to guide or     monitor treatment for      MRSA infections.  CULTURE, BLOOD (ROUTINE X 2)     Status: None   Collection Time    05/25/14  3:05 PM      Result Value Ref Range Status   Specimen Description BLOOD HEMODIALYSIS GRAFT AVF   Final   Special Requests BOTTLES DRAWN AEROBIC AND ANAEROBIC 10CC   Final   Culture  Setup Time     Final   Value: 05/25/2014 19:18     Performed at [facility]   Culture     Final   Value:        BLOOD CULTURE RECEIVED NO GROWTH TO DATE CULTURE WILL BE HELD FOR 5 DAYS BEFORE ISSUING A FINAL NEGATIVE REPORT     Performed at [facility]   Report Status PENDING   Incomplete  CULTURE, BLOOD (ROUTINE X 2)     Status: None   Collection Time    05/25/14  3:15 PM      Result Value Ref Range Status   Specimen Description HEMODIALYSIS GRAFT   Final   Special Requests BOTTLES DRAWN AEROBIC AND ANAEROBIC 10CC AVF   Final   Culture  Setup Time     Final   Value: 05/25/2014 19:22     Performed at [facility]   Culture     Final   Value:        BLOOD CULTURE RECEIVED NO GROWTH TO DATE CULTURE WILL BE HELD FOR 5 DAYS BEFORE ISSUING A FINAL NEGATIVE REPORT     Performed at [facility]   Report Status PENDING   Incomplete     Studies: No results found.  Scheduled Meds: . aspirin EC  325 mg Oral Daily  . atorvastatin  20 mg Oral QHS  . carbidopa-levodopa  0.5 tablet Oral TID  . darbepoetin (ARANESP) injection - DIALYSIS  60 mcg Intravenous Q Thu-HD  . divalproex  1,500 mg Oral Daily  . escitalopram  5 mg Oral Daily  . feeding supplement (NEPRO CARB STEADY)  237 mL Oral TID WC  . hydrocortisone   Rectal BID  . Influenza vac split quadrivalent PF  0.5 mL Intramuscular Tomorrow-1000  . metoprolol succinate  12.5 mg Oral QHS  . multivitamin  1 tablet Oral QHS  . sevelamer carbonate  3,200 mg Oral TID WC  . sucroferric oxyhydroxide  500 mg Oral Q supper   Continuous Infusions:  Antibiotics Given (last 72 hours)   Date/Time Action Medication Dose Rate   05/27/14 1153  Given   ceFAZolin (ANCEF) IVPB 2 g/50 mL premix 2 g 100 mL/hr      Active Problems:   HCAP (healthcare-associated pneumonia)    Time spent: 53min    Jerrilyn Messinger  [facility] Pager [phone]. If 7PM-7AM, please contact night-coverage at [email or web address], password [facility] 05/28/2014, 8:34 PM  LOS: 5 days

## 2014-05-28 NOTE — Interval H&P Note (Signed)
/  [facility]   History and Physical Update  The patient was interviewed and re-examined.  The patien/t's previous History and Physical has been reviewed and is unchanged from his recent consult on: 05/18/14 except for: interval episodes of dialysis access bleeding.  There is no change in the plan of care: L arm fistulogram, possible surgical revision of the fistula.  I offered to discuss the risks and benefits with the family but they declined to call back 05/27/14.  Adele Barthel, MD [facility] [place] Office: [phone] Pager: [phone]  05/28/2014, 7:41 AM

## 2014-05-28 NOTE — OR Nursing (Signed)
Patients x2 silver toned rings and watched placed back on patient in the Pac[facility].

## 2014-05-28 NOTE — H&P (View-Only) (Signed)
[facility] [place]  Reason for Consult:  Bleeding fistula Referring Physician:  EDP MRN #:  [id number]  History of Present Illness: This is a 73 y.o. 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 He was scheduled for dialysis today, but his fistula started bleeding before he was able to get dialyzed. The fistula is cannulated with a button hole technique. He is unsure of how much blood was lost, but he says it was not a lot. He denies any pain in his hands.   His fistula was placed a year and a half ago in [place]. He moved to [place] in 2014. There are no records on epic of his past vascular surgeries. He reports multiple revisions to his fistula along with thrombectomies for clotted fistulas.   He has hypertension managed with on a beta blocker. He has hyperlipidemia managed with a statin. He has diabetes. No records of diabetes medications on record. He takes a daily aspirin.   Past Medical History  Diagnosis Date  . ESRD on hemodialysis     Started HD around 2009, maybe longer.  Was living in [place], [place] then.  Moved to [place] in 2014 and gets HD now at [facility] on [address] on a TTS schedule.  ESRD was due to DM and HTN.     Marland Kitchen Peripheral vascular disease   . Hypertension   . Hyperlipidemia   . Arthritis     Gout  . Paroxysmal atrial fibrillation   . Brain tumor 2009  . Seizure disorder 2009  . Anemia   . Hyperparathyroidism, secondary   . Diabetes mellitus without complication     Type II, diet controlled  . Proteus mirabilis infection   . CHF (congestive heart failure)     grade 1 DD, preserved EF, [facility] records  . 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 brain tumor  . GERD (gastroesophageal reflux disease)   . Hx of cardiac cath     a. LHC (9/14):  Normal cors, EF 65%.     Past Surgical History  Procedure Laterality Date  . Av fistula placement Left     arm  . 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 Eye surgery Bilateral 2000    cataract  . Retinopathy surgery      Hx. of laser treatments for diabetics    No Known Allergies  Prior to Admission medications   Medication Sig Start Date End Date Taking? Authorizing Provider  acetaminophen (TYLENOL) 325 MG tablet Take 325 mg by mouth every 6 (six) hours as needed for moderate pain or fever.    Historical Provider, MD  aspirin EC 325 MG tablet Take 325 mg by mouth daily.    Historical Provider, MD  atorvastatin (LIPITOR) 20 MG tablet Take 30 mg by mouth at bedtime.    Historical Provider, MD  B Complex-C-Folic Acid (RENO CAPS) 1 MG CAPS Take 1 mg by mouth every evening. 8pm    Historical Provider, MD  carbidopa-levodopa (SINEMET IR) 25-100 MG per tablet Take 0.5 tablets by mouth 3 (three) times daily.    Historical Provider, MD  Cholecalciferol (VITAMIN D3) 1000 UNITS CAPS Take 1 capsule by mouth daily.    Historical Provider, MD  divalproex (  DEPAKOTE) 500 MG DR tablet Take 3 tablets (1,500 mg total) by mouth daily. 01/24/14   Ripudeep Krystal Eaton, MD  escitalopram (LEXAPRO) 10 MG tablet Take 1 tablet (10 mg total) by mouth daily. 01/24/14   Ripudeep Krystal Eaton, MD  gabapentin (NEURONTIN) 300 MG capsule Take 300 mg by mouth 2 (two) times daily.     Historical Provider, MD  metoprolol tartrate (LOPRESSOR) 25 MG tablet Take 6.25 mg by mouth 2 (two) times daily. Take 6.25mg  by mouth daily    Historical Provider, MD  nitroGLYCERIN (NITROSTAT) 0.4 MG SL tablet Place 1 tablet (0.4 mg total) under the tongue every 5 (five) minutes x 3 doses as needed for chest pain. 05/28/13   Nita Sells, MD  omeprazole (PRILOSEC OTC) 20 MG tablet Take 20 mg by mouth daily as needed (heartburn).     Historical Provider, MD  oxyCODONE-acetaminophen (PERCOCET/ROXICET) 5-325 MG per  tablet Take 1 tablet by mouth every 6 (six) hours as needed for severe pain.    Historical Provider, MD  senna-docusate (SENOKOT-S) 8.6-50 MG per tablet Take 1 tablet by mouth at bedtime as needed for mild constipation. 01/24/14   Ripudeep Krystal Eaton, MD  simethicone (MYLICON) 80 MG chewable tablet Chew 80 mg by mouth 2 (two) times daily as needed for flatulence.    Historical Provider, MD  sucroferric oxyhydroxide (VELPHORO) 500 MG chewable tablet Chew 1,000 mg by mouth 3 (three) times daily with meals.    Historical Provider, MD  Sun Name: BIO FREEZE    Historical Provider, MD    History   Social History  . Marital Status: Married    Spouse Name: [name]    Number of Children: 3  . Years of Education: 12th   Occupational History  .      N/A   Social History Main Topics  . Smoking status: Never Smoker   . Smokeless tobacco: Never Used  . Alcohol Use: 0.6 oz/week    1 Shots of liquor per week  . Drug Use: No  . Sexual Activity: No   Other Topics Concern  . Not on file   Social History Narrative   Pt lives at home with spouse.   Caffeine Use: 1-2 cups daily.   Uses a cane for ambulation and prosthetic limb  Left leg.     Family History  Problem Relation Age of Onset  . Seizures Mother   . Stroke Mother 54  . Hypertension Mother   . Alcohol abuse Father   . Hypertension Father   . Arthritis Sister   . Heart attack Neg Hx   . Heart failure Neg Hx     ROS: [x]  Positive   [ ]  Negative   [ ]  All sytems reviewed and are negative  Cardiovascular: []  chest pain/pressure []  palpitations []  SOB lying flat []  DOE []  pain in legs while walking []  pain in legs at rest []  pain in legs at night []  non-healing ulcers []  hx of DVT []  swelling in legs  Pulmonary: []  productive cough []  asthma/wheezing []  home O2  Neurologic: []  weakness in []  arms []  legs []  numbness in []  arms []  legs []  hx of CVA []  mini stroke [] difficulty speaking or slurred  speech []  temporary loss of vision in one eye []  dizziness  Hematologic: []  hx of cancer []  bleeding problems []  problems with blood clotting easily  Endocrine:   [x]  diabetes []  thyroid disease  GI []  vomiting blood []  blood in  stool  GU: [x]  CKD/renal failure []  HD--[]  M/W/F or [x]  T/T/S []  burning with urination []  blood in urine  Psychiatric: []  anxiety []  depression  Musculoskeletal: []  arthritis []  joint pain  Integumentary: []  rashes []  ulcers  Constitutional: []  fever []  chills   Physical Examination  Filed Vitals:   05/18/14 1326  BP: 130/52  Pulse: 75  Temp:   Resp: 14   Body mass index is 31.9 kg/(m^2).  General:  WD obese male in NAD Gait: Not observed HENT: WNL, normocephalic Pulmonary: normal non-labored breathing, without Rales, rhonchi,  wheezing Cardiac: regular, without  Murmurs, rubs or gallops; without carotid bruits Abdomen: soft, NT/ND, no masses Skin: without rashes, without ulcers  Vascular Exam/Pulses: palpable radial pulses bilaterally.  Extremities:  Pulsatile left upper arm AV fistula. Small puncture site with previous active bleeding. Previous dialysis cannulation sites with eschar. left BKA with prosthesis. Right foot is warm. Musculoskeletal: no muscle wasting or atrophy  Neurologic: A&O X 3; Appropriate Affect ; Sensation and motor intact to all extremities. Speech is fluent/normal  CBC    Component Value Date/Time   WBC 14.1* 01/24/2014 0846   WBC 8.0 02/09/2013 1350   RBC 3.46* 01/24/2014 0846   RBC 3.43* 02/09/2013 1350   HGB 10.8* 01/24/2014 0846   HCT 32.1* 01/24/2014 0846   PLT 278 01/24/2014 0846   MCV 92.8 01/24/2014 0846   MCH 31.2 01/24/2014 0846   MCH 31.8 02/09/2013 1350   MCHC 33.6 01/24/2014 0846   MCHC 34.3 02/09/2013 1350   RDW 14.7 01/24/2014 0846   RDW 13.7 02/09/2013 1350   LYMPHSABS 2.1 01/16/2014 0405   LYMPHSABS 2.5 02/09/2013 1350   MONOABS 1.4* 01/16/2014 0405   EOSABS 1.4* 01/16/2014 0405   EOSABS 0.2  02/09/2013 1350   BASOSABS 0.0 01/16/2014 0405   BASOSABS 0.0 02/09/2013 1350    BMET    Component Value Date/Time   NA 129* 01/24/2014 0846   NA 138 02/09/2013 1350   K 5.0 01/24/2014 0846   CL 85* 01/24/2014 0846   CO2 24 01/24/2014 0846   GLUCOSE 197* 01/24/2014 0846   GLUCOSE 137* 02/09/2013 1350   BUN 82* 01/24/2014 0846   BUN 25 02/09/2013 1350   CREATININE 10.04* 01/24/2014 0846   CALCIUM 9.5 01/24/2014 0846   GFRNONAA 4* 01/24/2014 0846   GFRAA 5* 01/24/2014 0846    COAGS: Lab Results  Component Value Date   INR 0.97 05/26/2013    ASSESSMENT: This is a 73 y.o. male with ESRD on dialysis with bleeding left upper extremity fistula.   PLAN: The fistula was no longer bleeding upon entering the patient's room. There was pulsatile flow upon palpation of the fistula and some minor bleeding began. Pressure was held and the bleeding ceased shortly after. The wound was clean and prepped and 1cc of 2% lidocaine without epinephrine was injected surround the wound. One horizontal mattress stitch using 4.0 nylon was placed to secure the wound. The wound was dressed with gauze and tape.    He is getting a CBC to evaluate his acute blood loss. He is safe for discharge from a vascular standpoint. His dialysis center was contacted and arrangements were made for him to dialyze this afternoon. He will need to follow up with [facility] for a  fistulogram at some point in the future due to his pulsatile fistula.    Virgina Jock, PA-C [facility] Office: [phone] Pager: [phone]

## 2014-05-28 NOTE — Op Note (Signed)
OPERATIVE NOTE   PROCEDURE: 1.  left brachiocephalic arteriovenous fistula cannulation under ultrasound guidance 2.  left arm fistulogram 3.  Venoplasty of cephalic vein x 2 (6 mm x 40 mm, 8 mm x 80 mm) 4.  Revision of distal left brachiocephalic arteriovenous fistula    PRE-OPERATIVE DIAGNOSIS: Severe bleeding from left brachiocephalic arteriovenous fistula  POST-OPERATIVE DIAGNOSIS: same as above   SURGEON: Adele Barthel, MD  ANESTHESIA: local  ESTIMATED BLOOD LOSS: 5 cc  FINDING(S): 1. Serial stenoses in fistula >50% in mid-segment: near resolved at end of case 2. Widely patent central venous system 3. Stent in left innominate vein 4. 5 mm hole in distal fistula at button hole site 5. Palpable thrill at end of case  SPECIMEN(S):  None  CONTRAST: 50 cc  INDICATIONS: [name] [name] is a 73 y.o. male who presents with repeated episodes of severe bleeding from left brachiocephalic arteriovenous fistula.  As this was the 3rd episode, I felt further investigation with a fistulogram was necessary.  The plan was to revision the fistula as indicated by the fistulogram.  The patient is confused so consent was obtained from the patient's son.  The risks include but are not limited to: bleeding, infection, thrombosis of the cannulated access, and possible anaphylactic reaction to the contrast.    DESCRIPTION: After full informed written consent was obtained, the patient was brought back to the angiography suite and placed supine upon the hybrid OR table.  The patient was connected to monitoring equipment.  The left arm was prepped and draped in the standard fashion for a percutaneous access intervention.  Immediate upon inspect, I suspect a chronic tract at the site of the distal button hole access.  Surgical investigation of this site was going to be necessary.  Under ultrasound guidance, the left brachiocephalic arteriovenous fistula was cannulated with a micropuncture needle.  The  microwire was advanced into the fistula and the needle was exchanged for the a microsheath, which was lodged 2 cm into the access.  The wire was removed and the sheath was connected to the IV extension tubing.  Hand injections were completed to image the access from the antecubitum up to the level of axilla.  The central venous structures were also imaged by hand injections.  Based on the images, this patient will need: venoplasty of the serial stenoses.  A Benson wire was advanced into the axillary vein and the sheath was exchanged for a short 6-Fr sheath.  Based on the the imaging, a 6 mm x 40 mm angioplasty balloon was selected.  The balloon was inflated to 16 atm for 2 minutes throughout the mid-segment of this fistula where the stenoses were present.  Limited waists were noted.  On completion imaging, a >30% residual stenosis is present.  At this point, the balloon was exchanged for a  8 mm x 80 mm angioplasty balloon.  The balloon was inflated to 16 atm for 2 minutes throughout the mid-segment of this fistula where the stenoses were present.  On completion imaging, there was near resolution of the stenosis.  Based on the completion imaging, no further endovascular intervention is necessary.  The wire and balloon were removed from the sheath.  A 4-0 Monocryl purse-string suture was sewn around the sheath.  The sheath was removed while tying down the suture.    At this point, the bed was rotated for the surgical exploration of the distal button hole tract.  I made an elliptic incision around the  button hole tract and dissected out to the fistula.  At first, I thought this was a small hole in the fistula, when it began actively bleeding.  I placed a sterile tourniquet and inflated it to obtain hemostatic control.  After washing out the button hole tract, it became evident that there was a ruptured PSA at the button tract with a 5 mm defect.  I repaired this with a running stitch of 6-0 Prolene.  The tourniquet  was only inflated for ~5 minutes and after releasing the tourniquet there continued to be a thrill in the fistula.  I controlled bleeding in the subcutaneous tissue with electrocautery.  I repaired this tract a layer of 3-0 Vicryl in the subcutaneous tissue.  I repaired the skin with interrupted 3-0 Nylon stitches.  The skin was cleaned, dried, and sterile pressure dressing placed over this incision and the cannulation site.    COMPLICATIONS: none  CONDITION: stable  Adele Barthel, MD [facility] [place] Office: [phone] Pager: [phone]  05/28/2014 9:53 AM

## 2014-05-29 ENCOUNTER — Inpatient Hospital Stay (HOSPITAL_COMMUNITY): Payer: Medicare Other

## 2014-05-29 ENCOUNTER — Telehealth: Payer: Self-pay | Admitting: Surgery

## 2014-05-29 ENCOUNTER — Encounter (HOSPITAL_COMMUNITY): Payer: Self-pay | Admitting: *Deleted

## 2014-05-29 ENCOUNTER — Encounter (HOSPITAL_COMMUNITY)
Admission: EM | Disposition: A | Discharge status: Skilled Nursing Facility | Payer: Self-pay | Source: Home / Self Care | Attending: Internal Medicine

## 2014-05-29 HISTORY — PX: TEE WITHOUT CARDIOVERSION: SHX5443

## 2014-05-29 SURGERY — ECHOCARDIOGRAM, TRANSESOPHAGEAL
Anesthesia: Moderate Sedation

## 2014-05-29 MED ORDER — MIDAZOLAM HCL 10 MG/2ML IJ SOLN
INTRAMUSCULAR | Status: DC | PRN
  Administered 2014-05-29 (×2): 1 mg via INTRAVENOUS

## 2014-05-29 MED ORDER — CEFAZOLIN SODIUM-DEXTROSE 2-3 GM-% IV SOLR
2.0000 g | INTRAVENOUS | Status: DC
Start: 2014-05-30 — End: 2014-05-30
  Filled 2014-05-29: qty 50

## 2014-05-29 MED ORDER — LIDOCAINE VISCOUS 2 % MT SOLN
OROMUCOSAL | Status: DC | PRN
Start: 2014-05-29 — End: 2014-05-29
  Administered 2014-05-29: 10 mL via OROMUCOSAL

## 2014-05-29 MED ORDER — SODIUM CHLORIDE 0.9 % IV SOLN
INTRAVENOUS | Status: DC
Start: 2014-05-29 — End: 2014-05-29
  Administered 2014-05-29: 500 mL via INTRAVENOUS

## 2014-05-29 MED ORDER — LORAZEPAM 2 MG/ML IJ SOLN
1.0000 mg | Freq: Once | INTRAMUSCULAR | Status: AC
  Administered 2014-05-29: 0.5 mg via INTRAVENOUS
  Filled 2014-05-29: qty 1

## 2014-05-29 MED ORDER — DIPHENHYDRAMINE HCL 50 MG/ML IJ SOLN
INTRAMUSCULAR | Status: AC
  Filled 2014-05-29: qty 1

## 2014-05-29 MED ORDER — FENTANYL CITRATE 0.05 MG/ML IJ SOLN
INTRAMUSCULAR | Status: AC
  Filled 2014-05-29: qty 2

## 2014-05-29 MED ORDER — MIDAZOLAM HCL 5 MG/ML IJ SOLN
INTRAMUSCULAR | Status: AC
  Filled 2014-05-29: qty 2

## 2014-05-29 MED ORDER — FENTANYL CITRATE 0.05 MG/ML IJ SOLN
INTRAMUSCULAR | Status: DC | PRN
  Administered 2014-05-29 (×2): 12.5 ug via INTRAVENOUS

## 2014-05-29 MED ORDER — LIDOCAINE VISCOUS 2 % MT SOLN
OROMUCOSAL | Status: AC
  Filled 2014-05-29: qty 15

## 2014-05-29 NOTE — Telephone Encounter (Signed)
Message copied by Georgiann Mccoy on Mon May 29, 2014  9:26 AM ------      Message from: [place], [place] K      Created: Fri May 26, 2014  4:15 PM      Regarding: RE: follow up       [facility]'s Ed note 05-23-14 says prn. Kim's note mentions a possible fistulogram in the future but the [facility] will call us to schedule at hospital            ----- Message -----         From: Georgiann Mccoy         Sent: 05/26/2014   3:47 PM           To: Mena Goes, RN      Subject: follow up                                                Scherrie November,            Pt was seen in [place] on 05/18/14 for a pulsite fistula. Consult notes indicate a f/u in the office. In what time frame should I schedule this?            Thank you,      Selinda Eon       ------

## 2014-05-29 NOTE — Progress Notes (Signed)
Agree with above [name] [name]

## 2014-05-29 NOTE — Progress Notes (Signed)
    [facility] has been requested to perform a transesophageal echocardiogram on Mr Wilburt Messina for endocarditis.  After careful review of history and examination, the risks and benefits of transesophageal echocardiogram have been explained including risks of esophageal damage, perforation (1:10,000 risk), bleeding, pharyngeal hematoma as well as other potential complications associated with conscious sedation including aspiration, arrhythmia, respiratory failure and death. Alternatives to treatment were discussed, questions were answered. Patient is willing to proceed.   Erlene Quan, PA 05/29/2014 9:32 AM

## 2014-05-29 NOTE — Op Note (Signed)
No obvious vegetations LVH  LVEF is vigorous No PFO by color doppler or with injection of agitated saline Full reprot to follow

## 2014-05-29 NOTE — Progress Notes (Signed)
INFECTIOUS DISEASE PROGRESS NOTE  ID: [name] [name] is a 73 y.o. male with  Active Problems:   HCAP (healthcare-associated pneumonia)  Subjective: Resting quietly  Abtx:  Anti-infectives   Start     Dose/Rate Route Frequency Ordered Stop   05/30/14 1200  ceFAZolin (ANCEF) IVPB 2 g/50 mL premix     2 g 100 mL/hr over 30 Minutes Intravenous Every T-Th-Sa (Hemodialysis) 05/29/14 1045     05/27/14 1200  ceFAZolin (ANCEF) IVPB 2 g/50 mL premix     2 g 100 mL/hr over 30 Minutes Intravenous Every T-Th-Sa (Hemodialysis) 05/26/14 1559 05/27/14 1223   05/25/14 1800  ceFEPIme (MAXIPIME) 2 g in dextrose 5 % 50 mL IVPB  Status:  Discontinued     2 g 100 mL/hr over 30 Minutes Intravenous Every T-Th-Sa (1800) 05/24/14 1023 05/26/14 1434   05/25/14 1200  vancomycin (VANCOCIN) IVPB 1000 mg/200 mL premix  Status:  Discontinued     1,000 mg 200 mL/hr over 60 Minutes Intravenous Every T-Th-Sa (Hemodialysis) 05/23/14 1729 05/23/14 1752   05/25/14 1200  vancomycin (VANCOCIN) IVPB 1000 mg/200 mL premix  Status:  Discontinued     1,000 mg 200 mL/hr over 60 Minutes Intravenous Every T-Th-Sa (Hemodialysis) 05/23/14 1854 05/26/14 1434   05/25/14 1200  ceFEPIme (MAXIPIME) 2 g in dextrose 5 % 50 mL IVPB  Status:  Discontinued     2 g 100 mL/hr over 30 Minutes Intravenous Every T-Th-Sa (Hemodialysis) 05/23/14 1855 05/24/14 1023   05/23/14 2100  ceFEPIme (MAXIPIME) 2 g in dextrose 5 % 50 mL IVPB  Status:  Discontinued     2 g 100 mL/hr over 30 Minutes Intravenous Every T-Th-Sa (Hemodialysis) 05/23/14 1751 05/23/14 1855   05/23/14 2100  vancomycin (VANCOCIN) IVPB 1000 mg/200 mL premix  Status:  Discontinued     1,000 mg 200 mL/hr over 60 Minutes Intravenous Every T-Th-Sa (Hemodialysis) 05/23/14 1752 05/23/14 1854   05/23/14 1930  vancomycin (VANCOCIN) IVPB 1000 mg/200 mL premix  Status:  Discontinued     1,000 mg 200 mL/hr over 60 Minutes Intravenous To Hemodialysis 05/23/14 1854 05/23/14 1856   05/23/14 1930  ceFEPIme (MAXIPIME) 2 g in dextrose 5 % 50 mL IVPB     2 g 100 mL/hr over 30 Minutes Intravenous To Hemodialysis 05/23/14 1855 05/23/14 2318   05/23/14 1900  ceFEPIme (MAXIPIME) 2 g in dextrose 5 % 50 mL IVPB  Status:  Discontinued     2 g 100 mL/hr over 30 Minutes Intravenous Every T-Th-Sa (Hemodialysis) 05/23/14 1537 05/23/14 1751   05/23/14 1900  vancomycin (VANCOCIN) IVPB 1000 mg/200 mL premix  Status:  Discontinued     1,000 mg 200 mL/hr over 60 Minutes Intravenous Every T-Th-Sa (Hemodialysis) 05/23/14 1537 05/23/14 1729   05/23/14 1900  vancomycin (VANCOCIN) 2,500 mg in sodium chloride 0.9 % 500 mL IVPB     2,500 mg 250 mL/hr over 120 Minutes Intravenous To Hemodialysis 05/23/14 1856 05/24/14 0014   05/23/14 1830  vancomycin (VANCOCIN) 2,500 mg in sodium chloride 0.9 % 500 mL IVPB  Status:  Discontinued     2,500 mg 250 mL/hr over 120 Minutes Intravenous To Hemodialysis 05/23/14 1729 05/23/14 1853   05/23/14 1400  vancomycin (VANCOCIN) 2,500 mg in sodium chloride 0.9 % 500 mL IVPB     2,500 mg 250 mL/hr over 120 Minutes Intravenous  Once 05/23/14 1309 05/23/14 1530   05/23/14 1300  ceFEPIme (MAXIPIME) 1 g in dextrose 5 % 50 mL IVPB  Status:  Discontinued  1 g 100 mL/hr over 30 Minutes Intravenous  Once 05/23/14 1257 05/23/14 1535      Medications:  Scheduled: . aspirin EC  325 mg Oral Daily  . atorvastatin  20 mg Oral QHS  . carbidopa-levodopa  0.5 tablet Oral TID  . [START ON 05/30/2014]  ceFAZolin (ANCEF) IV  2 g Intravenous Q T,Th,Sa-HD  . darbepoetin (ARANESP) injection - DIALYSIS  60 mcg Intravenous Q Thu-HD  . divalproex  1,500 mg Oral Daily  . escitalopram  5 mg Oral Daily  . feeding supplement (NEPRO CARB STEADY)  237 mL Oral TID WC  . hydrocortisone   Rectal BID  . Influenza vac split quadrivalent PF  0.5 mL Intramuscular Tomorrow-1000  . LORazepam  1 mg Intravenous Once  . metoprolol succinate  12.5 mg Oral QHS  . multivitamin  1 tablet Oral QHS    . sevelamer carbonate  3,200 mg Oral TID WC  . sucroferric oxyhydroxide  500 mg Oral Q supper    Objective: Vital signs in last 24 hours: Temp:  [98.4 F (36.9 C)-100.2 F (37.9 C)] 98.5 F (36.9 C) (09/14 1312) Pulse Rate:  [65-90] 79 (09/14 1330) Resp:  [11-20] 11 (09/14 1330) BP: (61-123)/(12-61) 72/13 mmHg (09/14 1330) SpO2:  [92 %-100 %] 97 % (09/14 1330) Weight:  [114.7 kg (252 lb 13.9 oz)] 114.7 kg (252 lb 13.9 oz) (09/13 1946)   General appearance: no distress Resp: clear to auscultation bilaterally Cardio: regular rate and rhythm GI: normal findings: bowel sounds normal and soft, non-tender  Lab Results  Recent Labs  05/27/14 0500 05/27/14 0759 05/28/14 1640  WBC 11.3*  --  13.9*  HGB 8.4*  --  9.2*  HCT 25.2*  --  27.7*  NA  --  136* 134*  K  --  4.4 4.9  CL  --  93* 90*  CO2  --  26 26  BUN  --  69* 48*  CREATININE  --  8.37* 6.66*   Liver Panel  Recent Labs  05/27/14 0759  ALBUMIN 2.3*   Sedimentation Rate No results found for this basename: ESRSEDRATE,  in the last 72 hours C-Reactive Protein No results found for this basename: CRP,  in the last 72 hours  Microbiology: Recent Results (from the past 240 hour(s))  URINE CULTURE     Status: None   Collection Time    05/23/14 11:50 AM      Result Value Ref Range Status   Specimen Description URINE, RANDOM   Final   Special Requests NONE   Final   Culture  Setup Time     Final   Value: 05/23/2014 17:20     Performed at [facility]     Final   Value: >=100,000 COLONIES/ML     Performed at [facility]   Culture     Final   Value: ESCHERICHIA COLI     Performed at [facility]   Report Status 05/26/2014 FINAL   Final   Organism ID, Bacteria ESCHERICHIA COLI   Final  CULTURE, BLOOD (ROUTINE X 2)     Status: None   Collection Time    05/23/14  1:17 PM      Result Value Ref Range Status   Specimen Description BLOOD HAND RIGHT   Final   Special  Requests BOTTLES DRAWN AEROBIC ONLY 3CC   Final   Culture  Setup Time     Final   Value: 05/23/2014 16:42  Performed at [facility]     Final   Value: STAPHYLOCOCCUS AUREUS     Note: RIFAMPIN AND GENTAMICIN SHOULD NOT BE USED AS SINGLE DRUGS FOR TREATMENT OF STAPH INFECTIONS.     Note: Gram Stain Report Called to,Read Back By and Verified With: BONITA@12 :50PM ON 05/24/14 BY DANTS     Performed at [facility]   Report Status 05/26/2014 FINAL   Final   Organism ID, Bacteria STAPHYLOCOCCUS AUREUS   Final  CULTURE, BLOOD (ROUTINE X 2)     Status: None   Collection Time    05/23/14  2:15 PM      Result Value Ref Range Status   Specimen Description BLOOD RIGHT HAND   Final   Special Requests BOTTLES DRAWN AEROBIC AND ANAEROBIC 5CC   Final   Culture  Setup Time     Final   Value: 05/23/2014 18:52     Performed at [facility]   Culture     Final   Value: STAPHYLOCOCCUS AUREUS     Note: SUSCEPTIBILITIES PERFORMED ON PREVIOUS CULTURE WITHIN THE LAST 5 DAYS.     Note: Gram Stain Report Called to,Read Back By and Verified With: JULIA OSBORNE ON 05/24/2014 AT 8:52P BY WILEJ     Performed at [facility]   Report Status 05/26/2014 FINAL   Final  MRSA PCR SCREENING     Status: None   Collection Time    05/23/14  3:49 PM      Result Value Ref Range Status   MRSA by PCR NEGATIVE  NEGATIVE Final   Comment:            The GeneXpert MRSA Assay (FDA     approved for NASAL specimens     only), is one component of a     comprehensive MRSA colonization     surveillance program. It is not     intended to diagnose MRSA     infection nor to guide or     monitor treatment for     MRSA infections.  CULTURE, BLOOD (ROUTINE X 2)     Status: None   Collection Time    05/25/14  3:05 PM      Result Value Ref Range Status   Specimen Description BLOOD HEMODIALYSIS GRAFT AVF   Final   Special Requests BOTTLES DRAWN AEROBIC AND ANAEROBIC 10CC   Final   Culture   Setup Time     Final   Value: 05/25/2014 19:18     Performed at [facility]   Culture     Final   Value:        BLOOD CULTURE RECEIVED NO GROWTH TO DATE CULTURE WILL BE HELD FOR 5 DAYS BEFORE ISSUING A FINAL NEGATIVE REPORT     Performed at [facility]   Report Status PENDING   Incomplete  CULTURE, BLOOD (ROUTINE X 2)     Status: None   Collection Time    05/25/14  3:15 PM      Result Value Ref Range Status   Specimen Description HEMODIALYSIS GRAFT   Final   Special Requests BOTTLES DRAWN AEROBIC AND ANAEROBIC 10CC AVF   Final   Culture  Setup Time     Final   Value: 05/25/2014 19:22     Performed at [facility]   Culture     Final   Value:        BLOOD CULTURE RECEIVED NO  GROWTH TO DATE CULTURE WILL BE HELD FOR 5 DAYS BEFORE ISSUING A FINAL NEGATIVE REPORT     Performed at [facility]   Report Status PENDING   Incomplete    Studies/Results: No results found.   Assessment/Plan: Bacteremia/MSSA  E coli (pan-sens) in UCx  ESRD  Spinal Pain- hx of cervical spondylosis  R hip pain  Total days of antibiotics 7 (ancef)  TEE (-) for vegetation Repeat BCx 9-10 (-) Vascular graft revised 9-13 MRI R hip pending Slight increase in WBC, temp         [name] Infectious Diseases (pager) [phone] [email or web address] 05/29/2014, 3:34 PM  LOS: 6 days

## 2014-05-29 NOTE — Progress Notes (Signed)
PT Cancellation Note  Patient Details Name: [name] [name] MRN: [id number] DOB: March 06, 1941   Cancelled Treatment:    Reason Eval/Treat Not Completed: Patient at procedure or test/unavailable   Duncan Dull 05/29/2014, 12:32 PM Alben Deeds, Clint DPT  [phone]

## 2014-05-29 NOTE — Progress Notes (Signed)
Echocardiogram Echocardiogram Transesophageal has been performed.  [name] [name] 05/29/2014, 1:03 PM

## 2014-05-29 NOTE — Progress Notes (Signed)
   Daily Progress Note  Assessment/Planning: POD #1 s/p L arm fistulogram, PTA cephalic vein, repair of L BC AVF   Ok to use fistula proximal to the repair  Sutures out in the office in 2 weeks  Subjective  - 1 Day Post-Op  No complaints  Objective Filed Vitals:   05/28/14 1731 05/28/14 1946 05/28/14 1950 05/29/14 0527  BP: 123/48  116/61 114/61  Pulse: 81  90 65  Temp: 98.6 F (37 C)  100.2 F (37.9 C) 98.9 F (37.2 C)  TempSrc: Oral     Resp: 18  18 18   Height:      Weight:  252 lb 13.9 oz (114.7 kg)    SpO2: 98%  92% 97%    Intake/Output Summary (Last 24 hours) at 05/29/14 0839 Last data filed at 05/29/14 0600  Gross per 24 hour  Intake    460 ml  Output    250 ml  Net    210 ml   VASC  Palpable thrill with bruit in L upper arm, L arm bandaged without any active bleeding  Laboratory CBC    Component Value Date/Time   WBC 13.9* 05/28/2014 1640   WBC 8.0 02/09/2013 1350   HGB 9.2* 05/28/2014 1640   HCT 27.7* 05/28/2014 1640   PLT 389 05/28/2014 1640    BMET    Component Value Date/Time   NA 134* 05/28/2014 1640   NA 138 02/09/2013 1350   K 4.9 05/28/2014 1640   CL 90* 05/28/2014 1640   CO2 26 05/28/2014 1640   GLUCOSE 149* 05/28/2014 1640   GLUCOSE 137* 02/09/2013 1350   BUN 48* 05/28/2014 1640   BUN 25 02/09/2013 1350   CREATININE 6.66* 05/28/2014 1640   CALCIUM 9.6 05/28/2014 1640   GFRNONAA 7* 05/28/2014 1640   GFRAA 8* 05/28/2014 1640    Adele Barthel, MD [facility]: [phone] Pager: [phone]  05/29/2014, 8:39 AM

## 2014-05-29 NOTE — Progress Notes (Addendum)
Patient ID: [name] [name], male   DOB: November 06, 1940, 74 y.o.   MRN: [id number]  [facility] Progress Note    Subjective:   Feels much better   Objective:   BP 89/51  Pulse 72  Temp(Src) 98.8 F (37.1 C) (Oral)  Resp 18  Ht 5' 8.9" (1.75 m)  Wt 114.7 kg (252 lb 13.9 oz)  BMI 37.45 kg/m2  SpO2 96%  Intake/Output: I/O last 3 completed shifts: In: 61 [P.O.:330; I.V.:250; IV Piggyback:250] Out: 250 [Urine:200; Blood:50]   Intake/Output this shift:    Weight change: -6.1 kg (-13 lb 7.2 oz)  Physical Exam: Gen:WD WN AAM in NAD CVS:no rub Resp:cta YTK:ZSWFUX Ext:s/p RBKA, LUE AVF +T/B s/p revision by Dr. Bridgett Larsson 05/28/14  Labs: BMET  Recent Labs Lab 05/23/14 1156 05/25/14 1249 05/26/14 1239 05/27/14 0759 05/28/14 1640  NA 136* 138 135* 136* 134*  K 5.3 5.3 4.8 4.4 4.9  CL 90* 92* 91* 93* 90*  CO2 22 24 25 26 26   GLUCOSE 180* 199* 166* 155* 149*  BUN 94* 79* 48* 69* 48*  CREATININE 12.63* 10.50* 7.03* 8.37* 6.66*  ALBUMIN 2.9*  --   --  2.3*  --   CALCIUM 10.1 10.1 10.0 9.6 9.6  PHOS  --   --   --  6.1*  --    CBC  Recent Labs Lab 05/25/14 1249 05/26/14 1239 05/27/14 0500 05/28/14 1640  WBC 17.7* 12.3* 11.3* 13.9*  HGB 10.0* 9.2* 8.4* 9.2*  HCT 30.0* 27.7* 25.2* 27.7*  MCV 100.3* 99.3 101.2* 98.9  PLT 201 331 279 389    @IMGRELPRIORS @ Medications:    . aspirin EC  325 mg Oral Daily  . atorvastatin  20 mg Oral QHS  . carbidopa-levodopa  0.5 tablet Oral TID  . darbepoetin (ARANESP) injection - DIALYSIS  60 mcg Intravenous Q Thu-HD  . divalproex  1,500 mg Oral Daily  . escitalopram  5 mg Oral Daily  . feeding supplement (NEPRO CARB STEADY)  237 mL Oral TID WC  . hydrocortisone   Rectal BID  . Influenza vac split quadrivalent PF  0.5 mL Intramuscular Tomorrow-1000  . LORazepam  1 mg Intravenous Once  . metoprolol succinate  12.5 mg Oral QHS  . multivitamin  1 tablet Oral QHS  . sevelamer carbonate  3,200 mg Oral TID WC  . sucroferric  oxyhydroxide  500 mg Oral Q supper    [facility]: [facility]  4 hr 30 mins 118kg 2K/2Ca+ 350/8000 6000 Heparin L AVF  hectorol 4 mcg Aranesp 50 q week No Fe   Assessment/ Plan:    1. AMS- improving, due to infection  2. PNA- R infiltrate on xray off Cefepime and Vanc - changed to Ancef - but DC'd no current antibx. Blood cultures- staph aureus. Afebrile.  3. UTI- culture - Ecoli. antibx per pharm. Has had AMS in the past (may 2015) secondary to UTI, +Ecoli and was treated with Rocephin- has condom cath 4. MSSA bacteremia- on Ancef ID evaluating, he did have a stitch abcess on the AVF the day of admission which drained a small amt of pus. for TEE Monday  5. Tremor- Head CT- no acute event on 9/4. Follows with neurology, last appt 8/31- continue divalproex for seizure and carb/levo for tremor  6. ESRD - TTS @ [facility].next HD tomorrow 7. Hypertension/volume - . 130/50 Under edw 8. Anemia -hgb 10> 9.2 > 8.4 . ESA q Thursday, missed last weeks dose- given 9/8. No Fe, last  tsat 28.- repeat CBC tomorrow with HD. 9. Metabolic bone disease - ^ corr Ca P 6.1. Hold hectorol. 2 Ca+ bath. - last phos 8.9 (8/27) PTH 391 (7/23) Cont binders - renvela and velphoro 10. Nutrition - alb 2.3. Multi vit renal diet.  11. AVF bleeding- s/p fistulogram and revision 9.13 with dr Bridgett Larsson Tissue defect in AVF repaired by Dr Bridgett Larsson, and also had PTA of venous stenoses upstream. Appreciate VVS assistance and per Dr. Bridgett Larsson, ok to stick proximally to revision. 12. Spinal stenosis 13. Dispo- back to SNF when stable per primary svc Tanylah Schnoebelen A 05/29/2014, 9:44 AM

## 2014-05-29 NOTE — Interval H&P Note (Signed)
History and Physical Interval Note:  05/29/2014 12:12 PM  [name] [name]  has presented today for surgery, with the diagnosis of r/o endocarditis  The various methods of treatment have been discussed with the patient and family. After consideration of risks, benefits and other options for treatment, the patient has consented to  Procedure(s): TRANSESOPHAGEAL ECHOCARDIOGRAM (TEE) (N/A) as a surgical intervention .  The patient's history has been reviewed, patient examined, no change in status, stable for surgery.  I have reviewed the patient's chart and labs.  Questions were answered to the patient's satisfaction.     Dorris Carnes

## 2014-05-29 NOTE — Progress Notes (Signed)
[facility] PROGRESS NOTE  [name] [name] ZOX:[id number] DOB: 15-Apr-1941 DOA: 05/23/2014 PCP: Donetta Potts, MD  Assessment/Plan: MSSA bacteremia  - likely due to HCAP/PNA and or stitch abscesses on graft - LUE AVF with stitch abscesses on graft, VVS aware, s/p revision 9/13, with fistulogram and repair  - continue Ancef per ID with HD, pharmacy to dose, start date 9/11 - repeat Blood CX from 9/10 NGTD - MRI R hip per ID recs pending - TEE today to r/o endocarditis  Ecoli UTI - pan sensitive, > 100K colonies - completed 4days of cefepime, now on ancef  Metabolic encephalopathy -due to 1,2, gabapentin, narcotics -Gabapentin and percocet held -improved  Spinal stenosis and cervical spondylosis -FU with Dr.Elsner -Narcotics were on hold due to mentation -resumed low dose due to severe chronic pain  Seizure d/o -continue Depakote, level slightly low  Tremors -continue carb/levo   ESRD  -HD per Renal  Hypertension/volume  -stable, continue TOprol  Anemia of chronic disease -Aranesp with HD   AVF bleeding  -resolved -per Renal/VVS  DVt proph: lovenox  Code Status: Full Code Family Communication: no family at bedside, attempted to reach wife [name] x2 @ [phone], finally was able to contact wife 9/11; Disposition Plan: SNF tomorrow pending workup if stable   Consultants:  Renal  Antibiotics:  Cefepime/Vanc 9/8 -9/11  Ancef 9/11  HPI/Subjective: Just back from PACU, some bleeding from AVF site  Objective: Filed Vitals:   05/29/14 0919  BP: 89/51  Pulse: 72  Temp: 98.8 F (37.1 C)  Resp: 18    Intake/Output Summary (Last 24 hours) at 05/29/14 1052 Last data filed at 05/29/14 0600  Gross per 24 hour  Intake    210 ml  Output    200 ml  Net     10 ml   Filed Weights   05/27/14 0745 05/27/14 1947 05/28/14 1946  Weight: 120.8 kg (266 lb 5.1 oz) 113.1 kg (249 lb 5.4 oz) 114.7 kg (252 lb 13.9 oz)    Exam:   General:  Awake,  more alert, oriented to self and place, less confused  Cardiovascular: S1S2/RRR  Respiratory: poor air movt  Abdomen: soft, Nt, BS present  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, L BKA  NEuro: moves all ext, no localizing signs   Data Reviewed: Basic Metabolic Panel:  Recent Labs Lab 05/23/14 1156 05/25/14 1249 05/26/14 1239 05/27/14 0759 05/28/14 1640  NA 136* 138 135* 136* 134*  K 5.3 5.3 4.8 4.4 4.9  CL 90* 92* 91* 93* 90*  CO2 22 24 25 26 26   GLUCOSE 180* 199* 166* 155* 149*  BUN 94* 79* 48* 69* 48*  CREATININE 12.63* 10.50* 7.03* 8.37* 6.66*  CALCIUM 10.1 10.1 10.0 9.6 9.6  PHOS  --   --   --  6.1*  --    Liver Function Tests:  Recent Labs Lab 05/23/14 1156 05/27/14 0759  AST 12  --   ALT <5  --   ALKPHOS 66  --   BILITOT 0.2*  --   PROT 7.7  --   ALBUMIN 2.9* 2.3*   No results found for this basename: LIPASE, AMYLASE,  in the last 168 hours No results found for this basename: AMMONIA,  in the last 168 hours CBC:  Recent Labs Lab 05/23/14 1156 05/25/14 1249 05/26/14 1239 05/27/14 0500 05/28/14 1640  WBC 16.1* 17.7* 12.3* 11.3* 13.9*  HGB 9.0* 10.0* 9.2* 8.4* 9.2*  HCT 27.3* 30.0* 27.7* 25.2* 27.7*  MCV 97.8 100.3* 99.3 101.2* 98.9  PLT 309 201 331 279 389   Cardiac Enzymes: No results found for this basename: CKTOTAL, CKMB, CKMBINDEX, TROPONINI,  in the last 168 hours BNP (last 3 results)  Recent Labs  01/15/14 1316  PROBNP 1399.0*   CBG:  Recent Labs Lab 05/28/14 1130  GLUCAP 139*    Recent Results (from the past 240 hour(s))  URINE CULTURE     Status: None   Collection Time    05/23/14 11:50 AM      Result Value Ref Range Status   Specimen Description URINE, RANDOM   Final   Special Requests NONE   Final   Culture  Setup Time     Final   Value: 05/23/2014 17:20     Performed at [facility]     Final   Value: >=100,000 COLONIES/ML     Performed at [facility]   Culture     Final   Value:  ESCHERICHIA COLI     Performed at [facility]   Report Status 05/26/2014 FINAL   Final   Organism ID, Bacteria ESCHERICHIA COLI   Final  CULTURE, BLOOD (ROUTINE X 2)     Status: None   Collection Time    05/23/14  1:17 PM      Result Value Ref Range Status   Specimen Description BLOOD HAND RIGHT   Final   Special Requests BOTTLES DRAWN AEROBIC ONLY 3CC   Final   Culture  Setup Time     Final   Value: 05/23/2014 16:42     Performed at [facility]   Culture     Final   Value: STAPHYLOCOCCUS AUREUS     Note: RIFAMPIN AND GENTAMICIN SHOULD NOT BE USED AS SINGLE DRUGS FOR TREATMENT OF STAPH INFECTIONS.     Note: Gram Stain Report Called to,Read Back By and Verified With: BONITA@12 :50PM ON 05/24/14 BY DANTS     Performed at [facility]   Report Status 05/26/2014 FINAL   Final   Organism ID, Bacteria STAPHYLOCOCCUS AUREUS   Final  CULTURE, BLOOD (ROUTINE X 2)     Status: None   Collection Time    05/23/14  2:15 PM      Result Value Ref Range Status   Specimen Description BLOOD RIGHT HAND   Final   Special Requests BOTTLES DRAWN AEROBIC AND ANAEROBIC 5CC   Final   Culture  Setup Time     Final   Value: 05/23/2014 18:52     Performed at [facility]   Culture     Final   Value: STAPHYLOCOCCUS AUREUS     Note: SUSCEPTIBILITIES PERFORMED ON PREVIOUS CULTURE WITHIN THE LAST 5 DAYS.     Note: Gram Stain Report Called to,Read Back By and Verified With: JULIA OSBORNE ON 05/24/2014 AT 8:52P BY WILEJ     Performed at [facility]   Report Status 05/26/2014 FINAL   Final  MRSA PCR SCREENING     Status: None   Collection Time    05/23/14  3:49 PM      Result Value Ref Range Status   MRSA by PCR NEGATIVE  NEGATIVE Final   Comment:            The GeneXpert MRSA Assay (FDA     approved for NASAL specimens     only), is one component of a     comprehensive MRSA colonization     surveillance program. It is  not     intended to diagnose MRSA     infection  nor to guide or     monitor treatment for     MRSA infections.  CULTURE, BLOOD (ROUTINE X 2)     Status: None   Collection Time    05/25/14  3:05 PM      Result Value Ref Range Status   Specimen Description BLOOD HEMODIALYSIS GRAFT AVF   Final   Special Requests BOTTLES DRAWN AEROBIC AND ANAEROBIC 10CC   Final   Culture  Setup Time     Final   Value: 05/25/2014 19:18     Performed at [facility]   Culture     Final   Value:        BLOOD CULTURE RECEIVED NO GROWTH TO DATE CULTURE WILL BE HELD FOR 5 DAYS BEFORE ISSUING A FINAL NEGATIVE REPORT     Performed at [facility]   Report Status PENDING   Incomplete  CULTURE, BLOOD (ROUTINE X 2)     Status: None   Collection Time    05/25/14  3:15 PM      Result Value Ref Range Status   Specimen Description HEMODIALYSIS GRAFT   Final   Special Requests BOTTLES DRAWN AEROBIC AND ANAEROBIC 10CC AVF   Final   Culture  Setup Time     Final   Value: 05/25/2014 19:22     Performed at [facility]   Culture     Final   Value:        BLOOD CULTURE RECEIVED NO GROWTH TO DATE CULTURE WILL BE HELD FOR 5 DAYS BEFORE ISSUING A FINAL NEGATIVE REPORT     Performed at [facility]   Report Status PENDING   Incomplete     Studies: No results found.  Scheduled Meds: . aspirin EC  325 mg Oral Daily  . atorvastatin  20 mg Oral QHS  . carbidopa-levodopa  0.5 tablet Oral TID  . [START ON 05/30/2014]  ceFAZolin (ANCEF) IV  2 g Intravenous Q T,Th,Sa-HD  . darbepoetin (ARANESP) injection - DIALYSIS  60 mcg Intravenous Q Thu-HD  . divalproex  1,500 mg Oral Daily  . escitalopram  5 mg Oral Daily  . feeding supplement (NEPRO CARB STEADY)  237 mL Oral TID WC  . hydrocortisone   Rectal BID  . Influenza vac split quadrivalent PF  0.5 mL Intramuscular Tomorrow-1000  . LORazepam  1 mg Intravenous Once  . metoprolol succinate  12.5 mg Oral QHS  . multivitamin  1 tablet Oral QHS  . sevelamer carbonate  3,200 mg Oral TID WC  .  sucroferric oxyhydroxide  500 mg Oral Q supper   Continuous Infusions:  Antibiotics Given (last 72 hours)   Date/Time Action Medication Dose Rate   05/27/14 1153 Given   ceFAZolin (ANCEF) IVPB 2 g/50 mL premix 2 g 100 mL/hr      Active Problems:   HCAP (healthcare-associated pneumonia)    Time spent: 73min    Yoltzin Ransom  [facility] Pager [phone]. If 7PM-7AM, please contact night-coverage at [email or web address], password [facility] 05/29/2014, 10:52 AM  LOS: 6 days

## 2014-05-29 NOTE — H&P (View-Only) (Signed)
Patient ID: [name] [name], male   DOB: 10/02/1940, 73 y.o.   MRN: [id number]  [facility] Progress Note    Subjective:   Feels much better   Objective:   BP 89/51  Pulse 72  Temp(Src) 98.8 F (37.1 C) (Oral)  Resp 18  Ht 5' 8.9" (1.75 m)  Wt 114.7 kg (252 lb 13.9 oz)  BMI 37.45 kg/m2  SpO2 96%  Intake/Output: I/O last 3 completed shifts: In: 86 [P.O.:330; I.V.:250; IV Piggyback:250] Out: 250 [Urine:200; Blood:50]   Intake/Output this shift:    Weight change: -6.1 kg (-13 lb 7.2 oz)  Physical Exam: Gen:WD WN AAM in NAD CVS:no rub Resp:cta WGY:KZLDJT Ext:s/p RBKA, LUE AVF +T/B s/p revision by Dr. Bridgett Larsson 05/28/14  Labs: BMET  Recent Labs Lab 05/23/14 1156 05/25/14 1249 05/26/14 1239 05/27/14 0759 05/28/14 1640  NA 136* 138 135* 136* 134*  K 5.3 5.3 4.8 4.4 4.9  CL 90* 92* 91* 93* 90*  CO2 22 24 25 26 26   GLUCOSE 180* 199* 166* 155* 149*  BUN 94* 79* 48* 69* 48*  CREATININE 12.63* 10.50* 7.03* 8.37* 6.66*  ALBUMIN 2.9*  --   --  2.3*  --   CALCIUM 10.1 10.1 10.0 9.6 9.6  PHOS  --   --   --  6.1*  --    CBC  Recent Labs Lab 05/25/14 1249 05/26/14 1239 05/27/14 0500 05/28/14 1640  WBC 17.7* 12.3* 11.3* 13.9*  HGB 10.0* 9.2* 8.4* 9.2*  HCT 30.0* 27.7* 25.2* 27.7*  MCV 100.3* 99.3 101.2* 98.9  PLT 201 331 279 389    @IMGRELPRIORS @ Medications:    . aspirin EC  325 mg Oral Daily  . atorvastatin  20 mg Oral QHS  . carbidopa-levodopa  0.5 tablet Oral TID  . darbepoetin (ARANESP) injection - DIALYSIS  60 mcg Intravenous Q Thu-HD  . divalproex  1,500 mg Oral Daily  . escitalopram  5 mg Oral Daily  . feeding supplement (NEPRO CARB STEADY)  237 mL Oral TID WC  . hydrocortisone   Rectal BID  . Influenza vac split quadrivalent PF  0.5 mL Intramuscular Tomorrow-1000  . LORazepam  1 mg Intravenous Once  . metoprolol succinate  12.5 mg Oral QHS  . multivitamin  1 tablet Oral QHS  . sevelamer carbonate  3,200 mg Oral TID WC  . sucroferric  oxyhydroxide  500 mg Oral Q supper    [facility]: [facility]  4 hr 30 mins 118kg 2K/2Ca+ 350/8000 6000 Heparin L AVF  hectorol 4 mcg Aranesp 50 q week No Fe   Assessment/ Plan:    1. AMS- improving, due to infection  2. PNA- R infiltrate on xray off Cefepime and Vanc - changed to Ancef - but DC'd no current antibx. Blood cultures- staph aureus. Afebrile.  3. UTI- culture - Ecoli. antibx per pharm. Has had AMS in the past (may 2015) secondary to UTI, +Ecoli and was treated with Rocephin- has condom cath 4. MSSA bacteremia- on Ancef ID evaluating, he did have a stitch abcess on the AVF the day of admission which drained a small amt of pus. for TEE Monday  5. Tremor- Head CT- no acute event on 9/4. Follows with neurology, last appt 8/31- continue divalproex for seizure and carb/levo for tremor  6. ESRD - TTS @ [facility].next HD tomorrow 7. Hypertension/volume - . 130/50 Under edw 8. Anemia -hgb 10> 9.2 > 8.4 . ESA q Thursday, missed last weeks dose- given 9/8. No Fe, last  tsat 28.- repeat CBC tomorrow with HD. 9. Metabolic bone disease - ^ corr Ca P 6.1. Hold hectorol. 2 Ca+ bath. - last phos 8.9 (8/27) PTH 391 (7/23) Cont binders - renvela and velphoro 10. Nutrition - alb 2.3. Multi vit renal diet.  11. AVF bleeding- s/p fistulogram and revision 9.13 with dr Bridgett Larsson Tissue defect in AVF repaired by Dr Bridgett Larsson, and also had PTA of venous stenoses upstream. Appreciate VVS assistance and per Dr. Bridgett Larsson, ok to stick proximally to revision. 12. Spinal stenosis 13. Dispo- back to SNF when stable per primary svc Abe Schools A 05/29/2014, 9:44 AM

## 2014-05-29 NOTE — Progress Notes (Signed)
OT Cancellation Note  Patient Details Name: [name] [name] MRN: [id number] DOB: [birth year]   Cancelled Treatment:    Reason Eval/Treat Not Completed: Patient at procedure or test/ unavailable  [name] 05/29/2014, 11:25 AM Lesle Chris, OTR/L [phone] 05/29/2014

## 2014-05-30 ENCOUNTER — Encounter (HOSPITAL_COMMUNITY): Payer: Self-pay | Admitting: Internal Medicine

## 2014-05-30 DIAGNOSIS — E118 Type 2 diabetes mellitus with unspecified complications: Secondary | ICD-10-CM

## 2014-05-30 LAB — CBC
HCT: 24.4 % — ABNORMAL LOW (ref 39.0–52.0)
Hemoglobin: 8.3 g/dL — ABNORMAL LOW (ref 13.0–17.0)
MCH: 33.1 pg (ref 26.0–34.0)
MCHC: 34 g/dL (ref 30.0–36.0)
MCV: 97.2 fL (ref 78.0–100.0)
Platelets: 338 10*3/uL (ref 150–400)
RBC: 2.51 MIL/uL — AB (ref 4.22–5.81)
RDW: 14 % (ref 11.5–15.5)
WBC: 12.9 10*3/uL — ABNORMAL HIGH (ref 4.0–10.5)

## 2014-05-30 MED ORDER — CEFAZOLIN SODIUM-DEXTROSE 2-3 GM-% IV SOLR
2.0000 g | INTRAVENOUS | Status: DC
  Administered 2014-05-30: 2 g via INTRAVENOUS
  Filled 2014-05-30 (×2): qty 50

## 2014-05-30 MED ORDER — ENOXAPARIN SODIUM 30 MG/0.3ML ~~LOC~~ SOLN
30.0000 mg | SUBCUTANEOUS | Status: DC
  Administered 2014-05-30 – 2014-06-01 (×3): 30 mg via SUBCUTANEOUS
  Filled 2014-05-30 (×3): qty 0.3

## 2014-05-30 NOTE — Evaluation (Signed)
Physical Therapy Evaluation Patient Details Name: [name] [name] MRN: [id number] DOB: Oct 05, 1940 Today's Date: 05/30/2014   History of Present Illness  Patient is a 73 y/o male with history of ESRD ON HD and L BKA was brought in for confusion. On arrival to ED, pt was found to have right sided pneumonia and a UTI. S/p Left arm fistulogram and revision 9/13. TEE (-) for vegetation. MRI planned for hip.   Clinical Impression  Patient presents with functional limitations due to deficits listed in PT problem list (see below). Pt lethargic and demonstrating lack of initiation/effort with all mobility during PT evaluation. Unsure of pt's cognitive or functional baseline as reported PLOF/history is unclear. Pt distracted by neck pain today and requesting to return to supine throughout session. Not able/willing to donn prosthesis. Transfers and gait TBA as appropriate/allowed. Pt would benefit from acute PT and follow up ST SNF, if willing to participate, to improve transfers, gait and overall mobility so pt can maximize independence, minimize fall risk and ease burden of care prior to returning home.    Follow Up Recommendations SNF;Supervision/Assistance - 24 hour    Equipment Recommendations  Other (comment) (defer to SNF)    Recommendations for Other Services       Precautions / Restrictions Precautions Precautions: Fall Precaution Comments: Left prosthesis. Restrictions Weight Bearing Restrictions: No      Mobility  Bed Mobility Overal bed mobility: Needs Assistance Bed Mobility: Sit to Supine       Sit to supine: Total assist;+2 for physical assistance   General bed mobility comments: Even with cues to lift BLEs into bed, pt with lack of initiation/effort. Requires total A of 2 to safely return to supine and for repositioning. VC's to grab onto rails to assist with scooting up in bed with bed in trendelenberg however pt closes eyes and does not assist.   Transfers Overall  transfer level: Needs assistance Equipment used: Rolling walker (2 wheeled) Transfers: Sit to/from Stand           General transfer comment: Attempted to get patient to stand however pt unwilling to make an effort to donn prosthesis- stump placed in prosthesis but not secure. Therapist attempted to assist but pt complained of pain. Pt declining all transfers and requesting to return to supine. Lethargic.  Ambulation/Gait             General Gait Details: NA  Stairs            Wheelchair Mobility    Modified Rankin (Stroke Patients Only)       Balance Overall balance assessment: Needs assistance   Sitting balance-Leahy Scale: Fair Sitting balance - Comments: Able to sit EOB unsupported with occasional leaning to left when tired. Postural control: Left lateral lean     Standing balance comment: NA                             Pertinent Vitals/Pain Pain Assessment: 0-10 Pain Score:  (not rated on pain scale) Pain Location: neck Pain Descriptors / Indicators: Sore;Sharp;Aching Pain Intervention(s): Limited activity within patient's tolerance;Monitored during session;Repositioned    Home Living Family/patient expects to be discharged to:: Skilled nursing facility                      Prior Function Level of Independence: Needs assistance         Comments: Unsure of accuracy of reported PLOF/social history as pt  with difficulty focusing on questions asked and staying on topic. Reports he has not been ambulating for 3 weeks however when asked again, reports "I use my rollator to walk." Per OT, pt reports working with therapists PTA.  Not able to gain a clear picture of PLOF.     Hand Dominance        Extremity/Trunk Assessment   Upper Extremity Assessment: Defer to OT evaluation           Lower Extremity Assessment: Difficult to assess due to impaired cognition;Generalized weakness;LLE deficits/detail (Difficult to assess as pt  showing lack of effort/participation throughout evaluation. Seems overall weak vs no effort?)   LLE Deficits / Details: Pt observed lifting LLE out of prosthesis. Other testing not performed as pt declining any participation.     Communication   Communication: No difficulties  Cognition Arousal/Alertness: Lethargic Behavior During Therapy: Flat affect Overall Cognitive Status: No family/caregiver present to determine baseline cognitive functioning Area of Impairment: Safety/judgement;Problem solving;Following commands       Following Commands: Follows one step commands inconsistently Safety/Judgement: Decreased awareness of safety;Decreased awareness of deficits   Problem Solving: Slow processing;Decreased initiation;Difficulty sequencing;Requires verbal cues;Requires tactile cues General Comments: Pt with difficulty attending to task, lack of initation and lack of effort/participation throughout evaluation. Requires constant cues to stay alert as pt drowsy and lethargic throughout with eyes closing while sitting EOB. Persevating on neck pain and wanting to lay down.     General Comments      Exercises        Assessment/Plan    PT Assessment Patient needs continued PT services  PT Diagnosis Generalized weakness;Acute pain   PT Problem List Decreased strength;Pain;Decreased activity tolerance;Decreased balance;Decreased mobility;Decreased safety awareness  PT Treatment Interventions DME instruction;Balance training;Gait training;Patient/family education;Functional mobility training;Therapeutic activities;Therapeutic exercise;Wheelchair mobility training;Cognitive remediation   PT Goals (Current goals can be found in the Care Plan section) Acute Rehab PT Goals Patient Stated Goal: unable/unwilling to participate in goal setting PT Goal Formulation: Patient unable to participate in goal setting Time For Goal Achievement: 06/13/14    Frequency Min 2X/week   Barriers to  discharge        Co-evaluation               End of Session   Activity Tolerance: Patient limited by lethargy;Patient limited by pain (lack of effort/participation?) Patient left: in bed;with call bell/phone within reach;with bed alarm set Nurse Communication: Mobility status;Need for lift equipment         Time: 0938-1829 PT Time Calculation (min): 13 min   Charges:   PT Evaluation $Initial PT Evaluation Tier I: 1 Procedure     PT G CodesCandy Sledge A 05/30/2014, 5:23 PM Candy Sledge, [place], DPT [phone]

## 2014-05-30 NOTE — Evaluation (Signed)
Occupational Therapy Evaluation Patient Details Name: [name] [name] MRN: [id number] DOB: 07-26-1941 Today's Date: 05/30/2014    History of Present Illness Patient is a 73 y/o male with history of ESRD ON HD and L BKA was brought in for confusion. On arrival to ED, pt was found to have right sided pneumonia and a UTI. S/p Left arm fistulogram and revision 9/13. TEE (-) for vegetation. MRI planned for hip.   Clinical Impression   Pt admitted with the above,  He demonstrates impaired cognition - he was lethargic during eval, and required max cues to sustain attention to simple tasks    He requires max - total A with all BADL, and was unable to stand   He will benefit from continued OT to maximize safety and independence with BADLs   Recommend SNF level rehab at discharge    Follow Up Recommendations  SNF    Equipment Recommendations  None recommended by OT    Recommendations for Other Services       Precautions / Restrictions Precautions Precautions: Fall Precaution Comments: Left prosthesis. Restrictions Weight Bearing Restrictions: No      Mobility Bed Mobility Overal bed mobility: Needs Assistance Bed Mobility: Supine to Sit;Sit to Supine     Supine to sit: Max assist Sit to supine: Total assist;+2 for physical assistance   General bed mobility comments: Pt able to assist with moving LEs off bed and max A to lift trunk   Even with cues to lift BLEs into bed, pt with lack of initiation/effort. Requires total A of 2 to safely return to supine and for repositioning. VC's to grab onto rails to assist with scooting up in bed with bed in trendelenberg however pt closes eyes and does not assist.   Transfers Overall transfer level: Needs assistance Equipment used: Rolling walker (2 wheeled) Transfers: Sit to/from Stand           General transfer comment: Attempted to get patient to stand however pt unwilling to make an effort to donn prosthesis- stump placed in prosthesis  but not secure. Therapist attempted to assist but pt complained of pain. Pt declining all transfers and requesting to return to supine. Lethargic.    Balance Overall balance assessment: Needs assistance   Sitting balance-Leahy Scale: Fair Sitting balance - Comments: Able to sit EOB unsupported with occasional leaning to left when tired. Postural control: Left lateral lean     Standing balance comment: NA                            ADL Overall ADL's : Needs assistance/impaired Eating/Feeding: Set up   Grooming: Wash/dry hands;Wash/dry face;Minimal assistance;Sitting Grooming Details (indicate cue type and reason): due to attentional deficits Upper Body Bathing: Maximal assistance;Sitting   Lower Body Bathing: Total assistance;Bed level   Upper Body Dressing : Maximal assistance;Sitting   Lower Body Dressing: Total assistance Lower Body Dressing Details (indicate cue type and reason): Pt required max A to don sleeve for prosthesis   Unable to don prosthesis due to cognitive deficits Toilet Transfer: Total assistance Toilet Transfer Details (indicate cue type and reason): unable Toileting- Clothing Manipulation and Hygiene: Total assistance;Bed level       Functional mobility during ADLs: Maximal assistance (bed mobililty only ) General ADL Comments: Pt limited by cognitive deficits - unable to sustain attention   Unable to move pt into standing position due to poor attention resulting in no effort from pt  Vision                     Perception     Praxis      Pertinent Vitals/Pain Pain Assessment: Faces Pain Score:  (not rated on pain scale) Faces Pain Scale: Hurts little more Pain Location: neck  Pain Descriptors / Indicators: Aching;Sore Pain Intervention(s): Limited activity within patient's tolerance;Monitored during session;Repositioned     Hand Dominance Right   Extremity/Trunk Assessment Upper Extremity Assessment Upper Extremity  Assessment: LUE deficits/detail;RUE deficits/detail RUE Deficits / Details: Rt shoulder appears limited to ~100-110* shoulder elevation   Tremor noted RUE Coordination: decreased fine motor LUE Deficits / Details: Pt with limited shoulder flexion which pt inicates is long standing - questionable due to brain tumor,   Tremor noted Lt UE LUE Coordination: decreased fine motor;decreased gross motor   Lower Extremity Assessment Lower Extremity Assessment: Defer to PT evaluation LLE Deficits / Details: Pt observed lifting LLE out of prosthesis. Other testing not performed as pt declining any participation.   Cervical / Trunk Assessment Cervical / Trunk Assessment: Other exceptions Cervical / Trunk Exceptions: flexed posture  Complaint of neck pain    Communication Communication Communication: No difficulties   Cognition Arousal/Alertness: Lethargic Behavior During Therapy: Flat affect Overall Cognitive Status: Impaired/Different from baseline Area of Impairment: Orientation;Attention;Memory;Following commands;Safety/judgement;Awareness;Problem solving Orientation Level: Disoriented to;Time;Situation;Place Current Attention Level: Focused;Sustained (varied between focused and sustained) Memory: Decreased short-term memory Following Commands: Follows one step commands inconsistently (requires instructions to be repeated) Safety/Judgement: Decreased awareness of safety;Decreased awareness of deficits   Problem Solving: Slow processing;Decreased initiation;Difficulty sequencing;Requires verbal cues;Requires tactile cues General Comments: Pt with poor attention,  Maintains focused to sustained attention ,  Pt will initiate a task or movement, but then loses attention requiring cues to reinitiat   General Comments       Exercises       Shoulder Instructions      Home Living Family/patient expects to be discharged to:: Skilled nursing facility                                         Prior Functioning/Environment Level of Independence: Needs assistance  Gait / Transfers Assistance Needed: Pt states he ambulates with rollator at ALF ADL's / Homemaking Assistance Needed: Pt reports he was independent with BADLs at ALF   Comments: Unsure of accuracy of reported PLOF/social history as pt with difficulty focusing on questions asked and staying on topic. Reports he has not been ambulating for 3 weeks however when asked again, reports "I use my rollator to walk." Per OT, pt reports working with therapists PTA.  Not able to gain a clear picture of PLOF.    OT Diagnosis: Generalized weakness;Cognitive deficits;Acute pain   OT Problem List: Decreased strength;Decreased activity tolerance;Impaired balance (sitting and/or standing);Decreased coordination;Decreased cognition;Decreased safety awareness;Decreased knowledge of use of DME or AE;Pain;Impaired UE functional use   OT Treatment/Interventions: Self-care/ADL training;Therapeutic exercise;Neuromuscular education;DME and/or AE instruction;Therapeutic activities;Cognitive remediation/compensation;Visual/perceptual remediation/compensation;Patient/family education;Balance training    OT Goals(Current goals can be found in the care plan section) Acute Rehab OT Goals Patient Stated Goal: unable/unwilling to participate in goal setting OT Goal Formulation: Patient unable to participate in goal setting Time For Goal Achievement: 06/13/14 Potential to Achieve Goals: Fair ADL Goals Pt Will Perform Grooming: with supervision;sitting Pt Will Perform Upper Body Bathing: with supervision;sitting Pt Will Transfer to Toilet: with mod  assist;stand pivot transfer;bedside commode Additional ADL Goal #1: Pt will sustain attention to familiar ADL tasks x 5 mins with no more than 2 cues  OT Frequency: Min 2X/week   Barriers to D/C: Decreased caregiver support          Co-evaluation PT/OT/SLP Co-Evaluation/Treatment: Yes Reason  for Co-Treatment: For patient/therapist safety PT goals addressed during session: Mobility/safety with mobility OT goals addressed during session: ADL's and self-care      End of Session Equipment Utilized During Treatment: Rolling walker Nurse Communication: Mobility status  Activity Tolerance: Other (comment);Patient limited by lethargy (cognitive deficits) Patient left: in bed;with call bell/phone within reach;with bed alarm set   Time: 4098-1191 OT Time Calculation (min): 29 min Charges:  OT General Charges $OT Visit: 1 Procedure OT Evaluation $Initial OT Evaluation Tier I: 1 Procedure OT Treatments $Therapeutic Activity: 8-22 mins G-Codes:    Kashae Carstens M 09-Jun-2014, 6:27 PM

## 2014-05-30 NOTE — Procedures (Addendum)
Patient was seen on dialysis and the procedure was supervised. BFR 400 Via LUE AVF  BP is 97/54.  Patient appears to be tolerating treatment well

## 2014-05-30 NOTE — Progress Notes (Signed)
NUTRITION FOLLOW-UP  DOCUMENTATION CODES Per approved criteria  -Morbid Obesity   INTERVENTION:  Continue Nepro Shake po TID, each supplement provides 425 kcal and 19 grams protein  Encourage PO intake and assist with feedings.  Will continue to monitor  NUTRITION DIAGNOSIS: Inadequate oral intake related to altered mental status as evidenced by poor po intake 25%; ongoing  Goal: Pt to meet >/= 90% of their estimated nutrition needs, not met  Monitor:  PO and supplemental intake, weight, labs, I/O's  Admitting Dx: HCAP  ASSESSMENT: 73 y.o. male with prior h/o ESRD ON HD due for HD today , was brought in for confusion. On arrival to ED, pt was found to have right sided pneumonia and a urinary tract infection. He is confused.  Most of the history obtained from EDP. He reports having neck pain for several months and wants pain medications . He denies any other complaints. He was referred to medical service for management of HCAP.   9/10-Pt with altered mental status, was unable to obtain history, no family was present during visit. Per RN, patient will only eat when encouraged. PO intake this AM 25%. Suggested sending Nepro shakes and RN agreed that he may drink them. Per documentation, wt has decreased by 12 lbs since 8/31, question if this is d/t fluid.Unable to perform Nutrition Focused Physical Exam d/t altered mental status.  9/16- Pt reports his appetite is good. Meal completion however has been varied from 0-75%. Spoke with RN, he reports pt eats well if awake and alert. He also needs assistance with feedings. Pt has also been drinking his Nepro shakes well. Will continue with current orders. Pt was encouraged to eat his food at meals and to drink his oral supplement.  Pt with observed no significant fat or muscle mass loss.  Height: Ht Readings from Last 1 Encounters:  05/23/14 5' 8.9" (1.75 m)    Weight: Wt Readings from Last 1 Encounters:  05/30/14 255 lb 1.2 oz  (115.7 kg)   BMI:  41.5 (adjusted for BKA)  Adjusted body weight: 106 kg  Re-Estimated Nutritional Needs: Kcal: 2100-2350 Protein: 105-120 grams Fluid: 1.2L/day  Skin: incision upper left arm, non-pitting RLE & LLE edema  Diet Order: Renal with 1200 ml fluids   Intake/Output Summary (Last 24 hours) at 05/30/14 1017 Last data filed at 05/30/14 0518  Gross per 24 hour  Intake      0 ml  Output    100 ml  Net   -100 ml    Last BM: 9/11  Labs:   Recent Labs Lab 05/26/14 1239 05/27/14 0759 05/28/14 1640  NA 135* 136* 134*  K 4.8 4.4 4.9  CL 91* 93* 90*  CO2 25 26 26   BUN 48* 69* 48*  CREATININE 7.03* 8.37* 6.66*  CALCIUM 10.0 9.6 9.6  PHOS  --  6.1*  --   GLUCOSE 166* 155* 149*    CBG (last 3)   Recent Labs  05/28/14 1130  GLUCAP 139*    Scheduled Meds: . aspirin EC  325 mg Oral Daily  . atorvastatin  20 mg Oral QHS  . carbidopa-levodopa  0.5 tablet Oral TID  .  ceFAZolin (ANCEF) IV  2 g Intravenous Q T,Th,Sat-1800  . darbepoetin (ARANESP) injection - DIALYSIS  60 mcg Intravenous Q Thu-HD  . divalproex  1,500 mg Oral Daily  . escitalopram  5 mg Oral Daily  . feeding supplement (NEPRO CARB STEADY)  237 mL Oral TID WC  . hydrocortisone  Rectal BID  . Influenza vac split quadrivalent PF  0.5 mL Intramuscular Tomorrow-1000  . metoprolol succinate  12.5 mg Oral QHS  . multivitamin  1 tablet Oral QHS  . sevelamer carbonate  3,200 mg Oral TID WC  . sucroferric oxyhydroxide  500 mg Oral Q supper    Continuous Infusions:   Past Medical History  Diagnosis Date  . ESRD on hemodialysis     Started HD around 2009, maybe longer.  Was living in [place], [place] then.  Moved to [place] in 2014 and gets HD now at [facility] on [address] on a TTS schedule.  ESRD was due to DM and HTN.     Marland Kitchen Peripheral vascular disease   . Hypertension   . Hyperlipidemia   . Arthritis     Gout  . Paroxysmal atrial fibrillation   . Brain tumor 2009  . Seizure disorder 2009   . Anemia   . Hyperparathyroidism, secondary   . Diabetes mellitus type 2 with complications     Type II, diet controlled  . Proteus mirabilis infection   . CHF (congestive heart failure)     grade 1 DD, preserved EF, [facility] records  . 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 brain tumor  . GERD (gastroesophageal reflux disease)   . Hx of cardiac cath     a. LHC (9/14):  Normal cors, EF 65%.     Past Surgical History  Procedure Laterality Date  . Av fistula placement Left     arm  . 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 Eye surgery Bilateral 2000    cataract  . Retinopathy surgery      Hx. of laser treatments for diabetics  . Craniectomy / craniotomy for excision of brain tumor  2009  . Fistulogram Left 05/28/2014    Procedure: FISTULOGRAM (ARM);  Surgeon: Conrad Leetsdale, MD;  Location: [place];  Service: Vascular;  Laterality: Left;  Angioplasty of left sephalic vein times two.  . Revision of arteriovenous goretex graft Left 05/28/2014    Procedure: POSSIBLE REVISION OF FISTULA;  Surgeon: Conrad Gumbranch, MD;  Location: [place];  Service: Vascular;  Laterality: Left;  Revision of Left arm fistula.  Darden Dates without cardioversion N/A 05/29/2014    Procedure: TRANSESOPHAGEAL ECHOCARDIOGRAM (TEE);  Surgeon: Fay Records, MD;  Location: [place];  Service: Cardiovascular;  Laterality: N/A;    Kallie Locks, MS, Provisional LDN Pager # [phone] After hours/ weekend pager # [phone]

## 2014-05-30 NOTE — Progress Notes (Signed)
INFECTIOUS DISEASE PROGRESS NOTE  ID: [name] [name] is a 73 y.o. male with  Active Problems:   HCAP (healthcare-associated pneumonia)  Subjective: Without complaints  Abtx:  Anti-infectives   Start     Dose/Rate Route Frequency Ordered Stop   05/30/14 1800  ceFAZolin (ANCEF) IVPB 2 g/50 mL premix     2 g 100 mL/hr over 30 Minutes Intravenous Every T-Th-Sa (1800) 05/30/14 0817     05/30/14 1200  ceFAZolin (ANCEF) IVPB 2 g/50 mL premix  Status:  Discontinued     2 g 100 mL/hr over 30 Minutes Intravenous Every T-Th-Sa (Hemodialysis) 05/29/14 1045 05/30/14 0817   05/27/14 1200  ceFAZolin (ANCEF) IVPB 2 g/50 mL premix     2 g 100 mL/hr over 30 Minutes Intravenous Every T-Th-Sa (Hemodialysis) 05/26/14 1559 05/27/14 1223   05/25/14 1800  ceFEPIme (MAXIPIME) 2 g in dextrose 5 % 50 mL IVPB  Status:  Discontinued     2 g 100 mL/hr over 30 Minutes Intravenous Every T-Th-Sa (1800) 05/24/14 1023 05/26/14 1434   05/25/14 1200  vancomycin (VANCOCIN) IVPB 1000 mg/200 mL premix  Status:  Discontinued     1,000 mg 200 mL/hr over 60 Minutes Intravenous Every T-Th-Sa (Hemodialysis) 05/23/14 1729 05/23/14 1752   05/25/14 1200  vancomycin (VANCOCIN) IVPB 1000 mg/200 mL premix  Status:  Discontinued     1,000 mg 200 mL/hr over 60 Minutes Intravenous Every T-Th-Sa (Hemodialysis) 05/23/14 1854 05/26/14 1434   05/25/14 1200  ceFEPIme (MAXIPIME) 2 g in dextrose 5 % 50 mL IVPB  Status:  Discontinued     2 g 100 mL/hr over 30 Minutes Intravenous Every T-Th-Sa (Hemodialysis) 05/23/14 1855 05/24/14 1023   05/23/14 2100  ceFEPIme (MAXIPIME) 2 g in dextrose 5 % 50 mL IVPB  Status:  Discontinued     2 g 100 mL/hr over 30 Minutes Intravenous Every T-Th-Sa (Hemodialysis) 05/23/14 1751 05/23/14 1855   05/23/14 2100  vancomycin (VANCOCIN) IVPB 1000 mg/200 mL premix  Status:  Discontinued     1,000 mg 200 mL/hr over 60 Minutes Intravenous Every T-Th-Sa (Hemodialysis) 05/23/14 1752 05/23/14 1854   05/23/14 1930   vancomycin (VANCOCIN) IVPB 1000 mg/200 mL premix  Status:  Discontinued     1,000 mg 200 mL/hr over 60 Minutes Intravenous To Hemodialysis 05/23/14 1854 05/23/14 1856   05/23/14 1930  ceFEPIme (MAXIPIME) 2 g in dextrose 5 % 50 mL IVPB     2 g 100 mL/hr over 30 Minutes Intravenous To Hemodialysis 05/23/14 1855 05/23/14 2318   05/23/14 1900  ceFEPIme (MAXIPIME) 2 g in dextrose 5 % 50 mL IVPB  Status:  Discontinued     2 g 100 mL/hr over 30 Minutes Intravenous Every T-Th-Sa (Hemodialysis) 05/23/14 1537 05/23/14 1751   05/23/14 1900  vancomycin (VANCOCIN) IVPB 1000 mg/200 mL premix  Status:  Discontinued     1,000 mg 200 mL/hr over 60 Minutes Intravenous Every T-Th-Sa (Hemodialysis) 05/23/14 1537 05/23/14 1729   05/23/14 1900  vancomycin (VANCOCIN) 2,500 mg in sodium chloride 0.9 % 500 mL IVPB     2,500 mg 250 mL/hr over 120 Minutes Intravenous To Hemodialysis 05/23/14 1856 05/24/14 0014   05/23/14 1830  vancomycin (VANCOCIN) 2,500 mg in sodium chloride 0.9 % 500 mL IVPB  Status:  Discontinued     2,500 mg 250 mL/hr over 120 Minutes Intravenous To Hemodialysis 05/23/14 1729 05/23/14 1853   05/23/14 1400  vancomycin (VANCOCIN) 2,500 mg in sodium chloride 0.9 % 500 mL IVPB  2,500 mg 250 mL/hr over 120 Minutes Intravenous  Once 05/23/14 1309 05/23/14 1530   05/23/14 1300  ceFEPIme (MAXIPIME) 1 g in dextrose 5 % 50 mL IVPB  Status:  Discontinued     1 g 100 mL/hr over 30 Minutes Intravenous  Once 05/23/14 1257 05/23/14 1535      Medications:  Scheduled: . aspirin EC  325 mg Oral Daily  . atorvastatin  20 mg Oral QHS  . carbidopa-levodopa  0.5 tablet Oral TID  .  ceFAZolin (ANCEF) IV  2 g Intravenous Q T,Th,Sat-1800  . darbepoetin (ARANESP) injection - DIALYSIS  60 mcg Intravenous Q Thu-HD  . divalproex  1,500 mg Oral Daily  . enoxaparin (LOVENOX) injection  30 mg Subcutaneous Q24H  . escitalopram  5 mg Oral Daily  . feeding supplement (NEPRO CARB STEADY)  237 mL Oral TID WC  .  hydrocortisone   Rectal BID  . Influenza vac split quadrivalent PF  0.5 mL Intramuscular Tomorrow-1000  . metoprolol succinate  12.5 mg Oral QHS  . multivitamin  1 tablet Oral QHS  . sevelamer carbonate  3,200 mg Oral TID WC  . sucroferric oxyhydroxide  500 mg Oral Q supper    Objective: Vital signs in last 24 hours: Temp:  [98.3 F (36.8 C)-98.7 F (37.1 C)] 98.7 F (37.1 C) (09/15 1322) Pulse Rate:  [71-78] 76 (09/15 1454) Resp:  [16-18] 16 (09/15 1322) BP: (82-139)/(13-62) 116/53 mmHg (09/15 1454) SpO2:  [94 %-98 %] 95 % (09/15 1322) Weight:  [113.3 kg (249 lb 12.5 oz)-115.7 kg (255 lb 1.2 oz)] 113.3 kg (249 lb 12.5 oz) (09/15 1322)   General appearance: alert and no distress Resp: clear to auscultation bilaterally Cardio: regular rate and rhythm GI: normal findings: bowel sounds normal and soft, non-tender Extremities: LUE wound- clean. upper portion dressed.   Lab Results  Recent Labs  05/28/14 1640 05/30/14 0852  WBC 13.9* 12.9*  HGB 9.2* 8.3*  HCT 27.7* 24.4*  NA 134*  --   K 4.9  --   CL 90*  --   CO2 26  --   BUN 48*  --   CREATININE 6.66*  --    Liver Panel No results found for this basename: PROT, ALBUMIN, AST, ALT, ALKPHOS, BILITOT, BILIDIR, IBILI,  in the last 72 hours Sedimentation Rate No results found for this basename: ESRSEDRATE,  in the last 72 hours C-Reactive Protein No results found for this basename: CRP,  in the last 72 hours  Microbiology: Recent Results (from the past 240 hour(s))  URINE CULTURE     Status: None   Collection Time    05/23/14 11:50 AM      Result Value Ref Range Status   Specimen Description URINE, RANDOM   Final   Special Requests NONE   Final   Culture  Setup Time     Final   Value: 05/23/2014 17:20     Performed at [facility]     Final   Value: >=100,000 COLONIES/ML     Performed at [facility]   Culture     Final   Value: ESCHERICHIA COLI     Performed at [facility]   Report Status 05/26/2014 FINAL   Final   Organism ID, Bacteria ESCHERICHIA COLI   Final  CULTURE, BLOOD (ROUTINE X 2)     Status: None   Collection Time    05/23/14  1:17 PM  Result Value Ref Range Status   Specimen Description BLOOD HAND RIGHT   Final   Special Requests BOTTLES DRAWN AEROBIC ONLY 3CC   Final   Culture  Setup Time     Final   Value: 05/23/2014 16:42     Performed at [facility]   Culture     Final   Value: STAPHYLOCOCCUS AUREUS     Note: RIFAMPIN AND GENTAMICIN SHOULD NOT BE USED AS SINGLE DRUGS FOR TREATMENT OF STAPH INFECTIONS.     Note: Gram Stain Report Called to,Read Back By and Verified With: BONITA@12 :50PM ON 05/24/14 BY DANTS     Performed at [facility]   Report Status 05/26/2014 FINAL   Final   Organism ID, Bacteria STAPHYLOCOCCUS AUREUS   Final  CULTURE, BLOOD (ROUTINE X 2)     Status: None   Collection Time    05/23/14  2:15 PM      Result Value Ref Range Status   Specimen Description BLOOD RIGHT HAND   Final   Special Requests BOTTLES DRAWN AEROBIC AND ANAEROBIC 5CC   Final   Culture  Setup Time     Final   Value: 05/23/2014 18:52     Performed at [facility]   Culture     Final   Value: STAPHYLOCOCCUS AUREUS     Note: SUSCEPTIBILITIES PERFORMED ON PREVIOUS CULTURE WITHIN THE LAST 5 DAYS.     Note: Gram Stain Report Called to,Read Back By and Verified With: JULIA OSBORNE ON 05/24/2014 AT 8:52P BY WILEJ     Performed at [facility]   Report Status 05/26/2014 FINAL   Final  MRSA PCR SCREENING     Status: None   Collection Time    05/23/14  3:49 PM      Result Value Ref Range Status   MRSA by PCR NEGATIVE  NEGATIVE Final   Comment:            The GeneXpert MRSA Assay (FDA     approved for NASAL specimens     only), is one component of a     comprehensive MRSA colonization     surveillance program. It is not     intended to diagnose MRSA     infection nor to guide or     monitor treatment for      MRSA infections.  CULTURE, BLOOD (ROUTINE X 2)     Status: None   Collection Time    05/25/14  3:05 PM      Result Value Ref Range Status   Specimen Description BLOOD HEMODIALYSIS GRAFT AVF   Final   Special Requests BOTTLES DRAWN AEROBIC AND ANAEROBIC 10CC   Final   Culture  Setup Time     Final   Value: 05/25/2014 19:18     Performed at [facility]   Culture     Final   Value:        BLOOD CULTURE RECEIVED NO GROWTH TO DATE CULTURE WILL BE HELD FOR 5 DAYS BEFORE ISSUING A FINAL NEGATIVE REPORT     Performed at [facility]   Report Status PENDING   Incomplete  CULTURE, BLOOD (ROUTINE X 2)     Status: None   Collection Time    05/25/14  3:15 PM      Result Value Ref Range Status   Specimen Description HEMODIALYSIS GRAFT   Final   Special Requests BOTTLES DRAWN AEROBIC AND ANAEROBIC 10CC AVF   Final  Culture  Setup Time     Final   Value: 05/25/2014 19:22     Performed at [facility]   Culture     Final   Value:        BLOOD CULTURE RECEIVED NO GROWTH TO DATE CULTURE WILL BE HELD FOR 5 DAYS BEFORE ISSUING A FINAL NEGATIVE REPORT     Performed at [facility]   Report Status PENDING   Incomplete    Studies/Results: No results found.   Assessment/Plan: Bacteremia/MSSA  E coli (pan-sens) in UCx  ESRD  Spinal Pain- hx of cervical spondylosis  R hip pain   Total days of antibiotics 8 (ancef)  TEE (-) for vegetation  Repeat BCx 9-10 (-) to date Vascular graft revised 9-13  MRI R hip, with sedation, pending Duration of anbx based on his MRI result.          [name] Infectious Diseases (pager) [phone] [email or web address] 05/30/2014, 4:07 PM  LOS: 7 days

## 2014-05-30 NOTE — Progress Notes (Signed)
PT Cancellation Note  Patient Details Name: [name] [name] MRN: [id number] DOB: 11/15/1940   Cancelled Treatment:    Reason Eval/Treat Not Completed: Patient at procedure or test/unavailable Pt off floor at dialysis. Will need an MRI under sedation. May be getting that procedure later today after dialysis or tomorrow. Will follow up when time allows, later in PM or on 9/16.   Candy Sledge A 05/30/2014, 10:38 AM Candy Sledge, PT, DPT [phone]

## 2014-05-30 NOTE — Progress Notes (Addendum)
[facility] PROGRESS NOTE  [name] [name] VPX:[id number] DOB: [birth year] DOA: 05/23/2014 PCP: [name] Potts, MD  Brief Narrative: [name] [name] is a 73 y.o.with prior h/o ESRD on HD, spinal stenosis, seizure d/o, tremors was brought in for confusion.  In ER, he was noted to have right sided pneumonia and a urinary tract infection with Metabolic encephalopathy. Subsequently noted to have MSSA bacteremia, ID following, now on Ancef. Underwent TEE, negative for endocarditis. ID also recommended R hip MRI due to mild hip pain, awaiting anesthesia for conscious sedation for MRI  Assessment/Plan: MSSA bacteremia  - likely due to HCAP/PNA and or stitch abscesses on graft - LUE AVF with stitch abscesses on graft, VVS aware, s/p revision 9/13, with fistulogram and repair  - continue Ancef per ID with HD, pharmacy to dose, start date 9/11 - repeat Blood CX from 9/10 NGTD - MRI R hip pending, attempted with ativan yesterday, unsuccessful, MRI staff recommended conscious sedation for MRI-to be coordinated with anesthesia - TEE negative for endocarditis  Ecoli UTI - pan sensitive, > 100K colonies - completed 4days of cefepime, now on ancef  Metabolic encephalopathy -due to 1,2, gabapentin, narcotics -Gabapentin and percocet held -improved, but intermittently worse after TEE and seadtion yetserday  Spinal stenosis and cervical spondylosis -FU with Dr.Elsner -Narcotics were on hold due to mentation -resumed low dose due to severe chronic pain  Seizure d/o -continue Depakote, level slightly low  Tremors -continue carb/levo   ESRD  -HD per Renal  Hypertension/volume  -stable, continue TOprol  Anemia of chronic disease -Aranesp with HD   AVF bleeding  -resolved -per Renal/VVS  DVt proph: lovenox  Code Status: Full Code Family Communication: no family at bedside, attempted to reach wife [name] [name] x2 @ [phone], finally was able to contact wife 9/11; Disposition  Plan: SNF pending MRI if stable  Procedure: -TEE 9/14 Negative for endocarditis  PROCEDURE: 9/13 1. left brachiocephalic arteriovenous fistula cannulation under ultrasound guidance  2. left arm fistulogram  3. Venoplasty of cephalic vein x 2 (6 mm x 40 mm, 8 mm x 80 mm)  4. Revision of distal left brachiocephalic arteriovenous fistula    Consultants:  Renal  Antibiotics:  Cefepime/Vanc 9/8 -9/11  Ancef 9/11  HPI/Subjective: Some confusion, unable to tolerate MRI despite ATivan x2  Objective: Filed Vitals:   05/30/14 1000  BP: 101/17  Pulse: 78  Temp:   Resp:     Intake/Output Summary (Last 24 hours) at 05/30/14 1049 Last data filed at 05/30/14 0518  Gross per 24 hour  Intake      0 ml  Output    100 ml  Net   -100 ml   Filed Weights   05/28/14 1946 05/29/14 2014 05/30/14 0840  Weight: 114.7 kg (252 lb 13.9 oz) 114.701 kg (252 lb 13.9 oz) 115.7 kg (255 lb 1.2 oz)    Exam:   General:  Awake, more alert, oriented to self and place, less confused  Cardiovascular: S1S2/RRR  Respiratory: poor air movt  Abdomen: soft, Nt, BS present  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, L BKA  NEuro: moves all ext, no localizing signs   Data Reviewed: Basic Metabolic Panel:  Recent Labs Lab 05/23/14 1156 05/25/14 1249 05/26/14 1239 05/27/14 0759 05/28/14 1640  NA 136* 138 135* 136* 134*  K 5.3 5.3 4.8 4.4 4.9  CL 90* 92* 91* 93* 90*  CO2 22 24 25 26 26   GLUCOSE 180* 199* 166* 155* 149*  BUN 94* 79* 48* 69* 48*  CREATININE 12.63* 10.50*  7.03* 8.37* 6.66*  CALCIUM 10.1 10.1 10.0 9.6 9.6  PHOS  --   --   --  6.1*  --    Liver Function Tests:  Recent Labs Lab 05/23/14 1156 05/27/14 0759  AST 12  --   ALT <5  --   ALKPHOS 66  --   BILITOT 0.2*  --   PROT 7.7  --   ALBUMIN 2.9* 2.3*   No results found for this basename: LIPASE, AMYLASE,  in the last 168 hours No results found for this basename: AMMONIA,  in the last 168 hours CBC:  Recent Labs Lab  05/25/14 1249 05/26/14 1239 05/27/14 0500 05/28/14 1640 05/30/14 0852  WBC 17.7* 12.3* 11.3* 13.9* 12.9*  HGB 10.0* 9.2* 8.4* 9.2* 8.3*  HCT 30.0* 27.7* 25.2* 27.7* 24.4*  MCV 100.3* 99.3 101.2* 98.9 97.2  PLT 201 331 279 389 338   Cardiac Enzymes: No results found for this basename: CKTOTAL, CKMB, CKMBINDEX, TROPONINI,  in the last 168 hours BNP (last 3 results)  Recent Labs  01/15/14 1316  PROBNP 1399.0*   CBG:  Recent Labs Lab 05/28/14 1130  GLUCAP 139*    Recent Results (from the past 240 hour(s))  URINE CULTURE     Status: None   Collection Time    05/23/14 11:50 AM      Result Value Ref Range Status   Specimen Description URINE, RANDOM   Final   Special Requests NONE   Final   Culture  Setup Time     Final   Value: 05/23/2014 17:20     Performed at [place]     Final   Value: >=100,000 COLONIES/ML     Performed at [facility]   Culture     Final   Value: ESCHERICHIA COLI     Performed at [facility]   Report Status 05/26/2014 FINAL   Final   Organism ID, Bacteria ESCHERICHIA COLI   Final  CULTURE, BLOOD (ROUTINE X 2)     Status: None   Collection Time    05/23/14  1:17 PM      Result Value Ref Range Status   Specimen Description BLOOD HAND RIGHT   Final   Special Requests BOTTLES DRAWN AEROBIC ONLY 3CC   Final   Culture  Setup Time     Final   Value: 05/23/2014 16:42     Performed at [facility]   Culture     Final   Value: STAPHYLOCOCCUS AUREUS     Note: RIFAMPIN AND GENTAMICIN SHOULD NOT BE USED AS SINGLE DRUGS FOR TREATMENT OF STAPH INFECTIONS.     Note: Gram Stain Report Called to,Read Back By and Verified With: BONITA@12 :50PM ON 05/24/14 BY DANTS     Performed at [facility]   Report Status 05/26/2014 FINAL   Final   Organism ID, Bacteria STAPHYLOCOCCUS AUREUS   Final  CULTURE, BLOOD (ROUTINE X 2)     Status: None   Collection Time    05/23/14  2:15 PM      Result Value Ref Range  Status   Specimen Description BLOOD RIGHT HAND   Final   Special Requests BOTTLES DRAWN AEROBIC AND ANAEROBIC 5CC   Final   Culture  Setup Time     Final   Value: 05/23/2014 18:52     Performed at [facility]   Culture     Final   Value: STAPHYLOCOCCUS AUREUS  Note: SUSCEPTIBILITIES PERFORMED ON PREVIOUS CULTURE WITHIN THE LAST 5 DAYS.     Note: Gram Stain Report Called to,Read Back By and Verified With: JULIA OSBORNE ON 05/24/2014 AT 8:52P BY WILEJ     Performed at [facility]   Report Status 05/26/2014 FINAL   Final  MRSA PCR SCREENING     Status: None   Collection Time    05/23/14  3:49 PM      Result Value Ref Range Status   MRSA by PCR NEGATIVE  NEGATIVE Final   Comment:            The GeneXpert MRSA Assay (FDA     approved for NASAL specimens     only), is one component of a     comprehensive MRSA colonization     surveillance program. It is not     intended to diagnose MRSA     infection nor to guide or     monitor treatment for     MRSA infections.  CULTURE, BLOOD (ROUTINE X 2)     Status: None   Collection Time    05/25/14  3:05 PM      Result Value Ref Range Status   Specimen Description BLOOD HEMODIALYSIS GRAFT AVF   Final   Special Requests BOTTLES DRAWN AEROBIC AND ANAEROBIC 10CC   Final   Culture  Setup Time     Final   Value: 05/25/2014 19:18     Performed at [facility]   Culture     Final   Value:        BLOOD CULTURE RECEIVED NO GROWTH TO DATE CULTURE WILL BE HELD FOR 5 DAYS BEFORE ISSUING A FINAL NEGATIVE REPORT     Performed at [facility]   Report Status PENDING   Incomplete  CULTURE, BLOOD (ROUTINE X 2)     Status: None   Collection Time    05/25/14  3:15 PM      Result Value Ref Range Status   Specimen Description HEMODIALYSIS GRAFT   Final   Special Requests BOTTLES DRAWN AEROBIC AND ANAEROBIC 10CC AVF   Final   Culture  Setup Time     Final   Value: 05/25/2014 19:22     Performed at [facility]    Culture     Final   Value:        BLOOD CULTURE RECEIVED NO GROWTH TO DATE CULTURE WILL BE HELD FOR 5 DAYS BEFORE ISSUING A FINAL NEGATIVE REPORT     Performed at [facility]   Report Status PENDING   Incomplete     Studies: No results found.  Scheduled Meds: . aspirin EC  325 mg Oral Daily  . atorvastatin  20 mg Oral QHS  . carbidopa-levodopa  0.5 tablet Oral TID  .  ceFAZolin (ANCEF) IV  2 g Intravenous Q T,Th,Sat-1800  . darbepoetin (ARANESP) injection - DIALYSIS  60 mcg Intravenous Q Thu-HD  . divalproex  1,500 mg Oral Daily  . escitalopram  5 mg Oral Daily  . feeding supplement (NEPRO CARB STEADY)  237 mL Oral TID WC  . hydrocortisone   Rectal BID  . Influenza vac split quadrivalent PF  0.5 mL Intramuscular Tomorrow-1000  . metoprolol succinate  12.5 mg Oral QHS  . multivitamin  1 tablet Oral QHS  . sevelamer carbonate  3,200 mg Oral TID WC  . sucroferric oxyhydroxide  500 mg Oral Q supper   Continuous Infusions:  Antibiotics  Given (last 72 hours)   Date/Time Action Medication Dose Rate   05/27/14 1153 Given   ceFAZolin (ANCEF) IVPB 2 g/50 mL premix 2 g 100 mL/hr      Active Problems:   HCAP (healthcare-associated pneumonia)    Time spent: 89min    Sully Manzi  [facility] Pager [phone]. If 7PM-7AM, please contact night-coverage at [email or web address], password [facility] 05/30/2014, 10:49 AM  LOS: 7 days

## 2014-05-30 NOTE — Progress Notes (Signed)
Patient scheduled to have an MRI done today while under conscious sedation. Patient is in dialysis and will not be off the machine until around 1:30 pm. Anesthesia stated they would attempt to coordinate the MRI for today, but it likely won't be able to be done until tomorrow. Will continue to monitor.  Joellen Jersey, RN.

## 2014-05-31 ENCOUNTER — Encounter (HOSPITAL_COMMUNITY): Payer: Self-pay | Admitting: Anesthesiology

## 2014-05-31 ENCOUNTER — Encounter (HOSPITAL_COMMUNITY)
Admission: EM | Disposition: A | Discharge status: Skilled Nursing Facility | Payer: Self-pay | Source: Home / Self Care | Attending: Internal Medicine

## 2014-05-31 ENCOUNTER — Inpatient Hospital Stay (HOSPITAL_COMMUNITY): Payer: Medicare Other | Admitting: Anesthesiology

## 2014-05-31 ENCOUNTER — Inpatient Hospital Stay (HOSPITAL_COMMUNITY): Payer: Medicare Other

## 2014-05-31 ENCOUNTER — Encounter (HOSPITAL_COMMUNITY): Payer: Medicare Other | Admitting: Anesthesiology

## 2014-05-31 DIAGNOSIS — N390 Urinary tract infection, site not specified: Secondary | ICD-10-CM

## 2014-05-31 DIAGNOSIS — M25559 Pain in unspecified hip: Secondary | ICD-10-CM

## 2014-05-31 DIAGNOSIS — M5380 Other specified dorsopathies, site unspecified: Secondary | ICD-10-CM

## 2014-05-31 DIAGNOSIS — A498 Other bacterial infections of unspecified site: Secondary | ICD-10-CM

## 2014-05-31 HISTORY — PX: RADIOLOGY WITH ANESTHESIA: SHX6223

## 2014-05-31 LAB — CULTURE, BLOOD (ROUTINE X 2)
CULTURE: NO GROWTH
Culture: NO GROWTH

## 2014-05-31 LAB — POCT I-STAT 4, (NA,K, GLUC, HGB,HCT)
Glucose, Bld: 102 mg/dL — ABNORMAL HIGH (ref 70–99)
HEMATOCRIT: 26 % — AB (ref 39.0–52.0)
Hemoglobin: 8.8 g/dL — ABNORMAL LOW (ref 13.0–17.0)
Potassium: 4.6 mEq/L (ref 3.7–5.3)
Sodium: 138 mEq/L (ref 137–147)

## 2014-05-31 LAB — GLUCOSE, CAPILLARY: Glucose-Capillary: 98 mg/dL (ref 70–99)

## 2014-05-31 SURGERY — RADIOLOGY WITH ANESTHESIA
Anesthesia: Monitor Anesthesia Care | Laterality: Left

## 2014-05-31 MED ORDER — SODIUM CHLORIDE 0.9 % IV SOLN: INTRAVENOUS | Status: DC

## 2014-05-31 MED ORDER — CINACALCET HCL 30 MG PO TABS
30.0000 mg | ORAL_TABLET | Freq: Every day | ORAL | Status: DC
  Administered 2014-05-31 – 2014-06-01 (×2): 30 mg via ORAL
  Filled 2014-05-31 (×2): qty 1

## 2014-05-31 MED ORDER — SCOPOLAMINE 1 MG/3DAYS TD PT72: 1.0000 | MEDICATED_PATCH | TRANSDERMAL | Status: DC

## 2014-05-31 MED ORDER — SODIUM CHLORIDE 0.9 % IV SOLN
INTRAVENOUS | Status: DC | PRN
  Administered 2014-05-31: 09:00:00 via INTRAVENOUS

## 2014-05-31 NOTE — Anesthesia Preprocedure Evaluation (Addendum)
Anesthesia Evaluation  Patient identified by MRN, date of birth, ID band Patient awake    Reviewed: Allergy & Precautions, H&P , NPO status , Patient's Chart, lab work & pertinent test results  Airway Mallampati: III TM Distance: >3 FB Neck ROM: full    Dental   Pulmonary  breath sounds clear to auscultation        Cardiovascular hypertension, Pt. 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, Residual Symptoms    GI/Hepatic GERD-  ,  Endo/Other  diabetes, Poorly Controlled, Type 2  Renal/GU ESRF and DialysisRenal disease     Musculoskeletal   Abdominal (+) + obese,   Peds  Hematology  (+) anemia , JEHOVAH'S WITNESS  Anesthesia Other Findings   Reproductive/Obstetrics                         Anesthesia Physical Anesthesia Plan  ASA: IV  Anesthesia Plan: MAC and General   Post-op Pain Management:    Induction: Intravenous  Airway Management Planned: Oral ETT  Additional Equipment:   Intra-op Plan:   Post-operative Plan:   Informed Consent: I have reviewed the patients History and Physical, chart, labs and discussed the procedure including the risks, benefits and alternatives for the proposed anesthesia with the patient or authorized representative who has indicated his/her understanding and acceptance.   Dental Advisory Given  Plan Discussed with: Anesthesiologist, CRNA and Surgeon  Anesthesia Plan Comments: (High risk patient, suspect controlled airwa desired in view of obesity and likely OSA and GERDS)       Anesthesia Quick Evaluation

## 2014-05-31 NOTE — Clinical Social Work Psychosocial (Signed)
Clinical Social Work Department BRIEF PSYCHOSOCIAL ASSESSMENT 05/31/2014  Patient:  [name] [name],[name] [name]     Account Number:  [id number]     Admit date:  05/23/2014  Clinical Social Worker:  Frederico Hamman  Date/Time:  05/31/2014 05:35 AM  Referred by:  Physician  Date Referred:  05/31/2014 Referred for  SNF Placement   Other Referral:   Interview type:  Patient Other interview type:   CSW also talked with patient's wife Cathering Wiedman.    PSYCHOSOCIAL DATA Living Status:  FACILITY Admitted from facility:  [facility] Level of care:   Primary support name:  [name] Gossen Primary support relationship to patient:  SPOUSE Degree of support available:   Strong support    CURRENT CONCERNS Current Concerns  Post-Acute Placement   Other Concerns:    SOCIAL WORK ASSESSMENT / PLAN CSW received consult for SNF placement for short-term rehab for patient. CSW initially spoke with Mrs. Mullins regarding rehab for patient. She had been visiting with patient and was leaving the unit. Mrs. Glendening is in agreement with ST rehab and requested that Darby talk with patient. She ask CSW to talk with patient about [facility] as she just discharged from that facility after receiving rehab. CSW has been in contact with Deb at [facility] regarding patient, and clinicals had been transmitted to facility for nursing review. CSW received call from [place] at [facility] and she is recommending that patient go to a SNF for short-term rehab. She requested that CSW inform patient that she is recommending ST rehab before returning to [facility].    CSW visited patient to discuss discharge plans and recommendations for ST rehab from MD/PT/OT and Deb at [facility]. Patient agreeable and prefers [facility] as this is where wife received rehab.   Assessment/plan status:  Psychosocial Support/Ongoing Assessment of Needs Other assessment/ plan:   Information/referral to community  resources:   None needed or requested at this time    PATIENT'S/FAMILY'S RESPONSE TO PLAN OF CARE: Patient and wife agreeable to [place] rehab. [facility] staff person reviewed patient's clinicals and also is recommending that patient go to SNF for rehab before returning to ALF.

## 2014-05-31 NOTE — Progress Notes (Signed)
[facility] PROGRESS NOTE  [name] [name] QJJ:[id number] DOB: Oct 21, 1940 DOA: 05/23/2014 PCP: Donetta Potts, MD  Assessment/Plan: 73 y.o.with prior h/o ESRD on HD, spinal stenosis, seizure d/o, tremors was brought in for confusion.  -In ER, he was noted to have right sided pneumonia and a urinary tract infection with Metabolic encephalopathy.  Subsequently noted to have MSSA bacteremia, ID following, now on Ancef.  Underwent TEE, negative for endocarditis.    1. MSSA bacteremia, PNA  - likely due to HCAP/PNA and or stitch abscesses on graft  - LUE AVF with stitch abscesses on graft, VVS aware, s/p revision 9/13, with fistulogram and repair  - continue Ancef per ID with HD, pharmacy to dose, start date 9/11  - repeat Blood CX from 9/10 NGTD  - MRI R hip: DJD, myositis  - TEE negative for endocarditis  2. Ecoli UTI  - pan sensitive, > 100K colonies  - completed 4days of cefepime, now on ancef  3. Metabolic encephalopathy  -due to 1,2, gabapentin, narcotics  -Gabapentin and percocet held  -improved  4. Spinal stenosis and cervical spondylosis  -FU with Dr.Elsner  -Narcotics were on hold due to mentation  -resumed low dose due to severe chronic pain  5. Seizure d/o  -continue Depakote, level slightly low  6. Tremors  -continue carb/levo  7. ESRD  -HD per Renal  8. Hypertension/volume  -stable, continue TOprol  9. Anemia of chronic disease  -Aranesp with HD  10. AVF bleeding s/p fistulogram and revision 9.13 with dr Bridgett Larsson Tissue defect in AVF repaired by Dr Bridgett Larsson -resolved  -per Renal/VVS    D/c plans per ID  Code Status: full Family Communication:  D/w patient, famiyl at the bedside (indicate person spoken with, relationship, and if by phone, the number) Disposition Plan: SNF  Procedure:  -TEE 9/14  Negative for endocarditis  PROCEDURE: 9/13  1. left brachiocephalic arteriovenous fistula cannulation under ultrasound guidance  2. left arm fistulogram  3.  Venoplasty of cephalic vein x 2 (6 mm x 40 mm, 8 mm x 80 mm)  4. Revision of distal left brachiocephalic arteriovenous fistula  Consultants:  Renal Antibiotics:  Cefepime/Vanc 9/8 -9/11  Ancef 9/11 HPI/Subjective: alert  Objective: Filed Vitals:   05/31/14 1130  BP: 115/37  Pulse: 95  Temp: 97.4 F (36.3 C)  Resp:     Intake/Output Summary (Last 24 hours) at 05/31/14 1409 Last data filed at 05/31/14 1109  Gross per 24 hour  Intake    320 ml  Output      0 ml  Net    320 ml   Filed Weights   05/30/14 0840 05/30/14 1322 05/30/14 2100  Weight: 115.7 kg (255 lb 1.2 oz) 113.3 kg (249 lb 12.5 oz) 113.3 kg (249 lb 12.5 oz)    Exam:   General:  alert  Cardiovascular: s1,s2 rrr  Respiratory: CTA BL  Abdomen: soft, nt, nd   Musculoskeletal: no LE edema   Data Reviewed: Basic Metabolic Panel:  Recent Labs Lab 05/25/14 1249 05/26/14 1239 05/27/14 0759 05/28/14 1640 05/31/14 0856  NA 138 135* 136* 134* 138  K 5.3 4.8 4.4 4.9 4.6  CL 92* 91* 93* 90*  --   CO2 24 25 26 26   --   GLUCOSE 199* 166* 155* 149* 102*  BUN 79* 48* 69* 48*  --   CREATININE 10.50* 7.03* 8.37* 6.66*  --   CALCIUM 10.1 10.0 9.6 9.6  --   PHOS  --   --  6.1*  --   --  Liver Function Tests:  Recent Labs Lab 05/27/14 0759  ALBUMIN 2.3*   No results found for this basename: LIPASE, AMYLASE,  in the last 168 hours No results found for this basename: AMMONIA,  in the last 168 hours CBC:  Recent Labs Lab 05/25/14 1249 05/26/14 1239 05/27/14 0500 05/28/14 1640 05/30/14 0852 05/31/14 0856  WBC 17.7* 12.3* 11.3* 13.9* 12.9*  --   HGB 10.0* 9.2* 8.4* 9.2* 8.3* 8.8*  HCT 30.0* 27.7* 25.2* 27.7* 24.4* 26.0*  MCV 100.3* 99.3 101.2* 98.9 97.2  --   PLT 201 331 279 389 338  --    Cardiac Enzymes: No results found for this basename: CKTOTAL, CKMB, CKMBINDEX, TROPONINI,  in the last 168 hours BNP (last 3 results)  Recent Labs  01/15/14 1316  PROBNP 1399.0*   CBG:  Recent  Labs Lab 05/28/14 1130  GLUCAP 139*    Recent Results (from the past 240 hour(s))  URINE CULTURE     Status: None   Collection Time    05/23/14 11:50 AM      Result Value Ref Range Status   Specimen Description URINE, RANDOM   Final   Special Requests NONE   Final   Culture  Setup Time     Final   Value: 05/23/2014 17:20     Performed at [place]     Final   Value: >=100,000 COLONIES/ML     Performed at [facility]   Culture     Final   Value: ESCHERICHIA COLI     Performed at [facility]   Report Status 05/26/2014 FINAL   Final   Organism ID, Bacteria ESCHERICHIA COLI   Final  CULTURE, BLOOD (ROUTINE X 2)     Status: None   Collection Time    05/23/14  1:17 PM      Result Value Ref Range Status   Specimen Description BLOOD HAND RIGHT   Final   Special Requests BOTTLES DRAWN AEROBIC ONLY 3CC   Final   Culture  Setup Time     Final   Value: 05/23/2014 16:42     Performed at [facility]   Culture     Final   Value: STAPHYLOCOCCUS AUREUS     Note: RIFAMPIN AND GENTAMICIN SHOULD NOT BE USED AS SINGLE DRUGS FOR TREATMENT OF STAPH INFECTIONS.     Note: Gram Stain Report Called to,Read Back By and Verified With: BONITA@12 :50PM ON 05/24/14 BY DANTS     Performed at [facility]   Report Status 05/26/2014 FINAL   Final   Organism ID, Bacteria STAPHYLOCOCCUS AUREUS   Final  CULTURE, BLOOD (ROUTINE X 2)     Status: None   Collection Time    05/23/14  2:15 PM      Result Value Ref Range Status   Specimen Description BLOOD RIGHT HAND   Final   Special Requests BOTTLES DRAWN AEROBIC AND ANAEROBIC 5CC   Final   Culture  Setup Time     Final   Value: 05/23/2014 18:52     Performed at [facility]   Culture     Final   Value: STAPHYLOCOCCUS AUREUS     Note: SUSCEPTIBILITIES PERFORMED ON PREVIOUS CULTURE WITHIN THE LAST 5 DAYS.     Note: Gram Stain Report Called to,Read Back By and Verified With: JULIA OSBORNE ON  05/24/2014 AT 8:52P BY [employer]     Performed at [facility]  Report Status 05/26/2014 FINAL   Final  MRSA PCR SCREENING     Status: None   Collection Time    05/23/14  3:49 PM      Result Value Ref Range Status   MRSA by PCR NEGATIVE  NEGATIVE Final   Comment:            The GeneXpert MRSA Assay (FDA     approved for NASAL specimens     only), is one component of a     comprehensive MRSA colonization     surveillance program. It is not     intended to diagnose MRSA     infection nor to guide or     monitor treatment for     MRSA infections.  CULTURE, BLOOD (ROUTINE X 2)     Status: None   Collection Time    05/25/14  3:05 PM      Result Value Ref Range Status   Specimen Description BLOOD HEMODIALYSIS GRAFT AVF   Final   Special Requests BOTTLES DRAWN AEROBIC AND ANAEROBIC 10CC   Final   Culture  Setup Time     Final   Value: 05/25/2014 19:18     Performed at [facility]   Culture     Final   Value: NO GROWTH 5 DAYS     Performed at [facility]   Report Status 05/31/2014 FINAL   Final  CULTURE, BLOOD (ROUTINE X 2)     Status: None   Collection Time    05/25/14  3:15 PM      Result Value Ref Range Status   Specimen Description HEMODIALYSIS GRAFT   Final   Special Requests BOTTLES DRAWN AEROBIC AND ANAEROBIC 10CC AVF   Final   Culture  Setup Time     Final   Value: 05/25/2014 19:22     Performed at [facility]   Culture     Final   Value: NO GROWTH 5 DAYS     Performed at [facility]   Report Status 05/31/2014 FINAL   Final     Studies: Mr Hip Right Wo Contrast  05/31/2014   CLINICAL DATA:  73 y.o.with prior h/o ESRD on HD, spinal stenosis, seizure, tremors was brought in for confusion. In ER, he was noted to have right sided pneumonia and a urinary tract infection with metabolic encephalopathy. Subsequently noted to have MSSA bacteremia, ID following, now on Ancef. Underwent TEE, negative for endocarditis. ID also recommended R  hip MRI due to mild hip pain,  EXAM: MR OF THE RIGHT HIP WITHOUT CONTRAST  TECHNIQUE: Multiplanar, multisequence MR imaging was performed. No intravenous contrast was administered.  COMPARISON:  None.  FINDINGS: Bones: No marrow signal abnormality. No fracture, dislocation or avascular necrosis. Bilateral SI joints are unremarkable. Degenerative disc disease of the lower lumbar spine partially visualized.  Articular cartilage and labrum  Articular cartilage: Partial thickness cartilage loss of the superior femoral head and superior acetabulum bilaterally, left greater than right.  Labrum:  Right labral tear with a small paralabral cyst.  Joint or bursal effusion  Joint effusion:  No significant joint effusion.  Bursae:  No bursa formation.  Muscles and tendons  Muscles and tendons: Mild edema in the gluteus maximus muscle likely reflecting muscle strain. There is mild right gluteus minimus insertional tendinosis. The iliopsoas tendons are intact. The hamstring origins are intact. The adductor are intact. The gluteal tendons are intact. There is relative increased T2 signal within  bilateral quadriceps musculature which is incompletely imaged and may reflect nonspecific myositis secondary to a posttraumatic versus infectious versus inflammatory etiology.  Other findings  Miscellaneous:  No focal intrapelvic abnormality.  IMPRESSION: 1. Moderate left hip osteoarthritis. 2. Moderate right hip osteoarthritis. 3. Mild gluteus minimus insertional tendinosis. 4. Mild edema signal within bilateral quadriceps musculature which is incompletely imaged and may reflect nonspecific myositis secondary to a posttraumatic versus infectious versus inflammatory etiology.   Electronically Signed   By: Kathreen Devoid   On: 05/31/2014 11:01    Scheduled Meds: . aspirin EC  325 mg Oral Daily  . atorvastatin  20 mg Oral QHS  . carbidopa-levodopa  0.5 tablet Oral TID  .  ceFAZolin (ANCEF) IV  2 g Intravenous Q T,Th,Sat-1800  .  cinacalcet  30 mg Oral QPC supper  . darbepoetin (ARANESP) injection - DIALYSIS  60 mcg Intravenous Q Thu-HD  . divalproex  1,500 mg Oral Daily  . enoxaparin (LOVENOX) injection  30 mg Subcutaneous Q24H  . escitalopram  5 mg Oral Daily  . feeding supplement (NEPRO CARB STEADY)  237 mL Oral TID WC  . hydrocortisone   Rectal BID  . Influenza vac split quadrivalent PF  0.5 mL Intramuscular Tomorrow-1000  . metoprolol succinate  12.5 mg Oral QHS  . multivitamin  1 tablet Oral QHS  . sevelamer carbonate  3,200 mg Oral TID WC  . sucroferric oxyhydroxide  500 mg Oral Q supper   Continuous Infusions: . sodium chloride      Active Problems:   HCAP (healthcare-associated pneumonia)    Time spent: >35 minutes     Kinnie Feil  [facility] Pager [phone]. If 7PM-7AM, please contact night-coverage at [email or web address], password [facility] 05/31/2014, 2:09 PM  LOS: 8 days

## 2014-05-31 NOTE — Progress Notes (Signed)
Subjective:   No complaints. Just back from MRI  Objective Filed Vitals:   05/31/14 0549 05/31/14 1108 05/31/14 1115 05/31/14 1130  BP: 121/58  102/33 115/37  Pulse: 84 92 90 95  Temp: 98.9 F (37.2 C) 97.2 F (36.2 C)  97.4 F (36.3 C)  TempSrc: Oral     Resp: 20 12 15    Height:      Weight:      SpO2: 97% 99% 99% 99%   Physical Exam General: Alert and oriented. No acute distress Heart: RRR. No murmur  Lungs: CTA, unlabored  Abdomen: soft, nontender +BS  Extremities: no edema Dialysis Access:  L AVF s/p fistulogram andrevision 9/13 w Dr. Bridgett Larsson +b/t  [facility] [facility]  4 hr 30 mins 118kg 2K/2Ca+ 350/8000 6000 Heparin L AVF  hectorol 4 mcg Aranesp 50 q week No Fe    Assessment/Plan: 1. AMS- improving, due to infection  2. PNA- R infiltrate on xray off Cefepime and Vanc - changed to Ancef - Blood cultures- staph aureus. Afebrile.  3. UTI- culture - Ecoli. antibx per pharm. Has had AMS in the past (may 2015) secondary to UTI, +Ecoli and was treated with Rocephin- has condom cath 4.  MSSA bacteremia- on Ancef ID evaluating, he did have a stitch abcess on the AVF the day of admission which drained a small amt of pus. for TEE 9/14- no vegetations. R [name] MRI 9/16- mild edema w/i quad musculature may reflect myositis secondary posttraumatic vs infectious vs inflammatory. 5. Tremor- Head CT- no acute event on 9/4. Follows with neurology, last appt 8/31- continue divalproex for seizure and carb/levo for tremor  6. ESRD - TTS @ [place]- next HD tomorrow Hypertension/volume - . 1315/37 Under edw  7. Anemia -hgb 8.8. ESA  given 9/8, cont weekly. No Fe, last tsat 28.- 8. Metabolic bone disease - ^ corr Ca P 6.1. Hold hectorol. 2 Ca+ bath. - last phos 8.9 (8/27) PTH 391 (7/23) Cont binders - renvela and velphoro Will add sensipar since holding hectoral which should help with both Ca and phos. 9. Nutrition - alb 2.3. Multi vit renal diet.  10. AVF bleeding- s/p fistulogram and  revision 9.13 with dr Bridgett Larsson Tissue defect in AVF repaired by Dr Bridgett Larsson, and also had PTA of venous stenoses upstream. Appreciate VVS assistance and per Dr. Bridgett Larsson, ok to stick proximally to revision.  11. Spinal stenosis Dispo- back to SNF when stable    Shelle Iron, NP [place] [phone] 05/31/2014,12:11 PM  LOS: 8 days    Additional Objective Labs: Basic Metabolic Panel:  Recent Labs Lab 05/26/14 1239 05/27/14 0759 05/28/14 1640 05/31/14 0856  NA 135* 136* 134* 138  K 4.8 4.4 4.9 4.6  CL 91* 93* 90*  --   CO2 25 26 26   --   GLUCOSE 166* 155* 149* 102*  BUN 48* 69* 48*  --   CREATININE 7.03* 8.37* 6.66*  --   CALCIUM 10.0 9.6 9.6  --   PHOS  --  6.1*  --   --    Liver Function Tests:  Recent Labs Lab 05/27/14 0759  ALBUMIN 2.3*   No results found for this basename: LIPASE, AMYLASE,  in the last 168 hours CBC:  Recent Labs Lab 05/25/14 1249 05/26/14 1239 05/27/14 0500 05/28/14 1640 05/30/14 0852 05/31/14 0856  WBC 17.7* 12.3* 11.3* 13.9* 12.9*  --   HGB 10.0* 9.2* 8.4* 9.2* 8.3* 8.8*  HCT 30.0* 27.7* 25.2* 27.7* 24.4* 26.0*  MCV 100.3* 99.3 101.2* 98.9 97.2  --   PLT 201 331 279 389 338  --    Blood Culture    Component Value Date/Time   SDES HEMODIALYSIS GRAFT 05/25/2014 1515   SPECREQUEST BOTTLES DRAWN AEROBIC AND ANAEROBIC 10CC AVF 05/25/2014 1515   CULT  Value: NO GROWTH 5 DAYS Performed at [place] 05/25/2014 1515   REPTSTATUS 05/31/2014 FINAL 05/25/2014 1515    Cardiac Enzymes: No results found for this basename: CKTOTAL, CKMB, CKMBINDEX, TROPONINI,  in the last 168 hours CBG:  Recent Labs Lab 05/28/14 1130  GLUCAP 139*   Iron Studies: No results found for this basename: IRON, TIBC, TRANSFERRIN, FERRITIN,  in the last 72 hours @lablastinr3 @ Studies/Results: [name] [name] Right Wo Contrast  05/31/2014   CLINICAL DATA:  73 y.o.with prior h/o ESRD on HD, spinal stenosis, seizure, tremors was brought in for  confusion. In ER, he was noted to have right sided pneumonia and a urinary tract infection with metabolic encephalopathy. Subsequently noted to have MSSA bacteremia, ID following, now on Ancef. Underwent TEE, negative for endocarditis. ID also recommended R [name] MRI due to mild [name] pain,  EXAM: [name] OF THE RIGHT [name] WITHOUT CONTRAST  TECHNIQUE: Multiplanar, multisequence [name] imaging was performed. No intravenous contrast was administered.  COMPARISON:  None.  FINDINGS: Bones: No marrow signal abnormality. No fracture, dislocation or avascular necrosis. Bilateral SI joints are unremarkable. Degenerative disc disease of the lower lumbar spine partially visualized.  Articular cartilage and labrum  Articular cartilage: Partial thickness cartilage loss of the superior femoral head and superior acetabulum bilaterally, left greater than right.  Labrum:  Right labral tear with a small paralabral cyst.  Joint or bursal effusion  Joint effusion:  No significant joint effusion.  Bursae:  No bursa formation.  Muscles and tendons  Muscles and tendons: Mild edema in the gluteus maximus muscle likely reflecting muscle strain. There is mild right gluteus minimus insertional tendinosis. The iliopsoas tendons are intact. The hamstring origins are intact. The adductor are intact. The gluteal tendons are intact. There is relative increased T2 signal within bilateral quadriceps musculature which is incompletely imaged and may reflect nonspecific myositis secondary to a posttraumatic versus infectious versus inflammatory etiology.  Other findings  Miscellaneous:  No focal intrapelvic abnormality.  IMPRESSION: 1. Moderate left [name] osteoarthritis. 2. Moderate right [name] osteoarthritis. 3. Mild gluteus minimus insertional tendinosis. 4. Mild edema signal within bilateral quadriceps musculature which is incompletely imaged and may reflect nonspecific myositis secondary to a posttraumatic versus infectious versus inflammatory etiology.    Electronically Signed   By: Kathreen Devoid   On: 05/31/2014 11:01   Medications: . sodium chloride     . aspirin EC  325 mg Oral Daily  . atorvastatin  20 mg Oral QHS  . carbidopa-levodopa  0.5 tablet Oral TID  .  ceFAZolin (ANCEF) IV  2 g Intravenous Q T,Th,Sat-1800  . darbepoetin (ARANESP) injection - DIALYSIS  60 mcg Intravenous Q Thu-HD  . divalproex  1,500 mg Oral Daily  . enoxaparin (LOVENOX) injection  30 mg Subcutaneous Q24H  . escitalopram  5 mg Oral Daily  . feeding supplement (NEPRO CARB STEADY)  237 mL Oral TID WC  . hydrocortisone   Rectal BID  . Influenza vac split quadrivalent PF  0.5 mL Intramuscular Tomorrow-1000  . metoprolol succinate  12.5 mg Oral QHS  . multivitamin  1 tablet Oral QHS  . sevelamer carbonate  3,200 mg Oral TID WC  . sucroferric oxyhydroxide  500 mg Oral Q supper

## 2014-05-31 NOTE — Transfer of Care (Signed)
Immediate Anesthesia Transfer of Care Note  Patient: [name] [name]  Procedure(s) Performed: Procedure(s) with comments: MRI WITH ANESTHESIA, Left hip (Left) - Dr Jacinta Shoe ordered  Patient Location: PACU  Anesthesia Type:General  Level of Consciousness: awake, alert  and oriented  Airway & Oxygen Therapy: Patient Spontanous Breathing and Patient connected to nasal cannula oxygen  Post-op Assessment: Report given to PACU RN, Post -op Vital signs reviewed and stable and Patient moving all extremities X 4  Post vital signs: Reviewed and stable  Complications: No apparent anesthesia complications

## 2014-05-31 NOTE — Progress Notes (Signed)
INFECTIOUS DISEASE PROGRESS NOTE  ID: [name] [name] is a 73 y.o. male with  Active Problems:   HCAP (healthcare-associated pneumonia)  Subjective: "Are you Dr Johnnye Sima?"  Abtx:  Anti-infectives   Start     Dose/Rate Route Frequency Ordered Stop   05/30/14 1800  ceFAZolin (ANCEF) IVPB 2 g/50 mL premix     2 g 100 mL/hr over 30 Minutes Intravenous Every T-Th-Sa (1800) 05/30/14 0817     05/30/14 1200  ceFAZolin (ANCEF) IVPB 2 g/50 mL premix  Status:  Discontinued     2 g 100 mL/hr over 30 Minutes Intravenous Every T-Th-Sa (Hemodialysis) 05/29/14 1045 05/30/14 0817   05/27/14 1200  ceFAZolin (ANCEF) IVPB 2 g/50 mL premix     2 g 100 mL/hr over 30 Minutes Intravenous Every T-Th-Sa (Hemodialysis) 05/26/14 1559 05/27/14 1223   05/25/14 1800  ceFEPIme (MAXIPIME) 2 g in dextrose 5 % 50 mL IVPB  Status:  Discontinued     2 g 100 mL/hr over 30 Minutes Intravenous Every T-Th-Sa (1800) 05/24/14 1023 05/26/14 1434   05/25/14 1200  vancomycin (VANCOCIN) IVPB 1000 mg/200 mL premix  Status:  Discontinued     1,000 mg 200 mL/hr over 60 Minutes Intravenous Every T-Th-Sa (Hemodialysis) 05/23/14 1729 05/23/14 1752   05/25/14 1200  vancomycin (VANCOCIN) IVPB 1000 mg/200 mL premix  Status:  Discontinued     1,000 mg 200 mL/hr over 60 Minutes Intravenous Every T-Th-Sa (Hemodialysis) 05/23/14 1854 05/26/14 1434   05/25/14 1200  ceFEPIme (MAXIPIME) 2 g in dextrose 5 % 50 mL IVPB  Status:  Discontinued     2 g 100 mL/hr over 30 Minutes Intravenous Every T-Th-Sa (Hemodialysis) 05/23/14 1855 05/24/14 1023   05/23/14 2100  ceFEPIme (MAXIPIME) 2 g in dextrose 5 % 50 mL IVPB  Status:  Discontinued     2 g 100 mL/hr over 30 Minutes Intravenous Every T-Th-Sa (Hemodialysis) 05/23/14 1751 05/23/14 1855   05/23/14 2100  vancomycin (VANCOCIN) IVPB 1000 mg/200 mL premix  Status:  Discontinued     1,000 mg 200 mL/hr over 60 Minutes Intravenous Every T-Th-Sa (Hemodialysis) 05/23/14 1752 05/23/14 1854   05/23/14  1930  vancomycin (VANCOCIN) IVPB 1000 mg/200 mL premix  Status:  Discontinued     1,000 mg 200 mL/hr over 60 Minutes Intravenous To Hemodialysis 05/23/14 1854 05/23/14 1856   05/23/14 1930  ceFEPIme (MAXIPIME) 2 g in dextrose 5 % 50 mL IVPB     2 g 100 mL/hr over 30 Minutes Intravenous To Hemodialysis 05/23/14 1855 05/23/14 2318   05/23/14 1900  ceFEPIme (MAXIPIME) 2 g in dextrose 5 % 50 mL IVPB  Status:  Discontinued     2 g 100 mL/hr over 30 Minutes Intravenous Every T-Th-Sa (Hemodialysis) 05/23/14 1537 05/23/14 1751   05/23/14 1900  vancomycin (VANCOCIN) IVPB 1000 mg/200 mL premix  Status:  Discontinued     1,000 mg 200 mL/hr over 60 Minutes Intravenous Every T-Th-Sa (Hemodialysis) 05/23/14 1537 05/23/14 1729   05/23/14 1900  vancomycin (VANCOCIN) 2,500 mg in sodium chloride 0.9 % 500 mL IVPB     2,500 mg 250 mL/hr over 120 Minutes Intravenous To Hemodialysis 05/23/14 1856 05/24/14 0014   05/23/14 1830  vancomycin (VANCOCIN) 2,500 mg in sodium chloride 0.9 % 500 mL IVPB  Status:  Discontinued     2,500 mg 250 mL/hr over 120 Minutes Intravenous To Hemodialysis 05/23/14 1729 05/23/14 1853   05/23/14 1400  vancomycin (VANCOCIN) 2,500 mg in sodium chloride 0.9 % 500 mL IVPB  2,500 mg 250 mL/hr over 120 Minutes Intravenous  Once 05/23/14 1309 05/23/14 1530   05/23/14 1300  ceFEPIme (MAXIPIME) 1 g in dextrose 5 % 50 mL IVPB  Status:  Discontinued     1 g 100 mL/hr over 30 Minutes Intravenous  Once 05/23/14 1257 05/23/14 1535      Medications:  Scheduled: . aspirin EC  325 mg Oral Daily  . atorvastatin  20 mg Oral QHS  . carbidopa-levodopa  0.5 tablet Oral TID  .  ceFAZolin (ANCEF) IV  2 g Intravenous Q T,Th,Sat-1800  . cinacalcet  30 mg Oral QPC supper  . darbepoetin (ARANESP) injection - DIALYSIS  60 mcg Intravenous Q Thu-HD  . divalproex  1,500 mg Oral Daily  . enoxaparin (LOVENOX) injection  30 mg Subcutaneous Q24H  . escitalopram  5 mg Oral Daily  . feeding supplement (NEPRO  CARB STEADY)  237 mL Oral TID WC  . hydrocortisone   Rectal BID  . Influenza vac split quadrivalent PF  0.5 mL Intramuscular Tomorrow-1000  . metoprolol succinate  12.5 mg Oral QHS  . multivitamin  1 tablet Oral QHS  . sevelamer carbonate  3,200 mg Oral TID WC  . sucroferric oxyhydroxide  500 mg Oral Q supper    Objective: Vital signs in last 24 hours: Temp:  [97.2 F (36.2 C)-99.9 F (37.7 C)] 97.4 F (36.3 C) (09/16 1130) Pulse Rate:  [81-95] 95 (09/16 1130) Resp:  [12-20] 15 (09/16 1115) BP: (97-121)/(33-58) 115/37 mmHg (09/16 1130) SpO2:  [97 %-99 %] 99 % (09/16 1130) Weight:  [113.3 kg (249 lb 12.5 oz)] 113.3 kg (249 lb 12.5 oz) (09/15 2100)   General appearance: alert and no distress Resp: clear to auscultation bilaterally Cardio: regular rate and rhythm GI: normal findings: bowel sounds normal and soft, non-tender  Lab Results  Recent Labs  05/28/14 1640 05/30/14 0852 05/31/14 0856  WBC 13.9* 12.9*  --   HGB 9.2* 8.3* 8.8*  HCT 27.7* 24.4* 26.0*  NA 134*  --  138  K 4.9  --  4.6  CL 90*  --   --   CO2 26  --   --   BUN 48*  --   --   CREATININE 6.66*  --   --    Liver Panel No results found for this basename: PROT, ALBUMIN, AST, ALT, ALKPHOS, BILITOT, BILIDIR, IBILI,  in the last 72 hours Sedimentation Rate No results found for this basename: ESRSEDRATE,  in the last 72 hours C-Reactive Protein No results found for this basename: CRP,  in the last 72 hours  Microbiology: Recent Results (from the past 240 hour(s))  URINE CULTURE     Status: None   Collection Time    05/23/14 11:50 AM      Result Value Ref Range Status   Specimen Description URINE, RANDOM   Final   Special Requests NONE   Final   Culture  Setup Time     Final   Value: 05/23/2014 17:20     Performed at [facility]     Final   Value: >=100,000 COLONIES/ML     Performed at [facility]   Culture     Final   Value: ESCHERICHIA COLI     Performed at  [facility]   Report Status 05/26/2014 FINAL   Final   Organism ID, Bacteria ESCHERICHIA COLI   Final  CULTURE, BLOOD (ROUTINE X 2)     Status:  None   Collection Time    05/23/14  1:17 PM      Result Value Ref Range Status   Specimen Description BLOOD HAND RIGHT   Final   Special Requests BOTTLES DRAWN AEROBIC ONLY 3CC   Final   Culture  Setup Time     Final   Value: 05/23/2014 16:42     Performed at [facility]   Culture     Final   Value: STAPHYLOCOCCUS AUREUS     Note: RIFAMPIN AND GENTAMICIN SHOULD NOT BE USED AS SINGLE DRUGS FOR TREATMENT OF STAPH INFECTIONS.     Note: Gram Stain Report Called to,Read Back By and Verified With: BONITA@12 :50PM ON 05/24/14 BY DANTS     Performed at [facility]   Report Status 05/26/2014 FINAL   Final   Organism ID, Bacteria STAPHYLOCOCCUS AUREUS   Final  CULTURE, BLOOD (ROUTINE X 2)     Status: None   Collection Time    05/23/14  2:15 PM      Result Value Ref Range Status   Specimen Description BLOOD RIGHT HAND   Final   Special Requests BOTTLES DRAWN AEROBIC AND ANAEROBIC 5CC   Final   Culture  Setup Time     Final   Value: 05/23/2014 18:52     Performed at [facility]   Culture     Final   Value: STAPHYLOCOCCUS AUREUS     Note: SUSCEPTIBILITIES PERFORMED ON PREVIOUS CULTURE WITHIN THE LAST 5 DAYS.     Note: Gram Stain Report Called to,Read Back By and Verified With: JULIA OSBORNE ON 05/24/2014 AT 8:52P BY WILEJ     Performed at [facility]   Report Status 05/26/2014 FINAL   Final  MRSA PCR SCREENING     Status: None   Collection Time    05/23/14  3:49 PM      Result Value Ref Range Status   MRSA by PCR NEGATIVE  NEGATIVE Final   Comment:            The GeneXpert MRSA Assay (FDA     approved for NASAL specimens     only), is one component of a     comprehensive MRSA colonization     surveillance program. It is not     intended to diagnose MRSA     infection nor to guide or     monitor  treatment for     MRSA infections.  CULTURE, BLOOD (ROUTINE X 2)     Status: None   Collection Time    05/25/14  3:05 PM      Result Value Ref Range Status   Specimen Description BLOOD HEMODIALYSIS GRAFT AVF   Final   Special Requests BOTTLES DRAWN AEROBIC AND ANAEROBIC 10CC   Final   Culture  Setup Time     Final   Value: 05/25/2014 19:18     Performed at [facility]   Culture     Final   Value: NO GROWTH 5 DAYS     Performed at [facility]   Report Status 05/31/2014 FINAL   Final  CULTURE, BLOOD (ROUTINE X 2)     Status: None   Collection Time    05/25/14  3:15 PM      Result Value Ref Range Status   Specimen Description HEMODIALYSIS GRAFT   Final   Special Requests BOTTLES DRAWN AEROBIC AND ANAEROBIC 10CC AVF   Final   Culture  Setup  Time     Final   Value: 05/25/2014 19:22     Performed at [facility]   Culture     Final   Value: NO GROWTH 5 DAYS     Performed at [facility]   Report Status 05/31/2014 FINAL   Final    Studies/Results: Mr Hip Right Wo Contrast  05/31/2014   CLINICAL DATA:  73 y.o.with prior h/o ESRD on HD, spinal stenosis, seizure, tremors was brought in for confusion. In ER, he was noted to have right sided pneumonia and a urinary tract infection with metabolic encephalopathy. Subsequently noted to have MSSA bacteremia, ID following, now on Ancef. Underwent TEE, negative for endocarditis. ID also recommended R hip MRI due to mild hip pain,  EXAM: MR OF THE RIGHT HIP WITHOUT CONTRAST  TECHNIQUE: Multiplanar, multisequence MR imaging was performed. No intravenous contrast was administered.  COMPARISON:  None.  FINDINGS: Bones: No marrow signal abnormality. No fracture, dislocation or avascular necrosis. Bilateral SI joints are unremarkable. Degenerative disc disease of the lower lumbar spine partially visualized.  Articular cartilage and labrum  Articular cartilage: Partial thickness cartilage loss of the superior femoral head and  superior acetabulum bilaterally, left greater than right.  Labrum:  Right labral tear with a small paralabral cyst.  Joint or bursal effusion  Joint effusion:  No significant joint effusion.  Bursae:  No bursa formation.  Muscles and tendons  Muscles and tendons: Mild edema in the gluteus maximus muscle likely reflecting muscle strain. There is mild right gluteus minimus insertional tendinosis. The iliopsoas tendons are intact. The hamstring origins are intact. The adductor are intact. The gluteal tendons are intact. There is relative increased T2 signal within bilateral quadriceps musculature which is incompletely imaged and may reflect nonspecific myositis secondary to a posttraumatic versus infectious versus inflammatory etiology.  Other findings  Miscellaneous:  No focal intrapelvic abnormality.  IMPRESSION: 1. Moderate left hip osteoarthritis. 2. Moderate right hip osteoarthritis. 3. Mild gluteus minimus insertional tendinosis. 4. Mild edema signal within bilateral quadriceps musculature which is incompletely imaged and may reflect nonspecific myositis secondary to a posttraumatic versus infectious versus inflammatory etiology.   Electronically Signed   By: Kathreen Devoid   On: 05/31/2014 11:01     Assessment/Plan: Bacteremia/MSSA  E coli (pan-sens) in UCx  ESRD  Spinal Pain- hx of cervical spondylosis  R hip pain- ? myositis   Total days of antibiotics: 9 (ancef)  Would aim for 18 more days of ancef due to ? Of myositis on his MRI.  He can get this at HD Available if questions.         [name] Infectious Diseases (pager) [phone] [email or web address] 05/31/2014, 4:33 PM  LOS: 8 days

## 2014-05-31 NOTE — Progress Notes (Signed)
Note/chart reviewed.  Katie Tijana Walder, RD, LDN Pager #: [phone] After-Hours Pager #: [phone]  

## 2014-05-31 NOTE — Anesthesia Postprocedure Evaluation (Signed)
  Anesthesia Post-op Note  Patient: [name] [name]  Procedure(s) Performed: Procedure(s) with comments: MRI WITH ANESTHESIA, Left hip (Left) - Dr Jacinta Shoe ordered  Patient Location: PACU  Anesthesia Type:General  Level of Consciousness: awake and oriented  Airway and Oxygen Therapy: Patient Spontanous Breathing and Patient connected to nasal cannula oxygen  Post-op Pain: none  Post-op Assessment: Post-op Vital signs reviewed and Respiratory Function Stable  Post-op Vital Signs: Reviewed and stable  Last Vitals:  Filed Vitals:   05/31/14 1108  BP:   Pulse: 92  Temp: 36.2 C  Resp: 12    Complications: No apparent anesthesia complications

## 2014-06-01 ENCOUNTER — Encounter (HOSPITAL_COMMUNITY): Payer: Self-pay | Admitting: Radiology

## 2014-06-01 DIAGNOSIS — I10 Essential (primary) hypertension: Secondary | ICD-10-CM

## 2014-06-01 LAB — CBC
HCT: 24.7 % — ABNORMAL LOW (ref 39.0–52.0)
Hemoglobin: 8 g/dL — ABNORMAL LOW (ref 13.0–17.0)
MCH: 32.1 pg (ref 26.0–34.0)
MCHC: 32.4 g/dL (ref 30.0–36.0)
MCV: 99.2 fL (ref 78.0–100.0)
Platelets: 343 10*3/uL (ref 150–400)
RBC: 2.49 MIL/uL — ABNORMAL LOW (ref 4.22–5.81)
RDW: 13.9 % (ref 11.5–15.5)
WBC: 7.8 10*3/uL (ref 4.0–10.5)

## 2014-06-01 LAB — RENAL FUNCTION PANEL
Albumin: 2.4 g/dL — ABNORMAL LOW (ref 3.5–5.2)
Anion gap: 17 — ABNORMAL HIGH (ref 5–15)
BUN: 38 mg/dL — AB (ref 6–23)
CALCIUM: 9.4 mg/dL (ref 8.4–10.5)
CHLORIDE: 94 meq/L — AB (ref 96–112)
CO2: 27 meq/L (ref 19–32)
CREATININE: 7.87 mg/dL — AB (ref 0.50–1.35)
GFR calc Af Amer: 7 mL/min — ABNORMAL LOW (ref >90)
GFR calc non Af Amer: 6 mL/min — ABNORMAL LOW (ref >90)
Glucose, Bld: 156 mg/dL — ABNORMAL HIGH (ref 70–99)
Phosphorus: 6.1 mg/dL — ABNORMAL HIGH (ref 2.3–4.6)
Potassium: 4.2 mEq/L (ref 3.7–5.3)
Sodium: 138 mEq/L (ref 137–147)

## 2014-06-01 MED ORDER — NEPRO/CARBSTEADY PO LIQD: 237.0000 mL | ORAL | Status: DC | PRN

## 2014-06-01 MED ORDER — HYDROCORTISONE 2.5 % RE CREA: TOPICAL_CREAM | Freq: Two times a day (BID) | RECTAL | Status: DC

## 2014-06-01 MED ORDER — OXYCODONE-ACETAMINOPHEN 5-325 MG PO TABS: 1.0000 | ORAL_TABLET | Freq: Four times a day (QID) | ORAL | Status: DC | PRN

## 2014-06-01 MED ORDER — ESCITALOPRAM OXALATE 5 MG PO TABS: 5.0000 mg | ORAL_TABLET | Freq: Every day | ORAL | Status: DC

## 2014-06-01 MED ORDER — SODIUM CHLORIDE 0.9 % IV SOLN: 100.0000 mL | INTRAVENOUS | Status: DC | PRN

## 2014-06-01 MED ORDER — GABAPENTIN 300 MG PO CAPS: 300.0000 mg | ORAL_CAPSULE | Freq: Every day | ORAL | Status: DC

## 2014-06-01 MED ORDER — CEFAZOLIN SODIUM-DEXTROSE 2-3 GM-% IV SOLR: 2.0000 g | INTRAVENOUS | Status: AC

## 2014-06-01 MED ORDER — CINACALCET HCL 30 MG PO TABS: 30.0000 mg | ORAL_TABLET | Freq: Every day | ORAL | Status: DC

## 2014-06-01 MED ORDER — DARBEPOETIN ALFA-POLYSORBATE 60 MCG/0.3ML IJ SOLN: 60.0000 ug | INTRAMUSCULAR | Status: DC

## 2014-06-01 MED ORDER — HEPARIN SODIUM (PORCINE) 1000 UNIT/ML DIALYSIS: 1000.0000 [IU] | INTRAMUSCULAR | Status: DC | PRN

## 2014-06-01 MED ORDER — LIDOCAINE HCL (PF) 1 % IJ SOLN: 5.0000 mL | INTRAMUSCULAR | Status: DC | PRN

## 2014-06-01 MED ORDER — PENTAFLUOROPROP-TETRAFLUOROETH EX AERO: 1.0000 "application " | INHALATION_SPRAY | CUTANEOUS | Status: DC | PRN

## 2014-06-01 MED ORDER — MENTHOL 3 MG MT LOZG
1.0000 | LOZENGE | OROMUCOSAL | Status: DC | PRN
  Administered 2014-06-01: 3 mg via ORAL
  Filled 2014-06-01: qty 9

## 2014-06-01 MED ORDER — GUAIFENESIN 100 MG/5ML PO SYRP
200.0000 mg | ORAL_SOLUTION | ORAL | Status: DC | PRN
  Filled 2014-06-01: qty 10

## 2014-06-01 MED ORDER — LIDOCAINE-PRILOCAINE 2.5-2.5 % EX CREA
1.0000 "application " | TOPICAL_CREAM | CUTANEOUS | Status: DC | PRN
  Filled 2014-06-01: qty 5

## 2014-06-01 MED ORDER — HEPARIN SODIUM (PORCINE) 1000 UNIT/ML DIALYSIS: 6000.0000 [IU] | Freq: Once | INTRAMUSCULAR | Status: DC

## 2014-06-01 MED ORDER — GUAIFENESIN 100 MG/5ML PO SYRP: 200.0000 mg | ORAL_SOLUTION | ORAL | Status: DC | PRN

## 2014-06-01 MED ORDER — DARBEPOETIN ALFA-POLYSORBATE 60 MCG/0.3ML IJ SOLN
INTRAMUSCULAR | Status: AC
  Filled 2014-06-01: qty 0.3

## 2014-06-01 MED ORDER — ALTEPLASE 2 MG IJ SOLR
2.0000 mg | Freq: Once | INTRAMUSCULAR | Status: DC | PRN
  Filled 2014-06-01: qty 2

## 2014-06-01 NOTE — Progress Notes (Signed)
PT Cancellation Note  Patient Details Name: [name] [name] MRN: [id number] DOB: 1941-05-20   Cancelled Treatment:    Reason Eval/Treat Not Completed: Patient declined, no reason specified Pt declined participating in therapy after getting back from HD reporting he is "physically and emotionally drained." Explained importance of mobility however pt continued to refuse. Will follow up at next available time.   Candy Sledge A 06/01/2014, 2:01 PM Candy Sledge, [place], DPT [phone]

## 2014-06-01 NOTE — Progress Notes (Signed)
Called report to [employer].

## 2014-06-01 NOTE — Progress Notes (Signed)
Pt left floor via PTAR to [employer]

## 2014-06-01 NOTE — Procedures (Signed)
Patient was seen on dialysis and the procedure was supervised. BFR 400 Via LUE AVF BP is 107/52.  Patient appears to be tolerating treatment well.  Will need 17 more days of Ancef to cover MSSA bacteremia and cover for possible myositis per ID.

## 2014-06-01 NOTE — Clinical Social Work Placement (Addendum)
Clinical Social Work Department CLINICAL SOCIAL WORK PLACEMENT NOTE 06/01/2014  Patient:  [name] [name],[name] [name]  Account Number:  [id number] Admit date:  05/23/2014  Clinical Social Worker:  Rease Swinson Givens, LCSW  Date/time:  06/01/2014 03:55 AM  Clinical Social Work is seeking post-discharge placement for this patient at the following level of care:   [facility]   (*CSW will update this form in [facility] as items are completed)   05/31/2014  Patient/family provided with [facility] Department of Clinical Social Work's list of facilities offering this level of care within the geographic area requested by the patient (or if unable, by the patient's family).  05/31/2014  Patient/family informed of their freedom to choose among providers that offer the needed level of care, that participate in Medicare, Medicaid or managed care program needed by the patient, have an available bed and are willing to accept the patient.    Patient/family informed of [facility]' ownership interest in [facility], as well as of the fact that they are under no obligation to receive care at this facility.  PASARR submitted to EDS on 05/31/2014 PASARR number received on 05/31/2014  FL2 transmitted to all facilities in geographic area requested by pt/family on  05/31/2014 FL2 transmitted to all facilities within larger geographic area on   Patient informed that his/her managed care company has contracts with or will negotiate with  certain facilities, including the following:     Patient/family informed of bed offers received:  06/01/2014 Patient chooses bed at [facility] Physician recommends and patient chooses bed at    Patient to be transferred to [facility] on  06/01/2014 Patient to be transferred to facility by  Patient and family notified of transfer on 06/01/2014 Name of family member notified:  [name] Mcclatchey by phone at 6:07 pm  The following physician request were entered in  Epic:   Additional Comments:

## 2014-06-01 NOTE — Discharge Summary (Signed)
Physician Discharge Summary  [name] [name] DXI:[id number] DOB: 06/12/1941 DOA: 05/23/2014  PCP: Donetta Potts, MD  Admit date: 05/23/2014 Discharge date: 06/01/2014  Time spent: >35 minutes  Recommendations for Outpatient Follow-up:  SNF F/u with nephrology as scheduled for HD F/u with PCP in 1 week as needed   Discharge Diagnoses:  Active Problems:   HCAP (healthcare-associated pneumonia)   Discharge Condition: stable   Diet recommendation: low sodium   Filed Weights   05/30/14 2100 05/31/14 2031 06/01/14 0822  Weight: 113.3 kg (249 lb 12.5 oz) 112.6 kg (248 lb 3.8 oz) 113 kg (249 lb 1.9 oz)    History of present illness:  73 y.o.with prior h/o ESRD on HD, spinal stenosis, seizure d/o, tremors was brought in for confusion.  -In ER, he was noted to have right sided pneumonia and a urinary tract infection with Metabolic encephalopathy.  Subsequently noted to have MSSA bacteremia, ID following, now on Ancef.  Underwent TEE, negative for endocarditis.    Hospital Course:  1. MSSA bacteremia, PNA  - likely due to HCAP/PNA and or stitch abscesses on graft  - LUE AVF with stitch abscesses on graft, VVS aware, s/p revision 9/13, with fistulogram and repair  - continue Ancef per ID with HD, pharmacy to dose, start date 9/11; ID recommended to cont for 18 days last dose 02/17/14 - repeat Blood CX from 9/10 NGTD  - MRI R hip: DJD, myositis  - TEE negative for endocarditis  - repeat CXR in 6 weeks to confirm the resolution of pneumonia  2. Ecoli UTI  - pan sensitive, > 100K colonies  - completed 4days of cefepime, now on ancef  3. Metabolic encephalopathy ? Due to infection on top of due to 1,2, gabapentin, narcotics  -deceased Gabapentin, prn percocet for only severe pain  -encephalopathy resolved  4. Spinal stenosis and cervical spondylosis  -FU with Dr.Elsner  -Narcotics were on hold due to mentation  -resumed low dose due to severe chronic pain  5. Seizure d/o   -continue Depakote, level slightly low  6. Tremors  -continue carb/levo  7. ESRD  -HD per Renal  8. Hypertension/volume  -stable, continue Tprol  9. Anemia of chronic disease  -Aranesp with HD  10. AVF bleeding s/p fistulogram and revision 9.13 with dr Bridgett Larsson Tissue defect in AVF repaired by Dr Bridgett Larsson  -resolved  -per Renal/VVS     Procedures:  HD (i.e. Studies not automatically included, echos, thoracentesis, etc; not x-rays)  Consultations:  Nephrology, ID  Discharge Exam: Filed Vitals:   06/01/14 1000  BP: 93/38  Pulse: 84  Temp:   Resp:     General: alert Cardiovascular: s1,s2 rrr Respiratory: CTA BL  Discharge Instructions  Discharge Instructions   Diet - low sodium heart healthy    Complete by:  As directed      Discharge instructions    Complete by:  As directed   Please follow up with nephrologist as scheduled for dialysis     Increase activity slowly    Complete by:  As directed             Medication List         acetaminophen 325 MG tablet  Commonly known as:  TYLENOL  Take 325 mg by mouth every 6 (six) hours as needed for moderate pain or fever.     aspirin EC 325 MG tablet  Take 325 mg by mouth daily.     atorvastatin 20 MG tablet  Commonly known as:  LIPITOR  Take 20 mg by mouth at bedtime.     BIOFREEZE EX  Apply 1 application topically 4 (four) times daily as needed (For neck pain).     carbidopa-levodopa 25-100 MG per tablet  Commonly known as:  SINEMET IR  Take 0.5 tablets by mouth 3 (three) times daily. For dialysis days, give 45 mins prior to HD tx for restless leg syndrome     ceFAZolin 2-3 GM-% Solr  Commonly known as:  ANCEF  Inject 50 mLs (2 g total) into the vein every Tuesday, Thursday, and Saturday at 6 PM.     cinacalcet 30 MG tablet  Commonly known as:  SENSIPAR  Take 1 tablet (30 mg total) by mouth daily after supper.     darbepoetin 60 MCG/0.3ML Soln injection  Commonly known as:  ARANESP  Inject 0.3 mLs (60  mcg total) into the vein every Thursday with hemodialysis.     divalproex 500 MG DR tablet  Commonly known as:  DEPAKOTE  Take 3 tablets (1,500 mg total) by mouth daily.     escitalopram 5 MG tablet  Commonly known as:  LEXAPRO  Take 1 tablet (5 mg total) by mouth daily.     gabapentin 300 MG capsule  Commonly known as:  NEURONTIN  Take 1 capsule (300 mg total) by mouth at bedtime.     guaifenesin 100 MG/5ML syrup  Commonly known as:  ROBITUSSIN  Take 10 mLs (200 mg total) by mouth every 4 (four) hours as needed for congestion.     hydrocortisone 2.5 % rectal cream  Commonly known as:  ANUSOL-HC  Place rectally 2 (two) times daily.     metoprolol succinate 25 MG 24 hr tablet  Commonly known as:  TOPROL-XL  Take 12.5 mg by mouth at bedtime.     nitroGLYCERIN 0.4 MG SL tablet  Commonly known as:  NITROSTAT  Place 1 tablet (0.4 mg total) under the tongue every 5 (five) minutes x 3 doses as needed for chest pain.     omeprazole 20 MG tablet  Commonly known as:  PRILOSEC OTC  Take 20 mg by mouth daily as needed (heartburn).     oxyCODONE-acetaminophen 5-325 MG per tablet  Commonly known as:  PERCOCET/ROXICET  Take 1 tablet by mouth every 6 (six) hours as needed for severe pain.     RENO CAPS 1 MG Caps  Take 1 mg by mouth at bedtime.     senna-docusate 8.6-50 MG per tablet  Commonly known as:  Senokot-S  Take 1 tablet by mouth at bedtime as needed for mild constipation.     simethicone 80 MG chewable tablet  Commonly known as:  MYLICON  Chew 80 mg by mouth 2 (two) times daily as needed for flatulence.     Vitamin D3 1000 UNITS Caps  Take 1 capsule by mouth daily.       No Known Allergies     Follow-up Information   Follow up with COLADONATO,JOSEPH A, MD Today.   Specialty:  Nephrology   Contact information:   [facility] [id number] [phone]        The results of significant diagnostics from this hospitalization (including imaging,  microbiology, ancillary and laboratory) are listed below for reference.    Significant Diagnostic Studies: Dg Chest 2 View  05/23/2014   CLINICAL DATA:  Weakness  EXAM: CHEST  2 VIEW  COMPARISON:  01/15/2014  FINDINGS: Cardiac shadow is mildly enlarged. The lungs are hypoinflated. Right  basilar atelectasis is noted. Additionally some right mid lung infiltrate is seen. No acute bony abnormality is noted.  IMPRESSION: Right basilar atelectasis and right mid lung infiltrate.   Electronically Signed   By: Inez Catalina M.D.   On: 05/23/2014 12:26   Ct Head Wo Contrast  05/19/2014   CLINICAL DATA:  Tremors and headache with history of end-stage renal disease seizure disorder and diabetes  EXAM: CT HEAD WITHOUT CONTRAST  TECHNIQUE: Contiguous axial images were obtained from the base of the skull through the vertex without intravenous contrast.  COMPARISON:  Noncontrast CT scan of the brain dated Jan 15, 2014 and MRI of the brain of Jan 20, 2014  FINDINGS: There is stable encephalomalacia of much of the inferior right temporal lobe status post craniectomy. There is mild stable diffuse cerebral and cerebellar atrophy with compensatory ventriculomegaly. There is no shift of the midline. There is a subcentimeter lacune in the posterior limb of the left basal ganglia new since the previous studies. There is no acute intracranial hemorrhage nor evidence of acute ischemic change. The cerebellum and brainstem are unremarkable.  The observed paranasal sinuses and mastoid air cells are clear. There is no acute skull fracture.  IMPRESSION: 1. There is no acute ischemic or hemorrhagic event. There are chronic stable changes of small vessel ischemia. A subcentimeter lacunar infarction in the posterior limb of the left basal ganglia has appeared since the previous studies. 2. There are stable changes of encephalomalacia status post craniectomy in the right temple region.   Electronically Signed   By: David  Martinique   On: 05/19/2014  16:45   Mr Hip Right Wo Contrast  05/31/2014   CLINICAL DATA:  73 y.o.with prior h/o ESRD on HD, spinal stenosis, seizure, tremors was brought in for confusion. In ER, he was noted to have right sided pneumonia and a urinary tract infection with metabolic encephalopathy. Subsequently noted to have MSSA bacteremia, ID following, now on Ancef. Underwent TEE, negative for endocarditis. ID also recommended R hip MRI due to mild hip pain,  EXAM: MR OF THE RIGHT HIP WITHOUT CONTRAST  TECHNIQUE: Multiplanar, multisequence MR imaging was performed. No intravenous contrast was administered.  COMPARISON:  None.  FINDINGS: Bones: No marrow signal abnormality. No fracture, dislocation or avascular necrosis. Bilateral SI joints are unremarkable. Degenerative disc disease of the lower lumbar spine partially visualized.  Articular cartilage and labrum  Articular cartilage: Partial thickness cartilage loss of the superior femoral head and superior acetabulum bilaterally, left greater than right.  Labrum:  Right labral tear with a small paralabral cyst.  Joint or bursal effusion  Joint effusion:  No significant joint effusion.  Bursae:  No bursa formation.  Muscles and tendons  Muscles and tendons: Mild edema in the gluteus maximus muscle likely reflecting muscle strain. There is mild right gluteus minimus insertional tendinosis. The iliopsoas tendons are intact. The hamstring origins are intact. The adductor are intact. The gluteal tendons are intact. There is relative increased T2 signal within bilateral quadriceps musculature which is incompletely imaged and may reflect nonspecific myositis secondary to a posttraumatic versus infectious versus inflammatory etiology.  Other findings  Miscellaneous:  No focal intrapelvic abnormality.  IMPRESSION: 1. Moderate left hip osteoarthritis. 2. Moderate right hip osteoarthritis. 3. Mild gluteus minimus insertional tendinosis. 4. Mild edema signal within bilateral quadriceps musculature  which is incompletely imaged and may reflect nonspecific myositis secondary to a posttraumatic versus infectious versus inflammatory etiology.   Electronically Signed   By: Kathreen Devoid  On: 05/31/2014 11:01    Microbiology: Recent Results (from the past 240 hour(s))  URINE CULTURE     Status: None   Collection Time    05/23/14 11:50 AM      Result Value Ref Range Status   Specimen Description URINE, RANDOM   Final   Special Requests NONE   Final   Culture  Setup Time     Final   Value: 05/23/2014 17:20     Performed at [facility]     Final   Value: >=100,000 COLONIES/ML     Performed at [facility]   Culture     Final   Value: ESCHERICHIA COLI     Performed at [facility]   Report Status 05/26/2014 FINAL   Final   Organism ID, Bacteria ESCHERICHIA COLI   Final  CULTURE, BLOOD (ROUTINE X 2)     Status: None   Collection Time    05/23/14  1:17 PM      Result Value Ref Range Status   Specimen Description BLOOD HAND RIGHT   Final   Special Requests BOTTLES DRAWN AEROBIC ONLY 3CC   Final   Culture  Setup Time     Final   Value: 05/23/2014 16:42     Performed at [facility]   Culture     Final   Value: STAPHYLOCOCCUS AUREUS     Note: RIFAMPIN AND GENTAMICIN SHOULD NOT BE USED AS SINGLE DRUGS FOR TREATMENT OF STAPH INFECTIONS.     Note: Gram Stain Report Called to,Read Back By and Verified With: BONITA@12 :50PM ON 05/24/14 BY DANTS     Performed at [facility]   Report Status 05/26/2014 FINAL   Final   Organism ID, Bacteria STAPHYLOCOCCUS AUREUS   Final  CULTURE, BLOOD (ROUTINE X 2)     Status: None   Collection Time    05/23/14  2:15 PM      Result Value Ref Range Status   Specimen Description BLOOD RIGHT HAND   Final   Special Requests BOTTLES DRAWN AEROBIC AND ANAEROBIC 5CC   Final   Culture  Setup Time     Final   Value: 05/23/2014 18:52     Performed at [facility]   Culture     Final   Value:  STAPHYLOCOCCUS AUREUS     Note: SUSCEPTIBILITIES PERFORMED ON PREVIOUS CULTURE WITHIN THE LAST 5 DAYS.     Note: Gram Stain Report Called to,Read Back By and Verified With: JULIA OSBORNE ON 05/24/2014 AT 8:52P BY WILEJ     Performed at [facility]   Report Status 05/26/2014 FINAL   Final  MRSA PCR SCREENING     Status: None   Collection Time    05/23/14  3:49 PM      Result Value Ref Range Status   MRSA by PCR NEGATIVE  NEGATIVE Final   Comment:            The GeneXpert MRSA Assay (FDA     approved for NASAL specimens     only), is one component of a     comprehensive MRSA colonization     surveillance program. It is not     intended to diagnose MRSA     infection nor to guide or     monitor treatment for     MRSA infections.  CULTURE, BLOOD (ROUTINE X 2)     Status: None   Collection Time  05/25/14  3:05 PM      Result Value Ref Range Status   Specimen Description BLOOD HEMODIALYSIS GRAFT AVF   Final   Special Requests BOTTLES DRAWN AEROBIC AND ANAEROBIC 10CC   Final   Culture  Setup Time     Final   Value: 05/25/2014 19:18     Performed at [facility]   Culture     Final   Value: NO GROWTH 5 DAYS     Performed at [facility]   Report Status 05/31/2014 FINAL   Final  CULTURE, BLOOD (ROUTINE X 2)     Status: None   Collection Time    05/25/14  3:15 PM      Result Value Ref Range Status   Specimen Description HEMODIALYSIS GRAFT   Final   Special Requests BOTTLES DRAWN AEROBIC AND ANAEROBIC 10CC AVF   Final   Culture  Setup Time     Final   Value: 05/25/2014 19:22     Performed at [facility]   Culture     Final   Value: NO GROWTH 5 DAYS     Performed at [facility]   Report Status 05/31/2014 FINAL   Final     Labs: Basic Metabolic Panel:  Recent Labs Lab 05/25/14 1249 05/26/14 1239 05/27/14 0759 05/28/14 1640 05/31/14 0856 06/01/14 0500  NA 138 135* 136* 134* 138 138  K 5.3 4.8 4.4 4.9 4.6 4.2  CL 92* 91* 93* 90*   --  94*  CO2 24 25 26 26   --  27  GLUCOSE 199* 166* 155* 149* 102* 156*  BUN 79* 48* 69* 48*  --  38*  CREATININE 10.50* 7.03* 8.37* 6.66*  --  7.87*  CALCIUM 10.1 10.0 9.6 9.6  --  9.4  PHOS  --   --  6.1*  --   --  6.1*   Liver Function Tests:  Recent Labs Lab 05/27/14 0759 06/01/14 0500  ALBUMIN 2.3* 2.4*   No results found for this basename: LIPASE, AMYLASE,  in the last 168 hours No results found for this basename: AMMONIA,  in the last 168 hours CBC:  Recent Labs Lab 05/26/14 1239 05/27/14 0500 05/28/14 1640 05/30/14 0852 05/31/14 0856 06/01/14 0850  WBC 12.3* 11.3* 13.9* 12.9*  --  7.8  HGB 9.2* 8.4* 9.2* 8.3* 8.8* 8.0*  HCT 27.7* 25.2* 27.7* 24.4* 26.0* 24.7*  MCV 99.3 101.2* 98.9 97.2  --  99.2  PLT 331 279 389 338  --  343   Cardiac Enzymes: No results found for this basename: CKTOTAL, CKMB, CKMBINDEX, TROPONINI,  in the last 168 hours BNP: BNP (last 3 results)  Recent Labs  01/15/14 1316  PROBNP 1399.0*   CBG:  Recent Labs Lab 05/28/14 1130 05/31/14 1110  GLUCAP 139* 98       Signed:  Dale Strausser N  [facility] 06/01/2014, 10:29 AM

## 2014-06-01 NOTE — Discharge Instructions (Signed)
Please continue follow up with nephrologist as scheduled

## 2014-06-01 NOTE — Progress Notes (Signed)
Pt reports congested cough and sore thoat, would like something for cough and sore throat.  Paged Dr. Daleen Bo

## 2014-06-01 NOTE — Progress Notes (Signed)
PT Cancellation Note  Patient Details Name: [name] [name] MRN: [id number] DOB: 1940-12-02   Cancelled Treatment:    Reason Eval/Treat Not Completed: Patient at procedure or test/unavailable Pt off floor at dialysis. Will attempt to follow up in PM if time allows.   Candy Sledge A 06/01/2014, 11:48 AM Candy Sledge, PT, DPT [phone]

## 2014-06-02 ENCOUNTER — Non-Acute Institutional Stay (SKILLED_NURSING_FACILITY): Payer: Medicare Other | Admitting: Adult Health

## 2014-06-02 ENCOUNTER — Encounter: Payer: Self-pay | Admitting: Adult Health

## 2014-06-02 DIAGNOSIS — G20 Parkinson's disease: Secondary | ICD-10-CM | POA: Insufficient documentation

## 2014-06-02 DIAGNOSIS — I10 Essential (primary) hypertension: Secondary | ICD-10-CM

## 2014-06-02 DIAGNOSIS — D631 Anemia in chronic kidney disease: Secondary | ICD-10-CM

## 2014-06-02 DIAGNOSIS — F3289 Other specified depressive episodes: Secondary | ICD-10-CM

## 2014-06-02 DIAGNOSIS — N039 Chronic nephritic syndrome with unspecified morphologic changes: Secondary | ICD-10-CM

## 2014-06-02 DIAGNOSIS — F329 Major depressive disorder, single episode, unspecified: Secondary | ICD-10-CM | POA: Insufficient documentation

## 2014-06-02 DIAGNOSIS — Z992 Dependence on renal dialysis: Secondary | ICD-10-CM

## 2014-06-02 DIAGNOSIS — J189 Pneumonia, unspecified organism: Secondary | ICD-10-CM

## 2014-06-02 DIAGNOSIS — G40909 Epilepsy, unspecified, not intractable, without status epilepticus: Secondary | ICD-10-CM

## 2014-06-02 DIAGNOSIS — N186 End stage renal disease: Secondary | ICD-10-CM

## 2014-06-02 DIAGNOSIS — F32A Depression, unspecified: Secondary | ICD-10-CM | POA: Insufficient documentation

## 2014-06-02 DIAGNOSIS — E785 Hyperlipidemia, unspecified: Secondary | ICD-10-CM

## 2014-06-02 DIAGNOSIS — N189 Chronic kidney disease, unspecified: Secondary | ICD-10-CM

## 2014-06-02 NOTE — Progress Notes (Signed)
CARE MANAGEMENT NOTE 06/02/2014  Patient:  [name] [name],[name] [name]   Account Number:  [id number]  Date Initiated:  05/25/2014  Documentation initiated by:  [facility]  Subjective/Objective Assessment:   HCAP     Action/Plan:   SNF   Anticipated DC Date:  05/27/2014   Anticipated DC Plan:  [place]  CM consult      Choice offered to / List presented to:             Status of service:  Completed, signed off Medicare Important Message given?  YES (If response is "NO", the following Medicare IM given date fields will be blank) Date Medicare IM given:  05/25/2014 Medicare IM given by:  [facility] Date Additional Medicare IM given:  05/31/2014 Additional Medicare IM given by:  Jonnie Finner  Discharge Disposition:  [place]  Per UR Regulation:  Reviewed for med. necessity/level of care/duration of stay  If discussed at [place] of Stay Meetings, dates discussed:   05/25/2014  05/30/2014  06/01/2014    Comments:  06/02/2014 1130 DC to SNF. Jonnie Finner RN CCM Case Mgmt phone [phone]    05/26/2014 1400 NCM spoke to wife, Zaden Sako, # [phone]. States the plan is dc back to SNF. Jonnie Finner RN CCM Case Mgmt phone [phone]

## 2014-06-02 NOTE — Progress Notes (Signed)
Patient ID: [name] [name], male   DOB: 1940-10-10, 73 y.o.   MRN: [id number]               PROGRESS NOTE  DATE: 06/02/2014  FACILITY: Nursing Home Location: [facility]  LEVEL OF CARE: SNF (31)  Acute Visit  CHIEF COMPLAINT:  Follow-up Hospitalization   HISTORY OF PRESENT ILLNESS: This is a 73 year old male who has been admitted to [facility] on 06/01/14 from [facility] with HCAP (healthcare associated pneumonia). He has been admitted for a short-term rehabilitation.  REASSESSMENT OF ONGOING PROBLEM(S):  SEIZURE DISORDER: The patient's seizure disorder remains stable. No complications reported from the medications presently being used. Staff do not report any recent seizure activity.  HTN: Pt 's HTN remains stable.  Denies CP, sob, DOE, pedal edema, headaches, dizziness or visual disturbances.  No complications from the medications currently being used.  Last BP :  120/75  DEPRESSION: The depression remains stable. Patient denies ongoing feelings of sadness, insomnia, anedhonia or lack of appetite. No complications reported from the medications currently being used. Staff do not report behavioral problems.  PAST MEDICAL HISTORY : Reviewed.  No changes/see problem list  CURRENT MEDICATIONS: Reviewed per MAR/see medication list  REVIEW OF SYSTEMS:  GENERAL: no change in appetite, no fatigue, no weight changes, no fever, chills or weakness RESPIRATORY: no cough, SOB, DOE, wheezing, hemoptysis CARDIAC: no chest pain, edema or palpitations GI: no abdominal pain, diarrhea, constipation, heart burn, nausea or vomiting  PHYSICAL EXAMINATION  GENERAL: no acute distress, normal body habitus EYES: conjunctivae normal, sclerae normal, normal eye lids NECK: supple, trachea midline, no neck masses, no thyroid tenderness, no thyromegaly LYMPHATICS: no LAN in the neck, no supraclavicular LAN RESPIRATORY: breathing is even & unlabored, BS CTAB CARDIAC: RRR, no  murmur,no extra heart sounds, no edema GI: abdomen soft, normal BS, no masses, no tenderness, no hepatomegaly, no splenomegaly EXTREMITIES:  Left BKA with prosthetic, left upper arm with AV shunt; able to move all 4 extremities PSYCHIATRIC: the patient is alert & oriented to person, affect & behavior appropriate  LABS/RADIOLOGY: Labs reviewed: Basic Metabolic Panel:  Recent Labs  01/24/14 0846  05/27/14 0759 05/28/14 1640 05/31/14 0856 06/01/14 0500  NA 129*  < > 136* 134* 138 138  K 5.0  < > 4.4 4.9 4.6 4.2  CL 85*  < > 93* 90*  --  94*  CO2 24  < > 26 26  --  27  GLUCOSE 197*  < > 155* 149* 102* 156*  BUN 82*  < > 69* 48*  --  38*  CREATININE 10.04*  < > 8.37* 6.66*  --  7.87*  CALCIUM 9.5  < > 9.6 9.6  --  9.4  PHOS 4.2  --  6.1*  --   --  6.1*  < > = values in this interval not displayed.  Liver Function Tests:  Recent Labs  01/15/14 1717 01/16/14 0405  05/23/14 1156 05/27/14 0759 06/01/14 0500  AST 16 17  --  12  --   --   ALT <5 <5  --  <5  --   --   ALKPHOS 56 56  --  66  --   --   BILITOT 0.3 0.2*  --  0.2*  --   --   PROT 8.0 7.1  --  7.7  --   --   ALBUMIN 2.9* 2.6*  < > 2.9* 2.3* 2.4*  < > = values  in this interval not displayed.  Recent Labs  01/15/14 1717  AMMONIA 30   CBC:  Recent Labs  01/16/14 0405  05/18/14 1333  05/28/14 1640 05/30/14 0852 05/31/14 0856 06/01/14 0850  WBC 10.4  < > 7.4  < > 13.9* 12.9*  --  7.8  NEUTROABS 5.5  --  4.1  --   --   --   --   --   HGB 9.5*  < > 10.8*  < > 9.2* 8.3* 8.8* 8.0*  HCT 29.1*  < > 32.2*  < > 27.7* 24.4* 26.0* 24.7*  MCV 96.4  < > 99.1  < > 98.9 97.2  --  99.2  PLT 323  < > 285  < > 389 338  --  343  < > = values in this interval not displayed.  Lipid Panel:  Recent Labs  06/20/13 0445 01/16/14 0405  HDL 65 32*   CBG:  Recent Labs  01/24/14 0746 05/28/14 1130 05/31/14 1110  GLUCAP 180* 139* 98    EXAM: CT HEAD WITHOUT CONTRAST   TECHNIQUE: Contiguous axial images were  obtained from the base of the skull through the vertex without intravenous contrast.   COMPARISON:  Noncontrast CT scan of the brain dated Jan 15, 2014 and MRI of the brain of Jan 20, 2014   FINDINGS: There is stable encephalomalacia of much of the inferior right temporal lobe status post craniectomy. There is mild stable diffuse cerebral and cerebellar atrophy with compensatory ventriculomegaly. There is no shift of the midline. There is a subcentimeter lacune in the posterior limb of the left basal ganglia new since the previous studies. There is no acute intracranial hemorrhage nor evidence of acute ischemic change. The cerebellum and brainstem are unremarkable.   The observed paranasal sinuses and mastoid air cells are clear. There is no acute skull fracture.   IMPRESSION: 1. There is no acute ischemic or hemorrhagic event. There are chronic stable changes of small vessel ischemia. A subcentimeter lacunar infarction in the posterior limb of the left basal ganglia has appeared since the previous studies. 2. There are stable changes of encephalomalacia status post craniectomy in the right temple region.   EXAM: CHEST  2 VIEW   COMPARISON:  01/15/2014   FINDINGS: Cardiac shadow is mildly enlarged. The lungs are hypoinflated. Right basilar atelectasis is noted. Additionally some right mid lung infiltrate is seen. No acute bony abnormality is noted.   IMPRESSION: Right basilar atelectasis and right mid lung infiltrate. EXAM: MR OF THE RIGHT HIP WITHOUT CONTRAST   TECHNIQUE: Multiplanar, multisequence MR imaging was performed. No intravenous contrast was administered.   COMPARISON:  None.   FINDINGS: Bones: No marrow signal abnormality. No fracture, dislocation or avascular necrosis. Bilateral SI joints are unremarkable. Degenerative disc disease of the lower lumbar spine partially visualized.   Articular cartilage and labrum   Articular cartilage: Partial thickness  cartilage loss of the superior femoral head and superior acetabulum bilaterally, left greater than right.   Labrum:  Right labral tear with a small paralabral cyst.   Joint or bursal effusion   Joint effusion:  No significant joint effusion.   Bursae:  No bursa formation.   Muscles and tendons   Muscles and tendons: Mild edema in the gluteus maximus muscle likely reflecting muscle strain. There is mild right gluteus minimus insertional tendinosis. The iliopsoas tendons are intact. The hamstring origins are intact. The adductor are intact. The gluteal tendons are intact. There is relative increased T2  signal within bilateral quadriceps musculature which is incompletely imaged and may reflect nonspecific myositis secondary to a posttraumatic versus infectious versus inflammatory etiology.   Other findings   Miscellaneous:  No focal intrapelvic abnormality.   IMPRESSION: 1. Moderate left hip osteoarthritis. 2. Moderate right hip osteoarthritis. 3. Mild gluteus minimus insertional tendinosis. 4. Mild edema signal within bilateral quadriceps musculature which is incompletely imaged and may reflect nonspecific myositis secondary to a posttraumatic versus infectious versus inflammatory etiology.   ASSESSMENT/PLAN:  MSSA Bacteremia,  HCAP or stitch abscess on graft - continue Ancef at dialysis Seizure - stable; continue Depakote and Neurontin ESRD - on hemodialysis Parkinson's disease - continue Sinemet Hypertension - well controlled; continue metoprolol Anemia of chronic kidney disease - continue Aranesp @hemodialysis  Hyperlipidemia -  Continue Lipitor  Depression - continue Lexapro   CPT CODE: 66060  Donielle Radziewicz Vargas - NP [facility] [phone]

## 2014-06-06 ENCOUNTER — Ambulatory Visit: Payer: Medicare Other | Admitting: Neurology

## 2014-06-07 ENCOUNTER — Telehealth: Payer: Self-pay | Admitting: Neurology

## 2014-06-07 NOTE — Telephone Encounter (Signed)
Pt no showed 06/06/14 NP appt w/ Dr. Delice Lesch.  Danae Chen - please notify referring office of no show and send no show letter to pt / Sherri S.

## 2014-06-08 ENCOUNTER — Non-Acute Institutional Stay (SKILLED_NURSING_FACILITY): Payer: Medicare Other | Admitting: Internal Medicine

## 2014-06-08 ENCOUNTER — Encounter: Payer: Self-pay | Admitting: *Deleted

## 2014-06-08 ENCOUNTER — Encounter: Payer: Self-pay | Admitting: Vascular Surgery

## 2014-06-08 DIAGNOSIS — J189 Pneumonia, unspecified organism: Secondary | ICD-10-CM

## 2014-06-08 DIAGNOSIS — G40909 Epilepsy, unspecified, not intractable, without status epilepticus: Secondary | ICD-10-CM

## 2014-06-08 DIAGNOSIS — I150 Renovascular hypertension: Secondary | ICD-10-CM | POA: Insufficient documentation

## 2014-06-08 DIAGNOSIS — N390 Urinary tract infection, site not specified: Secondary | ICD-10-CM

## 2014-06-08 NOTE — Progress Notes (Signed)
HISTORY & PHYSICAL  DATE: 06/08/2014   FACILITY: [facility]  LEVEL OF CARE: SNF (31)  ALLERGIES:  No Known Allergies  CHIEF COMPLAINT:  Manage pneumonia, UTI and a seizure disorder  HISTORY OF PRESENT ILLNESS: Patient is a 73 year old African American male who was hospitalized with metabolic encephalopathy. After hospitalization patient is admitted to this facility for short-term rehabilitation.  PNEUMONIA: The pneumonia remains stable.  The patient denies ongoing chest pain, cough, shortness of breath, fever, chills or night sweats. No complications reported from the current antibiotic being used.  UTI: The UTI remains stable.  The patient denies ongoing suprapubic pain, flank pain, dysuria, urinary frequency, urinary hesitancy or hematuria.  No complications reported from the current antibiotic being used.  SEIZURE DISORDER: The patient's seizure disorder remains stable. No complications reported from the medications presently being used. Staff do not report any recent seizure activity.  PAST MEDICAL HISTORY :  Past Medical History  Diagnosis Date  . ESRD on hemodialysis     Started HD around 2009, maybe longer.  Was living in [place], [place] then.  Moved to [place] in 2014 and gets HD now at [facility] on [address] on a TTS schedule.  ESRD was due to DM and HTN.     Marland Kitchen Peripheral vascular disease   . Hypertension   . Hyperlipidemia   . Arthritis     Gout  . Paroxysmal atrial fibrillation   . Brain tumor 2009  . Seizure disorder 2009  . Anemia   . Hyperparathyroidism, secondary   . Diabetes mellitus type 2 with complications     Type II, diet controlled  . Proteus mirabilis infection   . CHF (congestive heart failure)     grade 1 DD, preserved EF, [facility] records  . 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 brain tumor  . GERD (gastroesophageal reflux disease)   . Hx of cardiac cath     a. LHC  (9/14):  Normal cors, EF 65%.     PAST SURGICAL HISTORY: Past Surgical History  Procedure Laterality Date  . Av fistula placement Left     arm  . 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 Eye surgery Bilateral 2000    cataract  . Retinopathy surgery      Hx. of laser treatments for diabetics  . Craniectomy / craniotomy for excision of brain tumor  2009  . Fistulogram Left 05/28/2014    Procedure: FISTULOGRAM (ARM);  Surgeon: Conrad [place], MD;  Location: [facility];  Service: Vascular;  Laterality: Left;  Angioplasty of left sephalic vein times two.  . Revision of arteriovenous goretex graft Left 05/28/2014    Procedure: POSSIBLE REVISION OF FISTULA;  Surgeon: Conrad Landen, MD;  Location: [place];  Service: Vascular;  Laterality: Left;  Revision of Left arm fistula.  Darden Dates without cardioversion N/A 05/29/2014    Procedure: TRANSESOPHAGEAL ECHOCARDIOGRAM (TEE);  Surgeon: Fay Records, MD;  Location: [facility] ENDOSCOPY;  Service: Cardiovascular;  Laterality: N/A;  . Radiology with anesthesia Left 05/31/2014    Procedure: MRI WITH ANESTHESIA, Left hip;  Surgeon: Medication Radiologist, MD;  Location: [facility] [place];  Service: Radiology;  Laterality: Left;  Dr Jacinta Shoe ordered    SOCIAL HISTORY:  reports that he has never smoked. He has never used smokeless tobacco. He reports that he drinks  about .6 ounces of alcohol per week. He reports that he does not use illicit drugs.  FAMILY HISTORY:  Family History  Problem Relation Age of Onset  . Seizures Mother   . Stroke Mother 68  . Hypertension Mother   . Alcohol abuse Father   . Hypertension Father   . Arthritis Sister   . Heart attack Neg Hx   . Heart failure Neg Hx     CURRENT MEDICATIONS: Reviewed per MAR/see medication list  REVIEW OF SYSTEMS:  See HPI otherwise 14 point ROS is negative.  PHYSICAL EXAMINATION  VS:  See VS section  GENERAL: no acute distress, obese body habitus EYES: conjunctivae  normal, sclerae normal, normal eye lids MOUTH/THROAT: lips without lesions,no lesions in the mouth,tongue is without lesions,uvula elevates in midline NECK: supple, trachea midline, no neck masses, no thyroid tenderness, no thyromegaly LYMPHATICS: no LAN in the neck, no supraclavicular LAN RESPIRATORY: breathing is even & unlabored, BS CTAB CARDIAC: RRR, no murmur,no extra heart sounds, no edema GI:  ABDOMEN: abdomen soft, normal BS, no masses, no tenderness  LIVER/SPLEEN: no hepatomegaly, no splenomegaly MUSCULOSKELETAL: HEAD: normal to inspection  EXTREMITIES: LEFT UPPER EXTREMITY: Moderate range of motion, normal strength & tone RIGHT UPPER EXTREMITY:  Moderate range of motion greater than left upper extremity, normal strength & tone LEFT LOWER EXTREMITY:  full range of motion, status post aka RIGHT LOWER EXTREMITY:  full range of motion, normal strength & tone PSYCHIATRIC: the patient is alert & oriented to person, affect & behavior appropriate  LABS/RADIOLOGY:  Labs reviewed: Basic Metabolic Panel:  Recent Labs  01/24/14 0846  05/27/14 0759 05/28/14 1640 05/31/14 0856 06/01/14 0500  NA 129*  < > 136* 134* 138 138  K 5.0  < > 4.4 4.9 4.6 4.2  CL 85*  < > 93* 90*  --  94*  CO2 24  < > 26 26  --  27  GLUCOSE 197*  < > 155* 149* 102* 156*  BUN 82*  < > 69* 48*  --  38*  CREATININE 10.04*  < > 8.37* 6.66*  --  7.87*  CALCIUM 9.5  < > 9.6 9.6  --  9.4  PHOS 4.2  --  6.1*  --   --  6.1*  < > = values in this interval not displayed. Liver Function Tests:  Recent Labs  01/15/14 1717 01/16/14 0405  05/23/14 1156 05/27/14 0759 06/01/14 0500  AST 16 17  --  12  --   --   ALT <5 <5  --  <5  --   --   ALKPHOS 56 56  --  66  --   --   BILITOT 0.3 0.2*  --  0.2*  --   --   PROT 8.0 7.1  --  7.7  --   --   ALBUMIN 2.9* 2.6*  < > 2.9* 2.3* 2.4*  < > = values in this interval not displayed.  Recent Labs  01/15/14 1717  AMMONIA 30   CBC:  Recent Labs  01/16/14 0405   05/18/14 1333  05/28/14 1640 05/30/14 0852 05/31/14 0856 06/01/14 0850  WBC 10.4  < > 7.4  < > 13.9* 12.9*  --  7.8  NEUTROABS 5.5  --  4.1  --   --   --   --   --   HGB 9.5*  < > 10.8*  < > 9.2* 8.3* 8.8* 8.0*  HCT 29.1*  < > 32.2*  < >  27.7* 24.4* 26.0* 24.7*  MCV 96.4  < > 99.1  < > 98.9 97.2  --  99.2  PLT 323  < > 285  < > 389 338  --  343  < > = values in this interval not displayed.  Lipid Panel:  Recent Labs  06/20/13 0445 01/16/14 0405  HDL 65 32*   CBG:  Recent Labs  01/24/14 0746 05/28/14 1130 05/31/14 1110  GLUCAP 180* 139* 98    Transthoracic Echocardiography  Patient:    Les, Longmore MR #:       [id number] Study Date: 05/26/2014 Gender:     M Age:        69 Height:     175 cm Weight:     119.5 kg BSA:        2.46 m^2 Pt. Status: Room:       [place], [place]  [place], [place]  Lesli Albee [id number]  SONOGRAPHER  Melissa Morford, RDCS  PERFORMING   [facility], Inpatient  cc:  ------------------------------------------------------------------- LV EF: 60% -   65%  ------------------------------------------------------------------- Indications:      Bacteremia 790.7.  ------------------------------------------------------------------- History:   PMH:   Atrial fibrillation.  Congestive heart failure. Risk factors:  Hypertension. Diabetes mellitus. Dyslipidemia.  ------------------------------------------------------------------- Study Conclusions  - Left ventricle: The cavity size was normal. There was moderate   concentric hypertrophy. Systolic function was normal. The   estimated ejection fraction was in the range of 60% to 65%. Wall   motion was normal; there were no regional wall motion   abnormalities. There was an increased relative contribution of   atrial contraction to ventricular filling. Doppler parameters are   consistent with abnormal left ventricular relaxation (grade  1   diastolic dysfunction). - Mitral valve: There was mild regurgitation.  Impressions:  - There was no evidence of a vegetation.  Recommendations:  Consider transesophageal echocardiography if clinically indicated in order to exclude vegetation. Transthoracic echocardiography.  M-mode, complete 2D, spectral Doppler, and color Doppler.  Birthdate:  Patient birthdate: [birth year].  Age:  Patient is 73 yr old.  Sex:  Gender: male. BMI: 39 kg/m^2.  Blood pressure:     118/67  Patient status: Inpatient.  Study date:  Study date: 05/26/2014. Study time: 01:29 PM.  Location:  Bedside.  -------------------------------------------------------------------  ------------------------------------------------------------------- Left ventricle:  The cavity size was normal. There was moderate concentric hypertrophy. Systolic function was normal. The estimated ejection fraction was in the range of 60% to 65%. Wall motion was normal; there were no regional wall motion abnormalities. There was an increased relative contribution of atrial contraction to ventricular filling. Doppler parameters are consistent with abnormal left ventricular relaxation (grade 1 diastolic dysfunction).  ------------------------------------------------------------------- Aortic valve:   Trileaflet; normal thickness leaflets. Mobility was not restricted.  Doppler:  Transvalvular velocity was within the normal range. There was no stenosis. There was no regurgitation.   ------------------------------------------------------------------- Aorta:  Aortic root: The aortic root was normal in size.  ------------------------------------------------------------------- Mitral valve:   Structurally normal valve.   Mobility was not restricted.  Doppler:  Transvalvular velocity was within the normal range. There was no evidence for stenosis. There was  mild regurgitation.  ------------------------------------------------------------------- Left atrium:  The atrium was normal in size.  ------------------------------------------------------------------- Right ventricle:  The cavity size was normal. Wall thickness was normal. Systolic function was normal.  ------------------------------------------------------------------- Pulmonic valve:    Doppler:  Transvalvular velocity  was within the normal range. There was no evidence for stenosis.  ------------------------------------------------------------------- Tricuspid valve:   Structurally normal valve.    Doppler: Transvalvular velocity was within the normal range. There was no regurgitation.  ------------------------------------------------------------------- Pulmonary artery:   The main pulmonary artery was normal-sized. Systolic pressure was within the normal range.  ------------------------------------------------------------------- Right atrium:  The atrium was normal in size.  ------------------------------------------------------------------- Pericardium:  There was no pericardial effusion.  ------------------------------------------------------------------- Systemic veins: Inferior vena cava: The vessel was normal in size.  ------------------------------------------------------------------- Measurements   Left ventricle                         Value        Reference  LV ID, ED, PLAX chordal                43.1  mm     43 - 52  LV ID, ES, PLAX chordal                35.2  mm     23 - 38  LV fx shortening, PLAX chordal (L)     18    %      >=29  LV PW thickness, ED                    19    mm     ---------  IVS/LV PW ratio, ED                    0.91         <=1.3  LV e&', lateral                         6.25  cm/s   ---------  LV E/e&', lateral                       9.63         ---------  LV e&', medial                          5.92  cm/s   ---------  LV E/e&',  medial                        10.17        ---------  LV e&', average                         6.09  cm/s   ---------  LV E/e&', average                       9.89         ---------    Ventricular septum                     Value        Reference  IVS thickness, ED                      17.3  mm     ---------    Aorta                                  Value  Reference  Aortic root ID, ED                     32    mm     ---------    Left atrium                            Value        Reference  LA ID, A-P, ES                         45    mm     ---------  LA ID/bsa, A-P                         1.83  cm/m^2 <=2.2    Mitral valve                           Value        Reference  Mitral E-wave peak velocity            60.2  cm/s   ---------  Mitral A-wave peak velocity            69.1  cm/s   ---------  Mitral deceleration time       (H)     278   ms     150 - 230  Mitral E/A ratio, peak                 0.9          ---------    Systemic veins                         Value        Reference  Estimated CVP                          0     mm Hg  ---------    Right ventricle                        Value        Reference  RV s&', lateral, S                      17.4  cm/s   ---------  Legend: (L)  and  (H)  mark values outside specified reference range.  Transesophageal Echocardiography  Patient:    Quandarius, Nill MR #:       [id number] Study Date: 05/29/2014 Gender:     M Age:        59 Height:     175.3 cm Weight:     114.5 kg BSA:        2.41 m^2 Pt. Status: Room:       6E05C   PERFORMING   Dorris Carnes, M.D.  ORDERING     [place], [place], [place]  Lesli Albee [id number]  SONOGRAPHER  Jimmy Reel, RDCS  cc:  -------------------------------------------------------------------  ------------------------------------------------------------------- Indications:      Bacteremia  790.7.  ------------------------------------------------------------------- Impressions:  - No vegetatiions. present.  Diagnostic transesophageal echocardiography.  2D and color Doppler.  Birthdate:  Patient birthdate: 24-Apr-1941.  Age:  Patient is 73 yr old.  Sex:  Gender: male.    BMI: 37.3 kg/m^2.  Blood pressure: 87/15  Patient status:  Inpatient.  Study date:  Study date: 05/29/2014. Study time: 12:09 PM.  Location:  Endoscopy.  -------------------------------------------------------------------  ------------------------------------------------------------------- Left ventricle:  LV is hypertrophied. LVEF is vigorous.  ------------------------------------------------------------------- Aortic valve:  AV is mildly thickened. No AI.  ------------------------------------------------------------------- Aorta:  The aorta was normal, not dilated, and non-diseased.  ------------------------------------------------------------------- Mitral valve:  MV is mildly thickened. Trace MR.  ------------------------------------------------------------------- Left atrium:   No evidence of thrombus in the atrial cavity or appendage.  ------------------------------------------------------------------- Atrial septum:  No PFO by color doppler or with injection of agitated saline.  ------------------------------------------------------------------- Pulmonic valve:   PV is mildly thickened. Trace PI.  ------------------------------------------------------------------- Tricuspid valve:  TV is normal No TR.   ------------------------------------------------------------------- Post procedure conclusions Ascending Aorta:  - The aorta was normal, not dilated, and non-diseased.  CT HEAD WITHOUT CONTRAST   TECHNIQUE: Contiguous axial images were obtained from the base of the skull through the vertex without intravenous contrast.   COMPARISON:  Noncontrast CT scan of the brain dated Jan 15, 2014 and MRI of the brain of Jan 20, 2014   FINDINGS: There is stable encephalomalacia of much of the inferior right temporal lobe status post craniectomy. There is mild stable diffuse cerebral and cerebellar atrophy with compensatory ventriculomegaly. There is no shift of the midline. There is a subcentimeter lacune in the posterior limb of the left basal ganglia new since the previous studies. There is no acute intracranial hemorrhage nor evidence of acute ischemic change. The cerebellum and brainstem are unremarkable.   The observed paranasal sinuses and mastoid air cells are clear. There is no acute skull fracture.   IMPRESSION: 1. There is no acute ischemic or hemorrhagic event. There are chronic stable changes of small vessel ischemia. A subcentimeter lacunar infarction in the posterior limb of the left basal ganglia has appeared since the previous studies. 2. There are stable changes of encephalomalacia status post craniectomy in the right temple region.   CHEST  2 VIEW   COMPARISON:  01/15/2014   FINDINGS: Cardiac shadow is mildly enlarged. The lungs are hypoinflated. Right basilar atelectasis is noted. Additionally some right mid lung infiltrate is seen. No acute bony abnormality is noted.   IMPRESSION: Right basilar atelectasis and right mid lung infiltrate.   MR OF THE RIGHT HIP WITHOUT CONTRAST   TECHNIQUE: Multiplanar, multisequence MR imaging was performed. No intravenous contrast was administered.   COMPARISON:  None.   FINDINGS: Bones: No marrow signal abnormality. No fracture, dislocation or avascular necrosis. Bilateral SI joints are unremarkable. Degenerative disc disease of the lower lumbar spine partially visualized.   Articular cartilage and labrum   Articular cartilage: Partial thickness cartilage loss of the superior femoral head and superior acetabulum bilaterally, left greater than right.   Labrum:  Right labral tear with a small  paralabral cyst.   Joint or bursal effusion   Joint effusion:  No significant joint effusion.   Bursae:  No bursa formation.   Muscles and tendons   Muscles and tendons: Mild edema in the gluteus maximus muscle likely reflecting muscle strain. There is mild right gluteus minimus insertional tendinosis. The iliopsoas tendons are intact. The hamstring origins are intact. The adductor are intact. The gluteal tendons are intact. There is relative increased T2 signal within bilateral quadriceps musculature which is incompletely imaged and may reflect nonspecific myositis  secondary to a posttraumatic versus infectious versus inflammatory etiology.   Other findings   Miscellaneous:  No focal intrapelvic abnormality.   IMPRESSION: 1. Moderate left hip osteoarthritis. 2. Moderate right hip osteoarthritis. 3. Mild gluteus minimus insertional tendinosis. 4. Mild edema signal within bilateral quadriceps musculature which is incompletely imaged and may reflect nonspecific myositis secondary to a posttraumatic versus infectious versus inflammatory etiology.   ASSESSMENT/PLAN:  Pneumonia-continue cefazolin as prescribed UTI-on cefazolin Seizure disorder-well-controlled Renovascular hypertension-well-controlled Anemia of chronic kidney disease-check hemoglobin. Continue Aranesp. Spinal stenosis-continue pain medications Diabetic neuropathy-continue Neurontin Check CBC  I have reviewed patient's medical records received at admission/from hospitalization.  CPT CODE: 65993  Gayani Y [place], [place] [phone]

## 2014-06-08 NOTE — Progress Notes (Signed)
No show letter sent for 06-06-2014

## 2014-06-09 ENCOUNTER — Ambulatory Visit (INDEPENDENT_AMBULATORY_CARE_PROVIDER_SITE_OTHER): Payer: Medicare Other | Admitting: Vascular Surgery

## 2014-06-09 ENCOUNTER — Encounter: Payer: Self-pay | Admitting: Vascular Surgery

## 2014-06-09 VITALS — BP 91/54 | HR 77 | Ht 76.0 in | Wt 249.0 lb

## 2014-06-09 DIAGNOSIS — Z4931 Encounter for adequacy testing for hemodialysis: Secondary | ICD-10-CM

## 2014-06-09 DIAGNOSIS — N186 End stage renal disease: Secondary | ICD-10-CM

## 2014-06-09 NOTE — ED Provider Notes (Signed)
Medical screening examination/treatment/procedure(s) were performed by non-physician practitioner and as supervising physician I was immediately available for consultation/collaboration.   EKG Interpretation   Date/Time:  Tuesday May 23 2014 11:41:49 EDT Ventricular Rate:  83 PR Interval:  170 QRS Duration: 90 QT Interval:  377 QTC Calculation: 443 R Axis:   1 Text Interpretation:  Sinus rhythm Minimal ST elevation, anterior leads ED  PHYSICIAN INTERPRETATION AVAILABLE IN [facility] Confirmed by TEST,  Record ([id number]) on 05/25/2014 7:20:51 AM       Virgel Manifold, MD 06/09/14 1413

## 2014-06-09 NOTE — Addendum Note (Signed)
Addended by: Mena Goes on: 06/09/2014 05:31 PM   Modules accepted: Orders

## 2014-06-09 NOTE — Progress Notes (Signed)
    Postoperative Access Visit   History of Present Illness  [name] [name] is a 73 y.o. year old male who presents for postoperative follow-up for:  PROCEDURE:  1. left brachiocephalic arteriovenous fistula cannulation under ultrasound guidance  2. left arm fistulogram  3. Venoplasty of cephalic vein x 2 (6 mm x 40 mm, 8 mm x 80 mm)  4. Revision of distal left brachiocephalic arteriovenous fistula  (Date: 05/28/14).  The patient's wounds are healed.  The patient notes no steal symptoms.  The patient is able to complete their activities of daily living.  The patient's current symptoms are: none.  For VQI Use Only  PRE-ADM LIVING: Home  AMB STATUS: Ambulatory  Physical Examination Filed Vitals:   06/09/14 1033  BP: 91/54  Pulse: 77    LUE: Incision is healed, stitches removed, skin feels warm, hand grip is 5/5, sensation in digits is intact, palpable thrill, bruit can  be auscultated   Medical Decision Making  [name] [name] is a 73 y.o. year old male who presents s/p repair of L BC AVF for button hole injury, L cephalic venoplasty.  The patient's access is ready for use.  L access duplex in 3 months.  Thank you for allowing [place] to participate in this patient's care.  Adele Barthel, MD [facility] Office: [phone] Pager: [phone]  06/09/2014, 10:46 AM

## 2014-07-17 ENCOUNTER — Encounter: Payer: Self-pay | Admitting: Adult Health

## 2014-07-17 ENCOUNTER — Non-Acute Institutional Stay (SKILLED_NURSING_FACILITY): Payer: Medicare Other | Admitting: Adult Health

## 2014-07-17 DIAGNOSIS — F329 Major depressive disorder, single episode, unspecified: Secondary | ICD-10-CM

## 2014-07-17 DIAGNOSIS — D631 Anemia in chronic kidney disease: Secondary | ICD-10-CM

## 2014-07-17 DIAGNOSIS — I10 Essential (primary) hypertension: Secondary | ICD-10-CM

## 2014-07-17 DIAGNOSIS — G20 Parkinson's disease: Secondary | ICD-10-CM

## 2014-07-17 DIAGNOSIS — G40909 Epilepsy, unspecified, not intractable, without status epilepticus: Secondary | ICD-10-CM

## 2014-07-17 DIAGNOSIS — Z992 Dependence on renal dialysis: Secondary | ICD-10-CM

## 2014-07-17 DIAGNOSIS — N189 Chronic kidney disease, unspecified: Secondary | ICD-10-CM

## 2014-07-17 DIAGNOSIS — F32A Depression, unspecified: Secondary | ICD-10-CM

## 2014-07-17 DIAGNOSIS — E785 Hyperlipidemia, unspecified: Secondary | ICD-10-CM

## 2014-07-17 DIAGNOSIS — N186 End stage renal disease: Secondary | ICD-10-CM

## 2014-07-17 NOTE — Progress Notes (Signed)
Patient ID: [name] [name], male   DOB: 07/18/1941, 73 y.o.   MRN: [id number]              PROGRESS NOTE  DATE:    07/17/14  FACILITY: [facility]: [facility]  LEVEL OF CARE: SNF (31)   Routine Visit  CHIEF COMPLAINTS: Manage Seizure, Depression, Anemia, Parkinson's Disease and Hypertension   HISTORY OF PRESENT ILLNESS:   REASSESSMENT OF ONGOING PROBLEM(S):  ANEMIA: The anemia has been stable. The patient denies fatigue, melena or hematochezia. No complications from the medications currently being used. 9/15 hgb 7.6  PARKINSON'S DISEASE: pt's Parkinson's disease is stable.  Denies progression of sx recently.  Pt is tolerating Parkinson's disease medications without any complications.  HYPERLIPIDEMIA: No complications from the medications presently being used. 9/15 fasting lipid panel showed : cholesterol 117, HDL 41, LDL 55  Trig 105   PAST MEDICAL HISTORY : Reviewed.  No changes/see problem list  CURRENT MEDICATIONS: Reviewed per MAR/see medication list  REVIEW OF SYSTEMS:  GENERAL: no change in appetite, no fatigue, no weight changes, no fever, chills or weakness RESPIRATORY: no cough, SOB, DOE, wheezing, hemoptysis CARDIAC: no chest pain, edema or palpitations GI: no abdominal pain, diarrhea, constipation, heart burn, nausea or vomiting  PHYSICAL EXAMINATION  GENERAL: no acute distress, normal body habitus NECK: supple, trachea midline, no neck masses, no thyroid tenderness, no thyromegaly LYMPHATICS: no LAN in the neck, no supraclavicular LAN RESPIRATORY: breathing is even & unlabored, BS CTAB CARDIAC: RRR, no murmur,no extra heart sounds, no edema GI: abdomen soft, normal BS, no masses, no tenderness, no hepatomegaly, no splenomegaly EXTREMITIES:  Left BKA with prosthetic leg, left upper arm with AV shunt; able to move all 4 extremities PSYCHIATRIC: the patient is alert & oriented to person, affect & behavior appropriate  LABS/RADIOLOGY: 06/14/14   cholesterol 117, HDL 41, LDL 55  Trig 105  T PRO 6.4  ALB 2.7  AST 9  ALT 1  ALP 76 06/07/14  WBC 8.6 hemoglobin 7.6 hematocrit 25.0 MCV 105.9 Labs reviewed: Basic Metabolic Panel:  Recent Labs  01/24/14 0846  05/27/14 0759 05/28/14 1640 05/31/14 0856 06/01/14 0500  NA 129*  < > 136* 134* 138 138  K 5.0  < > 4.4 4.9 4.6 4.2  CL 85*  < > 93* 90*  --  94*  CO2 24  < > 26 26  --  27  GLUCOSE 197*  < > 155* 149* 102* 156*  BUN 82*  < > 69* 48*  --  38*  CREATININE 10.04*  < > 8.37* 6.66*  --  7.87*  CALCIUM 9.5  < > 9.6 9.6  --  9.4  PHOS 4.2  --  6.1*  --   --  6.1*  < > = values in this interval not displayed.  Liver Function Tests:  Recent Labs  01/15/14 1717 01/16/14 0405  05/23/14 1156 05/27/14 0759 06/01/14 0500  AST 16 17  --  12  --   --   ALT <5 <5  --  <5  --   --   ALKPHOS 56 56  --  66  --   --   BILITOT 0.3 0.2*  --  0.2*  --   --   PROT 8.0 7.1  --  7.7  --   --   ALBUMIN 2.9* 2.6*  < > 2.9* 2.3* 2.4*  < > = values in this interval not displayed.  Recent Labs  01/15/14 1717  AMMONIA 30   CBC:  Recent Labs  01/16/14 0405  05/18/14 1333  05/28/14 1640 05/30/14 0852 05/31/14 0856 06/01/14 0850  WBC 10.4  < > 7.4  < > 13.9* 12.9*  --  7.8  NEUTROABS 5.5  --  4.1  --   --   --   --   --   HGB 9.5*  < > 10.8*  < > 9.2* 8.3* 8.8* 8.0*  HCT 29.1*  < > 32.2*  < > 27.7* 24.4* 26.0* 24.7*  MCV 96.4  < > 99.1  < > 98.9 97.2  --  99.2  PLT 323  < > 285  < > 389 338  --  343  < > = values in this interval not displayed.  Lipid Panel:  Recent Labs  01/16/14 0405  HDL 32*   CBG:  Recent Labs  01/24/14 0746 05/28/14 1130 05/31/14 1110  GLUCAP 180* 139* 98    EXAM: CT HEAD WITHOUT CONTRAST   TECHNIQUE: Contiguous axial images were obtained from the base of the skull through the vertex without intravenous contrast.   COMPARISON:  Noncontrast CT scan of the brain dated Jan 15, 2014 and MRI of the brain of Jan 20, 2014   FINDINGS: There is  stable encephalomalacia of much of the inferior right temporal lobe status post craniectomy. There is mild stable diffuse cerebral and cerebellar atrophy with compensatory ventriculomegaly. There is no shift of the midline. There is a subcentimeter lacune in the posterior limb of the left basal ganglia new since the previous studies. There is no acute intracranial hemorrhage nor evidence of acute ischemic change. The cerebellum and brainstem are unremarkable.   The observed paranasal sinuses and mastoid air cells are clear. There is no acute skull fracture.   IMPRESSION: 1. There is no acute ischemic or hemorrhagic event. There are chronic stable changes of small vessel ischemia. A subcentimeter lacunar infarction in the posterior limb of the left basal ganglia has appeared since the previous studies. 2. There are stable changes of encephalomalacia status post craniectomy in the right temple region.   EXAM: CHEST  2 VIEW   COMPARISON:  01/15/2014   FINDINGS: Cardiac shadow is mildly enlarged. The lungs are hypoinflated. Right basilar atelectasis is noted. Additionally some right mid lung infiltrate is seen. No acute bony abnormality is noted.   IMPRESSION: Right basilar atelectasis and right mid lung infiltrate. EXAM: MR OF THE RIGHT HIP WITHOUT CONTRAST   TECHNIQUE: Multiplanar, multisequence MR imaging was performed. No intravenous contrast was administered.   COMPARISON:  None.   FINDINGS: Bones: No marrow signal abnormality. No fracture, dislocation or avascular necrosis. Bilateral SI joints are unremarkable. Degenerative disc disease of the lower lumbar spine partially visualized.   Articular cartilage and labrum   Articular cartilage: Partial thickness cartilage loss of the superior femoral head and superior acetabulum bilaterally, left greater than right.   Labrum:  Right labral tear with a small paralabral cyst.   Joint or bursal effusion   Joint  effusion:  No significant joint effusion.   Bursae:  No bursa formation.   Muscles and tendons   Muscles and tendons: Mild edema in the gluteus maximus muscle likely reflecting muscle strain. There is mild right gluteus minimus insertional tendinosis. The iliopsoas tendons are intact. The hamstring origins are intact. The adductor are intact. The gluteal tendons are intact. There is relative increased T2 signal within bilateral quadriceps musculature which is incompletely imaged and may reflect nonspecific myositis secondary  to a posttraumatic versus infectious versus inflammatory etiology.   Other findings   Miscellaneous:  No focal intrapelvic abnormality.   IMPRESSION: 1. Moderate left hip osteoarthritis. 2. Moderate right hip osteoarthritis. 3. Mild gluteus minimus insertional tendinosis. 4. Mild edema signal within bilateral quadriceps musculature which is incompletely imaged and may reflect nonspecific myositis secondary to a posttraumatic versus infectious versus inflammatory etiology.   ASSESSMENT/PLAN:  Seizure - stable; continue Depakote and Neurontin ESRD - on hemodialysis Parkinson's disease - continue Sinemet Hypertension - well controlled; continue Toprol XL Anemia of chronic kidney disease - continue Aranesp @hemodialysis  Hyperlipidemia -  Continue Lipitor  Depression - continue Lexapro   CPT CODE: 36681  Nolyn Eilert Vargas - NP [facility] [phone]

## 2014-07-25 ENCOUNTER — Other Ambulatory Visit: Payer: Self-pay | Admitting: *Deleted

## 2014-07-25 ENCOUNTER — Telehealth: Payer: Self-pay

## 2014-07-25 DIAGNOSIS — L98499 Non-pressure chronic ulcer of skin of other sites with unspecified severity: Secondary | ICD-10-CM

## 2014-07-25 DIAGNOSIS — L97519 Non-pressure chronic ulcer of other part of right foot with unspecified severity: Secondary | ICD-10-CM

## 2014-07-25 DIAGNOSIS — I70209 Unspecified atherosclerosis of native arteries of extremities, unspecified extremity: Secondary | ICD-10-CM

## 2014-07-25 NOTE — Telephone Encounter (Signed)
Rec'd call from [place], nurse @ [facility].  Reported that pt. Has a 50 cent size blister on the right foot, and a purple discoloration to the right heel.  Stated with pt's previous hx. of left BKA, Dr. Marval Regal is requesting an appt. ASAP.   Appt. scheduled for ABI of right LE and appt. with Dr. Bridgett Larsson tomorrow @ 1:00 PM, and 1:45 PM.  Notified [place] of appts.; spoke with Kathlee Nations.  Stated she will make necessary arrangements, and will call office in AM if unable to coordinate the transportation.  Chris from [facility]. Will fax order.

## 2014-07-26 ENCOUNTER — Ambulatory Visit (INDEPENDENT_AMBULATORY_CARE_PROVIDER_SITE_OTHER): Payer: Medicare Other | Admitting: Vascular Surgery

## 2014-07-26 ENCOUNTER — Ambulatory Visit (HOSPITAL_COMMUNITY)
Admission: RE | Admit: 2014-07-26 | Discharge: 2014-07-26 | Disposition: A | Discharge status: Home / Self Care | Payer: Medicare Other | Source: Ambulatory Visit | Attending: Vascular Surgery | Admitting: Vascular Surgery

## 2014-07-26 ENCOUNTER — Encounter: Payer: Self-pay | Admitting: Vascular Surgery

## 2014-07-26 VITALS — BP 151/77 | HR 68 | Ht 76.0 in | Wt 273.0 lb

## 2014-07-26 DIAGNOSIS — I70209 Unspecified atherosclerosis of native arteries of extremities, unspecified extremity: Secondary | ICD-10-CM

## 2014-07-26 DIAGNOSIS — L98499 Non-pressure chronic ulcer of skin of other sites with unspecified severity: Secondary | ICD-10-CM | POA: Diagnosis not present

## 2014-07-26 DIAGNOSIS — L97519 Non-pressure chronic ulcer of other part of right foot with unspecified severity: Secondary | ICD-10-CM | POA: Diagnosis not present

## 2014-07-26 DIAGNOSIS — I70299 Other atherosclerosis of native arteries of extremities, unspecified extremity: Secondary | ICD-10-CM

## 2014-07-26 DIAGNOSIS — L97909 Non-pressure chronic ulcer of unspecified part of unspecified lower leg with unspecified severity: Secondary | ICD-10-CM

## 2014-07-26 DIAGNOSIS — I739 Peripheral vascular disease, unspecified: Secondary | ICD-10-CM | POA: Diagnosis not present

## 2014-07-26 NOTE — Progress Notes (Addendum)
Consultation  History of Present Illness  [name] [name] is a 73 y.o. male who presents with chief complaint: Right foot wound.  He states it appeared Sunday 07/23/2014.  He has known PAD with resulting left BKA.  His past medical history includes  Hypertension, DM, hyperlipidemia treated with Lipitor, Parkinson, and CKD on dialysis currently.    His ambulation is limited so he does not note intermittent claudication or rest pain.  He believes his R foot developed a blister due to rubbing against the foot rest in his bed.  He denies any fever or chills.  He does note some serosanguinous drainage from his blister.  Past Medical History  Diagnosis Date  . ESRD on hemodialysis     Started HD around 2009, maybe longer.  Was living in [place], [place] then.  Moved to [place] in 2014 and gets HD now at [facility] on [address] on a TTS schedule.  ESRD was due to DM and HTN.     Marland Kitchen Peripheral vascular disease   . Hypertension   . Hyperlipidemia   . Arthritis     Gout  . Paroxysmal atrial fibrillation   . Brain tumor 2009  . Seizure disorder 2009  . Anemia   . Hyperparathyroidism, secondary   . Diabetes mellitus type 2 with complications     Type II, diet controlled  . Proteus mirabilis infection   . CHF (congestive heart failure)     grade 1 DD, preserved EF, [facility] records  . 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 brain tumor  . GERD (gastroesophageal reflux disease)   . Hx of cardiac cath     a. LHC (9/14):  Normal cors, EF 65%.     Past Surgical History  Procedure Laterality Date  . Av fistula placement Left     arm  . 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 Eye surgery Bilateral 2000    cataract  . Retinopathy surgery      Hx. of laser treatments for diabetics  . Craniectomy / craniotomy for excision of brain tumor  2009  . Fistulogram Left 05/28/2014    Procedure:  FISTULOGRAM (ARM);  Surgeon: Conrad Willow Street, MD;  Location: [facility];  Service: Vascular;  Laterality: Left;  Angioplasty of left sephalic vein times two.  . Revision of arteriovenous goretex graft Left 05/28/2014    Procedure: POSSIBLE REVISION OF FISTULA;  Surgeon: Conrad Pedricktown, MD;  Location: [place];  Service: Vascular;  Laterality: Left;  Revision of Left arm fistula.  Darden Dates without cardioversion N/A 05/29/2014    Procedure: TRANSESOPHAGEAL ECHOCARDIOGRAM (TEE);  Surgeon: Fay Records, MD;  Location: [facility] ENDOSCOPY;  Service: Cardiovascular;  Laterality: N/A;  . Radiology with anesthesia Left 05/31/2014    Procedure: MRI WITH ANESTHESIA, Left hip;  Surgeon: Medication Radiologist, MD;  Location: [facility];  Service: Radiology;  Laterality: Left;  Dr Jacinta Shoe ordered    History   Social History  . Marital Status: Married    Spouse Name: [name]    Number of Children: 3  . Years of Education: 12th   Occupational History  .      N/A   Social History Main Topics  . Smoking status: Never Smoker   . Smokeless tobacco: Never Used  . Alcohol Use: 0.6 oz/week    1 Shots of liquor per week  .  Drug Use: No  . Sexual Activity: No   Other Topics Concern  . Not on file   Social History Narrative   Pt lives at home with spouse.   Caffeine Use: 1-2 cups daily.   Uses a cane for ambulation and prosthetic limb  Left leg.      Family History  Problem Relation Age of Onset  . Seizures Mother   . Stroke Mother 39  . Hypertension Mother   . Alcohol abuse Father   . Hypertension Father   . Arthritis Sister   . Heart attack Neg Hx   . Heart failure Neg Hx     Current Outpatient Prescriptions on File Prior to Visit  Medication Sig Dispense Refill  . acetaminophen (TYLENOL) 325 MG tablet Take 325 mg by mouth every 6 (six) hours as needed for moderate pain or fever.    Marland Kitchen aspirin EC 325 MG tablet Take 325 mg by mouth daily.    Marland Kitchen atorvastatin (LIPITOR) 20 MG tablet Take 20 mg by mouth at bedtime.       . B Complex-C-Folic Acid (RENO CAPS) 1 MG CAPS Take 1 mg by mouth at bedtime.    . carbidopa-levodopa (SINEMET IR) 25-100 MG per tablet Take 0.5 tablets by mouth 3 (three) times daily. For dialysis days, give 45 mins prior to HD tx for restless leg syndrome    . Cholecalciferol (VITAMIN D3) 1000 UNITS CAPS Take 1 capsule by mouth daily.    . cinacalcet (SENSIPAR) 30 MG tablet Take 1 tablet (30 mg total) by mouth daily after supper. 60 tablet   . darbepoetin (ARANESP) 60 MCG/0.3ML SOLN injection Inject 0.3 mLs (60 mcg total) into the vein every Thursday with hemodialysis. 4.2 mL   . divalproex (DEPAKOTE) 500 MG DR tablet Take 3 tablets (1,500 mg total) by mouth daily. 30 tablet 0  . escitalopram (LEXAPRO) 5 MG tablet Take 1 tablet (5 mg total) by mouth daily.    Marland Kitchen gabapentin (NEURONTIN) 300 MG capsule Take 1 capsule (300 mg total) by mouth at bedtime.    Marland Kitchen guaifenesin (ROBITUSSIN) 100 MG/5ML syrup Take 10 mLs (200 mg total) by mouth every 4 (four) hours as needed for congestion. 120 mL 0  . hydrocortisone (ANUSOL-HC) 2.5 % rectal cream Place rectally 2 (two) times daily. 30 g 0  . Menthol, Topical Analgesic, (BIOFREEZE EX) Apply 1 application topically 4 (four) times daily as needed (For neck pain).    . metoprolol succinate (TOPROL-XL) 25 MG 24 hr tablet Take 12.5 mg by mouth at bedtime.    . nitroGLYCERIN (NITROSTAT) 0.4 MG SL tablet Place 1 tablet (0.4 mg total) under the tongue every 5 (five) minutes x 3 doses as needed for chest pain. 30 tablet 12  . omeprazole (PRILOSEC OTC) 20 MG tablet Take 20 mg by mouth daily as needed (heartburn).     Marland Kitchen oxyCODONE-acetaminophen (PERCOCET/ROXICET) 5-325 MG per tablet Take 1 tablet by mouth every 6 (six) hours as needed for severe pain. 30 tablet 0  . senna-docusate (SENOKOT-S) 8.6-50 MG per tablet Take 1 tablet by mouth at bedtime as needed for mild constipation.    . simethicone (MYLICON) 80 MG chewable tablet Chew 80 mg by mouth 2 (two) times daily as  needed for flatulence.     No current facility-administered medications on file prior to visit.    No Known Allergies  Review of Systems  Constitutional: Negative for fever and chills.  HENT: Negative for congestion and nosebleeds.   Eyes:  Negative for blurred vision.  Respiratory: Negative for cough and wheezing.   Cardiovascular: Negative for chest pain and palpitations.  Gastrointestinal: Negative for heartburn, nausea and vomiting.  Skin: Negative for itching.  Neurological: Positive for seizures. Negative for headaches.  Psychiatric/Behavioral: Positive for depression.   Physical Examination  Filed Vitals:   07/26/14 1406  BP: 151/77  Pulse: 68  Height: 6\' 4"  (1.93 m)  Weight: 273 lb (123.832 kg)  SpO2: 99%    General: A&O x 3, WDWN   Pulmonary: Sym exp, good air movt, CTAB  Cardiac: RRR, Nl S1, S2, no Murmurs, rubs or gallops  Gastrointestinal: soft, NTND, - masses,   Musculoskeletal: M/S 5/5 throughout , Extremities without ischemic changes   Vascular: Palpable femoral, no palpable distal pulses.  He has a BKA on the left  ABI: R ABI [place] Right PT and DP monophasic TBI 0.37   A/P  Debridement of the right foot wound showed a pink base.  The superficial "blister" skin was macerated.  There is a chance that this will heal without further work up at this point.   We will order soap and water washes 2 daily followed by wet to dry dressing changes.  He will f/u in 3 weeks for a wound check.  If the wound gets worse we will have to move forward with an angiogram study to get a better view of his vascular flow.  He was seen in the clinic by Dr. Bridgett Larsson today.  Theda Sers Montay Vanvoorhis [facility] PA-C   [facility] Office: [phone]   07/26/2014, 2:55 PM  Addendum  I have independently interviewed and examined the patient, and I agree with the physician assistant's findings.  Pt has atherosclerosis in his R leg with blistering that has  now resulted in an ulcer.  I debrided the skin overlying the ulcer due to concerns for possible secondary infection of the hematoma contained in the blister.  No frank pus was drainage and the underlying subcutaneous tissue looks pink with some areas of maceration.  In my previous interactions with this patient, he has had significantly decreased level of function compared to today.  I previously would not have offer this patient limb salvage has he has been so limited by his dementia.  Today's exam is considerably more different.  I still would manage this patient primarily with wound care first.  If he fails to heal within the next 3 weeks, we will start with angiography to evaluate the right leg.  Adele Barthel, MD [facility] [place] [place] Office: [phone] Pager: [phone]  07/26/2014, 4:07 PM

## 2014-08-17 ENCOUNTER — Encounter: Payer: Self-pay | Admitting: Vascular Surgery

## 2014-08-18 ENCOUNTER — Encounter: Payer: Self-pay | Admitting: Vascular Surgery

## 2014-08-18 ENCOUNTER — Ambulatory Visit (INDEPENDENT_AMBULATORY_CARE_PROVIDER_SITE_OTHER): Payer: Self-pay | Admitting: Vascular Surgery

## 2014-08-18 VITALS — BP 125/63 | HR 78 | Ht 76.0 in | Wt 273.0 lb

## 2014-08-18 DIAGNOSIS — I70299 Other atherosclerosis of native arteries of extremities, unspecified extremity: Secondary | ICD-10-CM

## 2014-08-18 DIAGNOSIS — L97909 Non-pressure chronic ulcer of unspecified part of unspecified lower leg with unspecified severity: Secondary | ICD-10-CM

## 2014-08-18 NOTE — Progress Notes (Signed)
HISTORY AND PHYSICAL     CC:  F/u for foot wound Referring Provider:  Donato Heinz, MD  HPI: This is a 73 y.o. male who presented to Dr. Bridgett Larsson ~ 3 weeks ago with complaints a new wound on his right plantar surface.  He states that he got this wound by rubbing his foot on the foot rest at the nursing center.  Dr. Bridgett Larsson debrided this at the last visit.  He has been doing wound care.  He states that he thinks his wound is improving with his current wound care.  He states that his pain is better.    He does have a fistula in his LUA that is working fine with HD.  Past Medical History  Diagnosis Date  . ESRD on hemodialysis     Started HD around 2009, maybe longer.  Was living in [place], [place] then.  Moved to [place] in 2014 and gets HD now at [facility] on [address] on a TTS schedule.  ESRD was due to DM and HTN.     Marland Kitchen Peripheral vascular disease   . Hypertension   . Hyperlipidemia   . Arthritis     Gout  . Paroxysmal atrial fibrillation   . Brain tumor 2009  . Seizure disorder 2009  . Anemia   . Hyperparathyroidism, secondary   . Diabetes mellitus type 2 with complications     Type II, diet controlled  . Proteus mirabilis infection   . CHF (congestive heart failure)     grade 1 DD, preserved EF, [facility] records  . 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 brain tumor  . GERD (gastroesophageal reflux disease)   . Hx of cardiac cath     a. LHC (9/14):  Normal cors, EF 65%.     Past Surgical History  Procedure Laterality Date  . Av fistula placement Left     arm  . 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 Eye surgery Bilateral 2000    cataract  . Retinopathy surgery      Hx. of laser treatments for diabetics  . Craniectomy / craniotomy for excision of brain tumor  2009  . Fistulogram Left 05/28/2014    Procedure: FISTULOGRAM (ARM);  Surgeon: Conrad Parker, MD;  Location:  [place];  Service: Vascular;  Laterality: Left;  Angioplasty of left sephalic vein times two.  . Revision of arteriovenous goretex graft Left 05/28/2014    Procedure: POSSIBLE REVISION OF FISTULA;  Surgeon: Conrad [place], MD;  Location: [place];  Service: Vascular;  Laterality: Left;  Revision of Left arm fistula.  Darden Dates without cardioversion N/A 05/29/2014    Procedure: TRANSESOPHAGEAL ECHOCARDIOGRAM (TEE);  Surgeon: Fay Records, MD;  Location: [facility] ENDOSCOPY;  Service: Cardiovascular;  Laterality: N/A;  . Radiology with anesthesia Left 05/31/2014    Procedure: MRI WITH ANESTHESIA, Left hip;  Surgeon: Medication Radiologist, MD;  Location: [place];  Service: Radiology;  Laterality: Left;  Dr Jacinta Shoe ordered    No Known Allergies  Current Outpatient Prescriptions  Medication Sig Dispense Refill  . acetaminophen (TYLENOL) 325 MG tablet Take 325 mg by mouth every 6 (six) hours as needed for moderate pain or fever.    Marland Kitchen aspirin EC 325 MG tablet Take 325 mg by mouth daily.    Marland Kitchen atorvastatin (LIPITOR) 20 MG tablet Take 20 mg by mouth at bedtime.     Marland Kitchen  B Complex-C-Folic Acid (RENO CAPS) 1 MG CAPS Take 1 mg by mouth at bedtime.    . carbidopa-levodopa (SINEMET IR) 25-100 MG per tablet Take 0.5 tablets by mouth 3 (three) times daily. For dialysis days, give 45 mins prior to HD tx for restless leg syndrome    . Cholecalciferol (VITAMIN D3) 1000 UNITS CAPS Take 1 capsule by mouth daily.    . cinacalcet (SENSIPAR) 30 MG tablet Take 1 tablet (30 mg total) by mouth daily after supper. 60 tablet   . darbepoetin (ARANESP) 60 MCG/0.3ML SOLN injection Inject 0.3 mLs (60 mcg total) into the vein every Thursday with hemodialysis. 4.2 mL   . divalproex (DEPAKOTE) 500 MG DR tablet Take 3 tablets (1,500 mg total) by mouth daily. 30 tablet 0  . escitalopram (LEXAPRO) 5 MG tablet Take 1 tablet (5 mg total) by mouth daily.    Marland Kitchen gabapentin (NEURONTIN) 300 MG capsule Take 1 capsule (300 mg total) by mouth at bedtime.    Marland Kitchen  guaifenesin (ROBITUSSIN) 100 MG/5ML syrup Take 10 mLs (200 mg total) by mouth every 4 (four) hours as needed for congestion. 120 mL 0  . hydrocortisone (ANUSOL-HC) 2.5 % rectal cream Place rectally 2 (two) times daily. 30 g 0  . Menthol, Topical Analgesic, (BIOFREEZE EX) Apply 1 application topically 4 (four) times daily as needed (For neck pain).    . metoprolol succinate (TOPROL-XL) 25 MG 24 hr tablet Take 12.5 mg by mouth at bedtime.    . nitroGLYCERIN (NITROSTAT) 0.4 MG SL tablet Place 1 tablet (0.4 mg total) under the tongue every 5 (five) minutes x 3 doses as needed for chest pain. 30 tablet 12  . omeprazole (PRILOSEC OTC) 20 MG tablet Take 20 mg by mouth daily as needed (heartburn).     Marland Kitchen oxyCODONE-acetaminophen (PERCOCET/ROXICET) 5-325 MG per tablet Take 1 tablet by mouth every 6 (six) hours as needed for severe pain. 30 tablet 0  . senna-docusate (SENOKOT-S) 8.6-50 MG per tablet Take 1 tablet by mouth at bedtime as needed for mild constipation.    . simethicone (MYLICON) 80 MG chewable tablet Chew 80 mg by mouth 2 (two) times daily as needed for flatulence.     No current facility-administered medications for this visit.    Family History  Problem Relation Age of Onset  . Seizures Mother   . Stroke Mother 8  . Hypertension Mother   . Alcohol abuse Father   . Hypertension Father   . Arthritis Sister   . Heart attack Neg Hx   . Heart failure Neg Hx     History   Social History  . Marital Status: Married    Spouse Name: [name]    Number of Children: 3  . Years of Education: 12th   Occupational History  .      N/A   Social History Main Topics  . Smoking status: Never Smoker   . Smokeless tobacco: Never Used  . Alcohol Use: 0.6 oz/week    1 Shots of liquor per week  . Drug Use: No  . Sexual Activity: No   Other Topics Concern  . Not on file   Social History Narrative   Pt lives at home with spouse.   Caffeine Use: 1-2 cups daily.   Uses a cane for ambulation  and prosthetic limb  Left leg.       ROS: [x]  Positive   [ ]  Negative   [ ]  All sytems reviewed and are negative  Cardiovascular: []   chest pain/pressure []  palpitations []  SOB lying flat []  DOE []  pain in legs while walking []  pain in feet when lying flat []  hx of DVT []  hx of phlebitis []  swelling in legs []  varicose veins  Pulmonary: []  productive cough []  asthma []  wheezing  Neurologic: []  weakness in []  arms []  legs []  numbness in []  arms []  legs [] difficulty speaking or slurred speech []  temporary loss of vision in one eye []  dizziness  Hematologic: []  bleeding problems []  problems with blood clotting easily  GI []  vomiting blood []  blood in stool  GU: []  burning with urination []  blood in urine [x]  renal failure  Psychiatric: []  hx of major depression  Integumentary: []  rashes [x]  wound right foot  Constitutional: []  fever []  chills   PHYSICAL EXAMINATION:  Filed Vitals:   08/18/14 1350  BP: 125/63  Pulse: 78   Body mass index is 33.24 kg/(m^2).  General:  WDWN in NAD Gait: Not observed-in wheelchair HENT: WNL, normocephalic Pulmonary: normal non-labored breathing  Abdomen: obese Skin: without rashes, with ulcer of plantar aspect of right foot Vascular Exam/Pulses: Wound with intact tissue on the plantar aspect of the right foot.  Appears to be healing.  No apparent gangrene or infection. Extremities: with ischemic changes, without Gangrene , without cellulitis; left BKA with prosthesis Musculoskeletal: no muscle wasting or atrophy  Neurologic: A&O X 3; Appropriate Affect ; SENSATION: normal; MOTOR FUNCTION:  moving all extremities equally. Speech is fluent/normal   Non-Invasive Vascular Imaging:   None today    ASSESSMENT/PLAN:: 73 y.o. male with wound to plantar surface of right foot   -wound appears to be healing with current wound care.  -would continue current wound care -f/u with Dr. Bridgett Larsson in 3 months -return sooner if  wound worsens   Leontine Locket, PA-C [facility] [phone]  Clinic MD:  Pt seen and examined in conjunction with Dr. Bridgett Larsson  Addendum  I have independently interviewed and examined the patient, and I agree with the physician assistant's findings.  Pt demonstrates evidence of healing in the area of debrided plantar blister previously.  At this point, there is no frank evidence of gangrene, so I doubt any advantage proceeding with angiography.  Follow up in another 3 months with [facility].  Adele Barthel, MD [facility] [place] Office: [phone] Pager: [phone]  08/18/2014, 2:39 PM

## 2014-08-24 ENCOUNTER — Encounter (HOSPITAL_COMMUNITY): Payer: Self-pay | Admitting: Cardiovascular Disease

## 2014-09-16 IMAGING — CR DG CHEST 2V
2 series · 2 of 2 positions shown · non-contrast
Comparison: DG CHEST 2 VIEW dated 06/19/2013; DG CHEST 2 VIEW dated
05/26/2013

CLINICAL DATA: CHEST PAIN

EXAM:
CHEST  2 VIEW

[w chest pa]
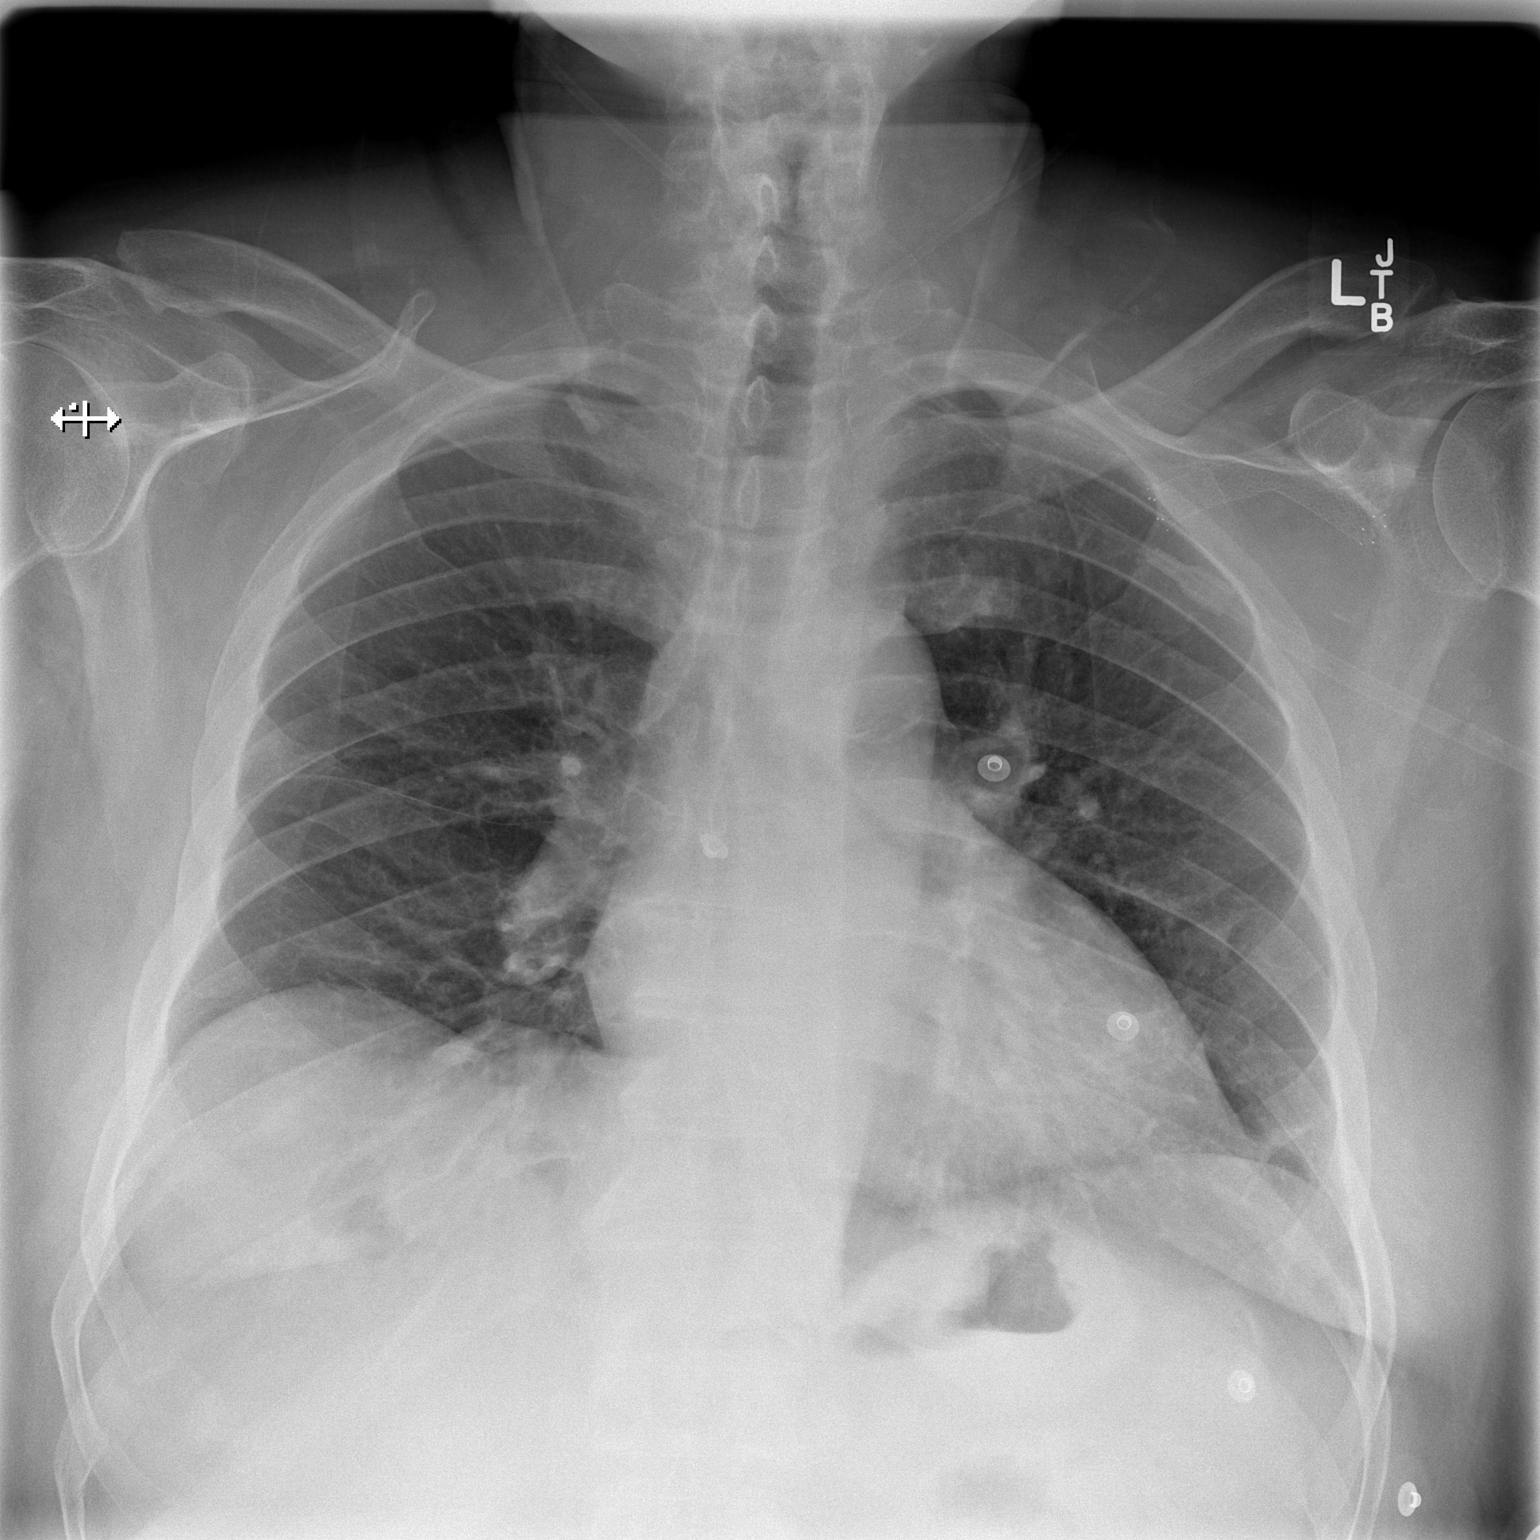

[w chest lat]
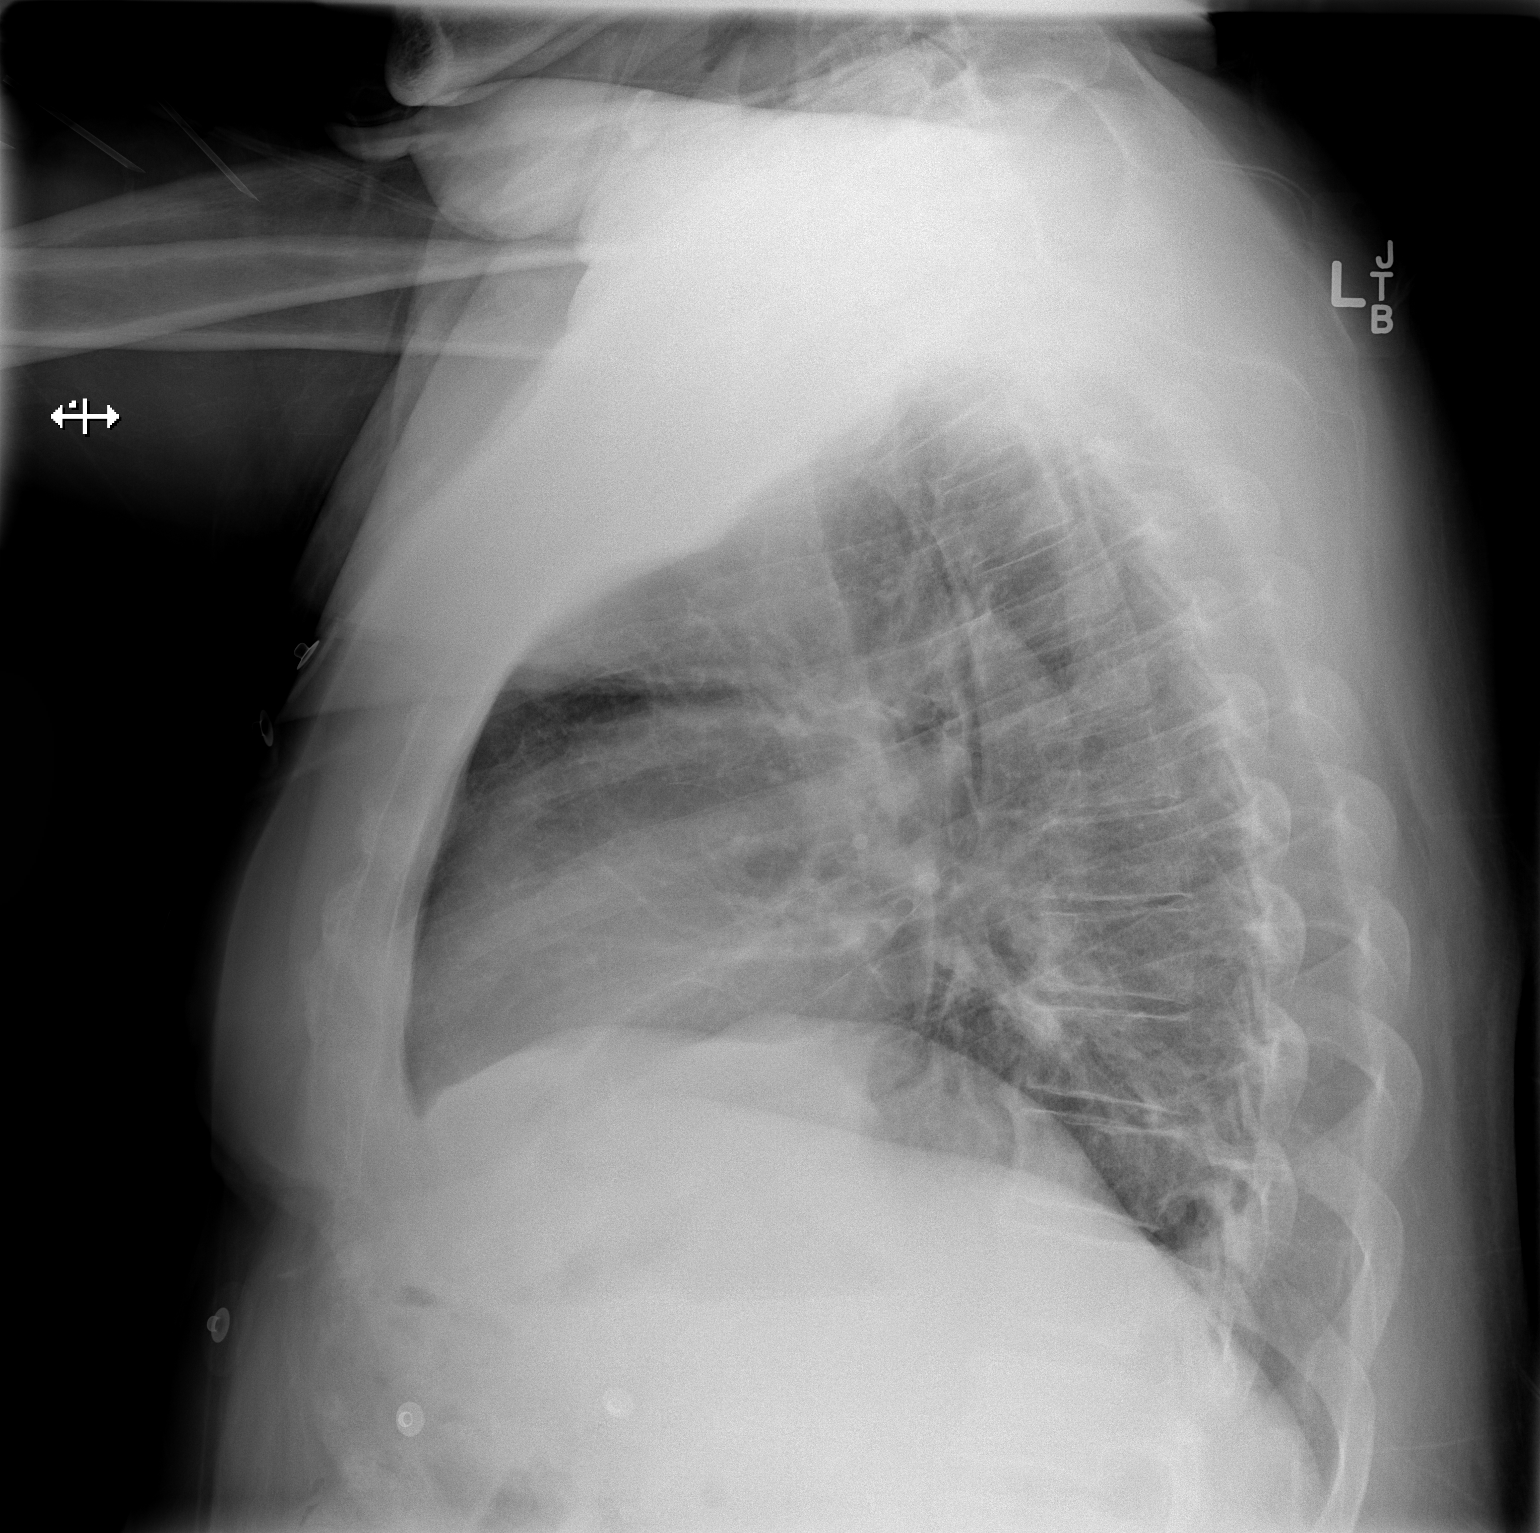

[2 of 2 positions shown; findings below may reference images not displayed]

FINDINGS: The heart size and mediastinal contours are within normal limits.
Both lungs are clear. The visualized skeletal structures are
unremarkable.
IMPRESSION: No active cardiopulmonary disease.

## 2014-09-21 ENCOUNTER — Encounter: Payer: Self-pay | Admitting: Vascular Surgery

## 2014-09-22 ENCOUNTER — Ambulatory Visit (INDEPENDENT_AMBULATORY_CARE_PROVIDER_SITE_OTHER): Payer: Medicare Other | Admitting: Vascular Surgery

## 2014-09-22 ENCOUNTER — Ambulatory Visit (HOSPITAL_COMMUNITY)
Admission: RE | Admit: 2014-09-22 | Discharge: 2014-09-22 | Disposition: A | Discharge status: Home / Self Care | Payer: Medicare Other | Source: Ambulatory Visit | Attending: Vascular Surgery | Admitting: Vascular Surgery

## 2014-09-22 ENCOUNTER — Encounter: Payer: Self-pay | Admitting: Vascular Surgery

## 2014-09-22 VITALS — BP 128/100 | HR 72 | Temp 98.2°F | Resp 16 | Ht 76.0 in | Wt 265.0 lb

## 2014-09-22 DIAGNOSIS — Z4931 Encounter for adequacy testing for hemodialysis: Secondary | ICD-10-CM

## 2014-09-22 DIAGNOSIS — N186 End stage renal disease: Secondary | ICD-10-CM | POA: Diagnosis not present

## 2014-09-22 DIAGNOSIS — L97909 Non-pressure chronic ulcer of unspecified part of unspecified lower leg with unspecified severity: Secondary | ICD-10-CM

## 2014-09-22 DIAGNOSIS — I70299 Other atherosclerosis of native arteries of extremities, unspecified extremity: Secondary | ICD-10-CM

## 2014-09-22 NOTE — Progress Notes (Signed)
[facility] [place] Office Progress Note  CC:  F/u revision of left AV fistula; f/u right foot wound Donato Heinz, MD  HPI: This is a 74 y.o. male who presents for follow-up s/p  left cephalic venoplasty x2 and revision of left distal brachiocephalic arteriovenous fistula (Date: 05/28/14). He is currently dialyzing through his fistula on TTS without complications. He was last seen in the [facility] office on 08/18/14 for a right plantar foot wound. He has been receiving daily wound care at his nursing facility. He states that the wound has been improving. He has also been receiving wound care to a right heel wound. He denies any significant pain, mostly numbness to the foot.   Past Medical History  Diagnosis Date  . ESRD on hemodialysis     Started HD around 2009, maybe longer.  Was living in [place], [place] then.  Moved to [place] in 2014 and gets HD now at [facility] on [address] on a TTS schedule.  ESRD was due to DM and HTN.     Marland Kitchen Peripheral vascular disease   . Hypertension   . Hyperlipidemia   . Arthritis     Gout  . Paroxysmal atrial fibrillation   . Brain tumor 2009  . Seizure disorder 2009  . Anemia   . Hyperparathyroidism, secondary   . Diabetes mellitus type 2 with complications     Type II, diet controlled  . Proteus mirabilis infection   . CHF (congestive heart failure)     grade 1 DD, preserved EF, [facility] records  . 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 brain tumor  . GERD (gastroesophageal reflux disease)   . Hx of cardiac cath     a. LHC (9/14):  Normal cors, EF 65%.    Past Surgical History  Procedure Laterality Date  . Av fistula placement Left     arm  . 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 Eye surgery Bilateral 2000    cataract  . Retinopathy surgery      Hx. of laser treatments for diabetics  . Craniectomy / craniotomy for  excision of brain tumor  2009  . Fistulogram Left 05/28/2014    Procedure: FISTULOGRAM (ARM);  Surgeon: Conrad Aldrich, MD;  Location: [place];  Service: Vascular;  Laterality: Left;  Angioplasty of left sephalic vein times two.  . Revision of arteriovenous goretex graft Left 05/28/2014    Procedure: POSSIBLE REVISION OF FISTULA;  Surgeon: Conrad Crowley, MD;  Location: [place];  Service: Vascular;  Laterality: Left;  Revision of Left arm fistula.  Darden Dates without cardioversion N/A 05/29/2014    Procedure: TRANSESOPHAGEAL ECHOCARDIOGRAM (TEE);  Surgeon: Fay Records, MD;  Location: [facility] ENDOSCOPY;  Service: Cardiovascular;  Laterality: N/A;  . Radiology with anesthesia Left 05/31/2014    Procedure: MRI WITH ANESTHESIA, Left hip;  Surgeon: Medication Radiologist, MD;  Location: [place];  Service: Radiology;  Laterality: Left;  Dr Jacinta Shoe ordered  . Left heart catheterization with coronary angiogram Bilateral 05/27/2013    Procedure: LEFT HEART CATHETERIZATION WITH CORONARY ANGIOGRAM;  Surgeon: Burnell Blanks, MD;  Location: [facility] CATH LAB;  Service: Cardiovascular;  Laterality: Bilateral;    No Known Allergies  Current Outpatient Prescriptions  Medication Sig Dispense Refill  . acetaminophen (TYLENOL) 325 MG tablet Take 325 mg by mouth every 6 (six) hours as needed for  moderate pain or fever.    Marland Kitchen aspirin EC 325 MG tablet Take 325 mg by mouth daily.    Marland Kitchen atorvastatin (LIPITOR) 20 MG tablet Take 20 mg by mouth at bedtime.     . B Complex-C-Folic Acid (RENO CAPS) 1 MG CAPS Take 1 mg by mouth at bedtime.    . carbidopa-levodopa (SINEMET IR) 25-100 MG per tablet Take 0.5 tablets by mouth 3 (three) times daily. For dialysis days, give 45 mins prior to HD tx for restless leg syndrome    . Cholecalciferol (VITAMIN D3) 1000 UNITS CAPS Take 1 capsule by mouth daily.    . cinacalcet (SENSIPAR) 30 MG tablet Take 1 tablet (30 mg total) by mouth daily after supper. 60 tablet   . darbepoetin (ARANESP) 60 MCG/0.3ML SOLN  injection Inject 0.3 mLs (60 mcg total) into the vein every Thursday with hemodialysis. 4.2 mL   . divalproex (DEPAKOTE) 500 MG DR tablet Take 3 tablets (1,500 mg total) by mouth daily. 30 tablet 0  . escitalopram (LEXAPRO) 5 MG tablet Take 1 tablet (5 mg total) by mouth daily.    Marland Kitchen gabapentin (NEURONTIN) 300 MG capsule Take 1 capsule (300 mg total) by mouth at bedtime.    Marland Kitchen guaifenesin (ROBITUSSIN) 100 MG/5ML syrup Take 10 mLs (200 mg total) by mouth every 4 (four) hours as needed for congestion. 120 mL 0  . hydrocortisone (ANUSOL-HC) 2.5 % rectal cream Place rectally 2 (two) times daily. 30 g 0  . Menthol, Topical Analgesic, (BIOFREEZE EX) Apply 1 application topically 4 (four) times daily as needed (For neck pain).    . metoprolol succinate (TOPROL-XL) 25 MG 24 hr tablet Take 12.5 mg by mouth at bedtime.    . nitroGLYCERIN (NITROSTAT) 0.4 MG SL tablet Place 1 tablet (0.4 mg total) under the tongue every 5 (five) minutes x 3 doses as needed for chest pain. 30 tablet 12  . omeprazole (PRILOSEC OTC) 20 MG tablet Take 20 mg by mouth daily as needed (heartburn).     Marland Kitchen oxyCODONE-acetaminophen (PERCOCET/ROXICET) 5-325 MG per tablet Take 1 tablet by mouth every 6 (six) hours as needed for severe pain. 30 tablet 0  . senna-docusate (SENOKOT-S) 8.6-50 MG per tablet Take 1 tablet by mouth at bedtime as needed for mild constipation.    . simethicone (MYLICON) 80 MG chewable tablet Chew 80 mg by mouth 2 (two) times daily as needed for flatulence.     No current facility-administered medications for this visit.    Family History  Problem Relation Age of Onset  . Seizures Mother   . Stroke Mother 47  . Hypertension Mother   . Alcohol abuse Father   . Hypertension Father   . Arthritis Sister   . Heart attack Neg Hx   . Heart failure Neg Hx     History   Social History  . Marital Status: Married    Spouse Name: [name]    Number of Children: 3  . Years of Education: 12th   Occupational  History  .      N/A   Social History Main Topics  . Smoking status: Never Smoker   . Smokeless tobacco: Never Used  . Alcohol Use: 0.6 oz/week    1 Shots of liquor per week  . Drug Use: No  . Sexual Activity: No   Other Topics Concern  . Not on file   Social History Narrative   Pt lives at home with spouse.   Caffeine Use: 1-2 cups daily.  Uses a cane for ambulation and prosthetic limb  Left leg.      PHYSICAL EXAMINATION:  Filed Vitals:   09/22/14 1551  BP: 128/100  Pulse: 72  Temp: 98.2 F (36.8 C)  Resp: 16   Body mass index is 32.27 kg/(m^2).  General:  WDWN in NAD Lungs: non-labored breathing, clear to auscultation Cardiac: RRR Skin: Right plantar wound healed. Some dry skin. Right heel with dry callous. No evidence of infection.  Vascular Exam/Pulses:Right foot is warm. No ischemic changes , no palpable pulses, L BC AVF with good thrill and bruit Musculoskeletal: Left BKA prosthesis.   ASSESSMENT: 74 y.o. male who presents for 3 month follow up s/p left cephalic venoplasty x2 and revision of left distal brachiocephalic arteriovenous fistula (Date: 05/28/14) and f/u for right plantar wound.   PLAN: -No issues with dialysis through left arm fistula.  -His right plantar foot wound is healed. He notes a right heel wound that looks more consistent with a callous. No signs of infection. Continue local wound care to heel.  -Follow up on an as needed basis.   Virgina Jock, PA-C [facility] [phone]  Clinic MD:  Pt seen and examined in conjunction with Dr. Bridgett Larsson  Addendum  I have independently interviewed and examined the patient, and I agree with the physician assistant's findings.  L BC AVF fistula appears to be functioning well without any obvious complications at this point.  The prior plantar debridement has healed.  He now has a shallow pressure ulceration of his R heel .  I suspect this patient's peripheral neuropathy limits his  ability to notice pressure on his R foot.  I would recommend keeping him in a protective shoe at all time.  I suspect he may eventually cause loss of his R leg due to pressure ulceration or skin breakdown resulting in infection of his R foot.  Due to his dementia, I doubt he is a surgical candidate and at most, another amputation may be offered.  The patient can follow up with [place] as needed.  Adele Barthel, MD [facility] [place] Office: [phone] Pager: [phone]  09/22/2014, 5:34 PM

## 2014-10-12 ENCOUNTER — Encounter: Payer: Self-pay | Admitting: *Deleted

## 2014-11-13 ENCOUNTER — Telehealth: Payer: Self-pay | Admitting: Vascular Surgery

## 2014-11-13 ENCOUNTER — Ambulatory Visit: Payer: Medicare Other | Admitting: Diagnostic Neuroimaging

## 2014-11-13 NOTE — Telephone Encounter (Signed)
Appointment cancelled with [facility].

## 2014-11-13 NOTE — Telephone Encounter (Signed)
-----   Message from Mena Goes, RN sent at 11/09/2014  3:53 PM EST ----- Regarding: RE: Appointment 03/04 I don't think he needs it, [facility] saw his foot at 09-22-14 appt. And he is not surgical candidate. Is he still a  nursing home patient? They would call us if needed.   ----- Message -----    From: Rufina Falco    Sent: 11/09/2014   1:55 PM      To: Mena Goes, RN Subject: Appointment 03/04                              Is the appointment scheduled on 03/04 old?  Rip Harbour

## 2014-11-17 ENCOUNTER — Ambulatory Visit: Payer: Medicare Other | Admitting: Vascular Surgery

## 2014-11-17 ENCOUNTER — Encounter (HOSPITAL_COMMUNITY): Payer: Medicare Other

## 2014-11-21 ENCOUNTER — Other Ambulatory Visit: Payer: Self-pay | Admitting: *Deleted

## 2014-11-21 ENCOUNTER — Encounter: Payer: Self-pay | Admitting: Vascular Surgery

## 2014-11-21 DIAGNOSIS — I70209 Unspecified atherosclerosis of native arteries of extremities, unspecified extremity: Secondary | ICD-10-CM

## 2014-11-21 DIAGNOSIS — L98499 Non-pressure chronic ulcer of skin of other sites with unspecified severity: Principal | ICD-10-CM

## 2014-11-22 ENCOUNTER — Encounter: Payer: Self-pay | Admitting: Vascular Surgery

## 2014-11-22 ENCOUNTER — Ambulatory Visit (INDEPENDENT_AMBULATORY_CARE_PROVIDER_SITE_OTHER): Payer: Medicare Other | Admitting: Vascular Surgery

## 2014-11-22 ENCOUNTER — Ambulatory Visit: Payer: Self-pay | Admitting: Diagnostic Neuroimaging

## 2014-11-22 ENCOUNTER — Ambulatory Visit (HOSPITAL_COMMUNITY)
Admission: RE | Admit: 2014-11-22 | Discharge: 2014-11-22 | Disposition: A | Discharge status: Home / Self Care | Payer: Medicare Other | Source: Ambulatory Visit | Attending: Vascular Surgery | Admitting: Vascular Surgery

## 2014-11-22 VITALS — BP 86/63 | HR 42 | Resp 16 | Ht 76.0 in | Wt 265.0 lb

## 2014-11-22 DIAGNOSIS — I10 Essential (primary) hypertension: Secondary | ICD-10-CM | POA: Insufficient documentation

## 2014-11-22 DIAGNOSIS — E785 Hyperlipidemia, unspecified: Secondary | ICD-10-CM | POA: Insufficient documentation

## 2014-11-22 DIAGNOSIS — I739 Peripheral vascular disease, unspecified: Secondary | ICD-10-CM | POA: Diagnosis not present

## 2014-11-22 DIAGNOSIS — I70299 Other atherosclerosis of native arteries of extremities, unspecified extremity: Secondary | ICD-10-CM

## 2014-11-22 DIAGNOSIS — Z992 Dependence on renal dialysis: Secondary | ICD-10-CM | POA: Diagnosis not present

## 2014-11-22 DIAGNOSIS — L97909 Non-pressure chronic ulcer of unspecified part of unspecified lower leg with unspecified severity: Secondary | ICD-10-CM

## 2014-11-22 DIAGNOSIS — I70209 Unspecified atherosclerosis of native arteries of extremities, unspecified extremity: Secondary | ICD-10-CM

## 2014-11-22 DIAGNOSIS — E119 Type 2 diabetes mellitus without complications: Secondary | ICD-10-CM | POA: Diagnosis not present

## 2014-11-22 DIAGNOSIS — N186 End stage renal disease: Secondary | ICD-10-CM

## 2014-11-22 DIAGNOSIS — L98499 Non-pressure chronic ulcer of skin of other sites with unspecified severity: Secondary | ICD-10-CM | POA: Diagnosis not present

## 2014-11-22 NOTE — Progress Notes (Signed)
Established Critical Limb Ischemia Patient  History of Present Illness  [name] [name] is a 74 y.o. (03/13/1941) male who presents with chief complaint: poor flow rates in L arm fistula.  The patient has undergone two prior L cephalic venoplasties.  His flow rates have dropped off again.  He denies any steal sx.  The patient also returns for wound check on his right heel wound.  Pt presented previous with new plantar ulcer on his right heel.  The patient is wheelchair bound and unable to feel his feet.  He gets wound care at his SNF.  He denies any fever or chills but does not a smell from the right heel wound.  Past Medical History  Diagnosis Date  . ESRD on hemodialysis     Started HD around 2009, maybe longer.  Was living in [place], [place] then.  Moved to [place] in 2014 and gets HD now at [facility] on [address] on a TTS schedule.  ESRD was due to DM and HTN.     Marland Kitchen Peripheral vascular disease   . Hypertension   . Hyperlipidemia   . Arthritis     Gout  . Paroxysmal atrial fibrillation   . Brain tumor 2009  . Seizure disorder 2009  . Anemia   . Hyperparathyroidism, secondary   . Diabetes mellitus type 2 with complications     Type II, diet controlled  . Proteus mirabilis infection   . CHF (congestive heart failure)     grade 1 DD, preserved EF, [facility] records  . 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 brain tumor  . GERD (gastroesophageal reflux disease)   . Hx of cardiac cath     a. LHC (9/14):  Normal cors, EF 65%.     Past Surgical History  Procedure Laterality Date  . Av fistula placement Left     arm  . 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 Eye surgery Bilateral 2000    cataract  . Retinopathy surgery      Hx. of laser treatments for diabetics  . Craniectomy / craniotomy for excision of brain tumor  2009  . Fistulogram Left 05/28/2014    Procedure:  FISTULOGRAM (ARM);  Surgeon: Conrad [place], MD;  Location: [place];  Service: Vascular;  Laterality: Left;  Angioplasty of left sephalic vein times two.  . Revision of arteriovenous goretex graft Left 05/28/2014    Procedure: POSSIBLE REVISION OF FISTULA;  Surgeon: Conrad [place], MD;  Location: [place];  Service: Vascular;  Laterality: Left;  Revision of Left arm fistula.  Darden Dates without cardioversion N/A 05/29/2014    Procedure: TRANSESOPHAGEAL ECHOCARDIOGRAM (TEE);  Surgeon: Fay Records, MD;  Location: [facility] ENDOSCOPY;  Service: Cardiovascular;  Laterality: N/A;  . Radiology with anesthesia Left 05/31/2014    Procedure: MRI WITH ANESTHESIA, Left hip;  Surgeon: Medication Radiologist, MD;  Location: [place];  Service: Radiology;  Laterality: Left;  Dr Jacinta Shoe ordered  . Left heart catheterization with coronary angiogram Bilateral 05/27/2013    Procedure: LEFT HEART CATHETERIZATION WITH CORONARY ANGIOGRAM;  Surgeon: Burnell Blanks, MD;  Location: [facility] CATH LAB;  Service: Cardiovascular;  Laterality: Bilateral;    History   Social History  . Marital Status: Married    Spouse Name: [name] [name]  . Number of Children: 3  . Years of Education: 12th   Occupational History  .  N/A   Social History Main Topics  . Smoking status: Never Smoker   . Smokeless tobacco: Never Used  . Alcohol Use: 0.6 oz/week    1 Shots of liquor per week  . Drug Use: No  . Sexual Activity: No   Other Topics Concern  . Not on file   Social History Narrative   Pt lives at home with spouse.   Caffeine Use: 1-2 cups daily.   Uses a cane for ambulation and prosthetic limb  Left leg.      Family History  Problem Relation Age of Onset  . Seizures Mother   . Stroke Mother 30  . Hypertension Mother   . Alcohol abuse Father   . Hypertension Father   . Arthritis Sister   . Heart attack Neg Hx   . Heart failure Neg Hx     Current Outpatient Prescriptions on File Prior to Visit  Medication Sig Dispense Refill  .  acetaminophen (TYLENOL) 325 MG tablet Take 325 mg by mouth every 6 (six) hours as needed for moderate pain or fever.    Marland Kitchen aspirin EC 325 MG tablet Take 325 mg by mouth daily.    Marland Kitchen atorvastatin (LIPITOR) 20 MG tablet Take 20 mg by mouth at bedtime.     . B Complex-C-Folic Acid (RENO CAPS) 1 MG CAPS Take 1 mg by mouth at bedtime.    . carbidopa-levodopa (SINEMET IR) 25-100 MG per tablet Take 0.5 tablets by mouth 3 (three) times daily. For dialysis days, give 45 mins prior to HD tx for restless leg syndrome    . Cholecalciferol (VITAMIN D3) 1000 UNITS CAPS Take 1 capsule by mouth daily.    . cinacalcet (SENSIPAR) 30 MG tablet Take 1 tablet (30 mg total) by mouth daily after supper. 60 tablet   . darbepoetin (ARANESP) 60 MCG/0.3ML SOLN injection Inject 0.3 mLs (60 mcg total) into the vein every Thursday with hemodialysis. 4.2 mL   . divalproex (DEPAKOTE) 500 MG DR tablet Take 3 tablets (1,500 mg total) by mouth daily. 30 tablet 0  . escitalopram (LEXAPRO) 5 MG tablet Take 1 tablet (5 mg total) by mouth daily.    Marland Kitchen gabapentin (NEURONTIN) 300 MG capsule Take 1 capsule (300 mg total) by mouth at bedtime.    Marland Kitchen guaifenesin (ROBITUSSIN) 100 MG/5ML syrup Take 10 mLs (200 mg total) by mouth every 4 (four) hours as needed for congestion. 120 mL 0  . hydrocortisone (ANUSOL-HC) 2.5 % rectal cream Place rectally 2 (two) times daily. 30 g 0  . Menthol, Topical Analgesic, (BIOFREEZE EX) Apply 1 application topically 4 (four) times daily as needed (For neck pain).    . metoprolol succinate (TOPROL-XL) 25 MG 24 hr tablet Take 12.5 mg by mouth at bedtime.    . nitroGLYCERIN (NITROSTAT) 0.4 MG SL tablet Place 1 tablet (0.4 mg total) under the tongue every 5 (five) minutes x 3 doses as needed for chest pain. 30 tablet 12  . omeprazole (PRILOSEC OTC) 20 MG tablet Take 20 mg by mouth daily as needed (heartburn).     Marland Kitchen oxyCODONE-acetaminophen (PERCOCET/ROXICET) 5-325 MG per tablet Take 1 tablet by mouth every 6 (six) hours  as needed for severe pain. 30 tablet 0  . senna-docusate (SENOKOT-S) 8.6-50 MG per tablet Take 1 tablet by mouth at bedtime as needed for mild constipation.    . simethicone (MYLICON) 80 MG chewable tablet Chew 80 mg by mouth 2 (two) times daily as needed for flatulence.  No current facility-administered medications on file prior to visit.    No Known Allergies  REVIEW OF SYSTEMS:  (Positives checked otherwise negative)  CARDIOVASCULAR:  []  chest pain, []  chest pressure, []  palpitations, []  shortness of breath when laying flat, []  shortness of breath with exertion,  []  pain in feet when walking, []  pain in feet when laying flat, []  history of blood clot in veins (DVT), []  history of phlebitis, []  swelling in legs, []  varicose veins  PULMONARY:  []  productive cough, []  asthma, []  wheezing  NEUROLOGIC:  []  weakness in arms or legs, []  numbness in arms or legs, []  difficulty speaking or slurred speech, []  temporary loss of vision in one eye, []  dizziness  HEMATOLOGIC:  []  bleeding problems, []  problems with blood clotting too easily  MUSCULOSKEL:  []  joint pain, []  joint swelling  GASTROINTEST:  []  vomiting blood, []  blood in stool     GENITOURINARY:  []  burning with urination, []  blood in urine  PSYCHIATRIC:  []  history of major depression  INTEGUMENTARY:  []  rashes, []  ulcers   Physical Examination  Filed Vitals:   11/22/14 0949  BP: 86/63  Pulse: 42  Resp: 16  Height: 6\' 4"  (1.93 m)  Weight: 265 lb (120.203 kg)   Body mass index is 32.27 kg/(m^2).  General: A&O x 3, WDWN  Eyes: PERRLA, EOMI  Pulmonary: Sym exp, good air movt, CTAB, no rales, rhonchi, & wheezing  Cardiac: RRR, Nl S1, S2, no Murmurs, rubs or gallops  Vascular: Vessel Right Left  Radial Palpable Not Palpable  Brachial Palpable Palpable  Carotid Palpable, without bruit Palpable, without bruit  Aorta Not palpable N/A  Femoral Palpable Palpable  Popliteal Not palpable Not palpable  PT Faintly  Palpable BKA  DP Faintly Palpable BKA   Gastrointestinal: soft, NTND, -G/R, - HSM, - masses, - CVAT B  Musculoskeletal: M/S 5/5 throughout , Extremities without ischemic changes , clean right heel ulcer with granulation tissue  Neurologic: Pain and light touch intact in extremities except feet, Motor exam as listed above  Non-Invasive Vascular Imaging ABI (Date: 11/22/2014)  R:[place], DP: bi, PT: bi  L: BKA   Medical Decision Making  Byrant Valent is a 74 y.o. 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, L arm fistulogram, likely venoplasty of L BC AVF.  Again, I don't think this patient is a good surgical candidate due to his co-morbidities and wheelchair dependence limiting any benefit of revascularization.  In regards to the poor flow rates in the L BC AVF, I suspect recurrent stenosis.  I will arrange for a L arm fistulogram, possible intervention.  I discussed in depth with the patient the nature of atherosclerosis, and emphasized the importance of maximal medical management including strict control of blood pressure, blood glucose, and lipid levels, antiplatelet agents, obtaining regular exercise, and cessation of smoking.    The patient is aware that without maximal medical management the underlying atherosclerotic disease process will progress, limiting the benefit of any interventions. The patient is currently on a statin: Lipitor. The patient is currently on an anti-platelet: ASA.  Thank you for allowing [place] to participate in this patient's care.  Adele Barthel, MD [facility] Office: [phone] Pager: [phone]  11/22/2014, 10:06 AM

## 2014-11-23 NOTE — Addendum Note (Signed)
Addended by: Mena Goes on: 11/23/2014 03:09 PM   Modules accepted: Orders

## 2014-11-24 ENCOUNTER — Other Ambulatory Visit: Payer: Self-pay

## 2014-12-01 ENCOUNTER — Encounter: Payer: Self-pay | Admitting: Adult Health

## 2014-12-01 ENCOUNTER — Non-Acute Institutional Stay (SKILLED_NURSING_FACILITY): Payer: Medicare Other | Admitting: Adult Health

## 2014-12-01 DIAGNOSIS — F32A Depression, unspecified: Secondary | ICD-10-CM

## 2014-12-01 DIAGNOSIS — G20 Parkinson's disease: Secondary | ICD-10-CM

## 2014-12-01 DIAGNOSIS — Z992 Dependence on renal dialysis: Secondary | ICD-10-CM | POA: Diagnosis not present

## 2014-12-01 DIAGNOSIS — E785 Hyperlipidemia, unspecified: Secondary | ICD-10-CM | POA: Diagnosis not present

## 2014-12-01 DIAGNOSIS — L8990 Pressure ulcer of unspecified site, unspecified stage: Secondary | ICD-10-CM | POA: Diagnosis not present

## 2014-12-01 DIAGNOSIS — N189 Chronic kidney disease, unspecified: Secondary | ICD-10-CM

## 2014-12-01 DIAGNOSIS — D631 Anemia in chronic kidney disease: Secondary | ICD-10-CM | POA: Diagnosis not present

## 2014-12-01 DIAGNOSIS — F329 Major depressive disorder, single episode, unspecified: Secondary | ICD-10-CM | POA: Diagnosis not present

## 2014-12-01 DIAGNOSIS — N186 End stage renal disease: Secondary | ICD-10-CM

## 2014-12-01 DIAGNOSIS — I10 Essential (primary) hypertension: Secondary | ICD-10-CM

## 2014-12-01 DIAGNOSIS — G40909 Epilepsy, unspecified, not intractable, without status epilepticus: Secondary | ICD-10-CM

## 2014-12-02 NOTE — Progress Notes (Signed)
Patient ID: [name] [name], male   DOB: [birth year], 74 y.o.   MRN: [id number]   12/01/14  Facility:  [facility] Location:  [place] Room Number: 1006-1 LEVEL OF CARE:  SNF (31)  Routine Visit  Chief Complaint  Patient presents with  . Medical Management of Chronic Issues    Seizure, ESRD, Parkinson's disease, hypertension, anemia, hyperlipidemia, depression and pressure ulcer    HISTORY OF PRESENT ILLNESS:  This is a 74 year old male who is a long-term resident at [facility]. He developed a right heel unstageable ulcer and currently being treated with Santyl. He has PMH of seizure, ESRD, Parkinson's disease, hypertension and hyperlipidemia.  PAST MEDICAL HISTORY:  Past Medical History  Diagnosis Date  . ESRD on hemodialysis     Started HD around 2009, maybe longer.  Was living in [place], [place] then.  Moved to [place] in 2014 and gets HD now at [facility] on [address] on a TTS schedule.  ESRD was due to DM and HTN.     Marland Kitchen Peripheral vascular disease   . Hypertension   . Hyperlipidemia   . Arthritis     Gout  . Paroxysmal atrial fibrillation   . Brain tumor 2009  . Seizure disorder 2009  . Anemia   . Hyperparathyroidism, secondary   . Diabetes mellitus type 2 with complications     Type II, diet controlled  . Proteus mirabilis infection   . CHF (congestive heart failure)     grade 1 DD, preserved EF, [facility] records  . 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 brain tumor  . GERD (gastroesophageal reflux disease)   . Hx of cardiac cath     a. LHC (9/14):  Normal cors, EF 65%.     CURRENT MEDICATIONS: Reviewed per MAR/see medication list  No Known Allergies   REVIEW OF SYSTEMS:  GENERAL: no change in appetite, no fatigue, no weight changes, no fever, chills or weakness RESPIRATORY: no cough, SOB, DOE, wheezing, hemoptysis CARDIAC: no chest pain, edema or palpitations GI: no abdominal  pain, diarrhea, constipation, heart burn, nausea or vomiting  PHYSICAL EXAMINATION  GENERAL: no acute distress, normal body habitus SKIN:  right heel with unstageable ulcer,  has dressing NECK: supple, trachea midline, no neck masses, no thyroid tenderness, no thyromegaly LYMPHATICS: no LAN in the neck, no supraclavicular LAN RESPIRATORY: breathing is even & unlabored, BS CTAB CARDIAC: RRR, no murmur,no extra heart sounds, no edema GI: abdomen soft, normal BS, no masses, no tenderness, no hepatomegaly, no splenomegaly EXTREMITIES: Able to move 4 extremities; left BKA with prosthesis; left upper arm AV shunt, +bruit/thrill PSYCHIATRIC: the patient is alert & oriented to person, affect & behavior appropriate  LABS/RADIOLOGY: 11/23/14  CBC 8.1 hemoglobin 10.1 hematocrit 29.4 MCV 100.0; x-ray of right calcaneous shows no radiographic evidence of acute osseous pathology 10/19/14  total T4 4 0.9 T3 uptake 28 Free thyroxine 1.4 TSH 2.051 albumin 3.3 06/14/14  total protein 6.4 albumin 2.7 total bilirubin 0.3 ALP 76 AST 9 ALT 1 both folic acid 51.7 cholesterol 117 HDL 41 LDL 55 triglycerides 105 06/07/14  WBC 8.6 hemoglobin 7.6 hematocrit 25.0 MCV 105.9 Labs reviewed: Basic Metabolic Panel:  Recent Labs  01/24/14 0846  05/27/14 0759 05/28/14 1640 05/31/14 0856 06/01/14 0500  NA 129*  < > 136* 134* 138 138  K 5.0  < > 4.4 4.9 4.6 4.2  CL 85*  < > 93*  90*  --  94*  CO2 24  < > 26 26  --  27  GLUCOSE 197*  < > 155* 149* 102* 156*  BUN 82*  < > 69* 48*  --  38*  CREATININE 10.04*  < > 8.37* 6.66*  --  7.87*  CALCIUM 9.5  < > 9.6 9.6  --  9.4  PHOS 4.2  --  6.1*  --   --  6.1*  < > = values in this interval not displayed. Liver Function Tests:  Recent Labs  01/15/14 1717  AMMONIA 30   CBC:  Recent Labs  01/16/14 0405  05/18/14 1333  05/28/14 1640 05/30/14 0852 05/31/14 0856 06/01/14 0850  WBC 10.4  < > 7.4  < > 13.9* 12.9*  --  7.8  NEUTROABS 5.5  --  4.1  --   --   --   --    --   HGB 9.5*  < > 10.8*  < > 9.2* 8.3* 8.8* 8.0*  HCT 29.1*  < > 32.2*  < > 27.7* 24.4* 26.0* 24.7*  MCV 96.4  < > 99.1  < > 98.9 97.2  --  99.2  PLT 323  < > 285  < > 389 338  --  343  < > = values in this interval not displayed.  Lipid Panel:  Recent Labs  01/16/14 0405  HDL 32*   CBG:  Recent Labs  01/24/14 0746 05/28/14 1130 05/31/14 1110  GLUCAP 180* 139* 98     ASSESSMENT/PLAN:   Seizure - stable; continue Depakote and Neurontin ESRD - on hemodialysis Parkinson's disease - continue Sinemet Hypertension - well controlled; continue Toprol XL Anemia of chronic kidney disease - continue Aranesp @hemodialysis  Hyperlipidemia -  Continue Lipitor  Depression - continue Lexapro Pressure ulcer on right heel - continue treatment with Santyl and Xeroform dressing   Goals of care:   long-term care   Labs/test ordered:   CMP and Depakote level     [place], [place]

## 2014-12-04 ENCOUNTER — Ambulatory Visit (HOSPITAL_COMMUNITY)
Admission: RE | Admit: 2014-12-04 | Discharge: 2014-12-04 | Disposition: A | Discharge status: Home / Self Care | Payer: Medicare Other | Source: Ambulatory Visit | Attending: Vascular Surgery | Admitting: Vascular Surgery

## 2014-12-04 ENCOUNTER — Encounter (HOSPITAL_COMMUNITY): Payer: Self-pay | Admitting: Vascular Surgery

## 2014-12-04 ENCOUNTER — Other Ambulatory Visit: Payer: Self-pay | Admitting: *Deleted

## 2014-12-04 ENCOUNTER — Encounter (HOSPITAL_COMMUNITY)
Admission: RE | Disposition: A | Discharge status: Home / Self Care | Payer: Self-pay | Source: Ambulatory Visit | Attending: Vascular Surgery

## 2014-12-04 DIAGNOSIS — I739 Peripheral vascular disease, unspecified: Secondary | ICD-10-CM | POA: Diagnosis not present

## 2014-12-04 DIAGNOSIS — N2581 Secondary hyperparathyroidism of renal origin: Secondary | ICD-10-CM | POA: Diagnosis not present

## 2014-12-04 DIAGNOSIS — I871 Compression of vein: Secondary | ICD-10-CM | POA: Insufficient documentation

## 2014-12-04 DIAGNOSIS — E785 Hyperlipidemia, unspecified: Secondary | ICD-10-CM | POA: Diagnosis not present

## 2014-12-04 DIAGNOSIS — Z89512 Acquired absence of left leg below knee: Secondary | ICD-10-CM | POA: Insufficient documentation

## 2014-12-04 DIAGNOSIS — I120 Hypertensive chronic kidney disease with stage 5 chronic kidney disease or end stage renal disease: Secondary | ICD-10-CM | POA: Insufficient documentation

## 2014-12-04 DIAGNOSIS — Z7982 Long term (current) use of aspirin: Secondary | ICD-10-CM | POA: Insufficient documentation

## 2014-12-04 DIAGNOSIS — Z992 Dependence on renal dialysis: Secondary | ICD-10-CM | POA: Diagnosis not present

## 2014-12-04 DIAGNOSIS — I480 Paroxysmal atrial fibrillation: Secondary | ICD-10-CM | POA: Diagnosis not present

## 2014-12-04 DIAGNOSIS — I5032 Chronic diastolic (congestive) heart failure: Secondary | ICD-10-CM | POA: Insufficient documentation

## 2014-12-04 DIAGNOSIS — T829XXA Unspecified complication of cardiac and vascular prosthetic device, implant and graft, initial encounter: Secondary | ICD-10-CM | POA: Diagnosis present

## 2014-12-04 DIAGNOSIS — K219 Gastro-esophageal reflux disease without esophagitis: Secondary | ICD-10-CM | POA: Insufficient documentation

## 2014-12-04 DIAGNOSIS — T82898A Other specified complication of vascular prosthetic devices, implants and grafts, initial encounter: Secondary | ICD-10-CM | POA: Diagnosis not present

## 2014-12-04 DIAGNOSIS — Z4931 Encounter for adequacy testing for hemodialysis: Secondary | ICD-10-CM

## 2014-12-04 DIAGNOSIS — M1990 Unspecified osteoarthritis, unspecified site: Secondary | ICD-10-CM | POA: Insufficient documentation

## 2014-12-04 DIAGNOSIS — Z79899 Other long term (current) drug therapy: Secondary | ICD-10-CM | POA: Diagnosis not present

## 2014-12-04 DIAGNOSIS — N186 End stage renal disease: Secondary | ICD-10-CM

## 2014-12-04 DIAGNOSIS — T82858A Stenosis of vascular prosthetic devices, implants and grafts, initial encounter: Secondary | ICD-10-CM | POA: Diagnosis not present

## 2014-12-04 DIAGNOSIS — Y832 Surgical operation with anastomosis, bypass or graft as the cause of abnormal reaction of the patient, or of later complication, without mention of misadventure at the time of the procedure: Secondary | ICD-10-CM | POA: Insufficient documentation

## 2014-12-04 HISTORY — PX: SHUNTOGRAM: SHX5510

## 2014-12-04 LAB — POCT I-STAT, CHEM 8
BUN: 40 mg/dL — ABNORMAL HIGH (ref 6–23)
CREATININE: 9.6 mg/dL — AB (ref 0.50–1.35)
Calcium, Ion: 1.06 mmol/L — ABNORMAL LOW (ref 1.13–1.30)
Chloride: 95 mmol/L — ABNORMAL LOW (ref 96–112)
Glucose, Bld: 375 mg/dL — ABNORMAL HIGH (ref 70–99)
HCT: 36 % — ABNORMAL LOW (ref 39.0–52.0)
HEMOGLOBIN: 12.2 g/dL — AB (ref 13.0–17.0)
Potassium: 3.4 mmol/L — ABNORMAL LOW (ref 3.5–5.1)
SODIUM: 136 mmol/L (ref 135–145)
TCO2: 25 mmol/L (ref 0–100)

## 2014-12-04 SURGERY — SHUNTOGRAM
Anesthesia: LOCAL | Laterality: Left

## 2014-12-04 MED ORDER — SODIUM CHLORIDE 0.9 % IV SOLN: 250.0000 mL | INTRAVENOUS | Status: DC | PRN

## 2014-12-04 MED ORDER — HEPARIN (PORCINE) IN NACL 2-0.9 UNIT/ML-% IJ SOLN
INTRAMUSCULAR | Status: AC
  Filled 2014-12-04: qty 1000

## 2014-12-04 MED ORDER — LIDOCAINE HCL (PF) 1 % IJ SOLN
INTRAMUSCULAR | Status: AC
  Filled 2014-12-04: qty 30

## 2014-12-04 MED ORDER — SODIUM CHLORIDE 0.9 % IJ SOLN: 3.0000 mL | Freq: Two times a day (BID) | INTRAMUSCULAR | Status: DC

## 2014-12-04 MED ORDER — ACETAMINOPHEN 325 MG PO TABS: 650.0000 mg | ORAL_TABLET | ORAL | Status: DC | PRN

## 2014-12-04 MED ORDER — SODIUM CHLORIDE 0.9 % IJ SOLN: 3.0000 mL | INTRAMUSCULAR | Status: DC | PRN

## 2014-12-04 MED ORDER — HEPARIN SODIUM (PORCINE) 1000 UNIT/ML IJ SOLN
INTRAMUSCULAR | Status: AC
  Filled 2014-12-04: qty 1

## 2014-12-04 NOTE — Interval H&P Note (Signed)
History and Physical Interval Note:  12/04/2014 12:24 PM  [name] [name]  has presented today for surgery, with the diagnosis of END STAGE RENAL  The various methods of treatment have been discussed with the patient and family. After consideration of risks, benefits and other options for treatment, the patient has consented to  Procedure(s): FISTULOGRAM (Left) as a surgical intervention .  The patient's history has been reviewed, patient examined, no change in status, stable for surgery.  I have reviewed the patient's chart and labs.  Questions were answered to the patient's satisfaction.     Fawna Cranmer LIANG-YU

## 2014-12-04 NOTE — Discharge Instructions (Signed)
Venogram, Care After °Refer to this sheet in the next few weeks. These instructions provide you with information on caring for yourself after your procedure. Your health care provider may also give you more specific instructions. Your treatment has been planned according to current medical practices, but problems sometimes occur. Call your health care provider if you have any problems or questions after your procedure. °WHAT TO EXPECT AFTER THE PROCEDURE °After your procedure, it is typical to have the following sensations: °· Mild discomfort at the catheter insertion site. °HOME CARE INSTRUCTIONS  °· Take all medicines exactly as directed. °· Follow any prescribed diet. °· Follow instructions regarding both rest and physical activity. °· Drink more fluids for the first several days after the procedure in order to help flush dye from your kidneys. °SEEK MEDICAL CARE IF: °· You develop a rash. °· You have fever not controlled by medicine. °SEEK IMMEDIATE MEDICAL CARE IF: °· There is pain, drainage, bleeding, redness, swelling, warmth or a red streak at the site of the IV tube. °· The extremity where your IV tube was placed becomes discolored, numb, or cool. °· You have difficulty breathing or shortness of breath. °· You develop chest pain. °· You have excessive dizziness or fainting. °Document Released: 06/22/2013 Document Revised: 09/06/2013 Document Reviewed: 06/22/2013 °ExitCare® Patient Information ©2015 [employer]. This information is not intended to replace advice given to you by your health care provider. Make sure you discuss any questions you have with your health care provider. ° °

## 2014-12-04 NOTE — H&P (View-Only) (Signed)
Established Critical Limb Ischemia Patient  History of Present Illness  [name] [name] is a 74 y.o. (31-Dec-1940) male who presents with chief complaint: poor flow rates in L arm fistula.  The patient has undergone two prior L cephalic venoplasties.  His flow rates have dropped off again.  He denies any steal sx.  The patient also returns for wound check on his right heel wound.  Pt presented previous with new plantar ulcer on his right heel.  The patient is wheelchair bound and unable to feel his feet.  He gets wound care at his SNF.  He denies any fever or chills but does not a smell from the right heel wound.  Past Medical History  Diagnosis Date  . ESRD on hemodialysis     Started HD around 2009, maybe longer.  Was living in [place], [place] then.  Moved to [place] in 2014 and gets HD now at [facility] on [address] on a TTS schedule.  ESRD was due to DM and HTN.     Marland Kitchen Peripheral vascular disease   . Hypertension   . Hyperlipidemia   . Arthritis     Gout  . Paroxysmal atrial fibrillation   . Brain tumor 2009  . Seizure disorder 2009  . Anemia   . Hyperparathyroidism, secondary   . Diabetes mellitus type 2 with complications     Type II, diet controlled  . Proteus mirabilis infection   . CHF (congestive heart failure)     grade 1 DD, preserved EF, [facility] records  . 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 brain tumor  . GERD (gastroesophageal reflux disease)   . Hx of cardiac cath     a. LHC (9/14):  Normal cors, EF 65%.     Past Surgical History  Procedure Laterality Date  . Av fistula placement Left     arm  . 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 Eye surgery Bilateral 2000    cataract  . Retinopathy surgery      Hx. of laser treatments for diabetics  . Craniectomy / craniotomy for excision of brain tumor  2009  . Fistulogram Left 05/28/2014    Procedure:  FISTULOGRAM (ARM);  Surgeon: Conrad Branford Center, MD;  Location: [place];  Service: Vascular;  Laterality: Left;  Angioplasty of left sephalic vein times two.  . Revision of arteriovenous goretex graft Left 05/28/2014    Procedure: POSSIBLE REVISION OF FISTULA;  Surgeon: Conrad , MD;  Location: [place];  Service: Vascular;  Laterality: Left;  Revision of Left arm fistula.  Darden Dates without cardioversion N/A 05/29/2014    Procedure: TRANSESOPHAGEAL ECHOCARDIOGRAM (TEE);  Surgeon: Fay Records, MD;  Location: [facility] ENDOSCOPY;  Service: Cardiovascular;  Laterality: N/A;  . Radiology with anesthesia Left 05/31/2014    Procedure: MRI WITH ANESTHESIA, Left hip;  Surgeon: Medication Radiologist, MD;  Location: [place];  Service: Radiology;  Laterality: Left;  Dr Jacinta Shoe ordered  . Left heart catheterization with coronary angiogram Bilateral 05/27/2013    Procedure: LEFT HEART CATHETERIZATION WITH CORONARY ANGIOGRAM;  Surgeon: Burnell Blanks, MD;  Location: [facility] CATH LAB;  Service: Cardiovascular;  Laterality: Bilateral;    History   Social History  . Marital Status: Married    Spouse Name: [name] [name]  . Number of Children: 3  . Years of Education: 12th   Occupational History  .  N/A   Social History Main Topics  . Smoking status: Never Smoker   . Smokeless tobacco: Never Used  . Alcohol Use: 0.6 oz/week    1 Shots of liquor per week  . Drug Use: No  . Sexual Activity: No   Other Topics Concern  . Not on file   Social History Narrative   Pt lives at home with spouse.   Caffeine Use: 1-2 cups daily.   Uses a cane for ambulation and prosthetic limb  Left leg.      Family History  Problem Relation Age of Onset  . Seizures Mother   . Stroke Mother 27  . Hypertension Mother   . Alcohol abuse Father   . Hypertension Father   . Arthritis Sister   . Heart attack Neg Hx   . Heart failure Neg Hx     Current Outpatient Prescriptions on File Prior to Visit  Medication Sig Dispense Refill  .  acetaminophen (TYLENOL) 325 MG tablet Take 325 mg by mouth every 6 (six) hours as needed for moderate pain or fever.    Marland Kitchen aspirin EC 325 MG tablet Take 325 mg by mouth daily.    Marland Kitchen atorvastatin (LIPITOR) 20 MG tablet Take 20 mg by mouth at bedtime.     . B Complex-C-Folic Acid (RENO CAPS) 1 MG CAPS Take 1 mg by mouth at bedtime.    . carbidopa-levodopa (SINEMET IR) 25-100 MG per tablet Take 0.5 tablets by mouth 3 (three) times daily. For dialysis days, give 45 mins prior to HD tx for restless leg syndrome    . Cholecalciferol (VITAMIN D3) 1000 UNITS CAPS Take 1 capsule by mouth daily.    . cinacalcet (SENSIPAR) 30 MG tablet Take 1 tablet (30 mg total) by mouth daily after supper. 60 tablet   . darbepoetin (ARANESP) 60 MCG/0.3ML SOLN injection Inject 0.3 mLs (60 mcg total) into the vein every Thursday with hemodialysis. 4.2 mL   . divalproex (DEPAKOTE) 500 MG DR tablet Take 3 tablets (1,500 mg total) by mouth daily. 30 tablet 0  . escitalopram (LEXAPRO) 5 MG tablet Take 1 tablet (5 mg total) by mouth daily.    Marland Kitchen gabapentin (NEURONTIN) 300 MG capsule Take 1 capsule (300 mg total) by mouth at bedtime.    Marland Kitchen guaifenesin (ROBITUSSIN) 100 MG/5ML syrup Take 10 mLs (200 mg total) by mouth every 4 (four) hours as needed for congestion. 120 mL 0  . hydrocortisone (ANUSOL-HC) 2.5 % rectal cream Place rectally 2 (two) times daily. 30 g 0  . Menthol, Topical Analgesic, (BIOFREEZE EX) Apply 1 application topically 4 (four) times daily as needed (For neck pain).    . metoprolol succinate (TOPROL-XL) 25 MG 24 hr tablet Take 12.5 mg by mouth at bedtime.    . nitroGLYCERIN (NITROSTAT) 0.4 MG SL tablet Place 1 tablet (0.4 mg total) under the tongue every 5 (five) minutes x 3 doses as needed for chest pain. 30 tablet 12  . omeprazole (PRILOSEC OTC) 20 MG tablet Take 20 mg by mouth daily as needed (heartburn).     Marland Kitchen oxyCODONE-acetaminophen (PERCOCET/ROXICET) 5-325 MG per tablet Take 1 tablet by mouth every 6 (six) hours  as needed for severe pain. 30 tablet 0  . senna-docusate (SENOKOT-S) 8.6-50 MG per tablet Take 1 tablet by mouth at bedtime as needed for mild constipation.    . simethicone (MYLICON) 80 MG chewable tablet Chew 80 mg by mouth 2 (two) times daily as needed for flatulence.  No current facility-administered medications on file prior to visit.    No Known Allergies  REVIEW OF SYSTEMS:  (Positives checked otherwise negative)  CARDIOVASCULAR:  []  chest pain, []  chest pressure, []  palpitations, []  shortness of breath when laying flat, []  shortness of breath with exertion,  []  pain in feet when walking, []  pain in feet when laying flat, []  history of blood clot in veins (DVT), []  history of phlebitis, []  swelling in legs, []  varicose veins  PULMONARY:  []  productive cough, []  asthma, []  wheezing  NEUROLOGIC:  []  weakness in arms or legs, []  numbness in arms or legs, []  difficulty speaking or slurred speech, []  temporary loss of vision in one eye, []  dizziness  HEMATOLOGIC:  []  bleeding problems, []  problems with blood clotting too easily  MUSCULOSKEL:  []  joint pain, []  joint swelling  GASTROINTEST:  []  vomiting blood, []  blood in stool     GENITOURINARY:  []  burning with urination, []  blood in urine  PSYCHIATRIC:  []  history of major depression  INTEGUMENTARY:  []  rashes, []  ulcers   Physical Examination  Filed Vitals:   11/22/14 0949  BP: 86/63  Pulse: 42  Resp: 16  Height: 6\' 4"  (1.93 m)  Weight: 265 lb (120.203 kg)   Body mass index is 32.27 kg/(m^2).  General: A&O x 3, WDWN  Eyes: PERRLA, EOMI  Pulmonary: Sym exp, good air movt, CTAB, no rales, rhonchi, & wheezing  Cardiac: RRR, Nl S1, S2, no Murmurs, rubs or gallops  Vascular: Vessel Right Left  Radial Palpable Not Palpable  Brachial Palpable Palpable  Carotid Palpable, without bruit Palpable, without bruit  Aorta Not palpable N/A  Femoral Palpable Palpable  Popliteal Not palpable Not palpable  PT Faintly  Palpable BKA  DP Faintly Palpable BKA   Gastrointestinal: soft, NTND, -G/R, - HSM, - masses, - CVAT B  Musculoskeletal: M/S 5/5 throughout , Extremities without ischemic changes , clean right heel ulcer with granulation tissue  Neurologic: Pain and light touch intact in extremities except feet, Motor exam as listed above  Non-Invasive Vascular Imaging ABI (Date: 11/22/2014)  R:[place], DP: bi, PT: bi  L: BKA   Medical Decision Making  [name] [name] is a 74 y.o. 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  Again, I don't think this patient is a good surgical candidate due to his co-morbidities and wheelchair dependence limiting any benefit of revascularization.  In regards to the poor flow rates in the L BC AVF, I suspect recurrent stenosis.  I will arrange for a L arm fistulogram, possible intervention.  I discussed in depth with the patient the nature of atherosclerosis, and emphasized the importance of maximal medical management including strict control of blood pressure, blood glucose, and lipid levels, antiplatelet agents, obtaining regular exercise, and cessation of smoking.    The patient is aware that without maximal medical management the underlying atherosclerotic disease process will progress, limiting the benefit of any interventions. The patient is currently on a statin: Lipitor. The patient is currently on an anti-platelet: ASA.  Thank you for allowing [place] to participate in this patient's care.  Adele Barthel, MD [facility] Office: [phone] Pager: [phone]  11/22/2014, 10:06 AM

## 2014-12-04 NOTE — Op Note (Signed)
OPERATIVE NOTE   PROCEDURE: 1.  left brachiocephalic arteriovenous fistula cannulation under ultrasound guidance 2.  left arm fistulogram 3.  Venoplasty of cephalic vein x 2 (6 mm x 80 mm, 8 mm x 80 mm)  PRE-OPERATIVE DIAGNOSIS: Malfunctioning left arteriovenous fistula  POST-OPERATIVE DIAGNOSIS: same as above   SURGEON: Adele Barthel, MD  ANESTHESIA: local  ESTIMATED BLOOD LOSS: 5 cc  FINDING(S): 1. Patent left central venous structures 2. Patent proximal cephalic vein stent 3. <23% stenosis distal to stent 4. Mid-segment stenosis >90%: resolved (7 mm residual lumen) 5. Tapered distal fistula: likely due to cannulation and anesthesia injection as no stenosis found on ultrasound prior to cannulation  SPECIMEN(S):  None  CONTRAST: 35 cc  INDICATIONS: [name] [name] is a 74 y.o. male who presents with malfunctioning left brachiocephalic arteriovenous fistula.  The patient is scheduled for left arm fistulogram.  The patient is aware the risks include but are not limited to: bleeding, infection, thrombosis of the cannulated access, and possible anaphylactic reaction to the contrast.  The patient is aware of the risks of the procedure and elects to proceed forward.  DESCRIPTION: After full informed written consent was obtained, the patient was brought back to the angiography suite and placed supine upon the angiography table.  The patient was connected to monitoring equipment.  The left upper arm was prepped and draped in the standard fashion for a percutaneous access intervention.  Under ultrasound guidance, the left brachiocephalic arteriovenous fistula was cannulated with a micropuncture needle.  The microwire was advanced into the fistula and the needle was exchanged for the a microsheath, which was lodged 2 cm into the access.  The wire was removed and the sheath was connected to the IV extension tubing.  Hand injections were completed to image the access from the antecubitum  up to the level of axilla.  The central venous structures were also imaged by hand injections.  Based on the images, this patient will need: venoplasty.  A Benson wire was advanced into the axillary vein and the sheath was exchanged for a short 6-Fr sheath.  Patient was given 3000 units of Heparin.  Based on the imaging, a 6 mm x 80 mm angioplasty balloon was selected.  The balloon was centered around the mid-segment stenosis and inflated to 18 ATM for 1 minute.   On completion imaging, a >30% residual stenosis was present.  At this point, the balloon was exchanged for a 8 mm x 80 mm angioplasty balloon.  The balloon was centered around the mid-segment stenosis and inflated to 18 ATM for 1 minute.  I also inflated the balloon in the stenotic segment distal to the stent.  On completion imaging, a >30% residual stenosis was present.  I suspected this was due to spasm, as the fistula had resumption of a normal thrill.  I replaced the balloon and inflated to 20 atm for 2 minutes, centered on the mid-segment stenosis.  I removed the balloon and on completion injection, there was resolution of the stenosis with 7 mm lumen.  Based on the completion imaging, no further intervention is necessary.  The wire and balloon were removed from the sheath.  A 4-0 Monocryl purse-string suture was sewn around the sheath.  The sheath was removed while tying down the suture.  A sterile bandage was applied to the puncture site.  COMPLICATIONS: none  CONDITION: stable   Adele Barthel, MD [facility] Office: [phone] Pager: [phone]  12/04/2014 2:03  PM

## 2014-12-06 ENCOUNTER — Encounter: Payer: Self-pay | Admitting: Diagnostic Neuroimaging

## 2014-12-06 ENCOUNTER — Telehealth: Payer: Self-pay | Admitting: Vascular Surgery

## 2014-12-06 ENCOUNTER — Ambulatory Visit (INDEPENDENT_AMBULATORY_CARE_PROVIDER_SITE_OTHER): Payer: Medicare Other | Admitting: Diagnostic Neuroimaging

## 2014-12-06 VITALS — BP 121/73 | HR 78 | Ht 76.0 in

## 2014-12-06 DIAGNOSIS — N186 End stage renal disease: Secondary | ICD-10-CM

## 2014-12-06 DIAGNOSIS — R251 Tremor, unspecified: Secondary | ICD-10-CM | POA: Diagnosis not present

## 2014-12-06 DIAGNOSIS — C712 Malignant neoplasm of temporal lobe: Secondary | ICD-10-CM

## 2014-12-06 DIAGNOSIS — G40209 Localization-related (focal) (partial) symptomatic epilepsy and epileptic syndromes with complex partial seizures, not intractable, without status epilepticus: Secondary | ICD-10-CM | POA: Diagnosis not present

## 2014-12-06 DIAGNOSIS — Z992 Dependence on renal dialysis: Secondary | ICD-10-CM | POA: Diagnosis not present

## 2014-12-06 MED ORDER — CARBIDOPA-LEVODOPA 25-100 MG PO TABS: 1.0000 | ORAL_TABLET | Freq: Three times a day (TID) | ORAL | Status: DC

## 2014-12-06 MED ORDER — DIVALPROEX SODIUM ER 500 MG PO TB24: 1500.0000 mg | ORAL_TABLET | Freq: Every day | ORAL | Status: DC

## 2014-12-06 NOTE — Patient Instructions (Signed)
Increase carbidopa-levodopa up to 1 tab three times per day.

## 2014-12-06 NOTE — Telephone Encounter (Addendum)
-----   Message from Mena Goes, RN sent at 12/04/2014  3:46 PM EDT ----- Regarding: Schedule   ----- Message -----    From: Conrad [place]ife[place], MD    Sent: 12/04/2014   2:09 PM      To: [facility] Charge Daneil Beem [phone] 1941-08-24  Procedure:  1.  Left brachiocephalic arteriovenous fistula  Cannulation under utlrasound guidance 2.  Left arm fistulogram 3.  Venoplasty cephalic vein x  2  Follow-up: 3 months  Orders(s) for follow-up: L access duplex   12/06/14: patient was already scheduled 03/09/15 for abi and MD appt- so I combined the appointments to save him a trip. I have left him a detailed message with new appt time for all appointments, dpm

## 2014-12-06 NOTE — Progress Notes (Signed)
PATIENT: [name] [name] DOB: [birth year]   REASON FOR VISIT: follow up for complex partial seizures HISTORY FROM: patient and wife; also spoke with daughter Public affairs consultant) via phone during evaluation  Chief Complaint  Patient presents with  . Follow-up    tremor     HISTORY OF PRESENT ILLNESS:  UPDATE 12/06/14: Since last visit, patient has had significant decline, multiple ER and hospital visits, for various issues including tremors, AMS, PNA, transfer to [facility]. Also developed right foot ulcer. Now essentially wheelchair bound. Daughter has noted increasing memory problems and tremors.   UPDATE 05/15/14: Since last visit, carb/levo has helped his tremor. Then in May 2015, developed confusion, encephalopathy, left arm weakness, right leg pain. Has severe cervical and lumbar spine stenosis (opted for conservative mgmt and PT). Also had UTI. Now doing better.  Last Wed, had left > right arm shaking (39mnutes), no LOC, no lower ext shaking.   UPDATE 09/22/13: Since last visit, has started to develop action and postural, occ resting, tremor in right > left hand. He discussed with renal doctor who recommended follow up here. No seizures.  UPDATE 08/16/13 (LL): Since last visit, patient was having chest pain and had Left heart Cardiac catheterization 05/27/13 which demonstrated normal coronary arteries.  He was later taken to the emergency room for evaluation of possible TIA or stroke on 06/19/13. Patient had intermittent episodes of shaking of his extremities on day of admission. During one of those episodes he had a 15 minute period of slurred speech which was noticed by his wife. Symptoms lasted about 15 minutes then resolved with no recurrence, even though he's had recurrent episodes of shaking of his extremities.  Stroke workup was negative.  His valproic acid level was low and he was increased to divalproex ER 15044mdaily for seizure control.  Just started rehab last week at his assisted  living facility, twice a week for walking/balance.  Looking back over the last year retrospectively patient feels like discord between his wife and himself caused many of his physical complaints.  He has no seizures or other shaking/ neuro symptoms since leaving the hospital. They are living apart now and he feels the best he has felt in a long time, he states.  UPDATE 03/30/13 (VP): Since last visit, dilantin level was very low, so I started divalproex ER 100018maily, and then tapered dilantin off. He is doing much better now. No further seizures. He has some periodic limb movements of sleep type kicks at night. No day time seizures. Short term memory still affected. Also went to ER in June for fever, found to have UTI.   UPDATE 02/09/13 (VP): Since last visit, still having 3-4 episodes per week, with slurred speech, random jerking movements, but cannot talk, but able to hear and stay awake. Episodes do not seem to be correlated with dialysis timing. Patient did not bring his medicines for me to review. Is not sure about his Dilantin dosing, but he thinks he takes 50 mg tablets, 10 tablets every morning. [facility]12, which shows stable right temporal post surgical changes, and is stable from MRI on 01/07/11. He did not see them in clinic.   Also, I received prior records, which I summarize as follows:  Patient had 2 focal seizures in January 2009. These initially consisted of feeling off balance, disorientation, difficulty speaking, left-sided numbness. He underwent stereotactic [facility] demonstrating  a well-differentiated astrocytoma. Patient was then referred to [facility] for definitive treatment. He underwent right temporal lobe tumor resection (11/09/07, Dr. Claybon Jabs, [place]) and pathology demonstrated oligoastrocytoma. He then followed up at [place] on 12/29/07. Additional pathology studies: Ki-67 4%, MGMT  25% (MGMT promoter methylation negative), 1p intact, 19q loss. No further radiation or chemotherapy was suggested. Followup MRIs were recommended over time. He follow up in 2010 multiple times with stable MRIs, then not again in [place] clinic until 07/15/12. Last 2 MRIs were 01/07/11 and 11/10/12, which have been stable. Has not been back to [facility] for clinic evaluation since 07/15/12.   PRIOR HPI (10/22/12): 73 year old right-handed male with history of diabetes, paroxysmal atrial fibrillation, gout, end-stage renal disease on hemodialysis, depression, anxiety, brain tumor (patient not sure what type) here for evaluation of slurred speech trouble talking, trouble swallowing and ringing in the ears. Patient is here with his wife. Patient previously lived in [place], treated at [facility] for a brain tumor. He points to the right frontal region of his head. Is not sure if this was a benign or malignant tumor. Patient states that he was having left leg weakness problems, diagnosed with a brain tumor. Apparently this was treated at [facility] with biopsy and ultimately resection. He does not recall chemotherapy or radiation treatments. [facility] and [facility]y but does not know with whom. Last MRI scan was in April 2012. Last visit with neurologist was 4 months ago. Patient also reports history of seizures, around the time of diagnosis of brain tumor. Patient describes seizures as episodes where he is unable to talk, foaming at the mouth, but awake, aware, able to hear. No convulsions. Patient has been treated with Dilantin. He reports taking 10 tablets of Dilantin the morning and 5 tablets at night. This is contradictory to his referring notes where he is apparently on 100 mg 3 times a day. Patient is not sure of the strength of his tablets.   REVIEW OF SYSTEMS: Full 14 system review of systems performed and notable only for activity change  fatigue hearing loss light sensitivity eye pain constipation incontinence memory loss headache specifically tremor confusion decreased concentration depression walking difficulty neck pain next stiffness.   ALLERGIES: No Known Allergies  HOME MEDICATIONS: Outpatient Prescriptions Prior to Visit  Medication Sig Dispense Refill  . aspirin EC 325 MG tablet Take 325 mg by mouth daily.    Marland Kitchen atorvastatin (LIPITOR) 20 MG tablet Take 20 mg by mouth at bedtime.     . B Complex-C-Folic Acid (NEPHROCAPS PO) Take 1 capsule by mouth daily at 6 PM.    . Cholecalciferol (VITAMIN D3) 1000 UNITS CAPS Take 1,000 Units by mouth daily.     . cinacalcet (SENSIPAR) 30 MG tablet Take 1 tablet (30 mg total) by mouth daily after supper. 60 tablet   . darbepoetin (ARANESP) 60 MCG/0.3ML SOLN injection Inject 0.3 mLs (60 mcg total) into the vein every Thursday with hemodialysis. 4.2 mL   . escitalopram (LEXAPRO) 5 MG tablet Take 1 tablet (5 mg total) by mouth daily.    Marland Kitchen gabapentin (NEURONTIN) 300 MG capsule Take 1 capsule (300 mg total) by mouth at bedtime.    . hydrocortisone (ANUSOL-HC) 25 MG suppository Place 25 mg rectally daily as needed for hemorrhoids or itching.    . Menthol, Topical Analgesic, 4 % GEL Apply 1 application topically 4 (four) times daily as needed (neck  pain).    . metoprolol succinate (TOPROL-XL) 25 MG 24 hr tablet Take 12.5 mg by mouth at bedtime.    . Multiple Vitamins-Minerals (DECUBI-VITE) CAPS Take 1 capsule by mouth daily.    Marland Kitchen oxyCODONE-acetaminophen (PERCOCET/ROXICET) 5-325 MG per tablet Take 1 tablet by mouth every 6 (six) hours as needed for severe pain. 30 tablet 0  . simethicone (MYLICON) 80 MG chewable tablet Chew 80 mg by mouth 2 (two) times daily as needed for flatulence.    . sucroferric oxyhydroxide (VELPHORO) 500 MG chewable tablet Chew 1,500 mg by mouth 3 (three) times daily before meals.    . carbidopa-levodopa (SINEMET IR) 25-100 MG per tablet Take 0.5 tablets by mouth 3  (three) times daily. For dialysis days, give 45 mins prior to HD tx for restless leg syndrome    . divalproex (DEPAKOTE) 500 MG DR tablet Take 3 tablets (1,500 mg total) by mouth daily. 30 tablet 0  . nitroGLYCERIN (NITROSTAT) 0.4 MG SL tablet Place 1 tablet (0.4 mg total) under the tongue every 5 (five) minutes x 3 doses as needed for chest pain. (Patient not taking: Reported on 12/06/2014) 30 tablet 12   No facility-administered medications prior to visit.     PAST MEDICAL HISTORY: Past Medical History  Diagnosis Date  . ESRD on hemodialysis     Started HD around 2009, maybe longer.  Was living in [place], [place] then.  Moved to [place] in 2014 and gets HD now at [facility] on [address] on a TTS schedule.  ESRD was due to DM and HTN.     Marland Kitchen Peripheral vascular disease   . Hypertension   . Hyperlipidemia   . Arthritis     Gout  . Paroxysmal atrial fibrillation   . Brain tumor 2009  . Seizure disorder 2009  . Anemia   . Hyperparathyroidism, secondary   . Diabetes mellitus type 2 with complications     Type II, diet controlled  . Proteus mirabilis infection   . CHF (congestive heart failure)     grade 1 DD, preserved EF, [facility] records  . 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 brain tumor  . GERD (gastroesophageal reflux disease)   . Hx of cardiac cath     a. LHC (9/14):  Normal cors, EF 65%.     PAST SURGICAL HISTORY: Past Surgical History  Procedure Laterality Date  . Av fistula placement Left     arm  . 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 Eye surgery Bilateral 2000    cataract  . Retinopathy surgery      Hx. of laser treatments for diabetics  . Craniectomy / craniotomy for excision of brain tumor  2009  . Fistulogram Left 05/28/2014    Procedure: FISTULOGRAM (ARM);  Surgeon: Conrad Natural Steps, MD;  Location: [place];  Service: Vascular;  Laterality: Left;  Angioplasty of  left sephalic vein times two.  . Revision of arteriovenous goretex graft Left 05/28/2014    Procedure: POSSIBLE REVISION OF FISTULA;  Surgeon: Conrad Bellbrook, MD;  Location: [place];  Service: Vascular;  Laterality: Left;  Revision of Left arm fistula.  Darden Dates without cardioversion N/A 05/29/2014    Procedure: TRANSESOPHAGEAL ECHOCARDIOGRAM (TEE);  Surgeon: Fay Records, MD;  Location: [facility] ENDOSCOPY;  Service: Cardiovascular;  Laterality: N/A;  . Radiology with anesthesia Left 05/31/2014    Procedure:  MRI WITH ANESTHESIA, Left hip;  Surgeon: Medication Radiologist, MD;  Location: [place];  Service: Radiology;  Laterality: Left;  Dr Jacinta Shoe ordered  . Left heart catheterization with coronary angiogram Bilateral 05/27/2013    Procedure: LEFT HEART CATHETERIZATION WITH CORONARY ANGIOGRAM;  Surgeon: Burnell Blanks, MD;  Location: [facility] CATH LAB;  Service: Cardiovascular;  Laterality: Bilateral;  . Shuntogram Left 12/04/2014    Procedure: FISTULOGRAM;  Surgeon: Conrad De Pere, MD;  Location: [facility] CATH LAB;  Service: Cardiovascular;  Laterality: Left;    FAMILY HISTORY: Family History  Problem Relation Age of Onset  . Seizures Mother   . Stroke Mother 12  . Hypertension Mother   . Alcohol abuse Father   . Hypertension Father   . Arthritis Sister   . Heart attack Neg Hx   . Heart failure Neg Hx     SOCIAL HISTORY: History   Social History  . Marital Status: Married    Spouse Name: [name]  . Number of Children: 3  . Years of Education: 12th   Occupational History  .      N/A   Social History Main Topics  . Smoking status: Never Smoker   . Smokeless tobacco: Never Used  . Alcohol Use: No  . Drug Use: No  . Sexual Activity: No   Other Topics Concern  . Not on file   Social History Narrative   Pt lives at [facility]    Caffeine Use: 1-2 cups daily.   Uses wheelchair for ambulation and prosthetic limb  Left leg.       PHYSICAL EXAM  Filed Vitals:   12/06/14  1427  BP: 121/73  Pulse: 78  Height: _0  (1.93 m)   Body mass index is 0.00 kg/(m^2).  Generalized: Well developed, in no acute distress  Neck: Supple, no carotid bruits  Cardiac: Regular rate rhythm, no murmur  Musculoskeletal: LEFT BELOW KNEE AMPUTATION.   Neurologic Exam  Mental Status: Awake, alert. Language is fluent and comprehension intact.  Cranial Nerves:  Pupils are equal and reactive to light. Visual fields are full to confrontation, EXCEPT LEFT SUPERIOR QUADRANTANOPSIA. Conjugate eye movements are full and symmetric. Facial sensation and strength are symmetric. Hearing is intact. Palate elevated symmetrically and uvula is midline. Shoulder shrug is symmetric. Tongue is midline.  Motor: ACTION > POSTURAL TREMOR (LUE > RUE). INTERMITTENT MOUTH/CHIN TREMOR. MILD BRADYKINESIA IN BUE. Full strength in the upper and lower extremities. LEFT BELOW KNEE AMPUTATION. No pronator drift.  Sensory: DECR VIB AT TOES.  Coordination: No ataxia or dysmetria on finger-nose or rapid alternating movement testing.  Gait and Station: UNSTEADY GAIT, ambulates with cane, has left leg prosthesis. Reflexes: BUE TRACE, RIGHT KNEE TRACE, RIGHT ANKLE 0. LEFT BELOW KNEE AMPUTATION.  DIAGNOSTIC DATA (LABS, IMAGING, TESTING) - I reviewed patient records, labs, notes, testing and imaging myself where available.  Lab Results  Component Value Date   WBC 7.8 06/01/2014   HGB 12.2* 12/04/2014   HCT 36.0* 12/04/2014   MCV 99.2 06/01/2014   PLT 343 06/01/2014      Component Value Date/Time   NA 136 12/04/2014 1218   NA 138 02/09/2013 1350   K 3.4* 12/04/2014 1218   CL 95* 12/04/2014 1218   CO2 27 06/01/2014 0500   GLUCOSE 375* 12/04/2014 1218   GLUCOSE 137* 02/09/2013 1350   BUN 40* 12/04/2014 1218   BUN 25 02/09/2013 1350   CREATININE 9.60* 12/04/2014 1218   CALCIUM 9.4 06/01/2014 0500  PROT 7.7 05/23/2014 1156   PROT 6.8 02/09/2013 1350   ALBUMIN 2.4* 06/01/2014 0500   AST 12 05/23/2014 1156    ALT <5 05/23/2014 1156   ALKPHOS 66 05/23/2014 1156   BILITOT 0.2* 05/23/2014 1156   GFRNONAA 6* 06/01/2014 0500   GFRAA 7* 06/01/2014 0500   Lab Results  Component Value Date   CHOL 123 01/16/2014   HDL 32* 01/16/2014   LDLCALC 66 01/16/2014   TRIG 125 01/16/2014   CHOLHDL 3.8 01/16/2014   Lab Results  Component Value Date   HGBA1C 5.8* 01/16/2014   Lab Results  Component Value Date   PHENYTOIN 3.1* 02/15/2013   VALPROATE 33.7* 05/23/2014    03/17/13 EEG - moderate diffuse slowing with intermittent frontal rhythmic activity (FIRDA) consistent with moderate encephalopathy. No focal or epileptiform activity is seen. No electrographic seizures are recorded.   01/16/14 EEG - mild to moderate generalized continuous nonspecific slowing of cerebral activity. This pattern of slowing can be seen with degenerative as well as metabolic and toxic encephalopathies. No evidence of an epileptic disorder was demonstrated.  01/20/14 MRI brain - nothing acute; prior right temporal craniotomy with encephalomalacia in the right temporal lobe which is unchanged. There is generalized atrophy.  01/23/14 MRI cervical spine - Multilevel cervical spondylosis. The patient has a congenitally small canal with superimposed spinal stenosis which is most severe at C3-4 and C4-5.  01/16/14 MRI lumbar spine - The patient has a congenitally small spinal canal. There is superimposed degenerative change and spinal stenosis at multiple levels. Spinal stenosis most severe at L3-4 and L4-5. Small left foraminal disc protrusion at L2-3.   ASSESSMENT AND PLAN  74 y.o. male with intermittent slurrred speech, jerking movements, ringing in ears, could be partial seizures. Much better controlled with divalproex than with dilantin. 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 Also with worsening memory loss.   Dx:  right temporal oligoastrocytoma (s/p resection) + complex partial seizure d/o + tremor (parkinson's dz vs dementia with lewy bodies vs metabolic vs essential tremor vs medication tremor) + severe cervical and lumbar spinal stenosis  PLAN:  1. Continue divalproex ER 1571m daily for seizure control  2. Increase carb/levo to 1tab TID for tremor control 3. I spoke with patient daughter via phone (Public affairs consultant and all together we reviewed diagnosis, medications, prognosis and end-of-life planning. Patient has multiple medical and neurologic comorbidities, progressive overall decline, and increasing frequency of hospitalizations. Unfortunately he and his family have not discussed end-of-life planning. I took this opportunity to spend extensive time with patient and his family to discuss the importance of end-of-life planning, establishing his CODE STATUS, and understanding the differences and strategies for living a good quality of life rather than prolonging pain and suffering. I referred them to [email or web address] as a resource. I think patient and family would benefit from palliative care consultation as well.   Return in about 3 months (around 03/08/2015).   I spent 40 minutes of face to face time with patient and wife, and reviewed findings over the phone with patient's daughter. Greater than 50% of time was spent in counseling and coordination of care with patient.  VPenni Bombard MD 34/26/8341 49:62PM Certified in Neurology, Neurophysiology and Neuroimaging  [facility] [address] [place] [phone]

## 2014-12-07 ENCOUNTER — Encounter: Payer: Self-pay | Admitting: Diagnostic Neuroimaging

## 2014-12-07 ENCOUNTER — Encounter (HOSPITAL_BASED_OUTPATIENT_CLINIC_OR_DEPARTMENT_OTHER): Payer: Medicare Other | Attending: Internal Medicine

## 2014-12-07 DIAGNOSIS — Z794 Long term (current) use of insulin: Secondary | ICD-10-CM | POA: Diagnosis not present

## 2014-12-07 DIAGNOSIS — E1122 Type 2 diabetes mellitus with diabetic chronic kidney disease: Secondary | ICD-10-CM | POA: Diagnosis not present

## 2014-12-07 DIAGNOSIS — I509 Heart failure, unspecified: Secondary | ICD-10-CM | POA: Diagnosis not present

## 2014-12-07 DIAGNOSIS — N186 End stage renal disease: Secondary | ICD-10-CM | POA: Diagnosis not present

## 2014-12-07 DIAGNOSIS — K219 Gastro-esophageal reflux disease without esophagitis: Secondary | ICD-10-CM | POA: Insufficient documentation

## 2014-12-07 DIAGNOSIS — E785 Hyperlipidemia, unspecified: Secondary | ICD-10-CM | POA: Diagnosis not present

## 2014-12-07 DIAGNOSIS — L97411 Non-pressure chronic ulcer of right heel and midfoot limited to breakdown of skin: Secondary | ICD-10-CM | POA: Diagnosis not present

## 2014-12-07 DIAGNOSIS — Z89522 Acquired absence of left knee: Secondary | ICD-10-CM | POA: Diagnosis not present

## 2014-12-07 DIAGNOSIS — M109 Gout, unspecified: Secondary | ICD-10-CM | POA: Diagnosis not present

## 2014-12-07 DIAGNOSIS — E11621 Type 2 diabetes mellitus with foot ulcer: Secondary | ICD-10-CM | POA: Insufficient documentation

## 2014-12-07 DIAGNOSIS — I120 Hypertensive chronic kidney disease with stage 5 chronic kidney disease or end stage renal disease: Secondary | ICD-10-CM | POA: Diagnosis not present

## 2014-12-13 ENCOUNTER — Telehealth: Payer: Self-pay | Admitting: Diagnostic Neuroimaging

## 2014-12-13 NOTE — Telephone Encounter (Signed)
Patient's wife is calling because patient is still having pain in his head which seems to be worse. Patient's wife wants to know if an MRI can be ordered. Patient also has a knot above the incision where patient had surgery. Please call and advise. Thank you.

## 2014-12-15 ENCOUNTER — Encounter (HOSPITAL_BASED_OUTPATIENT_CLINIC_OR_DEPARTMENT_OTHER): Payer: Medicare Other | Attending: Internal Medicine

## 2014-12-15 DIAGNOSIS — I132 Hypertensive heart and chronic kidney disease with heart failure and with stage 5 chronic kidney disease, or end stage renal disease: Secondary | ICD-10-CM | POA: Insufficient documentation

## 2014-12-15 DIAGNOSIS — M109 Gout, unspecified: Secondary | ICD-10-CM | POA: Insufficient documentation

## 2014-12-15 DIAGNOSIS — L97411 Non-pressure chronic ulcer of right heel and midfoot limited to breakdown of skin: Secondary | ICD-10-CM | POA: Insufficient documentation

## 2014-12-15 DIAGNOSIS — E11621 Type 2 diabetes mellitus with foot ulcer: Secondary | ICD-10-CM | POA: Insufficient documentation

## 2014-12-15 DIAGNOSIS — G40909 Epilepsy, unspecified, not intractable, without status epilepticus: Secondary | ICD-10-CM | POA: Diagnosis not present

## 2014-12-15 DIAGNOSIS — N186 End stage renal disease: Secondary | ICD-10-CM | POA: Diagnosis not present

## 2014-12-15 DIAGNOSIS — I509 Heart failure, unspecified: Secondary | ICD-10-CM | POA: Diagnosis not present

## 2014-12-15 DIAGNOSIS — E1122 Type 2 diabetes mellitus with diabetic chronic kidney disease: Secondary | ICD-10-CM | POA: Insufficient documentation

## 2014-12-15 DIAGNOSIS — E875 Hyperkalemia: Secondary | ICD-10-CM | POA: Diagnosis not present

## 2014-12-15 DIAGNOSIS — K219 Gastro-esophageal reflux disease without esophagitis: Secondary | ICD-10-CM | POA: Diagnosis not present

## 2014-12-15 LAB — GLUCOSE, CAPILLARY: Glucose-Capillary: 283 mg/dL — ABNORMAL HIGH (ref 70–99)

## 2014-12-19 NOTE — Telephone Encounter (Signed)
Spoke to the pts wife on the phone, she explained that the surgery was old, "from many years ago" but that the lump above the area was new. She also said that he is having sharp headache pains and wanted to know if an MRI would be helpful.   I talked with Dr. Leta Baptist and he stated that he would defer to PCP since the lump sounded superficial and would like me to talk to the wife about the CT scan he had in November being normal.  Called back and spoke with the wife who told me that the headaches have been "going on for about a year now". Asked if the facility was giving him medications like tylenol and ibuprofen and she said yes. She stated they give it to him everyday and they keep coming back. I told her she could always come back in to see Dr. Leta Baptist before his appt in May if the headaches got worse. I also told her that as far as the lump on the scalp was concerned, that Dr. Leta Baptist wanted the pt to follow up with a PCP. She stated an understanding and a thanks

## 2014-12-21 ENCOUNTER — Emergency Department (HOSPITAL_COMMUNITY): Payer: Medicare Other

## 2014-12-21 ENCOUNTER — Encounter (HOSPITAL_COMMUNITY): Payer: Self-pay

## 2014-12-21 ENCOUNTER — Inpatient Hospital Stay (HOSPITAL_COMMUNITY)
Admission: EM | Admit: 2014-12-21 | Discharge: 2014-12-25 | DRG: 871 | Disposition: A | Discharge status: Hospice | Payer: Medicare Other | Attending: Internal Medicine | Admitting: Internal Medicine

## 2014-12-21 DIAGNOSIS — I120 Hypertensive chronic kidney disease with stage 5 chronic kidney disease or end stage renal disease: Secondary | ICD-10-CM | POA: Diagnosis present

## 2014-12-21 DIAGNOSIS — G40909 Epilepsy, unspecified, not intractable, without status epilepticus: Secondary | ICD-10-CM | POA: Diagnosis present

## 2014-12-21 DIAGNOSIS — Z89512 Acquired absence of left leg below knee: Secondary | ICD-10-CM

## 2014-12-21 DIAGNOSIS — G9340 Encephalopathy, unspecified: Secondary | ICD-10-CM | POA: Diagnosis present

## 2014-12-21 DIAGNOSIS — N390 Urinary tract infection, site not specified: Secondary | ICD-10-CM | POA: Diagnosis present

## 2014-12-21 DIAGNOSIS — B964 Proteus (mirabilis) (morganii) as the cause of diseases classified elsewhere: Secondary | ICD-10-CM | POA: Diagnosis present

## 2014-12-21 DIAGNOSIS — I480 Paroxysmal atrial fibrillation: Secondary | ICD-10-CM | POA: Diagnosis present

## 2014-12-21 DIAGNOSIS — G20 Parkinson's disease: Secondary | ICD-10-CM | POA: Diagnosis present

## 2014-12-21 DIAGNOSIS — N189 Chronic kidney disease, unspecified: Secondary | ICD-10-CM

## 2014-12-21 DIAGNOSIS — Z992 Dependence on renal dialysis: Secondary | ICD-10-CM | POA: Diagnosis not present

## 2014-12-21 DIAGNOSIS — L97419 Non-pressure chronic ulcer of right heel and midfoot with unspecified severity: Secondary | ICD-10-CM | POA: Diagnosis present

## 2014-12-21 DIAGNOSIS — E785 Hyperlipidemia, unspecified: Secondary | ICD-10-CM | POA: Diagnosis present

## 2014-12-21 DIAGNOSIS — L8990 Pressure ulcer of unspecified site, unspecified stage: Secondary | ICD-10-CM | POA: Diagnosis present

## 2014-12-21 DIAGNOSIS — I998 Other disorder of circulatory system: Secondary | ICD-10-CM | POA: Diagnosis not present

## 2014-12-21 DIAGNOSIS — K219 Gastro-esophageal reflux disease without esophagitis: Secondary | ICD-10-CM | POA: Diagnosis present

## 2014-12-21 DIAGNOSIS — Z993 Dependence on wheelchair: Secondary | ICD-10-CM | POA: Diagnosis not present

## 2014-12-21 DIAGNOSIS — L89152 Pressure ulcer of sacral region, stage 2: Secondary | ICD-10-CM | POA: Diagnosis present

## 2014-12-21 DIAGNOSIS — R059 Cough, unspecified: Secondary | ICD-10-CM

## 2014-12-21 DIAGNOSIS — I70261 Atherosclerosis of native arteries of extremities with gangrene, right leg: Secondary | ICD-10-CM | POA: Diagnosis present

## 2014-12-21 DIAGNOSIS — R4182 Altered mental status, unspecified: Secondary | ICD-10-CM | POA: Diagnosis present

## 2014-12-21 DIAGNOSIS — G20A1 Parkinson's disease without dyskinesia, without mention of fluctuations: Secondary | ICD-10-CM | POA: Diagnosis present

## 2014-12-21 DIAGNOSIS — A419 Sepsis, unspecified organism: Secondary | ICD-10-CM | POA: Diagnosis present

## 2014-12-21 DIAGNOSIS — Z7982 Long term (current) use of aspirin: Secondary | ICD-10-CM | POA: Diagnosis not present

## 2014-12-21 DIAGNOSIS — R05 Cough: Secondary | ICD-10-CM

## 2014-12-21 DIAGNOSIS — D631 Anemia in chronic kidney disease: Secondary | ICD-10-CM | POA: Diagnosis present

## 2014-12-21 DIAGNOSIS — L03115 Cellulitis of right lower limb: Secondary | ICD-10-CM | POA: Diagnosis present

## 2014-12-21 DIAGNOSIS — IMO0002 Reserved for concepts with insufficient information to code with codable children: Secondary | ICD-10-CM | POA: Diagnosis present

## 2014-12-21 DIAGNOSIS — E871 Hypo-osmolality and hyponatremia: Secondary | ICD-10-CM | POA: Diagnosis present

## 2014-12-21 DIAGNOSIS — N186 End stage renal disease: Secondary | ICD-10-CM | POA: Diagnosis present

## 2014-12-21 DIAGNOSIS — L89610 Pressure ulcer of right heel, unstageable: Secondary | ICD-10-CM | POA: Diagnosis present

## 2014-12-21 DIAGNOSIS — E872 Acidosis: Secondary | ICD-10-CM | POA: Diagnosis present

## 2014-12-21 DIAGNOSIS — Z66 Do not resuscitate: Secondary | ICD-10-CM | POA: Diagnosis present

## 2014-12-21 DIAGNOSIS — Z515 Encounter for palliative care: Secondary | ICD-10-CM

## 2014-12-21 DIAGNOSIS — E1165 Type 2 diabetes mellitus with hyperglycemia: Secondary | ICD-10-CM | POA: Diagnosis present

## 2014-12-21 DIAGNOSIS — N2581 Secondary hyperparathyroidism of renal origin: Secondary | ICD-10-CM | POA: Diagnosis present

## 2014-12-21 LAB — BASIC METABOLIC PANEL
Anion gap: 16 — ABNORMAL HIGH (ref 5–15)
BUN: 41 mg/dL — AB (ref 6–23)
CO2: 26 mmol/L (ref 19–32)
CREATININE: 8.59 mg/dL — AB (ref 0.50–1.35)
Calcium: 9.1 mg/dL (ref 8.4–10.5)
Chloride: 92 mmol/L — ABNORMAL LOW (ref 96–112)
GFR calc Af Amer: 6 mL/min — ABNORMAL LOW (ref >90)
GFR, EST NON AFRICAN AMERICAN: 5 mL/min — AB (ref >90)
GLUCOSE: 334 mg/dL — AB (ref 70–99)
POTASSIUM: 3.9 mmol/L (ref 3.5–5.1)
Sodium: 134 mmol/L — ABNORMAL LOW (ref 135–145)

## 2014-12-21 LAB — I-STAT TROPONIN, ED: Troponin i, poc: 0.02 ng/mL (ref 0.00–0.08)

## 2014-12-21 LAB — URINE MICROSCOPIC-ADD ON

## 2014-12-21 LAB — CBG MONITORING, ED
GLUCOSE-CAPILLARY: 302 mg/dL — AB (ref 70–99)
Glucose-Capillary: 294 mg/dL — ABNORMAL HIGH (ref 70–99)

## 2014-12-21 LAB — CBC
HEMATOCRIT: 34 % — AB (ref 39.0–52.0)
Hemoglobin: 11.2 g/dL — ABNORMAL LOW (ref 13.0–17.0)
MCH: 33.7 pg (ref 26.0–34.0)
MCHC: 32.9 g/dL (ref 30.0–36.0)
MCV: 102.4 fL — ABNORMAL HIGH (ref 78.0–100.0)
Platelets: 354 10*3/uL (ref 150–400)
RBC: 3.32 MIL/uL — ABNORMAL LOW (ref 4.22–5.81)
RDW: 14.7 % (ref 11.5–15.5)
WBC: 15.9 10*3/uL — ABNORMAL HIGH (ref 4.0–10.5)

## 2014-12-21 LAB — URINALYSIS, ROUTINE W REFLEX MICROSCOPIC
Glucose, UA: NEGATIVE mg/dL
Ketones, ur: 15 mg/dL — AB
NITRITE: NEGATIVE
PH: 7 (ref 5.0–8.0)
Protein, ur: >300 mg/dL — AB
SPECIFIC GRAVITY, URINE: 1.022 (ref 1.005–1.030)
Urobilinogen, UA: 1 mg/dL (ref 0.0–1.0)

## 2014-12-21 LAB — I-STAT CG4 LACTIC ACID, ED: Lactic Acid, Venous: 2.21 mmol/L (ref 0.5–2.0)

## 2014-12-21 MED ORDER — FENTANYL CITRATE 0.05 MG/ML IJ SOLN
25.0000 ug | Freq: Once | INTRAMUSCULAR | Status: AC
  Administered 2014-12-21: 25 ug via INTRAVENOUS
  Filled 2014-12-21: qty 2

## 2014-12-21 MED ORDER — SODIUM CHLORIDE 0.9 % IV BOLUS (SEPSIS)
1000.0000 mL | Freq: Once | INTRAVENOUS | Status: AC
  Administered 2014-12-21: 1000 mL via INTRAVENOUS

## 2014-12-21 MED ORDER — DEXTROSE 5 % IV SOLN
1.0000 g | Freq: Once | INTRAVENOUS | Status: AC
  Administered 2014-12-21: 1 g via INTRAVENOUS
  Filled 2014-12-21: qty 10

## 2014-12-21 MED ORDER — ACETAMINOPHEN 325 MG PO TABS
650.0000 mg | ORAL_TABLET | Freq: Once | ORAL | Status: AC
  Administered 2014-12-21: 650 mg via ORAL
  Filled 2014-12-21: qty 2

## 2014-12-21 NOTE — ED Notes (Signed)
Pt brought in by EMS from dialysis.  Pt lives at [facility].  Pt has felt "sick" for 3 months with increased weakness x1 week.  Pt was at dialysis and had aprox. 30 min of dialysis before staff reports he became lethargic and hypotensive.  Fistula is still accessed upon arrival.  Pt was at baseline with EMS with vital signs WNL.

## 2014-12-21 NOTE — ED Notes (Signed)
Critical lab results of CG4 reported to [facility].

## 2014-12-21 NOTE — ED Notes (Signed)
Right heel unwrapped and ABD pad applied.

## 2014-12-21 NOTE — ED Notes (Signed)
Pt fistula/graft site is WNL.  No bleeding noted.

## 2014-12-21 NOTE — ED Provider Notes (Signed)
Patient's care was taken over at 1700 from [place], [place]. Please see her note for full HPI.   Pt is a 75 yo M with hx of ESRD on HD, Afib, DM, CHF, PVD, remote brain tumor resection with baseline mild left sided weakness, left BKA, who presents with AMS and hypotensive.  Lives at SNF.  At the end of dialysis, pt became hypotensive so HD was stopped and he was sent to the ED.  Wife thinks he has been confused for the past few weeks, progressively so in the past few days.  She reports he has a hx of similar presentation with UTI in the past.    Normotensive and afebrile here.  Known sacral ulcers, with clean dressings.  Urine looked brown and thick, so given rocephin.   Labs, CXR, and UA pending.  Will add CT head.  Plan to follow up labs and CT head.     Foley cathed UA pan positive.  Likely due to oliguria from ESRD and stasis.  + ketones, protein, leuks, WBC, and many bacteria.  Was already covered with Rocephin.   BMP consistent with ESRD, at baseline.  Mild hyponatremia and hypochloremia, but similar to previous readings.  Glucose 334, AG 16.  Trop benign.  Lactate 2.2  Remained normotensive and unchanged mental status from presentation.  Given NS bolus due to lactic acidosis.  VBG ordered to evaluate hyperglycemia.    Re-evaluated.  Now febrile to 100.8.  Given tylenol. Found to have a draining right foot ulcer with surrounding cellulitis.  Wife reports it smells much worse than it has in the past and seems to be more red than is his baseline.  Will need to rule out osteo.  Right foot xrays ordered.   Bilateral stage 1-2 sacral ulcers.  Do not appear grossly infected.  Nontender to palpation.  No discharge.    Xray of right foot with soft tissue changes but no sign of bony changes.  Will need admission for lactic acidosis and right foot infection.   Hospitalist consulted at 2110.    To admit to hospitalists team, Dr  Hal Hope.  Stable for the floor.      Patient was seen with ED  Attending, Dr. Alfonse Spruce, MD   Tori Milks, MD 12/22/14 [id number]  Veryl Speak, MD 12/23/14 [phone]

## 2014-12-21 NOTE — ED Notes (Signed)
  CBG 302

## 2014-12-21 NOTE — ED Notes (Signed)
Direct pressure removed from fistula/graft site.  No bleeding noted.  Dressing applied.  Pt in NAD

## 2014-12-21 NOTE — ED Notes (Signed)
Transporting patient to new room location.

## 2014-12-21 NOTE — ED Notes (Signed)
PA made aware of rectal temp

## 2014-12-21 NOTE — ED Provider Notes (Addendum)
CSN: [id number]     Arrival date & time 12/21/14  1318 History   First MD Initiated Contact with Patient 12/21/14 1343     Chief Complaint  Patient presents with  . Hypotension     (Consider location/radiation/quality/duration/timing/severity/associated sxs/prior Treatment) HPI Comments: Patient with a history of ESRD on hemodialysis, Peripheral Vascular Disease, Paroxysmal Atrial Fibrillation, DM, CHF, presents today from dialysis.  Apparently after being dialyzed for 30 minutes he became lethargic, which prompted them to bring him in.  No LOC or seizure activity.  EMS also reported that the patient was hypotensive, however, there was no blood pressure documented by dialysis staff. Blood pressure 107/57 upon arrival in the ED.  Patient currently lives at [facility].  He reports generalized weakness for the past week.  Denies any focal weakness.  He does have some baseline left weakness that has been present since having a brain tumor removed.  He also reports that he has had an intermittent cough for the past week.  Denies SOB, fever, chills, chest pain, nausea, vomiting, or abdominal pain.  He does have a chronic wound of his right foot and also a sacral pressure ulcer.  Both are being followed by Wound Management.  Wound Management evaluated and changed dressings on both wounds earlier today.    The history is provided by the patient.    Past Medical History  Diagnosis Date  . ESRD on hemodialysis     Started HD around 2009, maybe longer.  Was living in [place], [place] then.  Moved to [place] in 2014 and gets HD now at [facility] on [address] on a TTS schedule.  ESRD was due to DM and HTN.     Marland Kitchen Peripheral vascular disease   . Hypertension   . Hyperlipidemia   . Arthritis     Gout  . Paroxysmal atrial fibrillation   . Brain tumor 2009  . Seizure disorder 2009  . Anemia   . Hyperparathyroidism, secondary   . Diabetes mellitus type 2 with complications     Type II,  diet controlled  . Proteus mirabilis infection   . CHF (congestive heart failure)     grade 1 DD, preserved EF, [facility] records  . 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 brain tumor  . GERD (gastroesophageal reflux disease)   . Hx of cardiac cath     a. LHC (9/14):  Normal cors, EF 65%.    Past Surgical History  Procedure Laterality Date  . Av fistula placement Left     arm  . 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 Eye surgery Bilateral 2000    cataract  . Retinopathy surgery      Hx. of laser treatments for diabetics  . Craniectomy / craniotomy for excision of brain tumor  2009  . Fistulogram Left 05/28/2014    Procedure: FISTULOGRAM (ARM);  Surgeon: Conrad Davenport, MD;  Location: [facility];  Service: Vascular;  Laterality: Left;  Angioplasty of left sephalic vein times two.  . Revision of arteriovenous goretex graft Left 05/28/2014    Procedure: POSSIBLE REVISION OF FISTULA;  Surgeon: Conrad Cuartelez, MD;  Location: [facility];  Service: Vascular;  Laterality: Left;  Revision of Left arm fistula.  Darden Dates without cardioversion N/A 05/29/2014    Procedure: TRANSESOPHAGEAL ECHOCARDIOGRAM (TEE);  Surgeon: Fay Records, MD;  Location: [facility]  ENDOSCOPY;  Service: Cardiovascular;  Laterality: N/A;  . Radiology with anesthesia Left 05/31/2014    Procedure: MRI WITH ANESTHESIA, Left hip;  Surgeon: Medication Radiologist, MD;  Location: [facility];  Service: Radiology;  Laterality: Left;  Dr Jacinta Shoe ordered  . Left heart catheterization with coronary angiogram Bilateral 05/27/2013    Procedure: LEFT HEART CATHETERIZATION WITH CORONARY ANGIOGRAM;  Surgeon: Burnell Blanks, MD;  Location: [facility] CATH LAB;  Service: Cardiovascular;  Laterality: Bilateral;  . Shuntogram Left 12/04/2014    Procedure: FISTULOGRAM;  Surgeon: Conrad Sunland Park, MD;  Location: [facility] CATH LAB;  Service: Cardiovascular;  Laterality: Left;   Family  History  Problem Relation Age of Onset  . Seizures Mother   . Stroke Mother 40  . Hypertension Mother   . Alcohol abuse Father   . Hypertension Father   . Arthritis Sister   . Heart attack Neg Hx   . Heart failure Neg Hx    History  Substance Use Topics  . Smoking status: Never Smoker   . Smokeless tobacco: Never Used  . Alcohol Use: No    Review of Systems  All other systems reviewed and are negative.     Allergies  Review of patient's allergies indicates no known allergies.  Home Medications   Prior to Admission medications   Medication Sig Start Date End Date Taking? Authorizing Provider  aspirin EC 325 MG tablet Take 325 mg by mouth daily.    Historical Provider, MD  atorvastatin (LIPITOR) 20 MG tablet Take 20 mg by mouth at bedtime.     Historical Provider, MD  B Complex-C-Folic Acid (NEPHROCAPS PO) Take 1 capsule by mouth daily at 6 PM.    Historical Provider, MD  carbidopa-levodopa (SINEMET IR) 25-100 MG per tablet Take 1 tablet by mouth 3 (three) times daily. Give 30 minutes before meals 12/06/14   Penni Bombard, MD  Cholecalciferol (VITAMIN D3) 1000 UNITS CAPS Take 1,000 Units by mouth daily.     Historical Provider, MD  cinacalcet (SENSIPAR) 30 MG tablet Take 1 tablet (30 mg total) by mouth daily after supper. 06/01/14   Kinnie Feil, MD  darbepoetin (ARANESP) 60 MCG/0.3ML SOLN injection Inject 0.3 mLs (60 mcg total) into the vein every Thursday with hemodialysis. 06/01/14   Kinnie Feil, MD  divalproex (DEPAKOTE ER) 500 MG 24 hr tablet Take 3 tablets (1,500 mg total) by mouth daily. 12/06/14   Penni Bombard, MD  escitalopram (LEXAPRO) 5 MG tablet Take 1 tablet (5 mg total) by mouth daily. 06/01/14   Kinnie Feil, MD  gabapentin (NEURONTIN) 300 MG capsule Take 1 capsule (300 mg total) by mouth at bedtime. 06/01/14   Kinnie Feil, MD  hydrocortisone (ANUSOL-HC) 25 MG suppository Place 25 mg rectally daily as needed for hemorrhoids or itching.     Historical Provider, MD  Menthol, Topical Analgesic, 4 % GEL Apply 1 application topically 4 (four) times daily as needed (neck pain).    Historical Provider, MD  metoprolol succinate (TOPROL-XL) 25 MG 24 hr tablet Take 12.5 mg by mouth at bedtime.    Historical Provider, MD  Multiple Vitamins-Minerals (DECUBI-VITE) CAPS Take 1 capsule by mouth daily.    Historical Provider, MD  nitroGLYCERIN (NITROSTAT) 0.4 MG SL tablet Place 1 tablet (0.4 mg total) under the tongue every 5 (five) minutes x 3 doses as needed for chest pain. Patient not taking: Reported on 12/06/2014 05/28/13   Nita Sells, MD  oxyCODONE-acetaminophen (PERCOCET/ROXICET) 5-325 MG per tablet Take  1 tablet by mouth every 6 (six) hours as needed for severe pain. 06/01/14   Kinnie Feil, MD  simethicone (MYLICON) 80 MG chewable tablet Chew 80 mg by mouth 2 (two) times daily as needed for flatulence.    Historical Provider, MD  sucroferric oxyhydroxide (VELPHORO) 500 MG chewable tablet Chew 1,500 mg by mouth 3 (three) times daily before meals.    Historical Provider, MD   BP 115/59 mmHg  Pulse 84  Temp(Src) 98.1 F (36.7 C) (Oral)  Resp 8  Ht 5\' 10"  (1.778 m)  Wt 265 lb (120.203 kg)  BMI 38.02 kg/m2  SpO2 97% Physical Exam  Constitutional: He appears well-developed and well-nourished. No distress.  HENT:  Head: Normocephalic and atraumatic.  Mouth/Throat: Oropharynx is clear and moist.  Eyes: EOM are normal. Pupils are equal, round, and reactive to light.  Neck: Normal range of motion. Neck supple.  Cardiovascular: Normal rate, regular rhythm and normal heart sounds.   Pulmonary/Chest: Effort normal and breath sounds normal.  Abdominal: Soft. Bowel sounds are normal. He exhibits no distension and no mass. There is no tenderness. There is no rebound and no guarding.  Musculoskeletal:  Left BKA  Neurological: He is alert. No cranial nerve deficit.  Grip strength 5/5 RUE and 4/5 LUE Sensation of UE intact  bilaterally   Skin: Skin is warm and dry.  Chronic necrotic wound of the right heel and also a sacral pressure ulcer.    Psychiatric: He has a normal mood and affect.  Nursing note and vitals reviewed.   ED Course  Procedures (including critical care time) Labs Review Labs Reviewed  CBC - Abnormal; Notable for the following:    WBC 15.9 (*)    RBC 3.32 (*)    Hemoglobin 11.2 (*)    HCT 34.0 (*)    MCV 102.4 (*)    All other components within normal limits  BASIC METABOLIC PANEL - Abnormal; Notable for the following:    Sodium 134 (*)    Chloride 92 (*)    Glucose, Bld 334 (*)    BUN 41 (*)    Creatinine, Ser 8.59 (*)    GFR calc non Af Amer 5 (*)    GFR calc Af Amer 6 (*)    Anion gap 16 (*)    All other components within normal limits  CBG MONITORING, ED - Abnormal; Notable for the following:    Glucose-Capillary 294 (*)    All other components within normal limits  I-STAT CG4 LACTIC ACID, ED - Abnormal; Notable for the following:    Lactic Acid, Venous 2.21 (*)    All other components within normal limits  I-STAT TROPOININ, ED    Imaging Review No results found.   EKG Interpretation   Date/Time:  Thursday December 21 2014 13:26:35 EDT Ventricular Rate:  85 PR Interval:  171 QRS Duration: 88 QT Interval:  400 QTC Calculation: 476 R Axis:   18 Text Interpretation:  Sinus rhythm Borderline prolonged QT interval  Confirmed by Karinna Beadles  MD, Arlo Buffone ([id number]) on 12/21/2014 1:37:18 PM      3:00 PM Lactate slightly elevated at 2.2.  Dr Rogene Houston informed.  Patient not given fluid bolus due to the fact that he is ESRD on dialysis and is normotensive at this time.  4:15 PM Patient's wife now arrived in the ED.  She reports that she feels that the patient is more confused than usual at this time.  She reports that he had a similar  presentation a couple of months ago when he had a UTI.   5:00 PM Patient signed out at shift change.  UA and CXR pending.   MDM   Final  diagnoses:  Cough  Patient presents today from dialysis center after he had a period which they report he was less responsive.  No LOC or seizure activity observed.  It was also reported that he was hypotensive.  However, he was normotensive in the ED.  No tachycardia.  Afebrile.  He was found to have an elevated WBC.  CXR pending at shift change.  He does report that he has been coughing over the past week.  Abdomen is soft and nontender.  Wife reports that he had similar symptoms a few months ago with a UTI.  Patient makes very little urine.  UA pending.  Patient also with chronic pressure ulcer wounds to the sacrum and right heel that are followed by Wound Management, which could also be potential source of infection.      Hyman Bible, PA-C 12/23/14 2113  Medical screening examination/treatment/procedure(s) were conducted as a shared visit with non-physician practitioner(s) and myself.  I personally evaluated the patient during the encounter.   EKG Interpretation   Date/Time:  Thursday December 21 2014 13:26:35 EDT Ventricular Rate:  85 PR Interval:  171 QRS Duration: 88 QT Interval:  400 QTC Calculation: 476 R Axis:   18 Text Interpretation:  Sinus rhythm Borderline prolonged QT interval  Confirmed by Margie Brink  MD, Zarion Oliff [phone]) on 12/21/2014 1:37:18 PM      Results for orders placed or performed during the hospital encounter of 12/21/14  Blood culture (routine x 2)  Result Value Ref Range   Specimen Description BLOOD RIGHT ANTECUBITAL    Special Requests BOTTLES DRAWN AEROBIC ONLY 2CC    Culture             BLOOD CULTURE RECEIVED NO GROWTH TO DATE CULTURE WILL BE HELD FOR 5 DAYS BEFORE ISSUING A FINAL NEGATIVE REPORT Performed at [facility]    Report Status PENDING   Blood culture (routine x 2)  Result Value Ref Range   Specimen Description BLOOD RIGHT HAND    Special Requests BOTTLES DRAWN AEROBIC AND ANAEROBIC 5CC    Culture             BLOOD CULTURE RECEIVED  NO GROWTH TO DATE CULTURE WILL BE HELD FOR 5 DAYS BEFORE ISSUING A FINAL NEGATIVE REPORT Performed at [facility]    Report Status PENDING   Urine culture  Result Value Ref Range   Specimen Description URINE, CATHETERIZED    Special Requests Immunocompromised    Colony Count      >=100,000 COLONIES/ML Performed at [place] Performed at [facility]    Report Status 12/23/2014 FINAL    Organism ID, Bacteria PROTEUS MIRABILIS       Susceptibility   Proteus mirabilis - MIC*    AMPICILLIN <=2 SENSITIVE Sensitive     CEFAZOLIN 8 SENSITIVE Sensitive     CEFTRIAXONE <=1 SENSITIVE Sensitive     CIPROFLOXACIN 1 SENSITIVE Sensitive     GENTAMICIN <=1 SENSITIVE Sensitive     LEVOFLOXACIN 1 SENSITIVE Sensitive     NITROFURANTOIN 128 RESISTANT Resistant     TOBRAMYCIN <=1 SENSITIVE Sensitive     TRIMETH/SULFA <=20 SENSITIVE Sensitive     PIP/TAZO <=4 SENSITIVE Sensitive     * PROTEUS MIRABILIS  MRSA PCR Screening  Result Value Ref Range   MRSA by PCR NEGATIVE NEGATIVE  CBC  (at [facility])  Result Value Ref Range   WBC 15.9 (H) 4.0 - 10.5 K/uL   RBC 3.32 (L) 4.22 - 5.81 MIL/uL   Hemoglobin 11.2 (L) 13.0 - 17.0 g/dL   HCT 34.0 (L) 39.0 - 52.0 %   MCV 102.4 (H) 78.0 - 100.0 fL   MCH 33.7 26.0 - 34.0 pg   MCHC 32.9 30.0 - 36.0 g/dL   RDW 14.7 11.5 - 15.5 %   Platelets 354 150 - 400 K/uL  Basic metabolic panel  (at [facility])  Result Value Ref Range   Sodium 134 (L) 135 - 145 mmol/L   Potassium 3.9 3.5 - 5.1 mmol/L   Chloride 92 (L) 96 - 112 mmol/L   CO2 26 19 - 32 mmol/L   Glucose, Bld 334 (H) 70 - 99 mg/dL   BUN 41 (H) 6 - 23 mg/dL   Creatinine, Ser 8.59 (H) 0.50 - 1.35 mg/dL   Calcium 9.1 8.4 - 10.5 mg/dL   GFR calc non Af Amer 5 (L) >90 mL/min   GFR calc Af Amer 6 (L) >90 mL/min   Anion gap 16 (H) 5 - 15  Urinalysis, Routine w reflex microscopic  Result Value Ref Range   Color, Urine BROWN  (A) YELLOW   APPearance TURBID (A) CLEAR   Specific Gravity, Urine 1.022 1.005 - 1.030   pH 7.0 5.0 - 8.0   Glucose, UA NEGATIVE NEGATIVE mg/dL   Hgb urine dipstick LARGE (A) NEGATIVE   Bilirubin Urine SMALL (A) NEGATIVE   Ketones, ur 15 (A) NEGATIVE mg/dL   Protein, ur >300 (A) NEGATIVE mg/dL   Urobilinogen, UA 1.0 0.0 - 1.0 mg/dL   Nitrite NEGATIVE NEGATIVE   Leukocytes, UA LARGE (A) NEGATIVE  Urine microscopic-add on  Result Value Ref Range   Squamous Epithelial / LPF FEW (A) RARE   WBC, UA TOO NUMEROUS TO COUNT <3 WBC/hpf   RBC / HPF TOO NUMEROUS TO COUNT <3 RBC/hpf   Bacteria, UA MANY (A) RARE   Casts GRANULAR CAST (A) NEGATIVE  Blood gas, arterial  Result Value Ref Range   pH, Arterial 7.421 7.350 - 7.450   pCO2 arterial 41.3 35.0 - 45.0 mmHg   pO2, Arterial 68.8 (L) 80.0 - 100.0 mmHg   Bicarbonate 26.3 (H) 20.0 - 24.0 mEq/L   TCO2 27.6 0 - 100 mmol/L   Acid-Base Excess 2.2 (H) 0.0 - 2.0 mmol/L   O2 Saturation 95.0 %   Patient temperature 98.6    Collection site RIGHT RADIAL    Drawn by [id number]    Sample type ARTERIAL DRAW    Allens test (pass/fail) PASS PASS  Basic metabolic panel  Result Value Ref Range   Sodium 137 135 - 145 mmol/L   Potassium 4.0 3.5 - 5.1 mmol/L   Chloride 98 96 - 112 mmol/L   CO2 25 19 - 32 mmol/L   Glucose, Bld 260 (H) 70 - 99 mg/dL   BUN 48 (H) 6 - 23 mg/dL   Creatinine, Ser 9.79 (H) 0.50 - 1.35 mg/dL   Calcium 9.1 8.4 - 10.5 mg/dL   GFR calc non Af Amer 5 (L) >90 mL/min   GFR calc Af Amer 5 (L) >90 mL/min   Anion gap 14 5 - 15  CBC with Differential/Platelet  Result Value Ref Range   WBC 14.0 (H) 4.0 - 10.5  K/uL   RBC 3.07 (L) 4.22 - 5.81 MIL/uL   Hemoglobin 10.2 (L) 13.0 - 17.0 g/dL   HCT 31.5 (L) 39.0 - 52.0 %   MCV 102.6 (H) 78.0 - 100.0 fL   MCH 33.2 26.0 - 34.0 pg   MCHC 32.4 30.0 - 36.0 g/dL   RDW 14.7 11.5 - 15.5 %   Platelets 346 150 - 400 K/uL   Neutrophils Relative % 69 43 - 77 %   Neutro Abs 9.7 (H) 1.7 - 7.7 K/uL    Lymphocytes Relative 14 12 - 46 %   Lymphs Abs 2.0 0.7 - 4.0 K/uL   Monocytes Relative 16 (H) 3 - 12 %   Monocytes Absolute 2.2 (H) 0.1 - 1.0 K/uL   Eosinophils Relative 1 0 - 5 %   Eosinophils Absolute 0.1 0.0 - 0.7 K/uL   Basophils Relative 0 0 - 1 %   Basophils Absolute 0.0 0.0 - 0.1 K/uL  Influenza panel by PCR (type A & B, H1N1)  Result Value Ref Range   Influenza A By PCR NEGATIVE NEGATIVE   Influenza B By PCR NEGATIVE NEGATIVE   H1N1 flu by pcr NOT DETECTED NOT DETECTED  Hemoglobin A1c  Result Value Ref Range   Hgb A1c MFr Bld 10.1 (H) 4.8 - 5.6 %   Mean Plasma Glucose 243 mg/dL  Lipid panel  Result Value Ref Range   Cholesterol 108 0 - 200 mg/dL   Triglycerides 118 <150 mg/dL   HDL 39 (L) >39 mg/dL   Total CHOL/HDL Ratio 2.8 RATIO   VLDL 24 0 - 40 mg/dL   LDL Cholesterol 45 0 - 99 mg/dL  Glucose, capillary  Result Value Ref Range   Glucose-Capillary 280 (H) 70 - 99 mg/dL  CBC  Result Value Ref Range   WBC 13.3 (H) 4.0 - 10.5 K/uL   RBC 2.77 (L) 4.22 - 5.81 MIL/uL   Hemoglobin 9.2 (L) 13.0 - 17.0 g/dL   HCT 28.5 (L) 39.0 - 52.0 %   MCV 102.9 (H) 78.0 - 100.0 fL   MCH 33.2 26.0 - 34.0 pg   MCHC 32.3 30.0 - 36.0 g/dL   RDW 14.7 11.5 - 15.5 %   Platelets 336 150 - 400 K/uL  Renal function panel  Result Value Ref Range   Sodium 135 135 - 145 mmol/L   Potassium 4.0 3.5 - 5.1 mmol/L   Chloride 97 96 - 112 mmol/L   CO2 27 19 - 32 mmol/L   Glucose, Bld 249 (H) 70 - 99 mg/dL   BUN 55 (H) 6 - 23 mg/dL   Creatinine, Ser 10.68 (H) 0.50 - 1.35 mg/dL   Calcium 8.8 8.4 - 10.5 mg/dL   Phosphorus 4.5 2.3 - 4.6 mg/dL   Albumin 1.9 (L) 3.5 - 5.2 g/dL   GFR calc non Af Amer 4 (L) >90 mL/min   GFR calc Af Amer 5 (L) >90 mL/min   Anion gap 11 5 - 15  Valproic acid level  Result Value Ref Range   Valproic Acid Lvl 41.3 (L) 50.0 - 100.0 ug/mL  Glucose, capillary  Result Value Ref Range   Glucose-Capillary 219 (H) 70 - 99 mg/dL  Glucose, capillary  Result Value Ref Range    Glucose-Capillary 228 (H) 70 - 99 mg/dL  Glucose, capillary  Result Value Ref Range   Glucose-Capillary 235 (H) 70 - 99 mg/dL  Glucose, capillary  Result Value Ref Range   Glucose-Capillary 122 (H) 70 - 99 mg/dL  Glucose, capillary  Result Value Ref Range   Glucose-Capillary 145 (H) 70 - 99 mg/dL  Glucose, capillary  Result Value Ref Range   Glucose-Capillary 167 (H) 70 - 99 mg/dL  Glucose, capillary  Result Value Ref Range   Glucose-Capillary 253 (H) 70 - 99 mg/dL  Glucose, capillary  Result Value Ref Range   Glucose-Capillary 214 (H) 70 - 99 mg/dL  Glucose, capillary  Result Value Ref Range   Glucose-Capillary 165 (H) 70 - 99 mg/dL  Glucose, capillary  Result Value Ref Range   Glucose-Capillary 229 (H) 70 - 99 mg/dL  CBG, ED  Result Value Ref Range   Glucose-Capillary 294 (H) 70 - 99 mg/dL  I-Stat Troponin, ED (not at [facility])  Result Value Ref Range   Troponin i, poc 0.02 0.00 - 0.08 ng/mL   Comment 3          I-Stat CG4 Lactic Acid, ED  Result Value Ref Range   Lactic Acid, Venous 2.21 (HH) 0.5 - 2.0 mmol/L   Comment NOTIFIED PHYSICIAN   CBG monitoring, ED  Result Value Ref Range   Glucose-Capillary 302 (H) 70 - 99 mg/dL   Comment 1 Notify RN    Comment 2 Document in Chart     Patient seen by me. Patient presented from dialysis Center of being less responsive. No loss of consciousness or seizure activity observed. Patient does wife stated similar symptoms in the past related to UTI. Time of our end of our shift, patient was turned over with UA and chest x-ray pending. Patient neurologically was alert no acute focal deficits. Lab-wise patient's potassium on normal value. Patient's lactic acid was elevated 2.2 but this is not unusual for dialysis patients. Patient did have any fever at the time that we were seeing them.  Fredia Sorrow, MD 12/25/14 1653  Fredia Sorrow, MD 12/25/14 1655

## 2014-12-22 ENCOUNTER — Encounter (HOSPITAL_COMMUNITY): Payer: Self-pay | Admitting: Internal Medicine

## 2014-12-22 ENCOUNTER — Inpatient Hospital Stay (HOSPITAL_COMMUNITY): Payer: Medicare Other

## 2014-12-22 DIAGNOSIS — L03115 Cellulitis of right lower limb: Secondary | ICD-10-CM

## 2014-12-22 DIAGNOSIS — IMO0002 Reserved for concepts with insufficient information to code with codable children: Secondary | ICD-10-CM | POA: Diagnosis present

## 2014-12-22 DIAGNOSIS — E1165 Type 2 diabetes mellitus with hyperglycemia: Secondary | ICD-10-CM

## 2014-12-22 DIAGNOSIS — A419 Sepsis, unspecified organism: Principal | ICD-10-CM

## 2014-12-22 DIAGNOSIS — L8990 Pressure ulcer of unspecified site, unspecified stage: Secondary | ICD-10-CM | POA: Diagnosis present

## 2014-12-22 DIAGNOSIS — I998 Other disorder of circulatory system: Secondary | ICD-10-CM

## 2014-12-22 DIAGNOSIS — G9340 Encephalopathy, unspecified: Secondary | ICD-10-CM

## 2014-12-22 LAB — CBC WITH DIFFERENTIAL/PLATELET
BASOS ABS: 0 10*3/uL (ref 0.0–0.1)
BASOS PCT: 0 % (ref 0–1)
Eosinophils Absolute: 0.1 10*3/uL (ref 0.0–0.7)
Eosinophils Relative: 1 % (ref 0–5)
HCT: 31.5 % — ABNORMAL LOW (ref 39.0–52.0)
Hemoglobin: 10.2 g/dL — ABNORMAL LOW (ref 13.0–17.0)
Lymphocytes Relative: 14 % (ref 12–46)
Lymphs Abs: 2 10*3/uL (ref 0.7–4.0)
MCH: 33.2 pg (ref 26.0–34.0)
MCHC: 32.4 g/dL (ref 30.0–36.0)
MCV: 102.6 fL — AB (ref 78.0–100.0)
Monocytes Absolute: 2.2 10*3/uL — ABNORMAL HIGH (ref 0.1–1.0)
Monocytes Relative: 16 % — ABNORMAL HIGH (ref 3–12)
NEUTROS ABS: 9.7 10*3/uL — AB (ref 1.7–7.7)
Neutrophils Relative %: 69 % (ref 43–77)
PLATELETS: 346 10*3/uL (ref 150–400)
RBC: 3.07 MIL/uL — AB (ref 4.22–5.81)
RDW: 14.7 % (ref 11.5–15.5)
WBC: 14 10*3/uL — AB (ref 4.0–10.5)

## 2014-12-22 LAB — BLOOD GAS, ARTERIAL
Acid-Base Excess: 2.2 mmol/L — ABNORMAL HIGH (ref 0.0–2.0)
BICARBONATE: 26.3 meq/L — AB (ref 20.0–24.0)
Drawn by: 252031
O2 SAT: 95 %
PO2 ART: 68.8 mmHg — AB (ref 80.0–100.0)
Patient temperature: 98.6
TCO2: 27.6 mmol/L (ref 0–100)
pCO2 arterial: 41.3 mmHg (ref 35.0–45.0)
pH, Arterial: 7.421 (ref 7.350–7.450)

## 2014-12-22 LAB — LIPID PANEL
CHOLESTEROL: 108 mg/dL (ref 0–200)
HDL: 39 mg/dL — ABNORMAL LOW (ref >39)
LDL Cholesterol: 45 mg/dL (ref 0–99)
TRIGLYCERIDES: 118 mg/dL (ref <150)
Total CHOL/HDL Ratio: 2.8 RATIO
VLDL: 24 mg/dL (ref 0–40)

## 2014-12-22 LAB — RENAL FUNCTION PANEL
ALBUMIN: 1.9 g/dL — AB (ref 3.5–5.2)
ANION GAP: 11 (ref 5–15)
BUN: 55 mg/dL — AB (ref 6–23)
CHLORIDE: 97 mmol/L (ref 96–112)
CO2: 27 mmol/L (ref 19–32)
CREATININE: 10.68 mg/dL — AB (ref 0.50–1.35)
Calcium: 8.8 mg/dL (ref 8.4–10.5)
GFR calc Af Amer: 5 mL/min — ABNORMAL LOW (ref >90)
GFR calc non Af Amer: 4 mL/min — ABNORMAL LOW (ref >90)
GLUCOSE: 249 mg/dL — AB (ref 70–99)
Phosphorus: 4.5 mg/dL (ref 2.3–4.6)
Potassium: 4 mmol/L (ref 3.5–5.1)
Sodium: 135 mmol/L (ref 135–145)

## 2014-12-22 LAB — BASIC METABOLIC PANEL
ANION GAP: 14 (ref 5–15)
BUN: 48 mg/dL — ABNORMAL HIGH (ref 6–23)
CHLORIDE: 98 mmol/L (ref 96–112)
CO2: 25 mmol/L (ref 19–32)
Calcium: 9.1 mg/dL (ref 8.4–10.5)
Creatinine, Ser: 9.79 mg/dL — ABNORMAL HIGH (ref 0.50–1.35)
GFR, EST AFRICAN AMERICAN: 5 mL/min — AB (ref >90)
GFR, EST NON AFRICAN AMERICAN: 5 mL/min — AB (ref >90)
Glucose, Bld: 260 mg/dL — ABNORMAL HIGH (ref 70–99)
Potassium: 4 mmol/L (ref 3.5–5.1)
Sodium: 137 mmol/L (ref 135–145)

## 2014-12-22 LAB — CBC
HEMATOCRIT: 28.5 % — AB (ref 39.0–52.0)
Hemoglobin: 9.2 g/dL — ABNORMAL LOW (ref 13.0–17.0)
MCH: 33.2 pg (ref 26.0–34.0)
MCHC: 32.3 g/dL (ref 30.0–36.0)
MCV: 102.9 fL — ABNORMAL HIGH (ref 78.0–100.0)
Platelets: 336 10*3/uL (ref 150–400)
RBC: 2.77 MIL/uL — ABNORMAL LOW (ref 4.22–5.81)
RDW: 14.7 % (ref 11.5–15.5)
WBC: 13.3 10*3/uL — ABNORMAL HIGH (ref 4.0–10.5)

## 2014-12-22 LAB — SEDIMENTATION RATE: Sed Rate: 134 mm/hr — ABNORMAL HIGH (ref 0–16)

## 2014-12-22 LAB — GLUCOSE, CAPILLARY
GLUCOSE-CAPILLARY: 280 mg/dL — AB (ref 70–99)
Glucose-Capillary: 219 mg/dL — ABNORMAL HIGH (ref 70–99)

## 2014-12-22 LAB — MRSA PCR SCREENING: MRSA by PCR: NEGATIVE

## 2014-12-22 LAB — INFLUENZA PANEL BY PCR (TYPE A & B)
H1N1 flu by pcr: NOT DETECTED
Influenza A By PCR: NEGATIVE
Influenza B By PCR: NEGATIVE

## 2014-12-22 LAB — VALPROIC ACID LEVEL: VALPROIC ACID LVL: 41.3 ug/mL — AB (ref 50.0–100.0)

## 2014-12-22 LAB — AMMONIA: Ammonia: 33 umol/L — ABNORMAL HIGH (ref 11–32)

## 2014-12-22 MED ORDER — ENOXAPARIN SODIUM 30 MG/0.3ML ~~LOC~~ SOLN
30.0000 mg | SUBCUTANEOUS | Status: DC
  Administered 2014-12-22 – 2014-12-24 (×3): 30 mg via SUBCUTANEOUS
  Filled 2014-12-22 (×3): qty 0.3

## 2014-12-22 MED ORDER — SIMETHICONE 80 MG PO CHEW
80.0000 mg | CHEWABLE_TABLET | Freq: Two times a day (BID) | ORAL | Status: DC | PRN
  Filled 2014-12-22: qty 1

## 2014-12-22 MED ORDER — ACETAMINOPHEN 325 MG PO TABS
650.0000 mg | ORAL_TABLET | Freq: Once | ORAL | Status: AC
  Administered 2014-12-22: 650 mg via ORAL
  Filled 2014-12-22: qty 2

## 2014-12-22 MED ORDER — LIDOCAINE-PRILOCAINE 2.5-2.5 % EX CREA: 1.0000 "application " | TOPICAL_CREAM | CUTANEOUS | Status: DC | PRN

## 2014-12-22 MED ORDER — VANCOMYCIN HCL 10 G IV SOLR
2000.0000 mg | INTRAVENOUS | Status: AC
  Administered 2014-12-22: 2000 mg via INTRAVENOUS
  Filled 2014-12-22: qty 2000

## 2014-12-22 MED ORDER — METOPROLOL SUCCINATE 12.5 MG HALF TABLET
12.5000 mg | ORAL_TABLET | Freq: Every day | ORAL | Status: DC
  Administered 2014-12-22 – 2014-12-24 (×4): 12.5 mg via ORAL
  Filled 2014-12-22 (×5): qty 1

## 2014-12-22 MED ORDER — DIVALPROEX SODIUM ER 500 MG PO TB24
1500.0000 mg | ORAL_TABLET | Freq: Every day | ORAL | Status: DC
  Administered 2014-12-22 – 2014-12-24 (×3): 1500 mg via ORAL
  Filled 2014-12-22 (×3): qty 3

## 2014-12-22 MED ORDER — HEPARIN SODIUM (PORCINE) 1000 UNIT/ML DIALYSIS
20.0000 [IU]/kg | INTRAMUSCULAR | Status: DC | PRN
Start: 2014-12-22 — End: 2014-12-22

## 2014-12-22 MED ORDER — LIDOCAINE HCL (PF) 1 % IJ SOLN
5.0000 mL | INTRAMUSCULAR | Status: DC | PRN
Start: 2014-12-22 — End: 2014-12-22

## 2014-12-22 MED ORDER — ALTEPLASE 2 MG IJ SOLR: 2.0000 mg | Freq: Once | INTRAMUSCULAR | Status: DC | PRN

## 2014-12-22 MED ORDER — ASPIRIN EC 325 MG PO TBEC
325.0000 mg | DELAYED_RELEASE_TABLET | Freq: Every day | ORAL | Status: DC
  Administered 2014-12-22 – 2014-12-24 (×3): 325 mg via ORAL
  Filled 2014-12-22 (×4): qty 1

## 2014-12-22 MED ORDER — NEPRO/CARBSTEADY PO LIQD: 237.0000 mL | ORAL | Status: DC | PRN

## 2014-12-22 MED ORDER — INSULIN ASPART 100 UNIT/ML ~~LOC~~ SOLN
0.0000 [IU] | Freq: Three times a day (TID) | SUBCUTANEOUS | Status: DC
  Administered 2014-12-23 – 2014-12-24 (×3): 3 [IU] via SUBCUTANEOUS
  Administered 2014-12-24: 2 [IU] via SUBCUTANEOUS
  Administered 2014-12-24: 5 [IU] via SUBCUTANEOUS
  Administered 2014-12-25: 3 [IU] via SUBCUTANEOUS

## 2014-12-22 MED ORDER — PENTAFLUOROPROP-TETRAFLUOROETH EX AERO: 1.0000 "application " | INHALATION_SPRAY | CUTANEOUS | Status: DC | PRN

## 2014-12-22 MED ORDER — ESCITALOPRAM OXALATE 5 MG PO TABS
5.0000 mg | ORAL_TABLET | Freq: Every day | ORAL | Status: DC
  Administered 2014-12-22 – 2014-12-24 (×3): 5 mg via ORAL
  Filled 2014-12-22 (×3): qty 1

## 2014-12-22 MED ORDER — ATORVASTATIN CALCIUM 20 MG PO TABS
20.0000 mg | ORAL_TABLET | Freq: Every day | ORAL | Status: DC
  Administered 2014-12-22 – 2014-12-23 (×3): 20 mg via ORAL
  Filled 2014-12-22 (×4): qty 1

## 2014-12-22 MED ORDER — SENNOSIDES-DOCUSATE SODIUM 8.6-50 MG PO TABS
1.0000 | ORAL_TABLET | Freq: Every evening | ORAL | Status: DC | PRN
Start: 2014-12-22 — End: 2014-12-25

## 2014-12-22 MED ORDER — NITROGLYCERIN 0.4 MG SL SUBL: 0.4000 mg | SUBLINGUAL_TABLET | SUBLINGUAL | Status: DC | PRN

## 2014-12-22 MED ORDER — CARBIDOPA-LEVODOPA 25-100 MG PO TABS
1.0000 | ORAL_TABLET | Freq: Three times a day (TID) | ORAL | Status: DC
  Administered 2014-12-22 – 2014-12-24 (×5): 1 via ORAL
  Filled 2014-12-22 (×10): qty 1

## 2014-12-22 MED ORDER — SODIUM CHLORIDE 0.9 % IV SOLN: 100.0000 mL | INTRAVENOUS | Status: DC | PRN

## 2014-12-22 MED ORDER — VITAMIN D 1000 UNITS PO TABS
1000.0000 [IU] | ORAL_TABLET | Freq: Every day | ORAL | Status: DC
  Administered 2014-12-22 – 2014-12-24 (×3): 1000 [IU] via ORAL
  Filled 2014-12-22 (×3): qty 1

## 2014-12-22 MED ORDER — DEXTROSE 5 % IV SOLN
2.0000 g | INTRAVENOUS | Status: AC
  Administered 2014-12-22: 2 g via INTRAVENOUS
  Filled 2014-12-22: qty 2

## 2014-12-22 MED ORDER — CINACALCET HCL 30 MG PO TABS
30.0000 mg | ORAL_TABLET | Freq: Every day | ORAL | Status: DC
  Administered 2014-12-22 – 2014-12-23 (×2): 30 mg via ORAL
  Filled 2014-12-22 (×3): qty 1

## 2014-12-22 MED ORDER — HEPARIN SODIUM (PORCINE) 1000 UNIT/ML DIALYSIS: 1000.0000 [IU] | INTRAMUSCULAR | Status: DC | PRN

## 2014-12-22 MED ORDER — DEXTROSE 5 % IV SOLN
2.0000 g | INTRAVENOUS | Status: DC
  Administered 2014-12-23: 2 g via INTRAVENOUS
  Filled 2014-12-22 (×2): qty 2

## 2014-12-22 MED ORDER — OCUVITE-LUTEIN PO CAPS
1.0000 | ORAL_CAPSULE | Freq: Every day | ORAL | Status: DC
  Administered 2014-12-22 – 2014-12-24 (×3): 1 via ORAL
  Filled 2014-12-22 (×3): qty 1

## 2014-12-22 MED ORDER — GABAPENTIN 300 MG PO CAPS
300.0000 mg | ORAL_CAPSULE | Freq: Every day | ORAL | Status: DC
  Administered 2014-12-22 – 2014-12-23 (×3): 300 mg via ORAL
  Filled 2014-12-22 (×4): qty 1

## 2014-12-22 MED ORDER — SODIUM CHLORIDE 0.9 % IJ SOLN
3.0000 mL | Freq: Two times a day (BID) | INTRAMUSCULAR | Status: DC
  Administered 2014-12-22 – 2014-12-25 (×8): 3 mL via INTRAVENOUS

## 2014-12-22 MED ORDER — VANCOMYCIN HCL IN DEXTROSE 1-5 GM/200ML-% IV SOLN
1000.0000 mg | INTRAVENOUS | Status: DC
  Filled 2014-12-22: qty 200

## 2014-12-22 NOTE — Progress Notes (Signed)
ANTIBIOTIC CONSULT NOTE - INITIAL  Pharmacy Consult for Vancomycin and Cefepime Indication:  Sepsis  No Known Allergies  Patient Measurements: Height: 6\' 4"  (193 cm) Weight: 261 lb 1.6 oz (118.434 kg) IBW/kg (Calculated) : 86.8   Vital Signs: Temp: 98.5 F (36.9 C) (04/07 2303) Temp Source: Oral (04/07 2303) BP: 110/60 mmHg (04/07 2303) Pulse Rate: 83 (04/07 2303) Intake/Output from previous day: 04/07 0701 - 04/08 0700 In: -  Out: 50 [Urine:50] Intake/Output from this shift:    Labs:  Recent Labs  12/21/14 1350  WBC 15.9*  HGB 11.2*  PLT 354  CREATININE 8.59*   Estimated Creatinine Clearance: 10.6 mL/min (by C-G formula based on Cr of 8.59). No results for input(s): VANCOTROUGH, VANCOPEAK, VANCORANDOM, GENTTROUGH, GENTPEAK, GENTRANDOM, TOBRATROUGH, TOBRAPEAK, TOBRARND, AMIKACINPEAK, AMIKACINTROU, AMIKACIN in the last 72 hours.   Microbiology: No results found for this or any previous visit (from the past 720 hour(s)).  Medical History: Past Medical History  Diagnosis Date  . ESRD on hemodialysis     Started HD around 2009, maybe longer.  Was living in [place], [place] then.  Moved to [place] in 2014 and gets HD now at [facility] [address] on a TTS schedule.  ESRD was due to DM and HTN.     Marland Kitchen Peripheral vascular disease   . Hypertension   . Hyperlipidemia   . Arthritis     Gout  . Paroxysmal atrial fibrillation   . Brain tumor 2009  . Seizure disorder 2009  . Anemia   . Hyperparathyroidism, secondary   . Diabetes mellitus type 2 with complications     Type II, diet controlled  . Proteus mirabilis infection   . CHF (congestive heart failure)     grade 1 DD, preserved EF, [facility] records  . 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 brain tumor  . GERD (gastroesophageal reflux disease)   . Hx of cardiac cath     a. LHC (9/14):  Normal cors, EF 65%.     Medications:  Scheduled:  . aspirin EC  325 mg  Oral Daily  . atorvastatin  20 mg Oral QHS  . carbidopa-levodopa  1 tablet Oral TID AC  . ceFEPime (MAXIPIME) IV  2 g Intravenous STAT  . cholecalciferol  1,000 Units Oral Daily  . cinacalcet  30 mg Oral QPC supper  . divalproex  1,500 mg Oral Daily  . enoxaparin (LOVENOX) injection  30 mg Subcutaneous Q24H  . escitalopram  5 mg Oral Daily  . gabapentin  300 mg Oral QHS  . insulin aspart  0-9 Units Subcutaneous TID WC  . metoprolol succinate  12.5 mg Oral QHS  . multivitamin-lutein  1 capsule Oral Daily  . sodium chloride  3 mL Intravenous Q12H  . vancomycin  2,000 mg Intravenous STAT   Assessment: 74 y.o male with ESRD on HD.  At the end of dialysis on 12/21/14 the pt became hypotensive so HD was stopped and he was sent to the ED. Febrile to 100.8, WBC 15.9K. Found to have a draining right foot ulcer with surrounding cellulitis. Right foot xrays ordered to r/o osteomyelitis. He received Ceftriaxone 1gm IV in ED on 4/7 @ 17:30. Pharmacy now consulted to start on IV vancomycin and zosyn.  Goal of Therapy:  Pre HD vancomycin level = 15-25 mcg/ml  Plan:  Cefepime 2 g IV STAT thent 2g IV qHD (TTS) Vancomycin 2 g IV STAT then 1 g  IV qHD (TTS) Follow up clinical status and culture results daily.  Thank you for allowing pharmacy to be part of this patients care team. Nicole Cella, RPh Clinical Pharmacist Pager: [phone] 12/22/2014,1:07 AM

## 2014-12-22 NOTE — Progress Notes (Signed)
Inpatient Diabetes Program Recommendations  AACE/ADA: New Consensus Statement on Inpatient Glycemic Control (2013)  Target Ranges:  Prepandial:   less than 140 mg/dL      Peak postprandial:   less than 180 mg/dL (1-2 hours)      Critically ill patients:  140 - 180 mg/dL   Reason for Assessment:  Results for [name] [name], [name] [name] (MRN [id number]) as of 12/22/2014 13:13  Ref. Range 12/21/2014 13:36 12/21/2014 18:43 12/21/2014 23:22  Glucose-Capillary Latest Range: 70-99 mg/dL 294 (H) 302 (H) 280 (H)    Please consider A1C to determine pre-hospitalization glycemic control.  Note that Novolog sensitive correction added today. If CBG's remain greater than goal,  may consider low dose basal insulin such as Levemir 8 units daily.  Thanks, Adah Perl, RN, BC-ADM Inpatient Diabetes Coordinator Pager [phone] (8a-5p)

## 2014-12-22 NOTE — Progress Notes (Signed)
Routine EEG completed at bedside, results pending. 

## 2014-12-22 NOTE — Consult Note (Signed)
[facility] Renal Consultation Note  Indication for Consultation:  Management of ESRD/hemodialysis; anemia, hypertension/volume and secondary hyperparathyroidism  HPI: [name] [name] is a 74 y.o. male resident of [place] with a history of Diabetes type 2, peripheral vascular disease s/p left BKA, atrial fibrillation, brain tumor s/p resection in 2009, Parkinson's, and ESRD on dialysis at the [facility] who has had recently worsening mental status and yesterday presented to dialysis with somnolence, confusion, and hypotension, requiring treatment to be stopped after less than 30 minutes and transfer to the ED for evaluation.  CT of the head showed no acute abnormalities, and chest x-ray showed no definite infiltrate.  His urinalysis suggested infection, but his symptoms are most likely secondary to a worsening right heel ulcer, which has been followed by wound care and is currently with discharge and eschar.  X-rays showed diffuse soft tissue edema with subcutaneous gas over the posterior calcaneus, but no osteomyelitis.  He was started on IV Vancomycin and Cefepime and remains lethargic and slightly confused.   Dialysis Orders:   TTS @ [place] 119 kg     4.5 hrs     400/800      2K/2Ca      Profile 3     Heparin 6000 U    AVF @ [facility] No Hectorol          Aranesp 25 mcg on Thurs          No Venofer  Past Medical History  Diagnosis Date  . ESRD on hemodialysis     Started HD around 2009, maybe longer.  Was living in [place], [place] then.  Moved to [place] in 2014 and gets HD now at [facility] on [address] on a TTS schedule.  ESRD was due to DM and HTN.     Marland Kitchen Peripheral vascular disease   . Hypertension   . Hyperlipidemia   . Arthritis     Gout  . Paroxysmal atrial fibrillation   . Brain tumor 2009  . Seizure disorder 2009  . Anemia   . Hyperparathyroidism, secondary   . Diabetes mellitus type 2 with complications     Type II, diet controlled  . Proteus  mirabilis infection   . CHF (congestive heart failure)     grade 1 DD, preserved EF, [facility] records  . 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 brain tumor  . GERD (gastroesophageal reflux disease)   . Hx of cardiac cath     a. LHC (9/14):  Normal cors, EF 65%.    Past Surgical History  Procedure Laterality Date  . Av fistula placement Left     arm  . 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 Eye surgery Bilateral 2000    cataract  . Retinopathy surgery      Hx. of laser treatments for diabetics  . Craniectomy / craniotomy for excision of brain tumor  2009  . Fistulogram Left 05/28/2014    Procedure: FISTULOGRAM (ARM);  Surgeon: Conrad Rosewood, MD;  Location: [place];  Service: Vascular;  Laterality: Left;  Angioplasty of left sephalic vein times two.  . Revision of arteriovenous goretex graft Left 05/28/2014    Procedure: POSSIBLE REVISION OF FISTULA;  Surgeon: Conrad Clam Gulch, MD;  Location: [place];  Service: Vascular;  Laterality: Left;  Revision of Left arm fistula.  Darden Dates without cardioversion  N/A 05/29/2014    Procedure: TRANSESOPHAGEAL ECHOCARDIOGRAM (TEE);  Surgeon: Fay Records, MD;  Location: [facility] ENDOSCOPY;  Service: Cardiovascular;  Laterality: N/A;  . Radiology with anesthesia Left 05/31/2014    Procedure: MRI WITH ANESTHESIA, Left hip;  Surgeon: Medication Radiologist, MD;  Location: [place];  Service: Radiology;  Laterality: Left;  Dr Jacinta Shoe ordered  . Left heart catheterization with coronary angiogram Bilateral 05/27/2013    Procedure: LEFT HEART CATHETERIZATION WITH CORONARY ANGIOGRAM;  Surgeon: Burnell Blanks, MD;  Location: [facility] CATH LAB;  Service: Cardiovascular;  Laterality: Bilateral;  . Shuntogram Left 12/04/2014    Procedure: FISTULOGRAM;  Surgeon: Conrad Florence, MD;  Location: [facility] CATH LAB;  Service: Cardiovascular;  Laterality: Left;   Family History  Problem Relation Age of  Onset  . Seizures Mother   . Stroke Mother 10  . Hypertension Mother   . Alcohol abuse Father   . Hypertension Father   . Arthritis Sister   . Heart attack Neg Hx   . Heart failure Neg Hx    Social History  He previously drank occasional alcohol, but denies any history of tobacco or illicit drug use.  No Known Allergies Prior to Admission medications   Medication Sig Start Date End Date Taking? Authorizing Provider  acetaminophen (TYLENOL) 325 MG tablet Take 650 mg by mouth every 6 (six) hours as needed for moderate pain.   Yes Historical Provider, MD  atorvastatin (LIPITOR) 20 MG tablet Take 20 mg by mouth at bedtime.    Yes Historical Provider, MD  B Complex-C-Folic Acid (NEPHROCAPS PO) Take 1 capsule by mouth daily at 6 PM.   Yes Historical Provider, MD  carbidopa-levodopa (SINEMET IR) 25-100 MG per tablet Take 1 tablet by mouth 3 (three) times daily. Give 30 minutes before meals 12/06/14  Yes Penni Bombard, MD  Cholecalciferol (VITAMIN D3) 1000 UNITS CAPS Take 1,000 Units by mouth daily.    Yes Historical Provider, MD  cinacalcet (SENSIPAR) 30 MG tablet Take 1 tablet (30 mg total) by mouth daily after supper. 06/01/14  Yes Kinnie Feil, MD  divalproex (DEPAKOTE ER) 500 MG 24 hr tablet Take 3 tablets (1,500 mg total) by mouth daily. 12/06/14  Yes Penni Bombard, MD  escitalopram (LEXAPRO) 5 MG tablet Take 1 tablet (5 mg total) by mouth daily. 06/01/14  Yes Kinnie Feil, MD  gabapentin (NEURONTIN) 300 MG capsule Take 1 capsule (300 mg total) by mouth at bedtime. 06/01/14  Yes Kinnie Feil, MD  Menthol, Topical Analgesic, 4 % GEL Apply 1 application topically 4 (four) times daily as needed (neck pain).   Yes Historical Provider, MD  metoprolol succinate (TOPROL-XL) 25 MG 24 hr tablet Take 12.5 mg by mouth at bedtime.   Yes Historical Provider, MD  Multiple Vitamins-Minerals (DECUBI-VITE) CAPS Take 1 capsule by mouth daily.   Yes Historical Provider, MD  nitroGLYCERIN  (NITROSTAT) 0.4 MG SL tablet Place 1 tablet (0.4 mg total) under the tongue every 5 (five) minutes x 3 doses as needed for chest pain. 05/28/13  Yes Nita Sells, MD  simethicone (MYLICON) 80 MG chewable tablet Chew 80 mg by mouth 2 (two) times daily as needed for flatulence.   Yes Historical Provider, MD  aspirin EC 325 MG tablet Take 325 mg by mouth daily.    Historical Provider, MD  darbepoetin (ARANESP) 60 MCG/0.3ML SOLN injection Inject 0.3 mLs (60 mcg total) into the vein every Thursday with hemodialysis. 06/01/14   Kinnie Feil, MD  oxyCODONE-acetaminophen (PERCOCET/ROXICET) 5-325 MG per tablet Take 1 tablet by mouth every 6 (six) hours as needed for severe pain. 06/01/14   Kinnie Feil, MD   Labs:  Results for orders placed or performed during the hospital encounter of 12/21/14 (from the past 48 hour(s))  CBG, ED     Status: Abnormal   Collection Time: 12/21/14  1:36 PM  Result Value Ref Range   Glucose-Capillary 294 (H) 70 - 99 mg/dL  CBC  (at [facility])     Status: Abnormal   Collection Time: 12/21/14  1:50 PM  Result Value Ref Range   WBC 15.9 (H) 4.0 - 10.5 K/uL   RBC 3.32 (L) 4.22 - 5.81 MIL/uL   Hemoglobin 11.2 (L) 13.0 - 17.0 g/dL   HCT 34.0 (L) 39.0 - 52.0 %   MCV 102.4 (H) 78.0 - 100.0 fL   MCH 33.7 26.0 - 34.0 pg   MCHC 32.9 30.0 - 36.0 g/dL   RDW 14.7 11.5 - 15.5 %   Platelets 354 150 - 400 K/uL  Basic metabolic panel  (at [facility])     Status: Abnormal   Collection Time: 12/21/14  1:50 PM  Result Value Ref Range   Sodium 134 (L) 135 - 145 mmol/L   Potassium 3.9 3.5 - 5.1 mmol/L   Chloride 92 (L) 96 - 112 mmol/L   CO2 26 19 - 32 mmol/L   Glucose, Bld 334 (H) 70 - 99 mg/dL   BUN 41 (H) 6 - 23 mg/dL   Creatinine, Ser 8.59 (H) 0.50 - 1.35 mg/dL   Calcium 9.1 8.4 - 10.5 mg/dL   GFR calc non Af Amer 5 (L) >90 mL/min   GFR calc Af Amer 6 (L) >90 mL/min    Comment: (NOTE) The eGFR has been calculated using the CKD EPI equation. This  calculation has not been validated in all clinical situations. eGFR's persistently <90 mL/min signify possible Chronic Kidney Disease.    Anion gap 16 (H) 5 - 15  I-Stat Troponin, ED (not at [facility])     Status: None   Collection Time: 12/21/14  1:59 PM  Result Value Ref Range   Troponin i, poc 0.02 0.00 - 0.08 ng/mL   Comment 3            Comment: Due to the release kinetics of cTnI, a negative result within the first hours of the onset of symptoms does not rule out myocardial infarction with certainty. If myocardial infarction is still suspected, repeat the test at appropriate intervals.   I-Stat CG4 Lactic Acid, ED     Status: Abnormal   Collection Time: 12/21/14  2:50 PM  Result Value Ref Range   Lactic Acid, Venous 2.21 (HH) 0.5 - 2.0 mmol/L   Comment NOTIFIED PHYSICIAN   Urine culture     Status: None (Preliminary result)   Collection Time: 12/21/14  4:34 PM  Result Value Ref Range   Specimen Description URINE, CATHETERIZED    Special Requests Immunocompromised    Colony Count      >=100,000 COLONIES/ML Performed at [place] Performed at [facility]    Report Status PENDING   Urinalysis, Routine w reflex microscopic     Status: Abnormal   Collection Time: 12/21/14  4:39 PM  Result Value Ref Range   Color, Urine BROWN (A) YELLOW    Comment: BIOCHEMICALS MAY BE AFFECTED  BY COLOR   APPearance TURBID (A) CLEAR   Specific Gravity, Urine 1.022 1.005 - 1.030   pH 7.0 5.0 - 8.0   Glucose, UA NEGATIVE NEGATIVE mg/dL   Hgb urine dipstick LARGE (A) NEGATIVE   Bilirubin Urine SMALL (A) NEGATIVE   Ketones, ur 15 (A) NEGATIVE mg/dL   Protein, ur >300 (A) NEGATIVE mg/dL   Urobilinogen, UA 1.0 0.0 - 1.0 mg/dL   Nitrite NEGATIVE NEGATIVE   Leukocytes, UA LARGE (A) NEGATIVE  Urine microscopic-add on     Status: Abnormal   Collection Time: 12/21/14  4:39 PM  Result Value Ref Range   Squamous Epithelial / LPF FEW (A) RARE    WBC, UA TOO NUMEROUS TO COUNT <3 WBC/hpf   RBC / HPF TOO NUMEROUS TO COUNT <3 RBC/hpf   Bacteria, UA MANY (A) RARE   Casts GRANULAR CAST (A) NEGATIVE  Blood culture (routine x 2)     Status: None (Preliminary result)   Collection Time: 12/21/14  4:57 PM  Result Value Ref Range   Specimen Description BLOOD RIGHT ANTECUBITAL    Special Requests BOTTLES DRAWN AEROBIC ONLY 2CC    Culture             BLOOD CULTURE RECEIVED NO GROWTH TO DATE CULTURE WILL BE HELD FOR 5 DAYS BEFORE ISSUING A FINAL NEGATIVE REPORT Performed at [facility]    Report Status PENDING   Blood culture (routine x 2)     Status: None (Preliminary result)   Collection Time: 12/21/14  5:09 PM  Result Value Ref Range   Specimen Description BLOOD RIGHT HAND    Special Requests BOTTLES DRAWN AEROBIC AND ANAEROBIC 5CC    Culture             BLOOD CULTURE RECEIVED NO GROWTH TO DATE CULTURE WILL BE HELD FOR 5 DAYS BEFORE ISSUING A FINAL NEGATIVE REPORT Performed at [facility]    Report Status PENDING   CBG monitoring, ED     Status: Abnormal   Collection Time: 12/21/14  6:43 PM  Result Value Ref Range   Glucose-Capillary 302 (H) 70 - 99 mg/dL   Comment 1 Notify RN    Comment 2 Document in Chart   Glucose, capillary     Status: Abnormal   Collection Time: 12/21/14 11:22 PM  Result Value Ref Range   Glucose-Capillary 280 (H) 70 - 99 mg/dL  MRSA PCR Screening     Status: None   Collection Time: 12/22/14 12:32 AM  Result Value Ref Range   MRSA by PCR NEGATIVE NEGATIVE    Comment:        The GeneXpert MRSA Assay (FDA approved for NASAL specimens only), is one component of a comprehensive MRSA colonization surveillance program. It is not intended to diagnose MRSA infection nor to guide or monitor treatment for MRSA infections.   Blood gas, arterial     Status: Abnormal   Collection Time: 12/22/14 12:55 AM  Result Value Ref Range   pH, Arterial 7.421 7.350 - 7.450   pCO2 arterial 41.3 35.0 -  45.0 mmHg   pO2, Arterial 68.8 (L) 80.0 - 100.0 mmHg   Bicarbonate 26.3 (H) 20.0 - 24.0 mEq/L   TCO2 27.6 0 - 100 mmol/L   Acid-Base Excess 2.2 (H) 0.0 - 2.0 mmol/L   O2 Saturation 95.0 %   Patient temperature 98.6    Collection site RIGHT RADIAL    Drawn by [id number]    Sample type ARTERIAL DRAW  Allens test (pass/fail) PASS PASS  Basic metabolic panel     Status: Abnormal   Collection Time: 12/22/14  3:20 AM  Result Value Ref Range   Sodium 137 135 - 145 mmol/L   Potassium 4.0 3.5 - 5.1 mmol/L   Chloride 98 96 - 112 mmol/L   CO2 25 19 - 32 mmol/L   Glucose, Bld 260 (H) 70 - 99 mg/dL   BUN 48 (H) 6 - 23 mg/dL   Creatinine, Ser 9.79 (H) 0.50 - 1.35 mg/dL   Calcium 9.1 8.4 - 10.5 mg/dL   GFR calc non Af Amer 5 (L) >90 mL/min   GFR calc Af Amer 5 (L) >90 mL/min    Comment: (NOTE) The eGFR has been calculated using the CKD EPI equation. This calculation has not been validated in all clinical situations. eGFR's persistently <90 mL/min signify possible Chronic Kidney Disease.    Anion gap 14 5 - 15  CBC with Differential/Platelet     Status: Abnormal   Collection Time: 12/22/14  3:20 AM  Result Value Ref Range   WBC 14.0 (H) 4.0 - 10.5 K/uL   RBC 3.07 (L) 4.22 - 5.81 MIL/uL   Hemoglobin 10.2 (L) 13.0 - 17.0 g/dL   HCT 31.5 (L) 39.0 - 52.0 %   MCV 102.6 (H) 78.0 - 100.0 fL   MCH 33.2 26.0 - 34.0 pg   MCHC 32.4 30.0 - 36.0 g/dL   RDW 14.7 11.5 - 15.5 %   Platelets 346 150 - 400 K/uL   Neutrophils Relative % 69 43 - 77 %   Neutro Abs 9.7 (H) 1.7 - 7.7 K/uL   Lymphocytes Relative 14 12 - 46 %   Lymphs Abs 2.0 0.7 - 4.0 K/uL   Monocytes Relative 16 (H) 3 - 12 %   Monocytes Absolute 2.2 (H) 0.1 - 1.0 K/uL   Eosinophils Relative 1 0 - 5 %   Eosinophils Absolute 0.1 0.0 - 0.7 K/uL   Basophils Relative 0 0 - 1 %   Basophils Absolute 0.0 0.0 - 0.1 K/uL  Influenza panel by PCR (type A & B, H1N1)     Status: None   Collection Time: 12/22/14  3:34 AM  Result Value Ref Range    Influenza A By PCR NEGATIVE NEGATIVE   Influenza B By PCR NEGATIVE NEGATIVE   H1N1 flu by pcr NOT DETECTED NOT DETECTED    Comment:        The Xpert Flu assay (FDA approved for nasal aspirates or washes and nasopharyngeal swab specimens), is intended as an aid in the diagnosis of influenza and should not be used as a sole basis for treatment.   Lipid panel     Status: Abnormal   Collection Time: 12/22/14  7:30 AM  Result Value Ref Range   Cholesterol 108 0 - 200 mg/dL   Triglycerides 118 <150 mg/dL   HDL 39 (L) >39 mg/dL   Total CHOL/HDL Ratio 2.8 RATIO   VLDL 24 0 - 40 mg/dL   LDL Cholesterol 45 0 - 99 mg/dL    Comment:        Total Cholesterol/HDL:CHD Risk Coronary Heart Disease Risk Table                     Men   Women  1/2 Average Risk   3.4   3.3  Average Risk       5.0   4.4  2 X Average Risk   9.6  7.1  3 X Average Risk  23.4   11.0        Use the calculated Patient Ratio above and the CHD Risk Table to determine the patient's CHD Risk.        ATP III CLASSIFICATION (LDL):  <100     mg/dL   Optimal  100-129  mg/dL   Near or Above                    Optimal  130-159  mg/dL   Borderline  160-189  mg/dL   High  >190     mg/dL   Very High    Constitutional: negative for chills, fatigue, fevers and sweats Ears, nose, mouth, throat, and face: negative for earaches, hoarseness, nasal congestion and sore throat Respiratory: negative for cough, dyspnea on exertion, hemoptysis and sputum Cardiovascular: negative for chest pain, chest pressure/discomfort, dyspnea, orthopnea and palpitations Gastrointestinal: negative for abdominal pain, change in bowel habits, nausea and vomiting Genitourinary:negative, oliguric Musculoskeletal:negative for arthralgias, back pain, myalgias and neck pain Neurological: positive for weakness; negative for dizziness, headaches and paresthesia  Physical Exam: Filed Vitals:   12/22/14 0900  BP: 102/58  Pulse: 74  Temp: 99 F (37.2 C)   Resp: 20     General appearance: Somnolent, but able to arouse, in no apparent distress Head: Normocephalic, without obvious abnormality, atraumatic Neck: no adenopathy, no carotid bruit, no JVD and supple, symmetrical, trachea midline Resp: clear to auscultation bilaterally Cardio: regular rate and rhythm, S1, S2 normal, no murmur, click, rub or gallop GI: soft, non-tender; bowel sounds normal; no masses,  no organomegaly Extremities: Trace edema on right with protective boot, malodorous right heel wound with discharge, left BKA Neurologic: Somnolent, slightly confused Dialysis Access: AVF @ [facility] with + bruit   Assessment/Plan: 1. AMS - likely sec to worsening necrotic R heel wound, likely requiring amputation per evaluation by [facility], started Vancomycin & Cefepime.  2. ESRD - HD on TTS @ [place], < 30 mins yesterday, K 4.  HD pending. 3. Hypertension/volume - BP 102/58 on Metoprolol 12.5 mg qhs; wt 118.4 kg, CXR negative.  4. Anemia - Hgb 10.2 on outpatient Aranesp 25 mcg (missed yesterday). 5. Metabolic bone disease - Cholecalciferol 1000 U qd, Sensipar 30 mg qd, no binders. 6. Nutrition - renal diet, vitamin. 7. Seizure disorder - on Depakote. 8. Parkinson's - on Sinemet. 9. A-fib - on BB. 10. DM - per primary.  LYLES,CHARLES 12/22/2014, 1:11 PM   Attending Nephrologist: Roney Jaffe, MD  Pt seen, examined and agree w A/P as above.  Kelly Splinter MD pager [phone]    cell [phone] 12/22/2014, 4:55 PM

## 2014-12-22 NOTE — Progress Notes (Deleted)
[facility]  Reason for Consult: infected right heel wound Referring Physician:  Dr. Eliseo Squires MRN #:  [id number]  History of Present Illness: This is a 74 y.o. male who we've been consulted regarding a right heel wound. The history is limited by the patient's AMS. He presented to the [facility] ED on 12/21/14 with AMS and hypotensive. Following dialysis yesterday, the patient became hypotensive and was sent to the ED. He resides in a skilled nursing facility. He was last seen in the [facility] office on 11/22/14 by Dr. Bridgett Larsson for fistula check and right heel wound check. He presented previously on 09/22/14 with new plantar ulcer on his right heel. He was receiving wound care at [facility]. At the last visit, his heel ulcer was clean with granulation tissue present. The patient is wheelchair bound and has a history of right BKA. With his comorbidities and non-ambulatory status, Dr. Bridgett Larsson felt that revascularization would be of no benefit.   He has a past medical history of ESRD on HD, CHF, paroxymal atrial fibrillation, diabetes mellitus II, seizure disorder, history of brain tumor, hypertension and hyperlipidemia.   Past Medical History  Diagnosis Date  . ESRD on hemodialysis     Started HD around 2009, maybe longer.  Was living in [place], [place] then.  Moved to [place] in 2014 and gets HD now at [facility] on [address] on a TTS schedule.  ESRD was due to DM and HTN.     Marland Kitchen Peripheral vascular disease   . Hypertension   . Hyperlipidemia   . Arthritis     Gout  . Paroxysmal atrial fibrillation   . Brain tumor 2009  . Seizure disorder 2009  . Anemia   . Hyperparathyroidism, secondary   . Diabetes mellitus type 2 with complications     Type II, diet controlled  . Proteus mirabilis infection   . CHF (congestive heart failure)     grade 1 DD, preserved EF, [facility] records  . 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 brain tumor    . GERD (gastroesophageal reflux disease)   . Hx of cardiac cath     a. LHC (9/14):  Normal cors, EF 65%.    Past Surgical History  Procedure Laterality Date  . Av fistula placement Left     arm  . 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 Eye surgery Bilateral 2000    cataract  . Retinopathy surgery      Hx. of laser treatments for diabetics  . Craniectomy / craniotomy for excision of brain tumor  2009  . Fistulogram Left 05/28/2014    Procedure: FISTULOGRAM (ARM);  Surgeon: Conrad Antelope, MD;  Location: [place];  Service: Vascular;  Laterality: Left;  Angioplasty of left sephalic vein times two.  . Revision of arteriovenous goretex graft Left 05/28/2014    Procedure: POSSIBLE REVISION OF FISTULA;  Surgeon: Conrad Winthrop, MD;  Location: [facility];  Service: Vascular;  Laterality: Left;  Revision of Left arm fistula.  Darden Dates without cardioversion N/A 05/29/2014    Procedure: TRANSESOPHAGEAL ECHOCARDIOGRAM (TEE);  Surgeon: Fay Records, MD;  Location: [facility] ENDOSCOPY;  Service: Cardiovascular;  Laterality: N/A;  . Radiology with anesthesia Left 05/31/2014    Procedure: MRI WITH ANESTHESIA, Left hip;  Surgeon: Medication Radiologist, MD;  Location: [facility];  Service: Radiology;  Laterality: Left;  Dr Jacinta Shoe ordered  . Left heart catheterization with coronary angiogram Bilateral 05/27/2013    Procedure: LEFT HEART CATHETERIZATION WITH CORONARY ANGIOGRAM;  Surgeon: Burnell Blanks, MD;  Location: [facility] CATH LAB;  Service: Cardiovascular;  Laterality: Bilateral;  . Shuntogram Left 12/04/2014    Procedure: FISTULOGRAM;  Surgeon: Conrad Oshkosh, MD;  Location: [facility] CATH LAB;  Service: Cardiovascular;  Laterality: Left;    No Known Allergies  Prior to Admission medications   Medication Sig Start Date End Date Taking? Authorizing Provider  acetaminophen (TYLENOL) 325 MG tablet Take 650 mg by mouth every 6 (six) hours as needed for moderate pain.   Yes  Historical Provider, MD  atorvastatin (LIPITOR) 20 MG tablet Take 20 mg by mouth at bedtime.    Yes Historical Provider, MD  B Complex-C-Folic Acid (NEPHROCAPS PO) Take 1 capsule by mouth daily at 6 PM.   Yes Historical Provider, MD  carbidopa-levodopa (SINEMET IR) 25-100 MG per tablet Take 1 tablet by mouth 3 (three) times daily. Give 30 minutes before meals 12/06/14  Yes Penni Bombard, MD  Cholecalciferol (VITAMIN D3) 1000 UNITS CAPS Take 1,000 Units by mouth daily.    Yes Historical Provider, MD  cinacalcet (SENSIPAR) 30 MG tablet Take 1 tablet (30 mg total) by mouth daily after supper. 06/01/14  Yes Kinnie Feil, MD  divalproex (DEPAKOTE ER) 500 MG 24 hr tablet Take 3 tablets (1,500 mg total) by mouth daily. 12/06/14  Yes Penni Bombard, MD  escitalopram (LEXAPRO) 5 MG tablet Take 1 tablet (5 mg total) by mouth daily. 06/01/14  Yes Kinnie Feil, MD  gabapentin (NEURONTIN) 300 MG capsule Take 1 capsule (300 mg total) by mouth at bedtime. 06/01/14  Yes Kinnie Feil, MD  Menthol, Topical Analgesic, 4 % GEL Apply 1 application topically 4 (four) times daily as needed (neck pain).   Yes Historical Provider, MD  metoprolol succinate (TOPROL-XL) 25 MG 24 hr tablet Take 12.5 mg by mouth at bedtime.   Yes Historical Provider, MD  Multiple Vitamins-Minerals (DECUBI-VITE) CAPS Take 1 capsule by mouth daily.   Yes Historical Provider, MD  nitroGLYCERIN (NITROSTAT) 0.4 MG SL tablet Place 1 tablet (0.4 mg total) under the tongue every 5 (five) minutes x 3 doses as needed for chest pain. 05/28/13  Yes Nita Sells, MD  simethicone (MYLICON) 80 MG chewable tablet Chew 80 mg by mouth 2 (two) times daily as needed for flatulence.   Yes Historical Provider, MD  aspirin EC 325 MG tablet Take 325 mg by mouth daily.    Historical Provider, MD  darbepoetin (ARANESP) 60 MCG/0.3ML SOLN injection Inject 0.3 mLs (60 mcg total) into the vein every Thursday with hemodialysis. 06/01/14   Kinnie Feil,  MD  oxyCODONE-acetaminophen (PERCOCET/ROXICET) 5-325 MG per tablet Take 1 tablet by mouth every 6 (six) hours as needed for severe pain. 06/01/14   Kinnie Feil, MD    History   Social History  . Marital Status: Married    Spouse Name: [name]  . Number of Children: 3  . Years of Education: 12th   Occupational History  .      N/A   Social History Main Topics  . Smoking status: Never Smoker   . Smokeless tobacco: Never Used  . Alcohol Use: No  . Drug Use: No  . Sexual Activity: No   Other Topics Concern  . Not on file   Social History Narrative   Pt lives at [employer] [facility]  Caffeine Use: 1-2 cups daily.   Uses wheelchair for ambulation and prosthetic limb  Left leg.      Family History  Problem Relation Age of Onset  . Seizures Mother   . Stroke Mother 63  . Hypertension Mother   . Alcohol abuse Father   . Hypertension Father   . Arthritis Sister   . Heart attack Neg Hx   . Heart failure Neg Hx     ROS: [x]  Positive   [ ]  Negative   [ ]  All sytems reviewed and are negative ROS unable to be obtained due to AMS  Cardiovascular: []  chest pain/pressure []  palpitations []  SOB lying flat []  DOE []  pain in legs while walking []  pain in legs at rest []  pain in legs at night []  non-healing ulcers []  hx of DVT []  swelling in legs  Pulmonary: []  productive cough []  asthma/wheezing []  home O2  Neurologic: []  weakness in []  arms []  legs []  numbness in []  arms []  legs []  hx of CVA []  mini stroke [] difficulty speaking or slurred speech []  temporary loss of vision in one eye []  dizziness  Hematologic: []  hx of cancer []  bleeding problems []  problems with blood clotting easily  Endocrine:   []  diabetes []  thyroid disease  GI []  vomiting blood []  blood in stool  GU: []  CKD/renal failure []  HD--[]  M/W/F or []  T/T/S []  burning with urination []  blood in urine  Psychiatric: []  anxiety []  depression  Musculoskeletal: []   arthritis []  joint pain  Integumentary: []  rashes []  ulcers  Constitutional: []  fever []  chills   Physical Examination  Filed Vitals:   12/22/14 0900  BP: 102/58  Pulse: 74  Temp: 99 F (37.2 C)  Resp: 20   Body mass index is 31.8 kg/(m^2).  General:  Confused, resting in bed in NAD Gait: Not observed HENT: WNL, normocephalic Pulmonary: normal non-labored breathing, without Rales, rhonchi,  wheezing Cardiac: regular, without  Murmurs, rubs or gallops; without carotid bruits Abdomen: soft, NT/ND, no masses Vascular Exam/Pulses:  Right Left  Radial 2+ (normal) 2+ (normal)  Femoral 1+ (weak) 1+ (weak)  Popliteal Non palpable Non palpable  DP Non palpable, no doppler available BKA  PT Non palpable, no doppler available BKA   Extremities: with large necrotic heel wound with purulent drainage and foul odor. Left BKA.  Musculoskeletal: no muscle wasting or atrophy  Neurologic: Is confused. Oriented to person and time. Unsure of place.    CBC    Component Value Date/Time   WBC 14.0* 12/22/2014 0320   WBC 8.0 02/09/2013 1350   RBC 3.07* 12/22/2014 0320   RBC 3.43* 02/09/2013 1350   HGB 10.2* 12/22/2014 0320   HCT 31.5* 12/22/2014 0320   PLT 346 12/22/2014 0320   MCV 102.6* 12/22/2014 0320   MCH 33.2 12/22/2014 0320   MCH 31.8 02/09/2013 1350   MCHC 32.4 12/22/2014 0320   MCHC 34.3 02/09/2013 1350   RDW 14.7 12/22/2014 0320   RDW 13.7 02/09/2013 1350   LYMPHSABS 2.0 12/22/2014 0320   LYMPHSABS 2.5 02/09/2013 1350   MONOABS 2.2* 12/22/2014 0320   EOSABS 0.1 12/22/2014 0320   EOSABS 0.2 02/09/2013 1350   BASOSABS 0.0 12/22/2014 0320   BASOSABS 0.0 02/09/2013 1350    BMET    Component Value Date/Time   NA 137 12/22/2014 0320   NA 138 02/09/2013 1350   K 4.0 12/22/2014 0320   CL 98 12/22/2014 0320   CO2 25 12/22/2014 0320   GLUCOSE 260*  12/22/2014 0320   GLUCOSE 137* 02/09/2013 1350   BUN 48* 12/22/2014 0320   BUN 25 02/09/2013 1350   CREATININE 9.79*  12/22/2014 0320   CALCIUM 9.1 12/22/2014 0320   GFRNONAA 5* 12/22/2014 0320   GFRAA 5* 12/22/2014 0320    COAGS: Lab Results  Component Value Date   INR 0.97 05/26/2013    Statin:  Yes.   Beta Blocker:  Yes.   Aspirin:  Yes.   ACEI:  No. ARB:  No. Other antiplatelets/anticoagulants:  No.   ASSESSMENT: This is a 74 y.o. male with right lower extremity critical limb ischemia. His right heel wound is necrotic with purulent drainage that has progressed since he was last seen on 11/22/14 by Dr. Bridgett Larsson. He has a history of left BKA.   Other active problems: ESRD on HD TTS, acute encephalopathy likely secondary to sepsis, history of seizures, DM 2, Parkinson's disease, known sacral decubitus ulcer.   PLAN: The patient's sepsis may be due to his infected right heel wound. This wound was found to be clean with granulation tissue when it was seen 4 weeks ago. Given that he is wheelchair bound and other co-morbidities, revascularization would be of limited benefit. Will need an amputation. A below-knee amputation may heal, but given that he does not ambulate (therefore no need for prosthesis), he would be better served with an above-knee amputation. Dr. Scot Dock to see patient.   Virgina Jock, PA-C [facility] Office: [phone] Pager: [phone]

## 2014-12-22 NOTE — Progress Notes (Deleted)
12/22/2014 3:55 PM  [name] [name] to be D/C'd Home per MD order.  Discussed prescriptions and follow up appointments with the patient. Prescriptions given to patient, medication list explained in detail. Pt verbalized understanding.    Medication List    ASK your doctor about these medications        acetaminophen 325 MG tablet  Commonly known as:  TYLENOL  Take 650 mg by mouth every 6 (six) hours as needed for moderate pain.     aspirin EC 325 MG tablet  Take 325 mg by mouth daily.     atorvastatin 20 MG tablet  Commonly known as:  LIPITOR  Take 20 mg by mouth at bedtime.     carbidopa-levodopa 25-100 MG per tablet  Commonly known as:  SINEMET IR  Take 1 tablet by mouth 3 (three) times daily. Give 30 minutes before meals     cinacalcet 30 MG tablet  Commonly known as:  SENSIPAR  Take 1 tablet (30 mg total) by mouth daily after supper.     darbepoetin 60 MCG/0.3ML Soln injection  Commonly known as:  ARANESP  Inject 0.3 mLs (60 mcg total) into the vein every Thursday with hemodialysis.     DECUBI-VITE Caps  Take 1 capsule by mouth daily.     divalproex 500 MG 24 hr tablet  Commonly known as:  DEPAKOTE ER  Take 3 tablets (1,500 mg total) by mouth daily.     escitalopram 5 MG tablet  Commonly known as:  LEXAPRO  Take 1 tablet (5 mg total) by mouth daily.     gabapentin 300 MG capsule  Commonly known as:  NEURONTIN  Take 1 capsule (300 mg total) by mouth at bedtime.     Menthol (Topical Analgesic) 4 % Gel  Apply 1 application topically 4 (four) times daily as needed (neck pain).     metoprolol succinate 25 MG 24 hr tablet  Commonly known as:  TOPROL-XL  Take 12.5 mg by mouth at bedtime.     NEPHROCAPS PO  Take 1 capsule by mouth daily at 6 PM.     nitroGLYCERIN 0.4 MG SL tablet  Commonly known as:  NITROSTAT  Place 1 tablet (0.4 mg total) under the tongue every 5 (five) minutes x 3 doses as needed for chest pain.     oxyCODONE-acetaminophen 5-325 MG per  tablet  Commonly known as:  PERCOCET/ROXICET  Take 1 tablet by mouth every 6 (six) hours as needed for severe pain.     simethicone 80 MG chewable tablet  Commonly known as:  MYLICON  Chew 80 mg by mouth 2 (two) times daily as needed for flatulence.     Vitamin D3 1000 UNITS Caps  Take 1,000 Units by mouth daily.        Filed Vitals:   12/22/14 1525  BP:   Pulse:   Temp: 98.9 F (37.2 C)  Resp:     Skin clean, dry and intact without evidence of skin break down, no evidence of skin tears noted. IV catheter discontinued intact. Site without signs and symptoms of complications. Dressing and pressure applied. Pt denies pain at this time. No complaints noted.  An After Visit Summary was printed and given to the patient. Patient escorted via [place], and D/C home via private auto.  HILL, [name] 12/22/2014 3:55 PM

## 2014-12-22 NOTE — Progress Notes (Signed)
Patient admitted after midnight- please see H&P. Sepsis from right foot cellulitis and sacral decubitus - at this time patient has been placed on vancomycin and cefepime. At this time I have consulted wound team.  Follow blood cultures.  Has seen vascular in the past for the same foot- consulted Dr. Scot Dock (would be happy to call ortho if needed)  Acute encephalopathy - most likely secondary to sepsis.  -appears to be back at baseline - Given the history of seizures will check EEG and valproic levels and also ammonia levels.  ESRD on hemodialysis on Tuesday Thursday and Saturday - consulted nephrology for dialysis.  Parkinson's disease - recently patient's levodopa dose was increased. Continue.  History of seizures - on valproate continue.  Diabetes mellitus type 2 uncontrolled - patient was not on any medications for diabetes. Check hemoglobin A1c. Patient has been placed on sliding-scale coverage.  Chronic anemia from ESRD - follow CBC- will not take blood products  Eulogio Bear DO

## 2014-12-22 NOTE — Procedures (Signed)
ELECTROENCEPHALOGRAM REPORT  Date of Study: 12/22/2014  Patient's Name: [name] [name] MRN: [id number] Date of Birth: 12/03/1940  Referring Provider: Dr. Gean Birchwood  Clinical History: This is a 74 year old man with a history of seizure disorder, right craniectomy / craniotomy for excision of brain tumorin 2009 with increasing confusion.  Medications: aspirin EC tablet 325 mg atorvastatin (LIPITOR) tablet 20 mg carbidopa-levodopa (SINEMET IR) 25-100 MG per tablet immediate release 1 tablet ceFEPIme (MAXIPIME) 2 g in dextrose 5 % 50 mL IVPB cholecalciferol (VITAMIN D) tablet 1,000 Units cinacalcet (SENSIPAR) tablet 30 mg divalproex (DEPAKOTE ER) 24 hr tablet 1,500 mg enoxaparin (LOVENOX) injection 30 mg escitalopram (LEXAPRO) tablet 5 mg gabapentin (NEURONTIN) capsule 300 mg insulin aspart (novoLOG) injection 0-9 Units metoprolol succinate (TOPROL-XL) 24 hr tablet 12.5 mg multivitamin-lutein (OCUVITE-LUTEIN) capsule 1 capsule nitroGLYCERIN (NITROSTAT) SL tablet 0.4 mg senna-docusate (Senokot-S) tablet 1 tablet simethicone (MYLICON) chewable tablet 80 mg sodium chloride 0.9 % injection 3 mL vancomycin (VANCOCIN) IVPB 1000 mg/200 mL premix  Technical Summary: A multichannel digital EEG recording measured by the international 10-20 system with electrodes applied with paste and impedances below 5000 ohms performed as portable with EKG monitoring in lethargic patient.  Hyperventilation and photic stimulation were not performed.  The digital EEG was referentially recorded, reformatted, and digitally filtered in a variety of bipolar and referential montages for optimal display.   Description: The patient is predominantly drowsy and asleep during the recording. There is no clear posterior dominant rhythm. The background consists of a large amount of diffuse 4-5 Hz theta and 2-3 Hz delta slowing with vertex waves and poorly formed sleep spindles seen. Patient briefly arouses with  slight increase in faster frequencies. Hyperventilation and photic stimulation were not performed.  There were no epileptiform discharges or electrographic seizures seen.    EKG lead showed irregular rhythm.  Impression: This predominantly drowsy and asleep EEG is abnormal due to moderate diffuse slowing of the background.  Clinical Correlation of the above findings indicates diffuse cerebral dysfunction that is non-specific in etiology and can be seen with hypoxic/ischemic injury, toxic/metabolic encephalopathies, medication effect, or excessive drowsiness..  The absence of epileptiform discharges does not rule out a clinical diagnosis of epilepsy.  Clinical correlation is advised.   Ellouise Newer, M.D.

## 2014-12-22 NOTE — Consult Note (Signed)
WOC wound consult note Reason for Consult: evaluation of right heel and gluteal area. Pt incontinent of stool, he has indwelling FC.  He is not ambulatory per nursing staff. From SNF, per nursing staff sits in wheel chair all day.  Pt answers questions but it is not clear if these are standard answers from patient, he only really said yes or no to me and was very flat in his answers Wound type:  Pressure ulcers right heel: Unstageable and Stage II-bulla just proximal to the unstageable heel Pressure ulcer with associated MASD (moisture associated skin damage) sacral/upper left and right buttock area-left buttock sDTI; right buttock Stage II Pressure Ulcer POA: Yes x4 Measurement: Right heel: 6cm x 7cm x 0.1cm  Right achilles: 1.5cm x 4.5cm x 0 Right buttock: 4cm x 1cm x 0.1cm Left buttock: 6cm x 2cm x 0.1cm   Wound bed: Right heel: 100% eschar with some loosening tissue proximal, not fluctuant but skin is moist proximally. Right buttock: dark tissue but not eschar; macerated; purple Left buttock: purple, non blanchable, macerated center Drainage (amount, consistency, odor) minimal from the buttocks, no odor.  The right heel has some slight drainage, but there is odor, not able to express drainage with palplation Periwound: intact  Dressing procedure/placement/frequency: Add calcium alginate under the foam on the right heel to attempt to dry this area up and stabilize, however plain film do show gas in the SQ tissue would suggest ortho consult.  Prevalon boot for offloading the heel. Air mattress ordered by bedside nursing staff at the time of admission per skin care order set. Foam to the buttocks to protect and insulate, absorb exudate.  Turn from side to side.  [place] team will follow along with you for weekly wound assessments.  Please notify me of any acute changes in the wounds or any new areas of concerns Para March RN,CWOCN [phone]

## 2014-12-22 NOTE — H&P (Signed)
[facility] History and Physical  Matthias Bogus FAO:[id number] DOB: 23-Nov-1940 DOA: 12/21/2014  Referring physician: ER physician. PCP: Donetta Potts, MD  Chief Complaint: Confusion.  History obtained from patient's wife.  HPI: [name] [name] is a 74 y.o. male with no history of ESRD on hemodialysis, seizure disorder, tremor/Parkinson's disease, diabetes mellitus type 2, chronic anemia, hypertension, hyperlipidemia who was brought from the nursing home after patient was found to be having increasing confusion. As per patient and wife patient was started on antibiotics for possible pneumonia 3 days ago. Over the last few days patient was found to be having increasing confusion and also was found to have some slurred speech. Slurred speech is not new as patient was having it for last few weeks as per the patient. Patient was found to be hypotensive and lactic acid was found to be mildly elevated. Patient on exam was found to be confused off and on. CT of the head did not show anything acute. Patient's right lower extremity wound has been found by increasing discharge and with eschar. Patient also has a decubitus ulcer on the coccygeal area. Patient's Parkinson's disease was recently increased and patient is on valproate for seizures. Patient has been admitted for sepsis most likely from cellulitis of the right lower extremity.   Review of Systems: As presented in the history of presenting illness, rest negative.  Past Medical History  Diagnosis Date  . ESRD on hemodialysis     Started HD around 2009, maybe longer.  Was living in [place], [place] then.  Moved to [place] in 2014 and gets HD now at [facility] on [address] on a TTS schedule.  ESRD was due to DM and HTN.     Marland Kitchen Peripheral vascular disease   . Hypertension   . Hyperlipidemia   . Arthritis     Gout  . Paroxysmal atrial fibrillation   . Brain tumor 2009  . Seizure disorder 2009  . Anemia   . Hyperparathyroidism,  secondary   . Diabetes mellitus type 2 with complications     Type II, diet controlled  . Proteus mirabilis infection   . CHF (congestive heart failure)     grade 1 DD, preserved EF, [facility] records  . 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 brain tumor  . GERD (gastroesophageal reflux disease)   . Hx of cardiac cath     a. LHC (9/14):  Normal cors, EF 65%.    Past Surgical History  Procedure Laterality Date  . Av fistula placement Left     arm  . 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 Eye surgery Bilateral 2000    cataract  . Retinopathy surgery      Hx. of laser treatments for diabetics  . Craniectomy / craniotomy for excision of brain tumor  2009  . Fistulogram Left 05/28/2014    Procedure: FISTULOGRAM (ARM);  Surgeon: Conrad Weatogue, MD;  Location: [place];  Service: Vascular;  Laterality: Left;  Angioplasty of left sephalic vein times two.  . Revision of arteriovenous goretex graft Left 05/28/2014    Procedure: POSSIBLE REVISION OF FISTULA;  Surgeon: Conrad Blackgum, MD;  Location: [place];  Service: Vascular;  Laterality: Left;  Revision of Left arm fistula.  Darden Dates without cardioversion N/A 05/29/2014    Procedure: TRANSESOPHAGEAL ECHOCARDIOGRAM (TEE);  Surgeon: Fay Records, MD;  Location: [facility] ENDOSCOPY;  Service: Cardiovascular;  Laterality: N/A;  . Radiology with anesthesia Left 05/31/2014    Procedure: MRI WITH ANESTHESIA, Left hip;  Surgeon: Medication Radiologist, MD;  Location: [place];  Service: Radiology;  Laterality: Left;  Dr Jacinta Shoe ordered  . Left heart catheterization with coronary angiogram Bilateral 05/27/2013    Procedure: LEFT HEART CATHETERIZATION WITH CORONARY ANGIOGRAM;  Surgeon: Burnell Blanks, MD;  Location: [facility] CATH LAB;  Service: Cardiovascular;  Laterality: Bilateral;  . Shuntogram Left 12/04/2014    Procedure: FISTULOGRAM;  Surgeon: Conrad Olanta, MD;  Location: [facility]  CATH LAB;  Service: Cardiovascular;  Laterality: Left;   Social History:  reports that he has never smoked. He has never used smokeless tobacco. He reports that he does not drink alcohol or use illicit drugs. Where does patient live nursing home. Can patient participate in ADLs? No.  No Known Allergies  Family History:  Family History  Problem Relation Age of Onset  . Seizures Mother   . Stroke Mother 57  . Hypertension Mother   . Alcohol abuse Father   . Hypertension Father   . Arthritis Sister   . Heart attack Neg Hx   . Heart failure Neg Hx       Prior to Admission medications   Medication Sig Start Date End Date Taking? Authorizing Provider  acetaminophen (TYLENOL) 325 MG tablet Take 650 mg by mouth every 6 (six) hours as needed for moderate pain.   Yes Historical Provider, MD  atorvastatin (LIPITOR) 20 MG tablet Take 20 mg by mouth at bedtime.    Yes Historical Provider, MD  B Complex-C-Folic Acid (NEPHROCAPS PO) Take 1 capsule by mouth daily at 6 PM.   Yes Historical Provider, MD  carbidopa-levodopa (SINEMET IR) 25-100 MG per tablet Take 1 tablet by mouth 3 (three) times daily. Give 30 minutes before meals 12/06/14  Yes Penni Bombard, MD  Cholecalciferol (VITAMIN D3) 1000 UNITS CAPS Take 1,000 Units by mouth daily.    Yes Historical Provider, MD  cinacalcet (SENSIPAR) 30 MG tablet Take 1 tablet (30 mg total) by mouth daily after supper. 06/01/14  Yes Kinnie Feil, MD  divalproex (DEPAKOTE ER) 500 MG 24 hr tablet Take 3 tablets (1,500 mg total) by mouth daily. 12/06/14  Yes Penni Bombard, MD  escitalopram (LEXAPRO) 5 MG tablet Take 1 tablet (5 mg total) by mouth daily. 06/01/14  Yes Kinnie Feil, MD  gabapentin (NEURONTIN) 300 MG capsule Take 1 capsule (300 mg total) by mouth at bedtime. 06/01/14  Yes Kinnie Feil, MD  Menthol, Topical Analgesic, 4 % GEL Apply 1 application topically 4 (four) times daily as needed (neck pain).   Yes Historical Provider, MD   metoprolol succinate (TOPROL-XL) 25 MG 24 hr tablet Take 12.5 mg by mouth at bedtime.   Yes Historical Provider, MD  Multiple Vitamins-Minerals (DECUBI-VITE) CAPS Take 1 capsule by mouth daily.   Yes Historical Provider, MD  nitroGLYCERIN (NITROSTAT) 0.4 MG SL tablet Place 1 tablet (0.4 mg total) under the tongue every 5 (five) minutes x 3 doses as needed for chest pain. 05/28/13  Yes Nita Sells, MD  simethicone (MYLICON) 80 MG chewable tablet Chew 80 mg by mouth 2 (two) times daily as needed for flatulence.   Yes Historical Provider, MD  aspirin EC 325 MG tablet Take 325 mg by mouth daily.    Historical Provider, MD  darbepoetin (ARANESP) 60 MCG/0.3ML SOLN injection Inject 0.3 mLs (60 mcg total) into the vein every Thursday with hemodialysis.  06/01/14   Kinnie Feil, MD  oxyCODONE-acetaminophen (PERCOCET/ROXICET) 5-325 MG per tablet Take 1 tablet by mouth every 6 (six) hours as needed for severe pain. 06/01/14   Kinnie Feil, MD    Physical Exam: Filed Vitals:   12/21/14 2200 12/21/14 2215 12/21/14 2241 12/21/14 2303  BP: 104/52 106/54 85/54 110/60  Pulse: 79 83 82 83  Temp:    98.5 F (36.9 C)  TempSrc:    Oral  Resp: 13 11 17 18   Height:    6\' 4"  (1.93 m)  Weight:    118.434 kg (261 lb 1.6 oz)  SpO2:  96% 98% 95%     General:  Well-developed and nourished.  Eyes: Anicteric no pallor.  ENT: No discharge from the ears eyes nose and mouth.  Neck: No mass felt.  Cardiovascular: S1 and S2 heard.  Respiratory: No rhonchi or crepitations.  Abdomen: Soft nontender bowel sounds present.  Skin: Right foot has an eschar on the foot area with discharge. There is also decubitus ulcer on the sacral area.  Musculoskeletal: Right foot ulcer as described in the skin section. Left BKA.  Psychiatric: Patient is mildly confused.  Neurologic: Alert awake oriented to his name only. Moves all extremities.  Labs on Admission:  Basic Metabolic Panel:  Recent Labs Lab  12/21/14 1350  NA 134*  K 3.9  CL 92*  CO2 26  GLUCOSE 334*  BUN 41*  CREATININE 8.59*  CALCIUM 9.1   Liver Function Tests: No results for input(s): AST, ALT, ALKPHOS, BILITOT, PROT, ALBUMIN in the last 168 hours. No results for input(s): LIPASE, AMYLASE in the last 168 hours. No results for input(s): AMMONIA in the last 168 hours. CBC:  Recent Labs Lab 12/21/14 1350  WBC 15.9*  HGB 11.2*  HCT 34.0*  MCV 102.4*  PLT 354   Cardiac Enzymes: No results for input(s): CKTOTAL, CKMB, CKMBINDEX, TROPONINI in the last 168 hours.  BNP (last 3 results) No results for input(s): BNP in the last 8760 hours.  ProBNP (last 3 results)  Recent Labs  01/15/14 1316  PROBNP 1399.0*    CBG:  Recent Labs Lab 12/15/14 0854 12/21/14 1336 12/21/14 1843  GLUCAP 283* 294* 302*    Radiological Exams on Admission: Ct Head Wo Contrast  12/21/2014   CLINICAL DATA:  Hypotension following dialysis, increased weakness  EXAM: CT HEAD WITHOUT CONTRAST  TECHNIQUE: Contiguous axial images were obtained from the base of the skull through the vertex without intravenous contrast.  COMPARISON:  05/19/2014  FINDINGS: Mild atrophic changes are noted. Encephalomalacia changes are noted in the right temporal lobe related to prior surgery. Calvarial defects are noted on the right related to the prior surgery as well. Mild chronic white matter ischemic change is seen. No findings to suggest acute hemorrhage, acute infarction or space-occupying mass lesion are noted. Some motion artifact is noted on some of the images.  IMPRESSION: Chronic atrophic and ischemic changes.  Postoperative encephalomalacia on the right as described.  No acute abnormality is seen.   Electronically Signed   By: Inez Catalina M.D.   On: 12/21/2014 18:46   Dg Chest Portable 1 View  12/21/2014   CLINICAL DATA:  Hypotension, history hypertension, diabetes, CHF, brain tumor, end-stage renal disease  EXAM: PORTABLE CHEST - 1 VIEW  COMPARISON:   Portable exam 1723 hours compared to 05/23/2014  FINDINGS: Borderline enlargement of cardiac silhouette.  Mediastinal contours and pulmonary vascularity normal.  Low lung volumes with bibasilar atelectasis.  No  definite infiltrate, pleural effusion or pneumothorax.  IMPRESSION: Low lung volumes with bibasilar atelectasis.   Electronically Signed   By: Lavonia Dana M.D.   On: 12/21/2014 17:37   Dg Foot 2 Views Right  12/21/2014   CLINICAL DATA:  Large open sore to the right heel. Diabetes. Cellulitis.  EXAM: RIGHT FOOT - 2 VIEW  COMPARISON:  06/14/2013  FINDINGS: Subcutaneous soft tissue emphysema over the plantar aspect of the posterior right calcaneus consistent with history of ulceration. No bone erosion is identified to suggest evidence of osteomyelitis. Diffuse soft tissue edema. Prominent diffuse vascular calcification. Degenerative changes in the interphalangeal joints, first metatarsal-phalangeal joint, and intertarsal joints. Diffuse bone demineralization. No acute fracture or dislocation.  IMPRESSION: Diffuse soft tissue edema. Subcutaneous gas over the posterior calcaneus consistent with history of cellulitis and ulceration. No bone changes to suggest osteomyelitis.   Electronically Signed   By: Lucienne Capers M.D.   On: 12/21/2014 21:04    EKG: Independently reviewed. Normal sinus rhythm.  Assessment/Plan Principal Problem:   Sepsis Active Problems:   ESRD (end stage renal disease) on dialysis   Seizure disorder   Parkinson's disease   Anemia in chronic kidney disease   Cellulitis of right foot   Decubitus ulcer   Diabetes mellitus type 2, uncontrolled   1. Sepsis from right foot cellulitis and sacral decubitus - at this time patient has been placed on vancomycin and cefepime. At this time I have consulted wound team. Patient probably would need orthopedic consult. Follow blood cultures. 2. Acute encephalopathy - most likely secondary to sepsis. Since patient also has some slurred  speech I have ordered MRI brain. Given the history of seizures will check EEG and valproic levels and also ammonia levels. 3. ESRD on hemodialysis on Tuesday Thursday and Saturday - consult nephrology for dialysis. 4. Parkinson's disease - recently patient's levodopa dose was increased. Continue. 5. History of seizures - on valproate continue. Check blood rate levels. 6. Diabetes mellitus type 2 uncontrolled - patient was not on any medications for diabetes. Check hemoglobin A1c. Patient has been placed on sliding-scale coverage. 7. Chronic anemia from ESRD - follow CBC.  Patient is a Sales promotion account executive [employer] and will not accept any blood transfusion.   DVT Prophylaxis Lovenox.  Code Status: Full code.  Family Communication: Patient's wife at the bedside.  Disposition Plan: Admit to inpatient.    Blair Mesina N. [facility] Pager [phone].  If 7PM-7AM, please contact night-coverage [email or web address] Password [facility] 12/22/2014, 12:37 AM

## 2014-12-22 NOTE — Consult Note (Signed)
[facility] [place]    Reason for Consult: infected right heel wound Referring Physician: Dr. Eliseo Squires MRN #: [id number]  History of Present Illness: This is a 74 y.o. male who we've been consulted regarding a right heel wound. The history is limited by the patient's AMS. He presented to the [facility] ED on 12/21/14 with AMS and hypotensive. Following dialysis yesterday, the patient became hypotensive and was sent to the ED. He resides in a skilled nursing facility. He was last seen in the [facility] office on 11/22/14 by Dr. Bridgett Larsson for fistula check and right heel wound check. He presented previously on 09/22/14 with new plantar ulcer on his right heel. He was receiving wound care at [facility]. At the last visit, his heel ulcer was clean with granulation tissue present. The patient is wheelchair bound and has a history of right BKA. With his comorbidities and non-ambulatory status, Dr. Bridgett Larsson felt that revascularization would be of no benefit.   He has a past medical history of ESRD on HD, CHF, paroxymal atrial fibrillation, diabetes mellitus II, seizure disorder, history of brain tumor, hypertension and hyperlipidemia.   Past Medical History  Diagnosis Date  . ESRD on hemodialysis     Started HD around 2009, maybe longer. Was living in [place], [place] then. Moved to [place] in 2014 and gets HD now at [facility] on [address] on a TTS schedule. ESRD was due to DM and HTN.   Marland Kitchen Peripheral vascular disease   . Hypertension   . Hyperlipidemia   . Arthritis     Gout  . Paroxysmal atrial fibrillation   . Brain tumor 2009  . Seizure disorder 2009  . Anemia   . Hyperparathyroidism, secondary   . Diabetes mellitus type 2 with complications     Type II, diet controlled  . Proteus mirabilis infection   . CHF (congestive heart failure)     grade 1 DD, preserved EF, [facility] records  . Mixed oligoastrocytoma 2009    with  resection. 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 brain tumor  . GERD (gastroesophageal reflux disease)   . Hx of cardiac cath     a. LHC (9/14): Normal cors, EF 65%.    Past Surgical History  Procedure Laterality Date  . Av fistula placement Left     arm  . 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 Eye surgery Bilateral 2000    cataract  . Retinopathy surgery      Hx. of laser treatments for diabetics  . Craniectomy / craniotomy for excision of brain tumor  2009  . Fistulogram Left 05/28/2014    Procedure: FISTULOGRAM (ARM); Surgeon: Conrad [place], MD; Location: [place]; Service: Vascular; Laterality: Left; Angioplasty of left sephalic vein times two.  . Revision of arteriovenous goretex graft Left 05/28/2014    Procedure: POSSIBLE REVISION OF FISTULA; Surgeon: Conrad [place], MD; Location: [place]; Service: Vascular; Laterality: Left; Revision of Left arm fistula.  Darden Dates without cardioversion N/A 05/29/2014    Procedure: TRANSESOPHAGEAL ECHOCARDIOGRAM (TEE); Surgeon: Fay Records, MD; Location: [facility] ENDOSCOPY; Service: Cardiovascular; Laterality: N/A;  . Radiology with anesthesia Left 05/31/2014    Procedure: MRI WITH ANESTHESIA, Left hip; Surgeon: Medication Radiologist, MD; Location: [place]; Service: Radiology; Laterality: Left; Dr Jacinta Shoe ordered  . Left heart catheterization with coronary angiogram Bilateral 05/27/2013    Procedure: LEFT HEART CATHETERIZATION WITH CORONARY ANGIOGRAM; Surgeon: Annita Brod  Angelena Form, MD; Location: [place] CATH LAB; Service: Cardiovascular; Laterality: Bilateral;  . Shuntogram Left 12/04/2014    Procedure: FISTULOGRAM; Surgeon: Conrad Hudson, MD; Location: [facility] CATH LAB; Service: Cardiovascular; Laterality: Left;    No Known Allergies  Prior to Admission medications   Medication Sig Start Date End  Date Taking? Authorizing Provider  acetaminophen (TYLENOL) 325 MG tablet Take 650 mg by mouth every 6 (six) hours as needed for moderate pain.   Yes Historical Provider, MD  atorvastatin (LIPITOR) 20 MG tablet Take 20 mg by mouth at bedtime.    Yes Historical Provider, MD  B Complex-C-Folic Acid (NEPHROCAPS PO) Take 1 capsule by mouth daily at 6 PM.   Yes Historical Provider, MD  carbidopa-levodopa (SINEMET IR) 25-100 MG per tablet Take 1 tablet by mouth 3 (three) times daily. Give 30 minutes before meals 12/06/14  Yes Penni Bombard, MD  Cholecalciferol (VITAMIN D3) 1000 UNITS CAPS Take 1,000 Units by mouth daily.    Yes Historical Provider, MD  cinacalcet (SENSIPAR) 30 MG tablet Take 1 tablet (30 mg total) by mouth daily after supper. 06/01/14  Yes Kinnie Feil, MD  divalproex (DEPAKOTE ER) 500 MG 24 hr tablet Take 3 tablets (1,500 mg total) by mouth daily. 12/06/14  Yes Penni Bombard, MD  escitalopram (LEXAPRO) 5 MG tablet Take 1 tablet (5 mg total) by mouth daily. 06/01/14  Yes Kinnie Feil, MD  gabapentin (NEURONTIN) 300 MG capsule Take 1 capsule (300 mg total) by mouth at bedtime. 06/01/14  Yes Kinnie Feil, MD  Menthol, Topical Analgesic, 4 % GEL Apply 1 application topically 4 (four) times daily as needed (neck pain).   Yes Historical Provider, MD  metoprolol succinate (TOPROL-XL) 25 MG 24 hr tablet Take 12.5 mg by mouth at bedtime.   Yes Historical Provider, MD  Multiple Vitamins-Minerals (DECUBI-VITE) CAPS Take 1 capsule by mouth daily.   Yes Historical Provider, MD  nitroGLYCERIN (NITROSTAT) 0.4 MG SL tablet Place 1 tablet (0.4 mg total) under the tongue every 5 (five) minutes x 3 doses as needed for chest pain. 05/28/13  Yes Nita Sells, MD  simethicone (MYLICON) 80 MG chewable tablet Chew 80 mg by mouth 2 (two) times daily as needed for flatulence.   Yes Historical Provider, MD    aspirin EC 325 MG tablet Take 325 mg by mouth daily.    Historical Provider, MD  darbepoetin (ARANESP) 60 MCG/0.3ML SOLN injection Inject 0.3 mLs (60 mcg total) into the vein every Thursday with hemodialysis. 06/01/14   Kinnie Feil, MD  oxyCODONE-acetaminophen (PERCOCET/ROXICET) 5-325 MG per tablet Take 1 tablet by mouth every 6 (six) hours as needed for severe pain. 06/01/14   Kinnie Feil, MD    History   Social History  . Marital Status: Married    Spouse Name: [name]  . Number of Children: 3  . Years of Education: 12th   Occupational History  .      N/A   Social History Main Topics  . Smoking status: Never Smoker   . Smokeless tobacco: Never Used  . Alcohol Use: No  . Drug Use: No  . Sexual Activity: No   Other Topics Concern  . Not on file   Social History Narrative   Pt lives at [facility]    Caffeine Use: 1-2 cups daily.   Uses wheelchair for ambulation and prosthetic limb Left leg.     Family History  Problem Relation Age of Onset  . Seizures  Mother   . Stroke Mother 54  . Hypertension Mother   . Alcohol abuse Father   . Hypertension Father   . Arthritis Sister   . Heart attack Neg Hx   . Heart failure Neg Hx     ROS: [x]  Positive [ ]  Negative [ ]  All sytems reviewed and are negative ROS unable to be obtained due to AMS  Cardiovascular: []  chest pain/pressure []  palpitations []  SOB lying flat []  DOE []  pain in legs while walking []  pain in legs at rest []  pain in legs at night []  non-healing ulcers []  hx of DVT []  swelling in legs  Pulmonary: []  productive cough []  asthma/wheezing []  home O2  Neurologic: []  weakness in []  arms []  legs []  numbness in []  arms []  legs []  hx of CVA []  mini stroke [] difficulty speaking or slurred speech []  temporary loss of vision in one eye []   dizziness  Hematologic: []  hx of cancer []  bleeding problems []  problems with blood clotting easily  Endocrine:  []  diabetes []  thyroid disease  GI []  vomiting blood []  blood in stool  GU: []  CKD/renal failure []  HD--[]  M/W/F or []  T/T/S []  burning with urination []  blood in urine  Psychiatric: []  anxiety []  depression  Musculoskeletal: []  arthritis []  joint pain  Integumentary: []  rashes []  ulcers  Constitutional: []  fever []  chills   Physical Examination  Filed Vitals:   12/22/14 0900  BP: 102/58  Pulse: 74  Temp: 99 F (37.2 C)  Resp: 20   Body mass index is 31.8 kg/(m^2).  General: Confused, resting in bed in NAD Gait: Not observed HENT: WNL, normocephalic Pulmonary: normal non-labored breathing, without Rales, rhonchi, wheezing Cardiac: regular, without Murmurs, rubs or gallops; without carotid bruits Abdomen: soft, NT/ND, no masses Vascular Exam/Pulses:  Right Left  Radial 2+ (normal) 2+ (normal)  Femoral 1+ (weak) 1+ (weak)  Popliteal Non palpable Non palpable  DP Non palpable, no doppler available BKA  PT Non palpable, no doppler available BKA   Extremities: with large necrotic heel wound with purulent drainage and foul odor. Left BKA.  Musculoskeletal: no muscle wasting or atrophy Neurologic: Is confused. Oriented to person and time. Unsure of place.    CBC  Labs (Brief)       Component Value Date/Time   WBC 14.0* 12/22/2014 0320   WBC 8.0 02/09/2013 1350   RBC 3.07* 12/22/2014 0320   RBC 3.43* 02/09/2013 1350   HGB 10.2* 12/22/2014 0320   HCT 31.5* 12/22/2014 0320   PLT 346 12/22/2014 0320   MCV 102.6* 12/22/2014 0320   MCH 33.2 12/22/2014 0320   MCH 31.8 02/09/2013 1350   MCHC 32.4 12/22/2014 0320   MCHC 34.3 02/09/2013 1350   RDW 14.7 12/22/2014 0320   RDW 13.7 02/09/2013 1350   LYMPHSABS 2.0 12/22/2014 0320    LYMPHSABS 2.5 02/09/2013 1350   MONOABS 2.2* 12/22/2014 0320   EOSABS 0.1 12/22/2014 0320   EOSABS 0.2 02/09/2013 1350   BASOSABS 0.0 12/22/2014 0320   BASOSABS 0.0 02/09/2013 1350      BMET  Labs (Brief)       Component Value Date/Time   NA 137 12/22/2014 0320   NA 138 02/09/2013 1350   K 4.0 12/22/2014 0320   CL 98 12/22/2014 0320   CO2 25 12/22/2014 0320   GLUCOSE 260* 12/22/2014 0320   GLUCOSE 137* 02/09/2013 1350   BUN 48* 12/22/2014 0320   BUN 25 02/09/2013 1350   CREATININE 9.79* 12/22/2014 0320  CALCIUM 9.1 12/22/2014 0320   GFRNONAA 5* 12/22/2014 0320   GFRAA 5* 12/22/2014 0320      COAGS:  Recent Labs    Lab Results  Component Value Date   INR 0.97 05/26/2013      Statin: Yes.  Beta Blocker: Yes.  Aspirin: Yes.  ACEI: No. ARB: No. Other antiplatelets/anticoagulants: No.   ASSESSMENT: This is a 74 y.o. male with right lower extremity critical limb ischemia. His right heel wound is necrotic with purulent drainage that has progressed since he was last seen on 11/22/14 by Dr. Bridgett Larsson. He has a history of left BKA.   Other active problems: ESRD on HD TTS, acute encephalopathy likely secondary to sepsis, history of seizures, DM 2, Parkinson's disease, known sacral decubitus ulcer.   PLAN: The patient's sepsis may be due to his infected right heel wound. This wound was found to be clean with granulation tissue when it was seen 4 weeks ago. Given that he is wheelchair bound and other co-morbidities, revascularization would be of limited benefit. Will need an amputation. A below-knee amputation may heal, but given that he does not ambulate (therefore no need for prosthesis), he would be better served with an above-knee amputation. Dr. Scot Dock to see patient.   Virgina Jock, PA-C [facility] Office: [phone] Pager: [phone]     Agree with  above. I would recommend primary amputation. Given his debilitated state, non-ambulatory status, and previous left below the knee amputation, I would recommend an above-the-knee amputation. I have discussed this with him and will discuss it with his family if they are available. We will follow and could proceed with amputation when/if he is agreeable.  Deitra Mayo, MD, [place] [phone] Office: [phone]

## 2014-12-22 NOTE — Progress Notes (Signed)
UR chart review completed this am.

## 2014-12-22 NOTE — Progress Notes (Signed)
12/22/2014  10:15 AM  Went to assess patient and found the handle from the air blower to his air mattress resting on his foot. Patient was unaware nor could he feel the handle on his right food. Once this RN removed the handle a deep indention was left. No open areas found. After 30 minutes of reassessing the patient the indention went away. Patient still remains with +3 pitting edema. Will continue to assess and monitor the patient.   [employer], RN-BC, RN3 [facility] 6 [employer] [phone]

## 2014-12-22 NOTE — Clinical Social Work Psychosocial (Addendum)
Clinical Social Work Department BRIEF PSYCHOSOCIAL ASSESSMENT 12/22/2014  Patient:  [name] [name],[name]     Account Number:  [id number]     Admit date:  12/21/2014  Clinical Social Worker:  Frederico Hamman  Date/Time:  12/22/2014 12:40 PM  Referred by:  Physician  Date Referred:  12/22/2014 Referred for  SNF Placement   Other Referral:   Interview type:  Other - See comment Other interview type:   Chart review    PSYCHOSOCIAL DATA Living Status:  FACILITY Admitted from facility:  [place] Level of care:  [place] Primary support name:  [name] Cupo Primary support relationship to patient:  SPOUSE Degree of support available:    CURRENT CONCERNS Current Concerns  Post-Acute Placement   Other Concerns:    SOCIAL WORK ASSESSMENT / PLAN Patient from [facility] and has been there since 06/01/14. CSW visited patient's room and he was asleep.  Attempted to reach patient's wife and message left to confirm return to facility.  CSW received call from [name] [name] [name] at 4:39 pm. She confirmed that patient will return to [facility] skilled facility once medically stable for discharge. [name] [name] [name] informed that CSW will assist with d/c back to facility.    Assessment/plan status:   Other assessment/ plan:   Information/referral to community resources:    PATIENT'S/FAMILY'S RESPONSE TO PLAN OF CARE: Mrs. Riddles appreciative of CSW's call and assistance with d/c back to facility.      [name] [name] [name], MSW, LCSW Licensed Clinical Social Worker [place] [phone]

## 2014-12-23 DIAGNOSIS — D631 Anemia in chronic kidney disease: Secondary | ICD-10-CM

## 2014-12-23 DIAGNOSIS — Z992 Dependence on renal dialysis: Secondary | ICD-10-CM

## 2014-12-23 DIAGNOSIS — N189 Chronic kidney disease, unspecified: Secondary | ICD-10-CM

## 2014-12-23 DIAGNOSIS — N186 End stage renal disease: Secondary | ICD-10-CM

## 2014-12-23 LAB — URINE CULTURE: Colony Count: >=100000

## 2014-12-23 LAB — HEMOGLOBIN A1C
HEMOGLOBIN A1C: 10.1 % — AB (ref 4.8–5.6)
Mean Plasma Glucose: 243 mg/dL

## 2014-12-23 LAB — GLUCOSE, CAPILLARY
GLUCOSE-CAPILLARY: 228 mg/dL — AB (ref 70–99)
Glucose-Capillary: 235 mg/dL — ABNORMAL HIGH (ref 70–99)
Glucose-Capillary: 122 mg/dL — ABNORMAL HIGH (ref 70–99)
Glucose-Capillary: 145 mg/dL — ABNORMAL HIGH (ref 70–99)

## 2014-12-23 MED ORDER — INSULIN ASPART 100 UNIT/ML ~~LOC~~ SOLN
3.0000 [IU] | Freq: Three times a day (TID) | SUBCUTANEOUS | Status: DC
  Administered 2014-12-23 – 2014-12-24 (×3): 3 [IU] via SUBCUTANEOUS

## 2014-12-23 MED ORDER — ACETAMINOPHEN 325 MG PO TABS
650.0000 mg | ORAL_TABLET | Freq: Four times a day (QID) | ORAL | Status: DC | PRN
  Administered 2014-12-23: 650 mg via ORAL
  Filled 2014-12-23: qty 2

## 2014-12-23 MED ORDER — VANCOMYCIN HCL IN DEXTROSE 1-5 GM/200ML-% IV SOLN
1000.0000 mg | INTRAVENOUS | Status: DC
  Administered 2014-12-23: 1000 mg via INTRAVENOUS
  Filled 2014-12-23: qty 200

## 2014-12-23 MED ORDER — INSULIN GLARGINE 100 UNIT/ML ~~LOC~~ SOLN
5.0000 [IU] | Freq: Every day | SUBCUTANEOUS | Status: DC
  Administered 2014-12-23 – 2014-12-25 (×3): 5 [IU] via SUBCUTANEOUS
  Filled 2014-12-23 (×3): qty 0.05

## 2014-12-23 NOTE — Progress Notes (Signed)
Pt daughter Ileene Hutchinson concerned about pt BP 103/54. She became anxious, agitated, and yelling out. Demanding that the patient be moved and signed out AMA. Jonette Eva NP notified. States, "I want him transferred to a different hospital because no one is doing anything for him." Had to page security for disruptive behavior. Ileene Hutchinson then apologizes to Engineer, civil (consulting).

## 2014-12-23 NOTE — Progress Notes (Signed)
Pt's daughter Arbie Cookey came to the nursing station yelling and pointing her finger at Otila Kluver, [place] her father's nurse and stated  " You are fired. You are not doing anything for him. I want to sign him out of here. Give me his papers". Pt's daughter was told that we would page the doctor and let her know that she wanted him moved to another facility.  MD was called made aware and stated to tell the daughter that she would not be able to sign him out only his wife would who is next of kin.  Pt's daughter returned to the room and could be heard yelling up the hall. Security was called to come help calm the daughter down. Pt's daughter than apologized to [place], Therapist, sports and stated that her daddy is her best friend and she is just very concerned about him.

## 2014-12-23 NOTE — Progress Notes (Signed)
Subjective:   More somnolent, able to arouse briefly, complains of right heel pain, wife and daughter present  Objective: Vital signs in last 24 hours: Temp:  [98.9 F (37.2 C)-101.5 F (38.6 C)] 99.5 F (37.5 C) (04/09 0352) Pulse Rate:  [74-90] 86 (04/09 0352) Resp:  [18-20] 18 (04/09 0352) BP: (102-127)/(54-70) 103/54 mmHg (04/09 0352) SpO2:  [94 %-98 %] 94 % (04/09 0352) Weight:  [116.5 kg (256 lb 13.4 oz)-118 kg (260 lb 2.3 oz)] 116.5 kg (256 lb 13.4 oz) (04/08 1814) Weight change: -2.203 kg (-4 lb 13.7 oz)  Intake/Output from previous day: 04/08 0701 - 04/09 0700 In: 3 [I.V.:3] Out: 1735  Intake/Output this shift:   Lab Results:  Recent Labs  12/22/14 0320 12/22/14 1530  WBC 14.0* 13.3*  HGB 10.2* 9.2*  HCT 31.5* 28.5*  PLT 346 336   BMET:  Recent Labs  12/22/14 0320 12/22/14 1530  NA 137 135  K 4.0 4.0  CL 98 97  CO2 25 27  GLUCOSE 260* 249*  BUN 48* 55*  CREATININE 9.79* 10.68*  CALCIUM 9.1 8.8  ALBUMIN  --  1.9*   No results for input(s): PTH in the last 72 hours. Iron Studies: No results for input(s): IRON, TIBC, TRANSFERRIN, FERRITIN in the last 72 hours.  Studies/Results: No results found.   EXAM: General Appearance:  Alert, in no apparent distress Resp:   CTA without rales, rhonchi, or wheezes Cardio:  RRR without murmur or rub GI:  + BS, soft and nontender Extremities:  No edema, malodorous right heel wound with protective boot Access:  AVF @ [facility] with + bruit  Dialysis Orders: TTS @ [place] 119 kg 4.5 hrs 400/800 2K/2Ca Profile 3 Heparin 6000 U AVF @ [facility] No Hectorol Aranesp 25 mcg on Thurs No Venofer  Assessment/Plan: 1. AMS - sec to worsening necrotic R heel wound, likely requiring amputation per evaluation by [facility], started Vancomycin & Cefepime.  2. ESRD - HD on TTS @ [place], 3-hr HD yesterday, K 4. HD pending again today. 3. HTN/volume - BP 103/54 on Metoprolol 12.5 mg qhs; wt 116.5 kg,  below EDW, CXR negative.  4. Anemia - Hgb down to 9.2 on outpatient Aranesp 25 mcg, to receive today. 5. Metabolic bone disease - Ca 8.8 (10.5 corrected), P 4.5; Cholecalciferol 1000 U qd, Sensipar 30 mg qd, no binders.  Hold Vitamin D. 6. Nutrition - Alb 1.9, renal diet, vitamin. 7. Seizure disorder - on Depakote. 8. Parkinson's - on Sinemet. 9. A-fib - on BB. 10. DM - per primary.    LOS: 2 days   [name] [name],[name] [name] 12/23/2014,7:45 AM  Pt seen, examined and agree w A/P as above. HD today.  Kelly Splinter MD pager [phone]    cell [phone] 12/23/2014, 11:52 AM

## 2014-12-23 NOTE — Progress Notes (Signed)
PROGRESS NOTE  [name] [name] OHY:[id number] DOB: [birth year] DOA: 12/21/2014 PCP: Donetta Potts, MD  Assessment/Plan: Sepsis from right foot cellulitis and sacral decubitus -  -vancomycin and cefepime. - Follow blood cultures. - consulted Dr. Scot Dock- family deciding about possible amputation  UTI -IV abx -await final culture  Acute encephalopathy - most likely secondary to sepsis.  -avoid sedating meds -valproic -ammonia level  ESRD on hemodialysis on Tuesday Thursday and Saturday - consulted nephrology for dialysis.  Parkinson's disease - recently patient's levodopa dose was increased. Continue.  History of seizures - on valproate continue.  Diabetes mellitus type 2 uncontrolled - patient was not on any medications for diabetes.  hemoglobin A1c 10. Patient has been placed on sliding-scale coverage- add lantus  Chronic anemia from ESRD - follow CBC- will not take blood products   Spoke with Dr. Glean Salvo with wife about code status and need to make a decision regarding this -overall poor prognosis  Code Status: full Family Communication: wife and daughter at bedside Disposition Plan:   Consultants:  vascular  Procedures:      HPI/Subjective: Sleepy- will awaken but unable to give meaningful information  Objective: Filed Vitals:   12/23/14 1011  BP: 95/53  Pulse: 83  Temp: 99.3 F (37.4 C)  Resp: 18    Intake/Output Summary (Last 24 hours) at 12/23/14 1021 Last data filed at 12/23/14 0900  Gross per 24 hour  Intake    240 ml  Output   1735 ml  Net  -1495 ml   Filed Weights   12/21/14 2303 12/22/14 1525 12/22/14 1814  Weight: 118.434 kg (261 lb 1.6 oz) 118 kg (260 lb 2.3 oz) 116.5 kg (256 lb 13.4 oz)    Exam:   General:  Sleepy but will awaken  Cardiovascular: rrr  Respiratory: diminished  Abdomen: +BS, soft  Musculoskeletal: foul smelling wound   Data Reviewed: Basic Metabolic Panel:  Recent Labs Lab 12/21/14 1350  12/22/14 0320 12/22/14 1530  NA 134* 137 135  K 3.9 4.0 4.0  CL 92* 98 97  CO2 26 25 27   GLUCOSE 334* 260* 249*  BUN 41* 48* 55*  CREATININE 8.59* 9.79* 10.68*  CALCIUM 9.1 9.1 8.8  PHOS  --   --  4.5   Liver Function Tests:  Recent Labs Lab 12/22/14 1530  ALBUMIN 1.9*   No results for input(s): LIPASE, AMYLASE in the last 168 hours. No results for input(s): AMMONIA in the last 168 hours. CBC:  Recent Labs Lab 12/21/14 1350 12/22/14 0320 12/22/14 1530  WBC 15.9* 14.0* 13.3*  NEUTROABS  --  9.7*  --   HGB 11.2* 10.2* 9.2*  HCT 34.0* 31.5* 28.5*  MCV 102.4* 102.6* 102.9*  PLT 354 346 336   Cardiac Enzymes: No results for input(s): CKTOTAL, CKMB, CKMBINDEX, TROPONINI in the last 168 hours. BNP (last 3 results) No results for input(s): BNP in the last 8760 hours.  ProBNP (last 3 results)  Recent Labs  01/15/14 1316  PROBNP 1399.0*    CBG:  Recent Labs Lab 12/21/14 1336 12/21/14 1843 12/21/14 2322 12/22/14 2137 12/23/14 0749  GLUCAP 294* 302* 280* 219* 228*    Recent Results (from the past 240 hour(s))  Urine culture     Status: None (Preliminary result)   Collection Time: 12/21/14  4:34 PM  Result Value Ref Range Status   Specimen Description URINE, CATHETERIZED  Final   Special Requests Immunocompromised  Final   Colony Count   Final    >=100,000 COLONIES/ML  Performed at [facility]    Culture   Final    [place]raham Performed at [facility]    Report Status PENDING  Incomplete  Blood culture (routine x 2)     Status: None (Preliminary result)   Collection Time: 12/21/14  4:57 PM  Result Value Ref Range Status   Specimen Description BLOOD RIGHT ANTECUBITAL  Final   Special Requests BOTTLES DRAWN AEROBIC ONLY 2CC  Final   Culture   Final           BLOOD CULTURE RECEIVED NO GROWTH TO DATE CULTURE WILL BE HELD FOR 5 DAYS BEFORE ISSUING A FINAL NEGATIVE REPORT Performed at [facility]    Report Status  PENDING  Incomplete  Blood culture (routine x 2)     Status: None (Preliminary result)   Collection Time: 12/21/14  5:09 PM  Result Value Ref Range Status   Specimen Description BLOOD RIGHT HAND  Final   Special Requests BOTTLES DRAWN AEROBIC AND ANAEROBIC 5CC  Final   Culture   Final           BLOOD CULTURE RECEIVED NO GROWTH TO DATE CULTURE WILL BE HELD FOR 5 DAYS BEFORE ISSUING A FINAL NEGATIVE REPORT Performed at [facility]    Report Status PENDING  Incomplete  MRSA PCR Screening     Status: None   Collection Time: 12/22/14 12:32 AM  Result Value Ref Range Status   MRSA by PCR NEGATIVE NEGATIVE Final    Comment:        The GeneXpert MRSA Assay (FDA approved for NASAL specimens only), is one component of a comprehensive MRSA colonization surveillance program. It is not intended to diagnose MRSA infection nor to guide or monitor treatment for MRSA infections.      Studies: Ct Head Wo Contrast  12/21/2014   CLINICAL DATA:  Hypotension following dialysis, increased weakness  EXAM: CT HEAD WITHOUT CONTRAST  TECHNIQUE: Contiguous axial images were obtained from the base of the skull through the vertex without intravenous contrast.  COMPARISON:  05/19/2014  FINDINGS: Mild atrophic changes are noted. Encephalomalacia changes are noted in the right temporal lobe related to prior surgery. Calvarial defects are noted on the right related to the prior surgery as well. Mild chronic white matter ischemic change is seen. No findings to suggest acute hemorrhage, acute infarction or space-occupying mass lesion are noted. Some motion artifact is noted on some of the images.  IMPRESSION: Chronic atrophic and ischemic changes.  Postoperative encephalomalacia on the right as described.  No acute abnormality is seen.   Electronically Signed   By: Inez Catalina M.D.   On: 12/21/2014 18:46   Dg Chest Portable 1 View  12/21/2014   CLINICAL DATA:  Hypotension, history hypertension, diabetes, CHF,  brain tumor, end-stage renal disease  EXAM: PORTABLE CHEST - 1 VIEW  COMPARISON:  Portable exam 1723 hours compared to 05/23/2014  FINDINGS: Borderline enlargement of cardiac silhouette.  Mediastinal contours and pulmonary vascularity normal.  Low lung volumes with bibasilar atelectasis.  No definite infiltrate, pleural effusion or pneumothorax.  IMPRESSION: Low lung volumes with bibasilar atelectasis.   Electronically Signed   By: Lavonia Dana M.D.   On: 12/21/2014 17:37   Dg Foot 2 Views Right  12/21/2014   CLINICAL DATA:  Large open sore to the right heel. Diabetes. Cellulitis.  EXAM: RIGHT FOOT - 2 VIEW  COMPARISON:  06/14/2013  FINDINGS: Subcutaneous soft tissue emphysema over the plantar aspect of  the posterior right calcaneus consistent with history of ulceration. No bone erosion is identified to suggest evidence of osteomyelitis. Diffuse soft tissue edema. Prominent diffuse vascular calcification. Degenerative changes in the interphalangeal joints, first metatarsal-phalangeal joint, and intertarsal joints. Diffuse bone demineralization. No acute fracture or dislocation.  IMPRESSION: Diffuse soft tissue edema. Subcutaneous gas over the posterior calcaneus consistent with history of cellulitis and ulceration. No bone changes to suggest osteomyelitis.   Electronically Signed   By: Lucienne Capers M.D.   On: 12/21/2014 21:04    Scheduled Meds: . aspirin EC  325 mg Oral Daily  . atorvastatin  20 mg Oral QHS  . carbidopa-levodopa  1 tablet Oral TID AC  . ceFEPime (MAXIPIME) IV  2 g Intravenous Q T,Th,Sa-HD  . cholecalciferol  1,000 Units Oral Daily  . cinacalcet  30 mg Oral QPC supper  . divalproex  1,500 mg Oral Daily  . enoxaparin (LOVENOX) injection  30 mg Subcutaneous Q24H  . escitalopram  5 mg Oral Daily  . gabapentin  300 mg Oral QHS  . insulin aspart  0-9 Units Subcutaneous TID WC  . insulin aspart  3 Units Subcutaneous TID WC  . metoprolol succinate  12.5 mg Oral QHS  .  multivitamin-lutein  1 capsule Oral Daily  . sodium chloride  3 mL Intravenous Q12H  . vancomycin  1,000 mg Intravenous Q T,Th,Sa-HD   Continuous Infusions:  Antibiotics Given (last 72 hours)    Date/Time Action Medication Dose Rate   12/22/14 0329 Given   ceFEPIme (MAXIPIME) 2 g in dextrose 5 % 50 mL IVPB 2 g 100 mL/hr   12/22/14 0423 Given   vancomycin (VANCOCIN) 2,000 mg in sodium chloride 0.9 % 500 mL IVPB 2,000 mg 250 mL/hr      Principal Problem:   Sepsis Active Problems:   ESRD (end stage renal disease) on dialysis   Seizure disorder   Parkinson's disease   Anemia in chronic kidney disease   Cellulitis of right foot   Decubitus ulcer   Diabetes mellitus type 2, uncontrolled    Time spent: 35 min    Tashea Othman  [facility] Pager [phone]. If 7PM-7AM, please contact night-coverage at [email or web address], password [facility] 12/23/2014, 10:21 AM  LOS: 2 days

## 2014-12-23 NOTE — Progress Notes (Signed)
Pt spouse and next of kin Nakoa Ganus is now at bedside. She is calm and appropriate for situation. Apologizes for her daughter-in-law's behavior and states, "This is not new for her."

## 2014-12-23 NOTE — Progress Notes (Signed)
SLP Cancellation Note  Patient Details Name: [name] [name] MRN: [id number] DOB: 1941-06-15   Cancelled treatment:       Reason Eval/Treat Not Completed: Patient in dialysis.  ST will continue efforts to complete cog/com eval.     Lamar Sprinkles 12/23/2014, 3:28 PM Shelly Flatten, [place], [place] Acute [facility] SLP [phone]

## 2014-12-23 NOTE — Evaluation (Signed)
Physical Therapy Evaluation Patient Details Name: [name] [name] MRN: [id number] DOB: 30-Oct-1940 Today's Date: 12/23/2014   History of Present Illness  Patient is a 74 yo male admitted 12/21/14 from [facility] with confusion and Rt foot gangrene.  PMH:  ESRD on HD, seizures, Parkinson's, DM, sacral decubitus ulcer, anemia, HTN, Lt BKA    Clinical Impression  Patient presents with problems listed below.  Will benefit from acute PT to maximize mobility prior to discharge.  Patient potentially to have surgery on RLE - awaiting family decision.  Recommend patient return to SNF at discharge.    Follow Up Recommendations SNF;Supervision/Assistance - 24 hour    Equipment Recommendations  None recommended by PT    Recommendations for Other Services       Precautions / Restrictions Precautions Precautions: Fall Restrictions Weight Bearing Restrictions: No      Mobility  Bed Mobility Overal bed mobility: Needs Assistance;+2 for physical assistance Bed Mobility: Rolling Rolling: Max assist;+2 for physical assistance         General bed mobility comments: Verbal cues for technique.  Patient unable to initiate movement for rolling due to lethargy.  Requires +2 max assist.  Transfers                    Ambulation/Gait                [name]            Wheelchair Mobility    Modified Rankin (Stroke Patients Only)       Balance                                             Pertinent Vitals/Pain Pain Assessment: Faces Faces Pain Scale: Hurts even more Pain Location: Rt foot/LE Pain Descriptors / Indicators: Grimacing Pain Intervention(s): Limited activity within patient's tolerance    Home Living Family/patient expects to be discharged to:: Skilled nursing facility                      Prior Function Level of Independence: Needs assistance   Gait / Transfers Assistance Needed: Non-ambulatory pta.  Max assist for transfers  only.  ADL's / Homemaking Assistance Needed: Assist for all ADL's  Comments: Wife reports it has been "months" since patient was able to ambulate with prosthesis and RW short distances.     Hand Dominance   Dominant Hand: Right    Extremity/Trunk Assessment   Upper Extremity Assessment: Generalized weakness (Able to move BUE's against gravity)           Lower Extremity Assessment: RLE deficits/detail;LLE deficits/detail RLE Deficits / Details: No initiation of movement to commands ? if due to pain vs weakness LLE Deficits / Details: BKA - able to move LLE against gravity with at least 3/5 strength     Communication   Communication: Other (comment) (Minimal verbalizations - decreased arousal)  Cognition Arousal/Alertness: Lethargic Behavior During Therapy: Flat affect (Arouses for seconds only) Overall Cognitive Status: Difficult to assess                      General Comments      Exercises        Assessment/Plan    PT Assessment Patient needs continued PT services  PT Diagnosis Difficulty walking;Generalized weakness;Acute pain;Altered mental status   PT Problem List  Decreased strength;Decreased activity tolerance;Decreased balance;Decreased mobility;Decreased cognition;Pain;Obesity  PT Treatment Interventions Functional mobility training;Therapeutic activities;Therapeutic exercise;Balance training;Patient/family education;Cognitive remediation   PT Goals (Current goals can be found in the Care Plan section) Acute Rehab PT Goals Patient Stated Goal: Unable to state PT Goal Formulation: With family Time For Goal Achievement: 01/06/15 Potential to Achieve Goals: Fair    Frequency Min 2X/week   Barriers to discharge        Co-evaluation               End of Session   Activity Tolerance: Patient limited by lethargy;Patient limited by pain Patient left: in bed;with call bell/phone within reach;with family/visitor present Nurse Communication:  Mobility status;Need for lift equipment         Time: 9390-3009 PT Time Calculation (min) (ACUTE ONLY): 12 min   Charges:   PT Evaluation $Initial PT Evaluation Tier I: 1 Procedure     PT G Codes:        Despina Pole 01/21/2015, 1:05 PM Carita Pian. Sanjuana Kava, [place] Pager [phone]

## 2014-12-23 NOTE — Progress Notes (Signed)
   VASCULAR SURGERY ASSESSMENT & PLAN:  * The patient seems somewhat confused. I had a long discussion with the family and the patient today. I have explained that the options are to not address the gangrenous right foot and to simply keep him comfortable. The other options would be below-the-knee amputation or above-the-knee amputation. I would favor above-the-knee amputation given the 15% risk of a below-the-knee amputation nonhealing and there is no clear advantage to attempting a BKA on the right given that he will not be able to obtain bilateral prostheses and ambulate.  * The family will discuss this and let us know.   SUBJECTIVE: the patient appears comfortable.  PHYSICAL EXAM: Filed Vitals:   12/23/14 0020 12/23/14 0352 12/23/14 0754 12/23/14 1011  BP:  103/54 109/59 95/53  Pulse: 85 86 86 83  Temp: 100.4 F (38 C) 99.5 F (37.5 C) 100 F (37.8 C) 99.3 F (37.4 C)  TempSrc:  Oral Oral Oral  Resp:  18 18 18   Height:      Weight:      SpO2:  94% 95% 98%   Gangrenous changes of right heel are unchanged.  LABS: Lab Results  Component Value Date   WBC 13.3* 12/22/2014   HGB 9.2* 12/22/2014   HCT 28.5* 12/22/2014   MCV 102.9* 12/22/2014   PLT 336 12/22/2014   Lab Results  Component Value Date   CREATININE 10.68* 12/22/2014   Lab Results  Component Value Date   INR 0.97 05/26/2013   CBG (last 3)   Recent Labs  12/22/14 2137 12/23/14 0749 12/23/14 1141  GLUCAP 219* 228* 235*    Principal Problem:   Sepsis Active Problems:   ESRD (end stage renal disease) on dialysis   Seizure disorder   Parkinson's disease   Anemia in chronic kidney disease   Cellulitis of right foot   Decubitus ulcer   Diabetes mellitus type 2, uncontrolled   Gae Gallop Beeper: [phone] 12/23/2014

## 2014-12-23 NOTE — Progress Notes (Signed)
Patient daughter Ileene [place] at bedside voices concerns about patients altered mental status. Patient is resting in bed, easily aroused. Oriented to person only. Temp is 101.5. M. Lynch NP notified. Will continue to monitor.

## 2014-12-24 DIAGNOSIS — L8990 Pressure ulcer of unspecified site, unspecified stage: Secondary | ICD-10-CM

## 2014-12-24 LAB — GLUCOSE, CAPILLARY
GLUCOSE-CAPILLARY: 167 mg/dL — AB (ref 70–99)
GLUCOSE-CAPILLARY: 214 mg/dL — AB (ref 70–99)
Glucose-Capillary: 253 mg/dL — ABNORMAL HIGH (ref 70–99)
Glucose-Capillary: 165 mg/dL — ABNORMAL HIGH (ref 70–99)

## 2014-12-24 MED ORDER — HYDROMORPHONE HCL 1 MG/ML IJ SOLN
1.0000 mg | INTRAMUSCULAR | Status: DC | PRN
Start: 2014-12-24 — End: 2014-12-25
  Administered 2014-12-24: 2 mg via INTRAVENOUS
  Filled 2014-12-24: qty 2

## 2014-12-24 NOTE — Progress Notes (Signed)
OT Cancellation Note  Patient Details Name: [name] [name] MRN: [id number] DOB: September 22, 1940   Cancelled Treatment:    Reason Eval/Treat Not Completed: OT screened. Pt is from SNF and D/C plan is to return to SNF. No apparent immediate acute care OT needs, therefore will defer OT to SNF. If OT eval is needed please call Acute Rehab Dept. at [phone] or text page OT at [phone].    Benito Mccreedy OTR/L [phone] 12/24/2014, 5:21 PM

## 2014-12-24 NOTE — Progress Notes (Signed)
PROGRESS NOTE  [name] [name] XIP:[id number] DOB: 05-15-1941 DOA: 12/21/2014 PCP: Donetta Potts, MD  Assessment/Plan: Sepsis from right foot cellulitis and sacral decubitus -  -vancomycin and cefepime. - Follow blood cultures. - consulted Dr. Scot Dock- family and patient have decided not to pursue amputation- spoke with wife and son  UTI- proteus -IV abx -await final culture  Acute encephalopathy - most likely secondary to sepsis.  -more awake today- able to tell me we were waiting on a decision about surgery of his leg  ESRD on hemodialysis on Tuesday Thursday and Saturday - consulted nephrology for dialysis.  Parkinson's disease - recently patient's levodopa dose was increased. Continue.  History of seizures - on valproate continue.  Diabetes mellitus type 2 uncontrolled - patient was not on any medications for diabetes.  hemoglobin A1c 10. Patient has been placed on sliding-scale coverage- add lantus  Chronic anemia from ESRD - follow CBC- will not take blood products   Spoke with wife and son regarding code status Will consult palliative care  Code Status: DNR Family Communication: wife at bedside, son on phone-- patient has told the son that he does not want to lose his leg-- has been depressed and "given up" since previous amputation Disposition Plan:   Consultants:  Vascular  palliative care  Procedures:      HPI/Subjective: Will awaken and answer questions but will not respond to the question about whether or not he wants surgery  Objective: Filed Vitals:   12/24/14 0500  BP: 92/51  Pulse: 83  Temp: 98 F (36.7 C)  Resp: 18    Intake/Output Summary (Last 24 hours) at 12/24/14 0955 Last data filed at 12/23/14 1759  Gross per 24 hour  Intake    240 ml  Output   1000 ml  Net   -760 ml   Filed Weights   12/21/14 2303 12/22/14 1525 12/22/14 1814  Weight: 118.434 kg (261 lb 1.6 oz) 118 kg (260 lb 2.3 oz) 116.5 kg (256 lb 13.4 oz)     Exam:   General:  Sleepy but will awaken  Cardiovascular: rrr  Respiratory: diminished  Abdomen: +BS, soft  Musculoskeletal: foul smelling wound   Data Reviewed: Basic Metabolic Panel:  Recent Labs Lab 12/21/14 1350 12/22/14 0320 12/22/14 1530  NA 134* 137 135  K 3.9 4.0 4.0  CL 92* 98 97  CO2 26 25 27   GLUCOSE 334* 260* 249*  BUN 41* 48* 55*  CREATININE 8.59* 9.79* 10.68*  CALCIUM 9.1 9.1 8.8  PHOS  --   --  4.5   Liver Function Tests:  Recent Labs Lab 12/22/14 1530  ALBUMIN 1.9*   No results for input(s): LIPASE, AMYLASE in the last 168 hours. No results for input(s): AMMONIA in the last 168 hours. CBC:  Recent Labs Lab 12/21/14 1350 12/22/14 0320 12/22/14 1530  WBC 15.9* 14.0* 13.3*  NEUTROABS  --  9.7*  --   HGB 11.2* 10.2* 9.2*  HCT 34.0* 31.5* 28.5*  MCV 102.4* 102.6* 102.9*  PLT 354 346 336   Cardiac Enzymes: No results for input(s): CKTOTAL, CKMB, CKMBINDEX, TROPONINI in the last 168 hours. BNP (last 3 results) No results for input(s): BNP in the last 8760 hours.  ProBNP (last 3 results)  Recent Labs  01/15/14 1316  PROBNP 1399.0*    CBG:  Recent Labs Lab 12/23/14 0749 12/23/14 1141 12/23/14 1834 12/23/14 2222 12/24/14 0800  GLUCAP 228* 235* 122* 145* 167*    Recent Results (from the past 240 hour(s))  Urine culture     Status: None   Collection Time: 12/21/14  4:34 PM  Result Value Ref Range Status   Specimen Description URINE, CATHETERIZED  Final   Special Requests Immunocompromised  Final   Colony Count   Final    >=100,000 COLONIES/ML Performed at [facility]    Culture   Final    PROTEUS MIRABILIS Performed at [facility]    Report Status 12/23/2014 FINAL  Final   Organism ID, Bacteria PROTEUS MIRABILIS  Final      Susceptibility   Proteus mirabilis - MIC*    AMPICILLIN <=2 SENSITIVE Sensitive     CEFAZOLIN 8 SENSITIVE Sensitive     CEFTRIAXONE <=1 SENSITIVE Sensitive      CIPROFLOXACIN 1 SENSITIVE Sensitive     GENTAMICIN <=1 SENSITIVE Sensitive     LEVOFLOXACIN 1 SENSITIVE Sensitive     NITROFURANTOIN 128 RESISTANT Resistant     TOBRAMYCIN <=1 SENSITIVE Sensitive     TRIMETH/SULFA <=20 SENSITIVE Sensitive     PIP/TAZO <=4 SENSITIVE Sensitive     * PROTEUS MIRABILIS  Blood culture (routine x 2)     Status: None (Preliminary result)   Collection Time: 12/21/14  4:57 PM  Result Value Ref Range Status   Specimen Description BLOOD RIGHT ANTECUBITAL  Final   Special Requests BOTTLES DRAWN AEROBIC ONLY 2CC  Final   Culture   Final           BLOOD CULTURE RECEIVED NO GROWTH TO DATE CULTURE WILL BE HELD FOR 5 DAYS BEFORE ISSUING A FINAL NEGATIVE REPORT Performed at [facility]    Report Status PENDING  Incomplete  Blood culture (routine x 2)     Status: None (Preliminary result)   Collection Time: 12/21/14  5:09 PM  Result Value Ref Range Status   Specimen Description BLOOD RIGHT HAND  Final   Special Requests BOTTLES DRAWN AEROBIC AND ANAEROBIC 5CC  Final   Culture   Final           BLOOD CULTURE RECEIVED NO GROWTH TO DATE CULTURE WILL BE HELD FOR 5 DAYS BEFORE ISSUING A FINAL NEGATIVE REPORT Performed at [facility]    Report Status PENDING  Incomplete  MRSA PCR Screening     Status: None   Collection Time: 12/22/14 12:32 AM  Result Value Ref Range Status   MRSA by PCR NEGATIVE NEGATIVE Final    Comment:        The GeneXpert MRSA Assay (FDA approved for NASAL specimens only), is one component of a comprehensive MRSA colonization surveillance program. It is not intended to diagnose MRSA infection nor to guide or monitor treatment for MRSA infections.      Studies: No results found.  Scheduled Meds: . aspirin EC  325 mg Oral Daily  . atorvastatin  20 mg Oral QHS  . carbidopa-levodopa  1 tablet Oral TID AC  . ceFEPime (MAXIPIME) IV  2 g Intravenous Q T,Th,Sa-HD  . cholecalciferol  1,000 Units Oral Daily  . cinacalcet  30  mg Oral QPC supper  . divalproex  1,500 mg Oral Daily  . enoxaparin (LOVENOX) injection  30 mg Subcutaneous Q24H  . escitalopram  5 mg Oral Daily  . gabapentin  300 mg Oral QHS  . insulin aspart  0-9 Units Subcutaneous TID WC  . insulin aspart  3 Units Subcutaneous TID WC  . insulin glargine  5 Units Subcutaneous Daily  . metoprolol succinate  12.5 mg Oral QHS  .  multivitamin-lutein  1 capsule Oral Daily  . sodium chloride  3 mL Intravenous Q12H  . vancomycin  1,000 mg Intravenous Q T,Th,Sa-HD   Continuous Infusions:  Antibiotics Given (last 72 hours)    Date/Time Action Medication Dose Rate   12/22/14 0329 Given   ceFEPIme (MAXIPIME) 2 g in dextrose 5 % 50 mL IVPB 2 g 100 mL/hr   12/22/14 0423 Given   vancomycin (VANCOCIN) 2,000 mg in sodium chloride 0.9 % 500 mL IVPB 2,000 mg 250 mL/hr   12/23/14 1624 Given  [given in HD]   ceFEPIme (MAXIPIME) 2 g in dextrose 5 % 50 mL IVPB 2 g 100 mL/hr   12/23/14 1655 Given  [given in HD]   vancomycin (VANCOCIN) IVPB 1000 mg/200 mL premix 1,000 mg 200 mL/hr      Principal Problem:   Sepsis Active Problems:   ESRD (end stage renal disease) on dialysis   Seizure disorder   Parkinson's disease   Anemia in chronic kidney disease   Cellulitis of right foot   Decubitus ulcer   Diabetes mellitus type 2, uncontrolled    Time spent: 25 min    VANN, JESSICA  [facility] Pager [phone]. If 7PM-7AM, please contact night-coverage at [email or web address], password [facility] 12/24/2014, 9:55 AM  LOS: 3 days

## 2014-12-24 NOTE — Progress Notes (Signed)
   VASCULAR SURGERY ASSESSMENT & PLAN:  * Currently resting comfortably and no family is available. As per our discussion with the family yesterday, his last desire when he was discussing this with his son was that he did not want to have any further amputations. This is the case then would recommend  palliative care and comfort measures.  * if the family decided to proceed with amputation, I would recommend above-the-knee amputation as this would be associated with the best chance of healing. 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   SUBJECTIVE: resting comfortably.  PHYSICAL EXAM: Filed Vitals:   12/23/14 1730 12/23/14 1759 12/23/14 2100 12/24/14 0500  BP: 121/71 121/64 144/68 92/51  Pulse: 85 90 92 83  Temp:  99.5 F (37.5 C) 98.5 F (36.9 C) 98 F (36.7 C)  TempSrc:  Oral Axillary Oral  Resp:  18 18 18   Height:      Weight:      SpO2:  98% 96% 96%   No significant cellulitis in leg. Still with drainage and odor from heel wound.  LABS: Lab Results  Component Value Date   WBC 13.3* 12/22/2014   HGB 9.2* 12/22/2014   HCT 28.5* 12/22/2014   MCV 102.9* 12/22/2014   PLT 336 12/22/2014   Lab Results  Component Value Date   CREATININE 10.68* 12/22/2014   Lab Results  Component Value Date   INR 0.97 05/26/2013   CBG (last 3)   Recent Labs  12/23/14 1834 12/23/14 2222 12/24/14 0800  GLUCAP 122* 145* 167*    Principal Problem:   Sepsis Active Problems:   ESRD (end stage renal disease) on dialysis   Seizure disorder   Parkinson's disease   Anemia in chronic kidney disease   Cellulitis of right foot   Decubitus ulcer   Diabetes mellitus type 2, uncontrolled    Gae Gallop Beeper: [phone] 12/24/2014

## 2014-12-24 NOTE — Progress Notes (Addendum)
Subjective:  More alert and talkative, concerned about condition, uncertain whether to proceed with amputation  Objective: Vital signs in last 24 hours: Temp:  [98 F (36.7 C)-99.8 F (37.7 C)] 98 F (36.7 C) (04/10 0500) Pulse Rate:  [82-92] 83 (04/10 0500) Resp:  [13-18] 18 (04/10 0500) BP: (92-144)/(51-71) 92/51 mmHg (04/10 0500) SpO2:  [96 %-98 %] 96 % (04/10 0500) Weight change:   Intake/Output from previous day: 04/09 0701 - 04/10 0700 In: 480 [P.O.:480] Out: 1000  Intake/Output this shift:   Lab Results:  Recent Labs  12/22/14 0320 12/22/14 1530  WBC 14.0* 13.3*  HGB 10.2* 9.2*  HCT 31.5* 28.5*  PLT 346 336   BMET:  Recent Labs  12/22/14 0320 12/22/14 1530  NA 137 135  K 4.0 4.0  CL 98 97  CO2 25 27  GLUCOSE 260* 249*  BUN 48* 55*  CREATININE 9.79* 10.68*  CALCIUM 9.1 8.8  ALBUMIN  --  1.9*   No results for input(s): PTH in the last 72 hours. Iron Studies: No results for input(s): IRON, TIBC, TRANSFERRIN, FERRITIN in the last 72 hours.  Studies/Results: No results found.   EXAM: General Appearance: Alert, in no apparent distress Resp: CTA without rales, rhonchi, or wheezes Cardio: RRR without murmur or rub GI: + BS, soft and nontender Extremities: No edema, malodorous right heel wound with protective boot Access: AVF @ LUA with + bruit  Dialysis Orders: TTS @ [facility] 119 kg 4.5 hrs 400/800 2K/2Ca Profile 3 Heparin 6000 U AVF @ LUA No Hectorol Aranesp 25 mcg on Thurs No Venofer  Assessment/Plan: 1. Sepsis - sec to worsening necrotic R heel wound - would require amputation per surgery. Patient doesn't want amputation. Palliative care consult ordered.    2. ESRD - on HD TTS 3. HTN/volume - BP 92/51 on Metoprolol 12.5 mg qhs; wt 116.5 kg s/p net UF 1 L yesterday, below EDW, CXR negative.  4. Anemia - Hgb down to 9.2 on outpatient Aranesp 25 mcg.  Increase to 40 mcg and give on Tues. 5. Sec HPT  - Ca 8.8 (10.5 corrected), P 4.5; Cholecalciferol 1000 U qd, Sensipar 30 mg qd, no binders. 6. Nutrition - Alb 1.9, renal diet, vitamin. 7. Seizure disorder - on Depakote. 8. Parkinson's - on Sinemet. 9. A-fib - on BB. 10. DM - per primary.    LOS: 3 days   LYLES,CHARLES 12/24/2014,8:33 AM  Pt seen, examined and agree w A/P as above. Patient has refused amputation.  Next HD is due on Tuesday. Will reassess on a daily basis his need / ability to tolerate dialysis while awaiting palliative care input.   Kelly Splinter MD pager [phone]    cell [phone] 12/24/2014, 12:01 PM

## 2014-12-25 DIAGNOSIS — G40909 Epilepsy, unspecified, not intractable, without status epilepticus: Secondary | ICD-10-CM

## 2014-12-25 LAB — GLUCOSE, CAPILLARY: GLUCOSE-CAPILLARY: 229 mg/dL — AB (ref 70–99)

## 2014-12-25 MED ORDER — NALOXONE HCL 0.4 MG/ML IJ SOLN
0.4000 mg | INTRAMUSCULAR | Status: DC | PRN
  Administered 2014-12-25 (×2): 0.4 mg via INTRAVENOUS
  Filled 2014-12-25 (×2): qty 1

## 2014-12-25 MED ORDER — HYDROMORPHONE HCL 1 MG/ML IJ SOLN: 0.5000 mg | INTRAMUSCULAR | Status: DC | PRN

## 2014-12-25 MED ORDER — NALOXONE HCL 0.4 MG/ML IJ SOLN
INTRAMUSCULAR | Status: AC
  Administered 2014-12-25: 0.4 mg
  Filled 2014-12-25: qty 1

## 2014-12-25 MED ORDER — HYDROMORPHONE HCL 1 MG/ML IJ SOLN: 0.5000 mg | INTRAMUSCULAR | Status: AC | PRN

## 2014-12-25 NOTE — Clinical Social Work Note (Addendum)
CSW received consult for residential hospice placement. CSW talked with attending Dr. Eliseo Squires who advised that wife at the bedside and wants a hospice facility versus home with hospice services. CSW talked with Mrs. Joslin to offer choice. She is understandably emotional and talked with CSW about her 2 1/2 year marriage to patient and about her children and his children and some of the challenges she has faced. Mrs. Coin's 1st choice is [facility] and her 2nd choice is [facility].  Call made to Erling Conte, [place] with [facility] and referral made.  CSW informed by Erling Conte, CSW that they can take patient. Harmon Pier will meet with wife this afternoon to complete admissions paperwork. Patient will discharge to [facility] today via ambulance. Wife remains at the bedside.  Iyanna Drummer Givens, MSW, LCSW Licensed Clinical Social Worker [place] [phone]

## 2014-12-25 NOTE — Progress Notes (Signed)
SLP Cancellation Note  Patient Details Name: [name] [name] MRN: [id number] DOB: Jan 05, 1941   Cancelled treatment:       Reason Eval/Treat Not Completed: Fatigue/lethargy limiting ability to participate (Pt had a difficult night with respiratory difficulty.  )   Lamar Sprinkles 12/25/2014, 8:33 AM

## 2014-12-25 NOTE — Progress Notes (Signed)
12/25/2014 11:50 PM  Patient stated that he was in severe pain. Complained about neck, back, bottom and foot all hurting. Per family at bedside he would wake up shouting and moaning about pain. During assessment patient would flinch every time I touched him. Informed the patient and family members that he could get dilaudid if he is hurting that badly. Pain medication given. 2mg  (check eMAR).  Once medication was given patient began to roll eyes and have decreased respiratory rate. Informed NT to obtain vitals. Respirations were 4. Informed MD on call. Called Rapid Response RN.  Rapid response informed me to Narcan the patient (check eMAR).  Rapid was at bedside when Narcan was given. Patient became more alert to his baseline and respirations improved. 2L N/C was also placed on patient. Family was pleased with his respiration status.   MD on call made some order changes to PRN pain medication (check eMAR)  Will continue to assess and monitor the patient every 15 minutes for the next hour and throughout the night.   [employer], RN-BC, [employer] [facility] [address] Phone [phone]

## 2014-12-25 NOTE — Progress Notes (Signed)
[facility] ROUNDING NOTE   Subjective:   Interval History: resting comfortably now DNR status   Objective:  Vital signs in last 24 hours:  Temp:  [98.2 F (36.8 C)-100.1 F (37.8 C)] 98.7 F (37.1 C) (04/11 0330) Pulse Rate:  [79-90] 82 (04/11 0401) Resp:  [6-18] 10 (04/11 0401) BP: (90-110)/(49-60) 105/50 mmHg (04/11 0401) SpO2:  [92 %-100 %] 100 % (04/11 0401)  Weight change:  Filed Weights   12/21/14 2303 12/22/14 1525 12/22/14 1814  Weight: 118.434 kg (261 lb 1.6 oz) 118 kg (260 lb 2.3 oz) 116.5 kg (256 lb 13.4 oz)    Intake/Output: I/O last 3 completed shifts: In: 84 [P.O.:600; I.V.:3] Out: 1 [Urine:1]   Intake/Output this shift:     CVS- RRR RS- CTA ABD- BS present soft non-distended EXT- no edema   malodorous right heel wound with protective boot Access: AVF @ LUA with + bruit   Basic Metabolic Panel:  Recent Labs Lab 12/21/14 1350 12/22/14 0320 12/22/14 1530  NA 134* 137 135  K 3.9 4.0 4.0  CL 92* 98 97  CO2 26 25 27   GLUCOSE 334* 260* 249*  BUN 41* 48* 55*  CREATININE 8.59* 9.79* 10.68*  CALCIUM 9.1 9.1 8.8  PHOS  --   --  4.5    Liver Function Tests:  Recent Labs Lab 12/22/14 1530  ALBUMIN 1.9*   No results for input(s): LIPASE, AMYLASE in the last 168 hours. No results for input(s): AMMONIA in the last 168 hours.  CBC:  Recent Labs Lab 12/21/14 1350 12/22/14 0320 12/22/14 1530  WBC 15.9* 14.0* 13.3*  NEUTROABS  --  9.7*  --   HGB 11.2* 10.2* 9.2*  HCT 34.0* 31.5* 28.5*  MCV 102.4* 102.6* 102.9*  PLT 354 346 336    Cardiac Enzymes: No results for input(s): CKTOTAL, CKMB, CKMBINDEX, TROPONINI in the last 168 hours.  BNP: Invalid input(s): POCBNP  CBG:  Recent Labs Lab 12/23/14 2222 12/24/14 0800 12/24/14 1144 12/24/14 1702 12/24/14 2123  GLUCAP 145* 167* 253* 214* 165*    Microbiology: Results for orders placed or performed during the hospital encounter of 12/21/14  Urine culture      Status: None   Collection Time: 12/21/14  4:34 PM  Result Value Ref Range Status   Specimen Description URINE, CATHETERIZED  Final   Special Requests Immunocompromised  Final   Colony Count   Final    >=100,000 COLONIES/ML Performed at [facility]   Final    PROTEUS MIRABILIS Performed at [facility]    Report Status 12/23/2014 FINAL  Final   Organism ID, Bacteria PROTEUS MIRABILIS  Final      Susceptibility   Proteus mirabilis - MIC*    AMPICILLIN <=2 SENSITIVE Sensitive     CEFAZOLIN 8 SENSITIVE Sensitive     CEFTRIAXONE <=1 SENSITIVE Sensitive     CIPROFLOXACIN 1 SENSITIVE Sensitive     GENTAMICIN <=1 SENSITIVE Sensitive     LEVOFLOXACIN 1 SENSITIVE Sensitive     NITROFURANTOIN 128 RESISTANT Resistant     TOBRAMYCIN <=1 SENSITIVE Sensitive     TRIMETH/SULFA <=20 SENSITIVE Sensitive     PIP/TAZO <=4 SENSITIVE Sensitive     * PROTEUS MIRABILIS  Blood culture (routine x 2)     Status: None (Preliminary result)   Collection Time: 12/21/14  4:57 PM  Result Value Ref Range Status   Specimen Description BLOOD RIGHT ANTECUBITAL  Final   Special Requests BOTTLES DRAWN  AEROBIC ONLY 2CC  Final   Culture   Final           BLOOD CULTURE RECEIVED NO GROWTH TO DATE CULTURE WILL BE HELD FOR 5 DAYS BEFORE ISSUING A FINAL NEGATIVE REPORT Performed at [facility]    Report Status PENDING  Incomplete  Blood culture (routine x 2)     Status: None (Preliminary result)   Collection Time: 12/21/14  5:09 PM  Result Value Ref Range Status   Specimen Description BLOOD RIGHT HAND  Final   Special Requests BOTTLES DRAWN AEROBIC AND ANAEROBIC 5CC  Final   Culture   Final           BLOOD CULTURE RECEIVED NO GROWTH TO DATE CULTURE WILL BE HELD FOR 5 DAYS BEFORE ISSUING A FINAL NEGATIVE REPORT Performed at [facility]    Report Status PENDING  Incomplete  MRSA PCR Screening     Status: None   Collection Time: 12/22/14 12:32 AM  Result Value Ref Range  Status   MRSA by PCR NEGATIVE NEGATIVE Final    Comment:        The GeneXpert MRSA Assay (FDA approved for NASAL specimens only), is one component of a comprehensive MRSA colonization surveillance program. It is not intended to diagnose MRSA infection nor to guide or monitor treatment for MRSA infections.     Coagulation Studies: No results for input(s): LABPROT, INR in the last 72 hours.  Urinalysis: No results for input(s): COLORURINE, LABSPEC, PHURINE, GLUCOSEU, HGBUR, BILIRUBINUR, KETONESUR, PROTEINUR, UROBILINOGEN, NITRITE, LEUKOCYTESUR in the last 72 hours.  Invalid input(s): APPERANCEUR    Imaging: No results found.   Medications:     . insulin aspart  0-9 Units Subcutaneous TID WC  . insulin aspart  3 Units Subcutaneous TID WC  . insulin glargine  5 Units Subcutaneous Daily  . metoprolol succinate  12.5 mg Oral QHS  . sodium chloride  3 mL Intravenous Q12H   acetaminophen, HYDROmorphone (DILAUDID) injection, naLOXone (NARCAN)  injection, senna-docusate, simethicone  Assessment/ Plan:  1. Sepsis - sec to worsening necrotic R heel wound - would require amputation per surgery. Patient doesn't want amputation. Palliative care consult ordered.  2. ESRD - on HD TTS 3. HTN/volume - BP 92/51 on Metoprolol 12.5 mg qhs; wt 116.5 kg s/p net UF 1 L yesterday, below EDW, CXR negative.  4. Anemia - Hgb down to 9.2 on outpatient Aranesp 25 mcg. Increase to 40 mcg and give on Tues. 5. Sec HPT - Ca 8.8 (10.5 corrected), P 4.5; Cholecalciferol 1000 U qd, Sensipar 30 mg qd, no binders. 6. Nutrition - Alb 1.9, renal diet, vitamin. 7. Seizure disorder - on Depakote. 8. Parkinson's - on Sinemet. 9. A-fib - on BB. 10. DM - per primary.  Difficult situation  It appears that palliative care are going to provide options and set goals of care with family and [name] [name]. I shall place order for dialysis tomorrow    LOS: 4 [name] [name] W @TODAY @8 :05 AM

## 2014-12-25 NOTE — Progress Notes (Signed)
12/25/2014 2:06 AM  Reassessed patient along with another RN to confirm respirations were 6. Informed MD on call and Rapid Response. Ordered to given second dose of Narcan (check eMAR). Patient responded to voice and pain stimuli but would not fully awake. Will continue to assess the patient every 15 minutes for another hour and then throughout the night as well.   [employer], RN-BC, [facility] [address]  Phone [phone]

## 2014-12-25 NOTE — Care Management Note (Signed)
CARE MANAGEMENT NOTE 12/25/2014  Patient:  [name] [name],[name] [name]   Account Number:  [id number]  Date Initiated:  12/25/2014  Documentation initiated by:  Hoyte Ziebell  Subjective/Objective Assessment:   CM following pt for progression and d/c planning.     Action/Plan:   Pt for comfort care and transfer to [facility], residential hospice by [place], [place], per family decision.   Anticipated DC Date:  12/25/2014   Anticipated DC Plan:  [place]         Choice offered to / List presented to:             Status of service:  Completed, signed off Medicare Important Message given?  YES (If response is "NO", the following Medicare IM given date fields will be blank) Date Medicare IM given:  12/25/2014 Medicare IM given by:  Ashlye Oviedo Date Additional Medicare IM given:   Additional Medicare IM given by:    Discharge Disposition:  [place]  Per UR Regulation:    If discussed at Long Length of Stay Meetings, dates discussed:    Comments:

## 2014-12-25 NOTE — Progress Notes (Signed)
PROGRESS NOTE  [name] [name] CHY:[id number] DOB: 1940/10/30 DOA: 12/21/2014 PCP: Donetta Potts, MD  Assessment/Plan: Patient is now comfort care  Sepsis from right foot cellulitis and sacral decubitus -  - consulted Dr. Scot Dock- family and patient have decided not to pursue amputation- spoke with wife and son patient is now comfort care  UTI- proteus abx d/c'd  Acute encephalopathy - most likely secondary to sepsis.  -focus is now on comfort  ESRD on hemodialysis on Tuesday Thursday and Saturday - no more dialysis as focus is on comfort  Parkinson's disease -comfort  History of seizures - on valproate continue.  Diabetes mellitus type 2 uncontrolled - focus is comfort  Chronic anemia from ESRD - follow CBC- will not take blood products   Code Status: DNR Family Communication: wife at bedside, son on phone-- both agreeable that dialysis is not an option as is surgery and focus is now on comfort Disposition Plan:   Consultants:  Vascular  renal  Procedures:      HPI/Subjective: Resting- more confused this AM  Objective: Filed Vitals:   12/25/14 0816  BP: 108/59  Pulse: 81  Temp: 98.1 F (36.7 C)  Resp: 13    Intake/Output Summary (Last 24 hours) at 12/25/14 1054 Last data filed at 12/25/14 0905  Gross per 24 hour  Intake    268 ml  Output      1 ml  Net    267 ml   Filed Weights   12/21/14 2303 12/22/14 1525 12/22/14 1814  Weight: 118.434 kg (261 lb 1.6 oz) 118 kg (260 lb 2.3 oz) 116.5 kg (256 lb 13.4 oz)    Exam:   General:  Sleepy but will awaken  Cardiovascular: rrr  Respiratory: diminished  Abdomen: +BS, soft  Musculoskeletal: foul smelling wound   Data Reviewed: Basic Metabolic Panel:  Recent Labs Lab 12/21/14 1350 12/22/14 0320 12/22/14 1530  NA 134* 137 135  K 3.9 4.0 4.0  CL 92* 98 97  CO2 26 25 27   GLUCOSE 334* 260* 249*  BUN 41* 48* 55*  CREATININE 8.59* 9.79* 10.68*  CALCIUM 9.1 9.1 8.8  PHOS  --   --   4.5   Liver Function Tests:  Recent Labs Lab 12/22/14 1530  ALBUMIN 1.9*   No results for input(s): LIPASE, AMYLASE in the last 168 hours. No results for input(s): AMMONIA in the last 168 hours. CBC:  Recent Labs Lab 12/21/14 1350 12/22/14 0320 12/22/14 1530  WBC 15.9* 14.0* 13.3*  NEUTROABS  --  9.7*  --   HGB 11.2* 10.2* 9.2*  HCT 34.0* 31.5* 28.5*  MCV 102.4* 102.6* 102.9*  PLT 354 346 336   Cardiac Enzymes: No results for input(s): CKTOTAL, CKMB, CKMBINDEX, TROPONINI in the last 168 hours. BNP (last 3 results) No results for input(s): BNP in the last 8760 hours.  ProBNP (last 3 results)  Recent Labs  01/15/14 1316  PROBNP 1399.0*    CBG:  Recent Labs Lab 12/24/14 0800 12/24/14 1144 12/24/14 1702 12/24/14 2123 12/25/14 0805  GLUCAP 167* 253* 214* 165* 229*    Recent Results (from the past 240 hour(s))  Urine culture     Status: None   Collection Time: 12/21/14  4:34 PM  Result Value Ref Range Status   Specimen Description URINE, CATHETERIZED  Final   Special Requests Immunocompromised  Final   Colony Count   Final    >=100,000 COLONIES/ML Performed at [facility]   Final  PROTEUS MIRABILIS Performed at [facility]    Report Status 12/23/2014 FINAL  Final   Organism ID, Bacteria PROTEUS MIRABILIS  Final      Susceptibility   Proteus mirabilis - MIC*    AMPICILLIN <=2 SENSITIVE Sensitive     CEFAZOLIN 8 SENSITIVE Sensitive     CEFTRIAXONE <=1 SENSITIVE Sensitive     CIPROFLOXACIN 1 SENSITIVE Sensitive     GENTAMICIN <=1 SENSITIVE Sensitive     LEVOFLOXACIN 1 SENSITIVE Sensitive     NITROFURANTOIN 128 RESISTANT Resistant     TOBRAMYCIN <=1 SENSITIVE Sensitive     TRIMETH/SULFA <=20 SENSITIVE Sensitive     PIP/TAZO <=4 SENSITIVE Sensitive     * PROTEUS MIRABILIS  Blood culture (routine x 2)     Status: None (Preliminary result)   Collection Time: 12/21/14  4:57 PM  Result Value Ref Range Status   Specimen  Description BLOOD RIGHT ANTECUBITAL  Final   Special Requests BOTTLES DRAWN AEROBIC ONLY 2CC  Final   Culture   Final           BLOOD CULTURE RECEIVED NO GROWTH TO DATE CULTURE WILL BE HELD FOR 5 DAYS BEFORE ISSUING A FINAL NEGATIVE REPORT Performed at [facility]    Report Status PENDING  Incomplete  Blood culture (routine x 2)     Status: None (Preliminary result)   Collection Time: 12/21/14  5:09 PM  Result Value Ref Range Status   Specimen Description BLOOD RIGHT HAND  Final   Special Requests BOTTLES DRAWN AEROBIC AND ANAEROBIC 5CC  Final   Culture   Final           BLOOD CULTURE RECEIVED NO GROWTH TO DATE CULTURE WILL BE HELD FOR 5 DAYS BEFORE ISSUING A FINAL NEGATIVE REPORT Performed at [facility]    Report Status PENDING  Incomplete  MRSA PCR Screening     Status: None   Collection Time: 12/22/14 12:32 AM  Result Value Ref Range Status   MRSA by PCR NEGATIVE NEGATIVE Final    Comment:        The GeneXpert MRSA Assay (FDA approved for NASAL specimens only), is one component of a comprehensive MRSA colonization surveillance program. It is not intended to diagnose MRSA infection nor to guide or monitor treatment for MRSA infections.      Studies: No results found.  Scheduled Meds: . sodium chloride  3 mL Intravenous Q12H   Continuous Infusions:  Antibiotics Given (last 72 hours)    Date/Time Action Medication Dose Rate   12/23/14 1624 Given  [given in HD]   ceFEPIme (MAXIPIME) 2 g in dextrose 5 % 50 mL IVPB 2 g 100 mL/hr   12/23/14 1655 Given  [given in HD]   vancomycin (VANCOCIN) IVPB 1000 mg/200 mL premix 1,000 mg 200 mL/hr      Principal Problem:   Sepsis Active Problems:   ESRD (end stage renal disease) on dialysis   Seizure disorder   Parkinson's disease   Anemia in chronic kidney disease   Cellulitis of right foot   Decubitus ulcer   Diabetes mellitus type 2, uncontrolled    Time spent: 35 min    [place]  [facility] Pager [phone]. If 7PM-7AM, please contact night-coverage at [email or web address], password [facility] 12/25/2014, 10:54 AM  LOS: 4 days

## 2014-12-25 NOTE — Progress Notes (Signed)
Report called to [facility].   Joellen Jersey, RN.

## 2014-12-25 NOTE — Progress Notes (Signed)
VASCULAR SURGERY  The family has decided not to pursue amputation. Vascular surgery will be available as needed.  Deitra Mayo, MD, [place] [phone] Office: [phone]

## 2014-12-25 NOTE — Discharge Summary (Signed)
Physician Discharge Summary  Wilmer Santillo RXV:[id number] DOB: September 20, 1940 DOA: 12/21/2014  PCP: Donetta Potts, MD  Admit date: 12/21/2014 Discharge date: 12/25/2014  Time spent: 35 minutes  Recommendations for Outpatient Follow-up:  1. To [place]  Discharge Diagnoses:  Principal Problem:   Sepsis Active Problems:   ESRD (end stage renal disease) on dialysis   Seizure disorder   Parkinson's disease   Anemia in chronic kidney disease   Cellulitis of right foot   Decubitus ulcer   Diabetes mellitus type 2, uncontrolled   Discharge Condition: terminal  Diet recommendation: as tolerated  Filed Weights   12/21/14 2303 12/22/14 1525 12/22/14 1814  Weight: 118.434 kg (261 lb 1.6 oz) 118 kg (260 lb 2.3 oz) 116.5 kg (256 lb 13.4 oz)    History of present illness:  [name] [name] is a 74 y.o. male with no history of ESRD on hemodialysis, seizure disorder, tremor/Parkinson's disease, diabetes mellitus type 2, chronic anemia, hypertension, hyperlipidemia who was brought from the nursing home after patient was found to be having increasing confusion. As per patient and wife patient was started on antibiotics for possible pneumonia 3 days ago. Over the last few days patient was found to be having increasing confusion and also was found to have some slurred speech. Slurred speech is not new as patient was having it for last few weeks as per the patient. Patient was found to be hypotensive and lactic acid was found to be mildly elevated. Patient on exam was found to be confused off and on. CT of the head did not show anything acute. Patient's right lower extremity wound has been found by increasing discharge and with eschar. Patient also has a decubitus ulcer on the coccygeal area. Patient's Parkinson's disease was recently increased and patient is on valproate for seizures. Patient has been admitted for sepsis most likely from cellulitis of the right lower extremity.   Hospital Course:   Patient is now comfort care  Sepsis from right foot cellulitis and sacral decubitus -  - consulted Dr. Scot Dock- family and patient have decided not to pursue amputation- spoke with wife and son patient is now comfort care  UTI- proteus abx d/c'd  Acute encephalopathy - most likely secondary to sepsis.  -focus is now on comfort  ESRD on hemodialysis on Tuesday Thursday and Saturday - no more dialysis as focus is on comfort  Parkinson's disease -comfort  History of seizures - on valproate continue.  Diabetes mellitus type 2 uncontrolled - focus is comfort  Chronic anemia from ESRD - follow CBC- will not take blood products  Procedures:    Consultations:    Discharge Exam: Filed Vitals:   12/25/14 0816  BP: 108/59  Pulse: 81  Temp: 98.1 F (36.7 C)  Resp: 13     Discharge Instructions    Current Discharge Medication List    START taking these medications   Details  HYDROmorphone (DILAUDID) 1 MG/ML injection Inject 0.5 mLs (0.5 mg total) into the vein every 2 (two) hours as needed for moderate pain or severe pain. Qty: 1 mL, Refills: 0      CONTINUE these medications which have NOT CHANGED   Details  acetaminophen (TYLENOL) 325 MG tablet Take 650 mg by mouth every 6 (six) hours as needed for moderate pain.      STOP taking these medications     atorvastatin (LIPITOR) 20 MG tablet      B Complex-C-Folic Acid (NEPHROCAPS PO)      carbidopa-levodopa (SINEMET IR)  25-100 MG per tablet      Cholecalciferol (VITAMIN D3) 1000 UNITS CAPS      cinacalcet (SENSIPAR) 30 MG tablet      divalproex (DEPAKOTE ER) 500 MG 24 hr tablet      escitalopram (LEXAPRO) 5 MG tablet      gabapentin (NEURONTIN) 300 MG capsule      Menthol, Topical Analgesic, 4 % GEL      metoprolol succinate (TOPROL-XL) 25 MG 24 hr tablet      Multiple Vitamins-Minerals (DECUBI-VITE) CAPS      nitroGLYCERIN (NITROSTAT) 0.4 MG SL tablet      simethicone (MYLICON) 80 MG chewable  tablet      aspirin EC 325 MG tablet      darbepoetin (ARANESP) 60 MCG/0.3ML SOLN injection      oxyCODONE-acetaminophen (PERCOCET/ROXICET) 5-325 MG per tablet        No Known Allergies    The results of significant diagnostics from this hospitalization (including imaging, microbiology, ancillary and laboratory) are listed below for reference.    Significant Diagnostic Studies: Ct Head Wo Contrast  12/21/2014   CLINICAL DATA:  Hypotension following dialysis, increased weakness  EXAM: CT HEAD WITHOUT CONTRAST  TECHNIQUE: Contiguous axial images were obtained from the base of the skull through the vertex without intravenous contrast.  COMPARISON:  05/19/2014  FINDINGS: Mild atrophic changes are noted. Encephalomalacia changes are noted in the right temporal lobe related to prior surgery. Calvarial defects are noted on the right related to the prior surgery as well. Mild chronic white matter ischemic change is seen. No findings to suggest acute hemorrhage, acute infarction or space-occupying mass lesion are noted. Some motion artifact is noted on some of the images.  IMPRESSION: Chronic atrophic and ischemic changes.  Postoperative encephalomalacia on the right as described.  No acute abnormality is seen.   Electronically Signed   By: Inez Catalina M.D.   On: 12/21/2014 18:46   Dg Chest Portable 1 View  12/21/2014   CLINICAL DATA:  Hypotension, history hypertension, diabetes, CHF, brain tumor, end-stage renal disease  EXAM: PORTABLE CHEST - 1 VIEW  COMPARISON:  Portable exam 1723 hours compared to 05/23/2014  FINDINGS: Borderline enlargement of cardiac silhouette.  Mediastinal contours and pulmonary vascularity normal.  Low lung volumes with bibasilar atelectasis.  No definite infiltrate, pleural effusion or pneumothorax.  IMPRESSION: Low lung volumes with bibasilar atelectasis.   Electronically Signed   By: Lavonia Dana M.D.   On: 12/21/2014 17:37   Dg Foot 2 Views Right  12/21/2014   CLINICAL DATA:   Large open sore to the right heel. Diabetes. Cellulitis.  EXAM: RIGHT FOOT - 2 VIEW  COMPARISON:  06/14/2013  FINDINGS: Subcutaneous soft tissue emphysema over the plantar aspect of the posterior right calcaneus consistent with history of ulceration. No bone erosion is identified to suggest evidence of osteomyelitis. Diffuse soft tissue edema. Prominent diffuse vascular calcification. Degenerative changes in the interphalangeal joints, first metatarsal-phalangeal joint, and intertarsal joints. Diffuse bone demineralization. No acute fracture or dislocation.  IMPRESSION: Diffuse soft tissue edema. Subcutaneous gas over the posterior calcaneus consistent with history of cellulitis and ulceration. No bone changes to suggest osteomyelitis.   Electronically Signed   By: Lucienne Capers M.D.   On: 12/21/2014 21:04    Microbiology: Recent Results (from the past 240 hour(s))  Urine culture     Status: None   Collection Time: 12/21/14  4:34 PM  Result Value Ref Range Status   Specimen Description URINE, CATHETERIZED  Final   Special Requests Immunocompromised  Final   Colony Count   Final    >=100,000 COLONIES/ML Performed at [facility]    Culture   Final    PROTEUS MIRABILIS Performed at [facility]    Report Status 12/23/2014 FINAL  Final   Organism ID, Bacteria PROTEUS MIRABILIS  Final      Susceptibility   Proteus mirabilis - MIC*    AMPICILLIN <=2 SENSITIVE Sensitive     CEFAZOLIN 8 SENSITIVE Sensitive     CEFTRIAXONE <=1 SENSITIVE Sensitive     CIPROFLOXACIN 1 SENSITIVE Sensitive     GENTAMICIN <=1 SENSITIVE Sensitive     LEVOFLOXACIN 1 SENSITIVE Sensitive     NITROFURANTOIN 128 RESISTANT Resistant     TOBRAMYCIN <=1 SENSITIVE Sensitive     TRIMETH/SULFA <=20 SENSITIVE Sensitive     PIP/TAZO <=4 SENSITIVE Sensitive     * PROTEUS MIRABILIS  Blood culture (routine x 2)     Status: None (Preliminary result)   Collection Time: 12/21/14  4:57 PM  Result Value Ref Range  Status   Specimen Description BLOOD RIGHT ANTECUBITAL  Final   Special Requests BOTTLES DRAWN AEROBIC ONLY 2CC  Final   Culture   Final           BLOOD CULTURE RECEIVED NO GROWTH TO DATE CULTURE WILL BE HELD FOR 5 DAYS BEFORE ISSUING A FINAL NEGATIVE REPORT Performed at [facility]    Report Status PENDING  Incomplete  Blood culture (routine x 2)     Status: None (Preliminary result)   Collection Time: 12/21/14  5:09 PM  Result Value Ref Range Status   Specimen Description BLOOD RIGHT HAND  Final   Special Requests BOTTLES DRAWN AEROBIC AND ANAEROBIC 5CC  Final   Culture   Final           BLOOD CULTURE RECEIVED NO GROWTH TO DATE CULTURE WILL BE HELD FOR 5 DAYS BEFORE ISSUING A FINAL NEGATIVE REPORT Performed at [facility]    Report Status PENDING  Incomplete  MRSA PCR Screening     Status: None   Collection Time: 12/22/14 12:32 AM  Result Value Ref Range Status   MRSA by PCR NEGATIVE NEGATIVE Final    Comment:        The GeneXpert MRSA Assay (FDA approved for NASAL specimens only), is one component of a comprehensive MRSA colonization surveillance program. It is not intended to diagnose MRSA infection nor to guide or monitor treatment for MRSA infections.      Labs: Basic Metabolic Panel:  Recent Labs Lab 12/21/14 1350 12/22/14 0320 12/22/14 1530  NA 134* 137 135  K 3.9 4.0 4.0  CL 92* 98 97  CO2 26 25 27   GLUCOSE 334* 260* 249*  BUN 41* 48* 55*  CREATININE 8.59* 9.79* 10.68*  CALCIUM 9.1 9.1 8.8  PHOS  --   --  4.5   Liver Function Tests:  Recent Labs Lab 12/22/14 1530  ALBUMIN 1.9*   No results for input(s): LIPASE, AMYLASE in the last 168 hours. No results for input(s): AMMONIA in the last 168 hours. CBC:  Recent Labs Lab 12/21/14 1350 12/22/14 0320 12/22/14 1530  WBC 15.9* 14.0* 13.3*  NEUTROABS  --  9.7*  --   HGB 11.2* 10.2* 9.2*  HCT 34.0* 31.5* 28.5*  MCV 102.4* 102.6* 102.9*  PLT 354 346 336   Cardiac Enzymes: No  results for input(s): CKTOTAL, CKMB, CKMBINDEX, TROPONINI in the last 168  hours. BNP: BNP (last 3 results) No results for input(s): BNP in the last 8760 hours.  ProBNP (last 3 results)  Recent Labs  01/15/14 1316  PROBNP 1399.0*    CBG:  Recent Labs Lab 12/24/14 0800 12/24/14 1144 12/24/14 1702 12/24/14 2123 12/25/14 0805  GLUCAP 167* 253* 214* 165* 229*       Signed:  Dennie Vecchio  [facility] 12/25/2014, 12:32 PM

## 2014-12-25 NOTE — Significant Event (Signed)
Rapid Response Event Note Called by RN to see pt for resp depression after a dose of dilaudid. Overview: Time Called: 0001 Arrival Time: 0004 Event Type: Respiratory  Initial Focused Assessment: On assessment pt RR 4, 103/54, HR 86, 98% on 2L.  Instructed RN to give Narcan 0.4mg  x1 based on RRT protocol.  RR increased to 22 & pt became alert & was speaking & responding appropriately.  Will cont. To monitor for resp. Depression.  Interventions: Narcan 0.4mg  IV x1  Event Summary: [name] Blinks, NP   at   0030    at    Outcome: Stayed in room and stabalized     [name] [name]

## 2014-12-25 NOTE — Progress Notes (Signed)
12/25/2014 3:02 AM  On call MD (K.Schorr NP) at bedside to assess patient. Respirations per her assessment were a 7. Ordered more Narcan (check eMAR).   Patient alert to voice stimuli by opening his eyes and stated that he was very tired.   New orders given to complete vital signs every thirty minutes X2 and page MD with results.  Will continue to assess and monitor the patient throughout the night.  [employer], RN-BC, [facility] Phone [phone]

## 2014-12-27 LAB — CULTURE, BLOOD (ROUTINE X 2)
Culture: NO GROWTH
Culture: NO GROWTH

## 2015-01-14 DEATH — deceased

## 2015-02-05 ENCOUNTER — Ambulatory Visit: Payer: Medicare Other | Admitting: Diagnostic Neuroimaging

## 2015-03-06 ENCOUNTER — Encounter: Payer: Self-pay | Admitting: Vascular Surgery

## 2015-03-09 ENCOUNTER — Encounter (HOSPITAL_COMMUNITY): Payer: Medicare Other

## 2015-03-09 ENCOUNTER — Ambulatory Visit: Payer: Medicare Other | Admitting: Vascular Surgery

## 2015-03-09 ENCOUNTER — Encounter: Payer: Medicare Other | Admitting: Vascular Surgery

## 2015-03-15 ENCOUNTER — Telehealth: Payer: Self-pay | Admitting: Vascular Surgery

## 2015-12-07 IMAGING — CT CT HEAD W/O CM
1 of 2 series · 16 of 30 positions shown, 20 images · non-contrast
Comparison: 05/19/2014

CLINICAL DATA: Hypotension following dialysis, increased weakness

EXAM:
CT HEAD WITHOUT CONTRAST
TECHNIQUE: Contiguous axial images were obtained from the base of the skull
through the vertex without intravenous contrast.

[Series 2: head 5.0 h30s · axial · 0.45mm/px · z∈[+143,+298]mm · 16 of 35 slices shown, 20 images]
[im 2/35  brain]
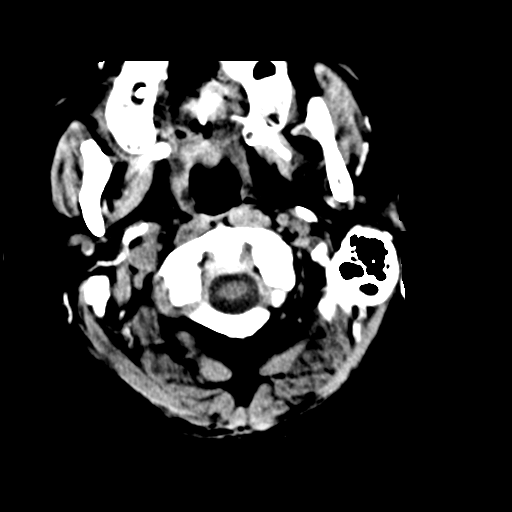
[im 2/35  bone]
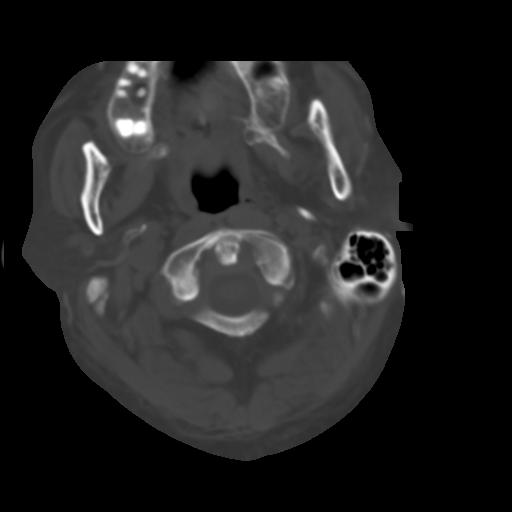
[im 4/35  brain]
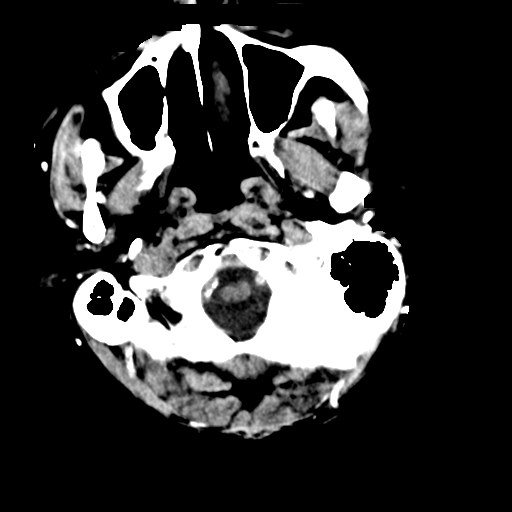
[im 6/35  brain]
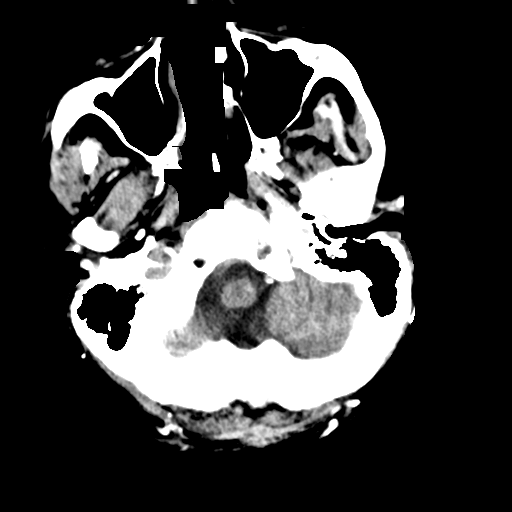
[im 9/35  brain]
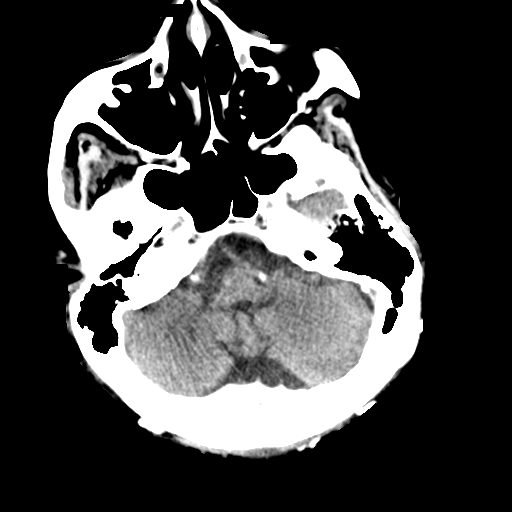
[im 11/35  brain]
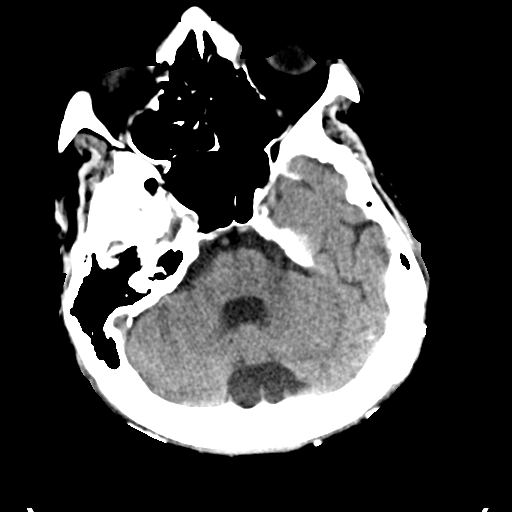
[im 11/35  bone]
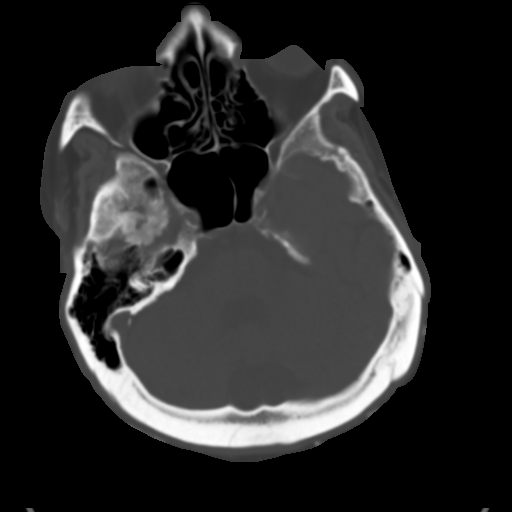
[im 12/35  brain]
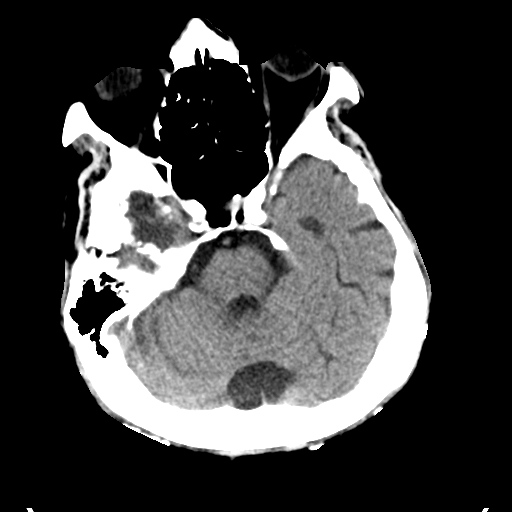
[im 14/35  brain]
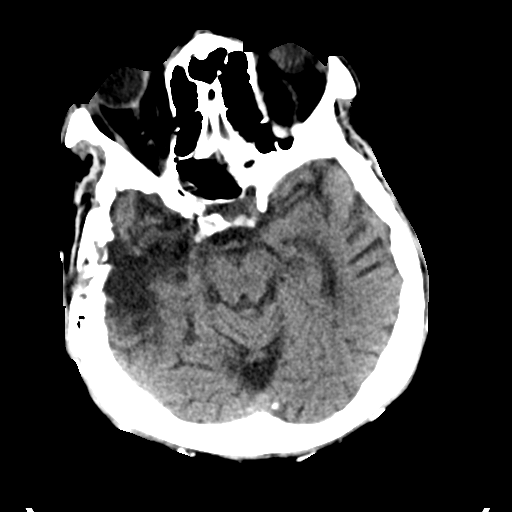
[im 16/35  brain]
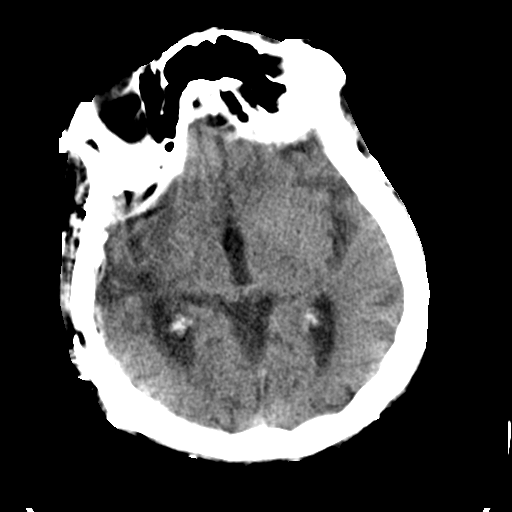
[im 19/35  brain]
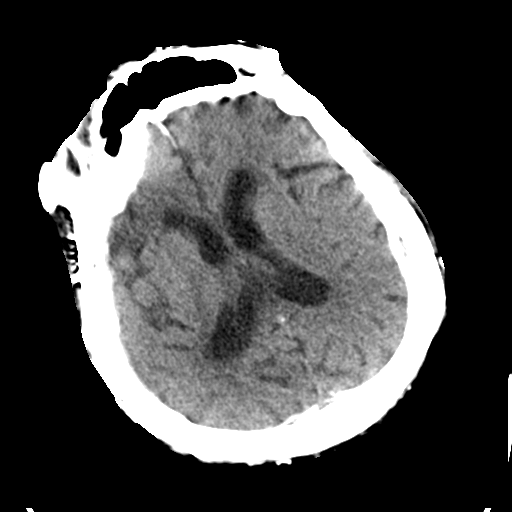
[im 19/35  bone]
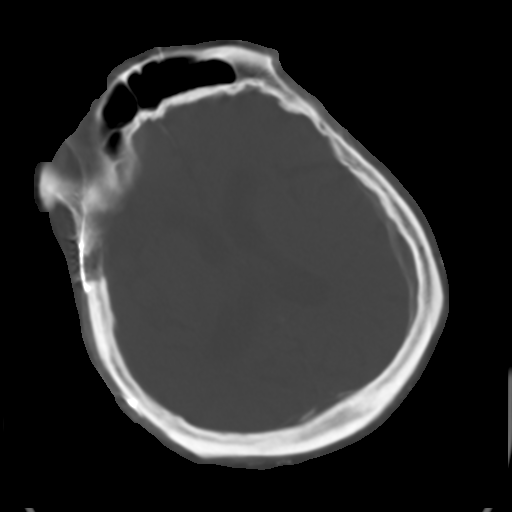
[im 21/35  brain]
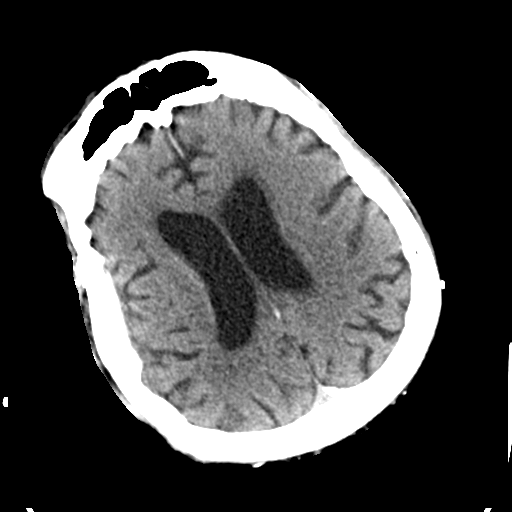
[im 23/35  brain]
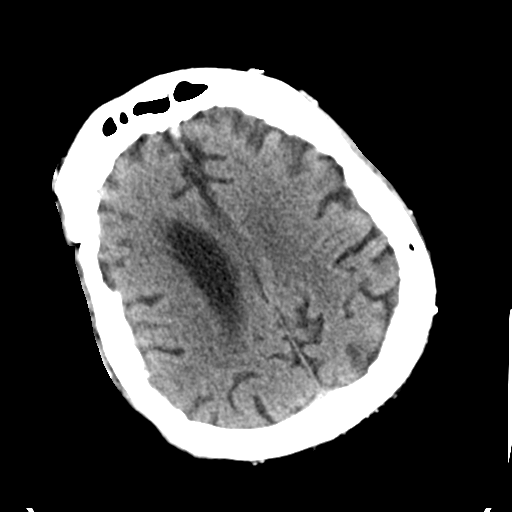
[im 24/35  brain]
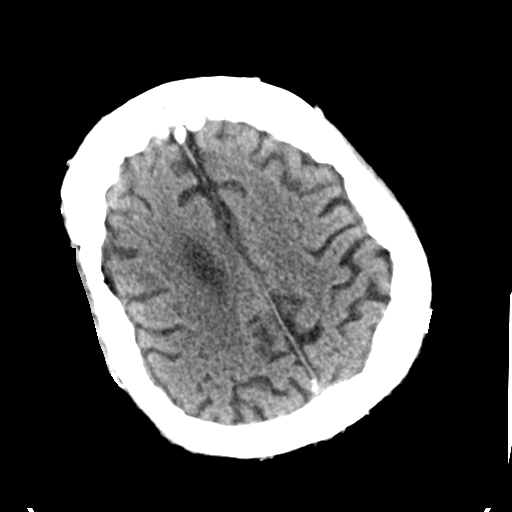
[im 26/35  brain]
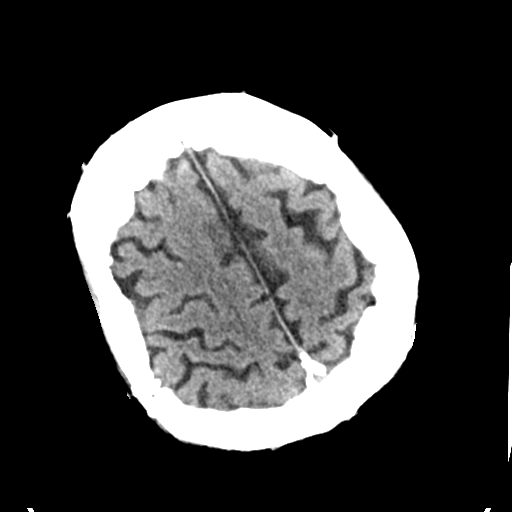
[im 26/35  bone]
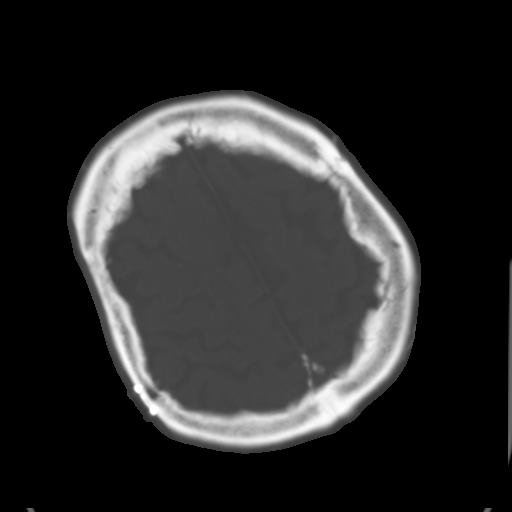
[im 29/35  brain]
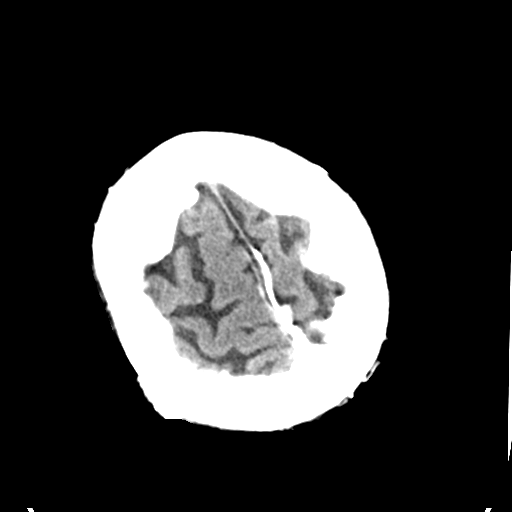
[im 31/35  brain]
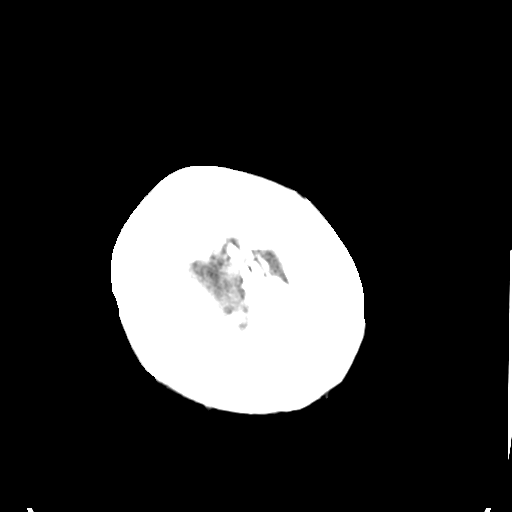
[im 33/35  brain]
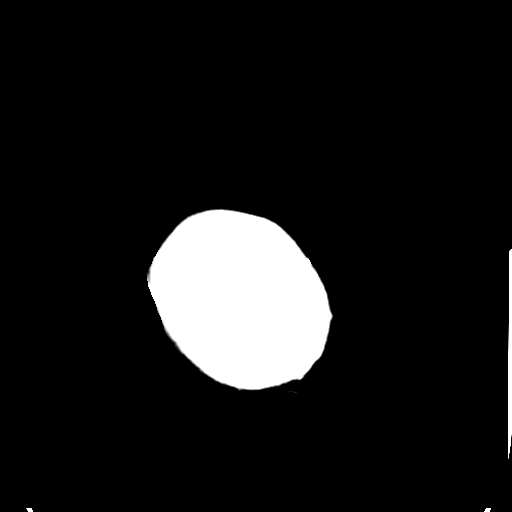

[16 of 30 positions shown; findings below may reference images not displayed]

FINDINGS: Mild atrophic changes are noted. Encephalomalacia changes are noted
in the right temporal lobe related to prior surgery. Calvarial
defects are noted on the right related to the prior surgery as well.
Mild chronic white matter ischemic change is seen. No findings to
suggest acute hemorrhage, acute infarction or space-occupying mass
lesion are noted. Some motion artifact is noted on some of the
images.
IMPRESSION: Chronic atrophic and ischemic changes.

Postoperative encephalomalacia on the right as described.

No acute abnormality is seen.
